# Patient Record
Sex: Female | Born: 1955 | Race: White | Hispanic: No | Marital: Married
Health system: Midwestern US, Community
[De-identification: ages and names within clinical notes are randomized; demographics above are authoritative.]

## PROBLEM LIST (undated history)

## (undated) DIAGNOSIS — K769 Liver disease, unspecified: Secondary | ICD-10-CM

## (undated) DIAGNOSIS — C7931 Secondary malignant neoplasm of brain: Secondary | ICD-10-CM

## (undated) DIAGNOSIS — C439 Malignant melanoma of skin, unspecified: Secondary | ICD-10-CM

## (undated) DIAGNOSIS — I10 Essential (primary) hypertension: Secondary | ICD-10-CM

## (undated) DIAGNOSIS — S83282A Other tear of lateral meniscus, current injury, left knee, initial encounter: Secondary | ICD-10-CM

## (undated) DIAGNOSIS — M545 Low back pain: Secondary | ICD-10-CM

## (undated) DIAGNOSIS — R1010 Upper abdominal pain, unspecified: Secondary | ICD-10-CM

## (undated) DIAGNOSIS — F329 Major depressive disorder, single episode, unspecified: Secondary | ICD-10-CM

## (undated) DIAGNOSIS — K250 Acute gastric ulcer with hemorrhage: Secondary | ICD-10-CM

## (undated) DIAGNOSIS — D649 Anemia, unspecified: Secondary | ICD-10-CM

## (undated) DIAGNOSIS — Z9884 Bariatric surgery status: Secondary | ICD-10-CM

## (undated) DIAGNOSIS — Z87442 Personal history of urinary calculi: Secondary | ICD-10-CM

## (undated) DIAGNOSIS — F411 Generalized anxiety disorder: Secondary | ICD-10-CM

## (undated) DIAGNOSIS — F32A Depression, unspecified: Secondary | ICD-10-CM

## (undated) DIAGNOSIS — M1990 Unspecified osteoarthritis, unspecified site: Secondary | ICD-10-CM

## (undated) DIAGNOSIS — N200 Calculus of kidney: Secondary | ICD-10-CM

## (undated) DIAGNOSIS — Z8719 Personal history of other diseases of the digestive system: Secondary | ICD-10-CM

## (undated) DIAGNOSIS — B019 Varicella without complication: Secondary | ICD-10-CM

## (undated) DIAGNOSIS — D509 Iron deficiency anemia, unspecified: Secondary | ICD-10-CM

## (undated) DIAGNOSIS — Z9289 Personal history of other medical treatment: Secondary | ICD-10-CM

## (undated) DIAGNOSIS — E669 Obesity, unspecified: Secondary | ICD-10-CM

## (undated) DIAGNOSIS — R12 Heartburn: Secondary | ICD-10-CM

## (undated) HISTORY — PX: CERVICAL SPINE SURGERY: SHX589

## (undated) HISTORY — DX: Personal history of other diseases of the digestive system: Z87.19

## (undated) HISTORY — DX: Personal history of other medical treatment: Z92.89

## (undated) HISTORY — DX: Unspecified osteoarthritis, unspecified site: M19.90

## (undated) HISTORY — DX: Varicella without complication: B01.9

## (undated) HISTORY — DX: Calculus of kidney: N20.0

## (undated) HISTORY — PX: OTHER SURGICAL HISTORY: SHX169

## (undated) HISTORY — PX: TONSILLECTOMY AND ADENOIDECTOMY: SUR1326

## (undated) HISTORY — PX: OOPHORECTOMY: SHX86

## (undated) HISTORY — PX: CHOLECYSTECTOMY: SHX55

## (undated) HISTORY — DX: Heartburn: R12

## (undated) HISTORY — DX: Depression, unspecified: F32.A

## (undated) HISTORY — DX: Morbid (severe) obesity due to excess calories: E66.01

## (undated) HISTORY — DX: Major depressive disorder, single episode, unspecified: F32.9

## (undated) HISTORY — DX: Iron deficiency anemia, unspecified: D50.9

---

## 1995-12-22 HISTORY — PX: ABDOMINAL HYSTERECTOMY: SHX81

## 2005-04-30 ENCOUNTER — Ambulatory Visit: Payer: Self-pay | Admitting: Internal Medicine

## 2005-05-13 ENCOUNTER — Ambulatory Visit: Payer: Self-pay | Admitting: Internal Medicine

## 2005-08-04 ENCOUNTER — Ambulatory Visit: Payer: Self-pay | Admitting: Internal Medicine

## 2005-08-10 ENCOUNTER — Ambulatory Visit: Payer: Self-pay | Admitting: Internal Medicine

## 2005-12-21 HISTORY — PX: GASTRIC BYPASS: SHX52

## 2006-04-14 ENCOUNTER — Encounter: Admission: RE | Admit: 2006-04-14 | Discharge: 2006-05-20 | Payer: Self-pay | Admitting: *Deleted

## 2006-07-09 ENCOUNTER — Encounter: Admission: RE | Admit: 2006-07-09 | Discharge: 2006-07-09 | Payer: Self-pay | Admitting: *Deleted

## 2007-02-25 ENCOUNTER — Ambulatory Visit: Payer: Self-pay | Admitting: Internal Medicine

## 2007-04-22 ENCOUNTER — Ambulatory Visit: Payer: Self-pay | Admitting: General Surgery

## 2007-04-22 HISTORY — PX: COLONOSCOPY: SHX174

## 2007-04-28 ENCOUNTER — Ambulatory Visit: Payer: Self-pay | Admitting: Internal Medicine

## 2007-12-22 HISTORY — PX: CERVICAL SPINE SURGERY: SHX589

## 2008-05-11 ENCOUNTER — Emergency Department: Payer: Self-pay | Admitting: Emergency Medicine

## 2009-07-31 ENCOUNTER — Inpatient Hospital Stay: Payer: Self-pay | Admitting: General Surgery

## 2009-09-20 ENCOUNTER — Emergency Department: Payer: Self-pay

## 2009-12-21 HISTORY — PX: HERNIA REPAIR: SHX51

## 2010-12-27 ENCOUNTER — Ambulatory Visit: Payer: Self-pay | Admitting: General Practice

## 2010-12-31 ENCOUNTER — Ambulatory Visit (HOSPITAL_COMMUNITY)
Admission: RE | Admit: 2010-12-31 | Discharge: 2011-01-01 | Payer: Self-pay | Source: Home / Self Care | Attending: Neurosurgery | Admitting: Neurosurgery

## 2011-01-05 LAB — BASIC METABOLIC PANEL
BUN: 11 mg/dL (ref 6–23)
CO2: 28 mEq/L (ref 19–32)
Calcium: 9.2 mg/dL (ref 8.4–10.5)
Chloride: 106 mEq/L (ref 96–112)
Creatinine, Ser: 0.8 mg/dL (ref 0.4–1.2)
GFR calc Af Amer: >60 mL/min (ref >60)
GFR calc non Af Amer: >60 mL/min (ref >60)
Glucose, Bld: 137 mg/dL — ABNORMAL HIGH (ref 70–99)
Potassium: 4.3 mEq/L (ref 3.5–5.1)
Sodium: 142 mEq/L (ref 135–145)

## 2011-01-05 LAB — CBC
HCT: 38.2 % (ref 36.0–46.0)
Hemoglobin: 12.8 g/dL (ref 12.0–15.0)
MCH: 30.7 pg (ref 26.0–34.0)
MCHC: 33.5 g/dL (ref 30.0–36.0)
MCV: 91.6 fL (ref 78.0–100.0)
Platelets: 198 10*3/uL (ref 150–400)
RBC: 4.17 MIL/uL (ref 3.87–5.11)
RDW: 13.4 % (ref 11.5–15.5)
WBC: 4.7 10*3/uL (ref 4.0–10.5)

## 2011-01-05 LAB — SURGICAL PCR SCREEN
MRSA, PCR: NEGATIVE
Staphylococcus aureus: NEGATIVE

## 2011-01-15 ENCOUNTER — Encounter
Admission: RE | Admit: 2011-01-15 | Discharge: 2011-01-15 | Payer: Self-pay | Source: Home / Self Care | Attending: Neurosurgery | Admitting: Neurosurgery

## 2011-01-15 NOTE — Op Note (Addendum)
[name], [name]                ACCOUNT NO.:  [id number]  MEDICAL RECORD NO.:  [id number]          PATIENT TYPE:  INP  LOCATION:  3528                         FACILITY:  [facility]  PHYSICIAN:  Donalee Citrin, M.D.        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  PROCEDURE:  Anterior cervical diskectomy and fusion at C3-4 using the Blackstone PEEK cage, packed with locally harvested autograft mixed with Actifuse and the Globus extend cervical plate with four 30-mm fixed angle screws.  SURGEON:  Donalee Citrin, MD  ASSISTANT:  Yetta Barre.  ANESTHESIA:  General endotracheal.  HISTORY OF PRESENT ILLNESS:  The patient is a very pleasant 55 year old female who has had neck and predominantly left shoulder and arm pain with numbness and weakness in her hands.  This has gotten progressively worse over the last several weeks to months.  MRI scan showed a very large disk herniation causing severe spinal cord compression at C3-4. This patient was recommended anterior cervical diskectomy and fusion.  I went over the risks and benefits of the operation with her, she understands, and agrees to proceed forward.  DESCRIPTION OF PROCEDURE:  The patient was brought to the OR, was placed under general anesthesia, positioned supine, and neck flexed in extension with 5 pounds of halter traction.  The right side of the neck was prepped and draped in usual sterile fashion.  Preop x-ray localized the appropriate level.  A curvilinear incision was made just off the midline to the anterior border of the sternocleidomastoid, and superficial layer of the platysma was dissected and divided longitudinally.  The avascular plane between the sternocleidomastoid and strap muscle was developed down to  the prevertebral fascia. Prevertebral fascia was dissected with Kitners.  Intraoperative x-ray identified the appropriate level, so longus colli was then reflected laterally and self-retaining retractor was placed.  Annulotomy was then extended with a 15-mm scalpel.  Anterior osteophytes were bitten off with 2- and 3-mm Kerrison punch.  Then a high-speed drill was used to drill down the disk space capturing the shavings and a mucus trap, and then drilling down posteriorly to the posterior annulus osteophyte complex.  A very large disk herniation, several free fragments were immediately expressed from underneath the posterior longitudinal ligament.  This allowed further access to further remove the PLL in a piecemeal fashion aggressively under biting the both endplates, also allowed further fragments to be fished out with a nerve hook from off the spinal cord.  First, decompressing the right C4 foramen marching across the left where the greatest amount of disk herniation and stenosis was achieved.  Further fragments were removed.  Some epiduralvenous bleeding was obtained on the proximal aspect of the nerve root, sleeve on the left C4.  This was packed with Gelfoam and easily controlled.  Aggressive under biting of the C3 vertebral body further allowed additional decompression achieved and final last fragment was removed.  The thecal  sac resumed its normal anatomic position with no compression.  After this, the endplates were scraped with a BA curette. After adequate meticulous hemostasis was maintained, the wound was copiously irrigated.  An Orthofix PEEK cage packed with local autograft mixed with Actifuse, measuring 8 x 11 x 11 mm was then inserted. Surgifoam was used to also control the hemostasis.  Then, a Globus plate was then placed.  All screws had excellent purchase.  Locking mechanism was engaged.  Then, the wound was copiously irrigated.  Meticulous hemostasis was maintained, and  the wound was closed in layers with interrupted Vicryl on platysma,  and a running 4-0 subcuticular, Benzoin and Steri-Strips were applied.  The patient went to recovery in stable condition.          ______________________________ Donalee Citrin, M.D.     GC/MEDQ  D:  12/31/2010  T:  01/01/2011  Job:  [id number]  Electronically Signed by Donalee Citrin M.D. on 01/15/2011 05:38:44 AM

## 2011-02-12 ENCOUNTER — Ambulatory Visit
Admission: RE | Admit: 2011-02-12 | Discharge: 2011-02-12 | Disposition: A | Discharge status: Home / Self Care | Payer: BC Managed Care – PPO | Source: Ambulatory Visit | Attending: Neurosurgery | Admitting: Neurosurgery

## 2011-02-12 ENCOUNTER — Other Ambulatory Visit: Payer: Self-pay | Admitting: Neurosurgery

## 2011-02-12 DIAGNOSIS — M542 Cervicalgia: Secondary | ICD-10-CM

## 2011-03-24 ENCOUNTER — Ambulatory Visit
Admission: RE | Admit: 2011-03-24 | Discharge: 2011-03-24 | Disposition: A | Discharge status: Home / Self Care | Payer: BC Managed Care – PPO | Source: Ambulatory Visit | Attending: Neurosurgery | Admitting: Neurosurgery

## 2011-03-24 ENCOUNTER — Other Ambulatory Visit: Payer: Self-pay | Admitting: Neurosurgery

## 2011-03-24 DIAGNOSIS — M542 Cervicalgia: Secondary | ICD-10-CM

## 2011-03-24 DIAGNOSIS — M5020 Other cervical disc displacement, unspecified cervical region: Secondary | ICD-10-CM

## 2011-03-24 DIAGNOSIS — M47812 Spondylosis without myelopathy or radiculopathy, cervical region: Secondary | ICD-10-CM

## 2011-03-24 DIAGNOSIS — M5030 Other cervical disc degeneration, unspecified cervical region: Secondary | ICD-10-CM

## 2011-08-25 ENCOUNTER — Emergency Department: Payer: Self-pay | Admitting: Emergency Medicine

## 2013-03-18 LAB — HEPATIC FUNCTION PANEL
ALT (SGPT): 20 IU/L (ref 0–32)
AST (SGOT): 34 IU/L (ref 0–40)
Albumin: 4.5 g/dL (ref 3.5–5.5)
Alk. phosphatase: 71 IU/L (ref 39–117)
Bilirubin, direct: 0.1 mg/dL (ref 0.00–0.40)
Bilirubin, total: 0.3 mg/dL (ref 0.0–1.2)
Protein, total: 6.9 g/dL (ref 6.0–8.5)

## 2013-03-22 NOTE — Telephone Encounter (Signed)
I called pt with normal LFTs.  Liver enzymes were elevated previously.  Pt also received copy of her labs.  db

## 2013-03-22 NOTE — Telephone Encounter (Signed)
Pt had labs on 03/17/13 and calling for results.  Labs were one month recheck. I printed copy of February labs.

## 2013-08-11 LAB — AMB POC URINALYSIS DIP STICK AUTO W/O MICRO
Bilirubin (UA POC): NEGATIVE
Blood (UA POC): NEGATIVE
Glucose (UA POC): NEGATIVE
Ketones (UA POC): NEGATIVE
Leukocyte esterase (UA POC): NEGATIVE
Nitrites (UA POC): NEGATIVE
Protein (UA POC): NEGATIVE mg/dL
Specific gravity (UA POC): 1.02 (ref 1.001–1.035)
Urobilinogen (UA POC): 0.2 (ref 0.2–1)
pH (UA POC): 7.5 (ref 4.6–8.0)

## 2013-08-12 LAB — CBC WITH AUTOMATED DIFF
ABS. BASOPHILS: 0 10*3/uL (ref 0.0–0.2)
ABS. EOSINOPHILS: 0.3 10*3/uL (ref 0.0–0.4)
ABS. IMM. GRANS.: 0 10*3/uL (ref 0.0–0.1)
ABS. MONOCYTES: 0.3 10*3/uL (ref 0.1–0.9)
ABS. NEUTROPHILS: 1.4 10*3/uL (ref 1.4–7.0)
Abs Lymphocytes: 1.7 10*3/uL (ref 0.7–3.1)
BASOPHILS: 0 % (ref 0–3)
EOSINOPHILS: 9 % — ABNORMAL HIGH (ref 0–5)
HCT: 37.2 % (ref 34.0–46.6)
HGB: 12.2 g/dL (ref 11.1–15.9)
IMMATURE GRANULOCYTES: 0 % (ref 0–2)
Lymphocytes: 45 % (ref 14–46)
MCH: 29 pg (ref 26.6–33.0)
MCHC: 32.8 g/dL (ref 31.5–35.7)
MCV: 88 fL (ref 79–97)
MONOCYTES: 8 % (ref 4–12)
NEUTROPHILS: 38 % — ABNORMAL LOW (ref 40–74)
PLATELET: 329 10*3/uL (ref 150–379)
RBC: 4.21 x10E6/uL (ref 3.77–5.28)
RDW: 15.1 % (ref 12.3–15.4)
WBC: 3.7 10*3/uL (ref 3.4–10.8)

## 2013-08-12 LAB — METABOLIC PANEL, COMPREHENSIVE
A-G Ratio: 2 (ref 1.1–2.5)
ALT (SGPT): 28 IU/L (ref 0–32)
AST (SGOT): 39 IU/L (ref 0–40)
Albumin: 4.3 g/dL (ref 3.5–5.5)
Alk. phosphatase: 59 IU/L (ref 39–117)
BUN/Creatinine ratio: 21 (ref 9–23)
BUN: 21 mg/dL (ref 6–24)
Bilirubin, total: 0.3 mg/dL (ref 0.0–1.2)
CO2: 26 mmol/L (ref 18–29)
Calcium: 8.8 mg/dL (ref 8.7–10.2)
Chloride: 102 mmol/L (ref 97–108)
Creatinine: 0.99 mg/dL (ref 0.57–1.00)
GFR est AA: 73 mL/min/{1.73_m2} (ref >59)
GFR est non-AA: 63 mL/min/{1.73_m2} (ref >59)
GLOBULIN, TOTAL: 2.1 g/dL (ref 1.5–4.5)
Glucose: 92 mg/dL (ref 65–99)
Potassium: 3.5 mmol/L (ref 3.5–5.2)
Protein, total: 6.4 g/dL (ref 6.0–8.5)
Sodium: 143 mmol/L (ref 134–144)

## 2013-08-12 LAB — CVD REPORT: PDF IMAGE: 0

## 2013-08-12 LAB — LIPID PANEL WITH LDL/HDL RATIO
Cholesterol, total: 169 mg/dL (ref 100–199)
HDL Cholesterol: 83 mg/dL (ref >39)
LDL, calculated: 75 mg/dL (ref 0–99)
LDL/HDL Ratio: 0.9 ratio units (ref 0.0–3.2)
Triglyceride: 53 mg/dL (ref 0–149)
VLDL, calculated: 11 mg/dL (ref 5–40)

## 2013-08-12 LAB — TSH 3RD GENERATION: TSH: 2.92 u[IU]/mL (ref 0.450–4.500)

## 2013-08-18 NOTE — Progress Notes (Signed)
Katelyn [name] is a 57 y.o. female who presents with   Chief Complaint   Patient presents with   ??? Complete Physical     Patient sees GYN for pap and mammogram, bone density. Pt has osteopenia. Patient had Tdap about 2 years ago. Patient states no complaints       History of Present Illness  Here for cpx. Takes 1/2 80mg  Zocor and doing well without side effect.  Has 10 grandchildren.  Line dancing for exercise.  Cramps in legs after at times.  Tdap 81mo ago.    Review of Systems  Review of Systems   Constitutional: Negative for fever, chills and malaise/fatigue.   HENT: Negative for hearing loss and congestion.    Eyes: Negative for blurred vision and pain.   Respiratory: Negative for cough, hemoptysis and shortness of breath.    Cardiovascular: Negative for chest pain, palpitations and leg swelling.   Gastrointestinal: Negative for heartburn, nausea, abdominal pain, diarrhea, constipation and blood in stool.   Genitourinary: Negative for dysuria, urgency, frequency and hematuria.   Musculoskeletal: Positive for myalgias (occas leg cramps). Negative for joint pain.   Neurological: Negative for dizziness, sensory change and headaches.   Endo/Heme/Allergies: Negative for polydipsia. Does not bruise/bleed easily.   Psychiatric/Behavioral: Negative for depression. The patient is not nervous/anxious and does not have insomnia.        Medications  Current Outpatient Prescriptions   Medication Sig   ??? hydrOXYzine (ATARAX) 25 mg tablet    ??? simvastatin (ZOCOR) 80 mg tablet Take 1 tablet by mouth nightly for 90 days.   ??? losartan-hydrochlorothiazide (HYZAAR) 100-25 mg per tablet Take 1 tablet by mouth daily for 90 days.   ??? amlodipine (NORVASC) 10 mg tablet Take 1 tablet by mouth daily for 90 days.     No current facility-administered medications for this visit.       Past Medical History  Past Medical History   Diagnosis Date   ??? Hypercholesterolemia    ??? Hypertension    ??? Osteopenia    ??? Cancer      melanoma - back        Surgical History  Past Surgical History   Procedure Laterality Date   ??? Hx gyn       c-section x 5   ??? Hx colonoscopy            Family History  Family History   Problem Relation Age of Onset   ??? Hypertension Mother    ??? Elevated Lipids Mother    ??? Elevated Lipids Father    ??? Hypertension Father    ??? Arthritis-osteo Father      spine       Social History  History     Social History   ??? Marital Status: MARRIED     Spouse Name: N/A     Number of Children: N/A   ??? Years of Education: N/A     Occupational History   ??? Not on file.     Social History Main Topics   ??? Smoking status: Never Smoker    ??? Smokeless tobacco: Not on file   ??? Alcohol Use: Not on file   ??? Drug Use: Not on file   ??? Sexually Active: Not on file     Other Topics Concern   ??? Not on file     Social History Narrative   ??? No narrative on file       History  Smoking status   ??? Never Smoker    Smokeless tobacco   ??? Not on file       Allergies  No Known Allergies    Vital Signs  BP 120/70   Ht 5\' 6"  (1.676 m)   Wt 145 lb (65.772 kg)   BMI 23.41 kg/m2  Body mass index is 23.41 kg/(m^2).    Physical Exam  Physical Exam   Constitutional: She is oriented to person, place, and time and well-developed, well-nourished, and in no distress.   HENT:   Head: Normocephalic.   Right Ear: Tympanic membrane normal.   Left Ear: Tympanic membrane normal.   Nose: Right sinus exhibits no maxillary sinus tenderness. Left sinus exhibits no maxillary sinus tenderness.   Mouth/Throat: Mucous membranes are normal. No oropharyngeal exudate.   Neck: No JVD present. No thyromegaly present.   Cardiovascular: Normal rate, regular rhythm, normal heart sounds and normal pulses.  Exam reveals no gallop and no friction rub.    No murmur heard.  Pulmonary/Chest: Breath sounds normal. No respiratory distress. She has no wheezes. She has no rales. Right breast exhibits no inverted nipple, no mass, no nipple discharge, no skin change and no tenderness. Left breast exhibits no inverted nipple,  no mass, no nipple discharge, no skin change and no tenderness. Breasts are symmetrical.   Abdominal: Soft. Bowel sounds are normal. She exhibits no distension and no mass. There is no tenderness. There is no rebound and no guarding.   Musculoskeletal: Normal range of motion. She exhibits no edema and no tenderness.   Lymphadenopathy:     She has no cervical adenopathy.   Neurological: She is alert and oriented to person, place, and time. She has normal reflexes.   Skin: Skin is warm and dry. No rash noted.   Psychiatric: Affect normal.         Assessment and Plan  Delecia was seen today for complete physical.    Diagnoses and associated orders for this visit:    Preventative health care    Hyperlipidemia  - simvastatin (ZOCOR) 80 mg tablet; Take 1 tablet by mouth nightly for 90 days.    HTN (hypertension)  - losartan-hydrochlorothiazide (HYZAAR) 100-25 mg per tablet; Take 1 tablet by mouth daily for 90 days.  - amlodipine (NORVASC) 10 mg tablet; Take 1 tablet by mouth daily for 90 days.    Other Orders  - Discontinue: simvastatin (ZOCOR) 80 mg tablet; Take 80 mg by mouth nightly.  - Discontinue: losartan-hydrochlorothiazide (HYZAAR) 100-25 mg per tablet; Take 1 tablet by mouth daily.  - Discontinue: amlodipine (NORVASC) 10 mg tablet;   - hydrOXYzine (ATARAX) 25 mg tablet;         AG given  May try decreasing Zocor to 20mg  to see if leg cramps improve.  Continue current meds  Call if symptoms worsen  Diet exercise reviewed  Recheck 6 mo

## 2014-02-20 MED ORDER — AMLODIPINE 10 MG TAB
10 mg | ORAL_TABLET | Freq: Every day | ORAL | Status: AC
Start: 2014-02-20 — End: 2014-05-21

## 2014-02-20 MED ORDER — SIMVASTATIN 80 MG TAB
80 mg | ORAL_TABLET | Freq: Every evening | ORAL | Status: AC
Start: 2014-02-20 — End: 2014-05-21

## 2014-02-20 MED ORDER — LOSARTAN-HYDROCHLOROTHIAZIDE 100 MG-25 MG TAB
100-25 mg | ORAL_TABLET | Freq: Every day | ORAL | Status: AC
Start: 2014-02-20 — End: 2014-05-21

## 2014-02-20 NOTE — Progress Notes (Signed)
Katelyn [name] is a 58 y.o. female who presents with   Chief Complaint   Patient presents with   ??? Cholesterol Problem     36-month check   ??? Hypertension     63-month check       History of Present Illness    BP has been doing well at home.  No CP or SOB.  No headaches or edema.  No side effects with medications.  Exercising regularly.  Occas leg cramps.  Taking 1/4 Zocor 80mg .    Review of Systems  Review of Systems   Constitutional: Negative for fever, chills and malaise/fatigue.   HENT: Negative for congestion and hearing loss.    Eyes: Negative for blurred vision and pain.   Respiratory: Negative for cough, hemoptysis and shortness of breath.    Cardiovascular: Negative for chest pain, palpitations and leg swelling.   Gastrointestinal: Negative for heartburn, nausea, abdominal pain, diarrhea, constipation and blood in stool.   Genitourinary: Negative for dysuria, urgency, frequency and hematuria.   Musculoskeletal: Negative for myalgias and joint pain.   Neurological: Negative for dizziness, sensory change and headaches.   Endo/Heme/Allergies: Negative for polydipsia. Does not bruise/bleed easily.   Psychiatric/Behavioral: Negative for depression. The patient is not nervous/anxious and does not have insomnia.        Medications  Current Outpatient Prescriptions   Medication Sig   ??? simvastatin (ZOCOR) 80 mg tablet Take 0.5 Tabs by mouth nightly for 90 days.   ??? losartan-hydrochlorothiazide (HYZAAR) 100-25 mg per tablet Take 1 Tab by mouth daily for 90 days.   ??? amLODIPine (NORVASC) 10 mg tablet Take 1 Tab by mouth daily for 90 days.   ??? hydrOXYzine (ATARAX) 25 mg tablet      No current facility-administered medications for this visit.       Past Medical History  Past Medical History   Diagnosis Date   ??? Hypercholesterolemia    ??? Hypertension    ??? Osteopenia    ??? Cancer      melanoma - back       Surgical History  Past Surgical History   Procedure Laterality Date   ??? Hx gyn       c-section x 5   ??? Hx colonoscopy             Family History  Family History   Problem Relation Age of Onset   ??? Hypertension Mother    ??? Elevated Lipids Mother    ??? Elevated Lipids Father    ??? Hypertension Father    ??? Arthritis-osteo Father      spine       Social History  History     Social History   ??? Marital Status: MARRIED     Spouse Name: N/A     Number of Children: N/A   ??? Years of Education: N/A     Occupational History   ??? Not on file.     Social History Main Topics   ??? Smoking status: Never Smoker    ??? Smokeless tobacco: Never Used   ??? Alcohol Use: No   ??? Drug Use: Not on file   ??? Sexual Activity: Not on file     Other Topics Concern   ??? Not on file     Social History Narrative       History   Smoking status   ??? Never Smoker    Smokeless tobacco   ??? Never Used  Allergies  No Known Allergies    Vital Signs  BP 116/78    Pulse 95    Wt 144 lb 9.6 oz (65.59 kg)    SpO2 97%   Body mass index is 23.35 kg/(m^2).    Physical Exam    Physical Exam   Constitutional: She is well-developed, well-nourished, and in no distress.   HENT:   Right Ear: Tympanic membrane normal.   Left Ear: Tympanic membrane normal.   Mouth/Throat: No oropharyngeal exudate.   Neck: No thyromegaly present.   Cardiovascular: Normal rate, regular rhythm, normal heart sounds and normal pulses.  Exam reveals no gallop and no friction rub.    No murmur heard.  Pulmonary/Chest: Breath sounds normal. No respiratory distress. She has no wheezes. She has no rales.   Musculoskeletal: She exhibits no edema.   Lymphadenopathy:     She has no cervical adenopathy.   Skin: Skin is warm and dry.       Assessment and Plan  Daurice was seen today for cholesterol problem and hypertension.    Diagnoses and associated orders for this visit:    Hyperlipidemia  - simvastatin (ZOCOR) 80 mg tablet; Take 0.5 Tabs by mouth nightly for 90 days.  - Lipid Panel with LDL/HDL  - CMP  - Venipuncture    HTN (hypertension)  - losartan-hydrochlorothiazide (HYZAAR) 100-25 mg per tablet; Take 1 Tab by mouth  daily for 90 days.  - amLODIPine (NORVASC) 10 mg tablet; Take 1 Tab by mouth daily for 90 days.  - CMP      Continue current meds  Call if symptoms worsen  Diet exercise reviewed  Recheck 6 months

## 2014-02-21 LAB — METABOLIC PANEL, COMPREHENSIVE
A-G Ratio: 2 (ref 1.1–2.5)
ALT (SGPT): 22 IU/L (ref 0–32)
AST (SGOT): 34 IU/L (ref 0–40)
Albumin: 4.6 g/dL (ref 3.5–5.5)
Alk. phosphatase: 65 IU/L (ref 39–117)
BUN/Creatinine ratio: 25 — ABNORMAL HIGH (ref 9–23)
BUN: 26 mg/dL — ABNORMAL HIGH (ref 6–24)
Bilirubin, total: 0.3 mg/dL (ref 0.0–1.2)
CO2: 26 mmol/L (ref 18–29)
Calcium: 9.3 mg/dL (ref 8.7–10.2)
Chloride: 101 mmol/L (ref 97–108)
Creatinine: 1.05 mg/dL — ABNORMAL HIGH (ref 0.57–1.00)
GFR est AA: 68 mL/min/{1.73_m2} (ref >59)
GFR est non-AA: 59 mL/min/{1.73_m2} — ABNORMAL LOW (ref >59)
GLOBULIN, TOTAL: 2.3 g/dL (ref 1.5–4.5)
Glucose: 86 mg/dL (ref 65–99)
Potassium: 3.7 mmol/L (ref 3.5–5.2)
Protein, total: 6.9 g/dL (ref 6.0–8.5)
Sodium: 144 mmol/L (ref 134–144)

## 2014-02-21 LAB — LIPID PANEL WITH LDL/HDL RATIO
Cholesterol, total: 193 mg/dL (ref 100–199)
HDL Cholesterol: 90 mg/dL (ref >39)
LDL, calculated: 90 mg/dL (ref 0–99)
LDL/HDL Ratio: 1 ratio units (ref 0.0–3.2)
Triglyceride: 65 mg/dL (ref 0–149)
VLDL, calculated: 13 mg/dL (ref 5–40)

## 2014-02-21 NOTE — Progress Notes (Signed)
Quick Note:    Patient notified of results and encouraged to push fluids.  ______

## 2014-05-10 MED ORDER — AZITHROMYCIN 250 MG TAB
250 mg | PACK | ORAL | Status: DC
Start: 2014-05-10 — End: 2014-08-17

## 2014-05-10 MED ORDER — METHYLPREDNISOLONE 4 MG TABS IN A DOSE PACK
4 mg | PACK | ORAL | Status: DC
Start: 2014-05-10 — End: 2014-08-17

## 2014-05-10 NOTE — Progress Notes (Signed)
Katelyn [name] is a 58 y.o. female who presents with   Chief Complaint   Patient presents with   ??? Sore Throat      x 2 days     ??? Cough     dry   ??? Cold Symptoms     slight       History of Present Illness  Tickle in throat x 1 mo. Increased ST and congestion x 2-3 days.  On Zyrtec D which helps somewhat.  Clear nasal drainage.    Review of Systems  Review of Systems   Constitutional: Negative for fever, chills and malaise/fatigue.   HENT: Negative for congestion and hearing loss.    Eyes: Negative for blurred vision, pain and discharge.   Respiratory: Negative for cough, hemoptysis, shortness of breath and wheezing.    Cardiovascular: Negative for chest pain, palpitations and leg swelling.   Gastrointestinal: Negative for heartburn, nausea, abdominal pain, diarrhea, constipation and blood in stool.   Genitourinary: Negative for dysuria, urgency, frequency and hematuria.   Musculoskeletal: Negative for myalgias and joint pain.   Skin: Negative for rash.   Neurological: Negative for dizziness, sensory change and headaches.   Endo/Heme/Allergies: Negative for polydipsia. Does not bruise/bleed easily.   Psychiatric/Behavioral: Negative for depression. The patient is not nervous/anxious and does not have insomnia.        Medications  Current Outpatient Prescriptions   Medication Sig   ??? methylPREDNISolone (MEDROL, PAK,) 4 mg tablet Per dose pack instructions   ??? azithromycin (ZITHROMAX Z-PAK) 250 mg tablet Take two tablets today then one tablet daily   ??? simvastatin (ZOCOR) 80 mg tablet Take 0.5 Tabs by mouth nightly for 90 days.   ??? losartan-hydrochlorothiazide (HYZAAR) 100-25 mg per tablet Take 1 Tab by mouth daily for 90 days.   ??? amLODIPine (NORVASC) 10 mg tablet Take 1 Tab by mouth daily for 90 days.   ??? hydrOXYzine (ATARAX) 25 mg tablet      No current facility-administered medications for this visit.       Past Medical History  Past Medical History   Diagnosis Date   ??? Hypercholesterolemia    ??? Hypertension    ???  Osteopenia    ??? Cancer ([facility])      melanoma - back       Surgical History  Past Surgical History   Procedure Laterality Date   ??? Hx gyn       c-section x 5   ??? Hx colonoscopy            Family History  Family History   Problem Relation Age of Onset   ??? Hypertension Mother    ??? Elevated Lipids Mother    ??? Elevated Lipids Father    ??? Hypertension Father    ??? Arthritis-osteo Father      spine       Social History  History     Social History   ??? Marital Status: MARRIED     Spouse Name: N/A     Number of Children: N/A   ??? Years of Education: N/A     Occupational History   ??? Not on file.     Social History Main Topics   ??? Smoking status: Never Smoker    ??? Smokeless tobacco: Never Used   ??? Alcohol Use: No   ??? Drug Use: Not on file   ??? Sexual Activity: Not on file     Other Topics Concern   ??? Not  on file     Social History Narrative       History   Smoking status   ??? Never Smoker    Smokeless tobacco   ??? Never Used       Allergies  No Known Allergies    Vital Signs  BP 138/70   Pulse 72   Temp(Src) 98.6 ??F (37 ??C)   Wt 145 lb 3.2 oz (65.862 kg)   SpO2 98%  Body mass index is 23.45 kg/(m^2).    Physical Exam  Physical Exam   Constitutional: She is well-developed, well-nourished, and in no distress.   HENT:   Right Ear: Tympanic membrane normal.   Left Ear: Tympanic membrane normal.   Nose: Rhinorrhea (clear) present. Right sinus exhibits no maxillary sinus tenderness. Left sinus exhibits no maxillary sinus tenderness.   Mouth/Throat: Posterior oropharyngeal erythema present. No oropharyngeal exudate.   Neck: No thyromegaly present.   Cardiovascular: Normal rate, regular rhythm, normal heart sounds and normal pulses.  Exam reveals no gallop and no friction rub.    No murmur heard.  Pulmonary/Chest: Breath sounds normal. No respiratory distress. She has no wheezes. She has no rales.   Musculoskeletal: She exhibits no edema.   Lymphadenopathy:     She has no cervical adenopathy.   Skin: Skin is warm and dry.       Assessment and  Plan  Katelyn [name] was seen today for sore throat, cough and cold symptoms.    Diagnoses and associated orders for this visit:    Allergic rhinitis due to pollen    Essential hypertension    Other Orders  - methylPREDNISolone (MEDROL, PAK,) 4 mg tablet; Per dose pack instructions  - azithromycin (ZITHROMAX Z-PAK) 250 mg tablet; Take two tablets today then one tablet daily    Antibiotic only if sinus sxs worsen  Nasacort, Zyrtec

## 2014-06-01 NOTE — Telephone Encounter (Signed)
Reviewed side effects of meds with pt. She is dizzy at times and fatigued. Advised to take med at night and see if that helps. She will also take her pulse/BP and see if anything is elevated. If symptoms continue then make appt to come in.

## 2014-06-01 NOTE — Telephone Encounter (Signed)
Taking Nasacort and Zyrtec  Has a concern of side effect or interaction.  pls call to advise [phone]

## 2014-08-17 LAB — AMB POC RAPID STREP A: Group A Strep Ag: NEGATIVE

## 2014-08-17 MED ORDER — AZITHROMYCIN 250 MG TAB
250 mg | ORAL | Status: AC
Start: 2014-08-17 — End: 2014-08-22

## 2014-08-17 NOTE — Progress Notes (Signed)
HISTORY OF PRESENT ILLNESS  Katelyn [name] is a 58 y.o. female.  Allergic Rhinitis  The history is provided by the patient. This is a recurrent problem. Episode onset: last year. Episode frequency: current episode's onset noted last week. The problem has been gradually worsening. Pertinent negatives include no chest pain, no abdominal pain, no headaches and no shortness of breath. Nothing relieves the symptoms. Treatments tried: patient is currently using zyrtec and nasonex. The treatment provided mild relief.   Sore Throat   The history is provided by the patient. This is a new problem. The current episode started 2 days ago. Patient reports a subjective (patient is concerned re: symptoms secondary to taking care of her 68 week old grandchild.) fever - was not measured.Associated symptoms include congestion, ear discharge, plugged ear sensation and swollen glands. Pertinent negatives include no vomiting, no drooling, no ear pain, no headaches, no shortness of breath, no stridor and no stiff neck. She has tried nothing for the symptoms.       Review of Systems   Constitutional: Positive for malaise/fatigue.   HENT: Positive for congestion, ear discharge and sore throat. Negative for drooling, ear pain and nosebleeds.    Respiratory: Negative for shortness of breath and stridor.    Cardiovascular: Negative for chest pain and palpitations.   Gastrointestinal: Negative for heartburn, nausea, vomiting and abdominal pain.   Neurological: Negative for dizziness and headaches.       Physical Exam   Constitutional: She is oriented to person, place, and time. She appears well-developed and well-nourished.   HENT:   Right Ear: Tympanic membrane normal.   Left Ear: Tympanic membrane normal.   Nose: Mucosal edema and rhinorrhea present. Right sinus exhibits no maxillary sinus tenderness and no frontal sinus tenderness. Left sinus exhibits no maxillary sinus tenderness and no frontal sinus tenderness.    Mouth/Throat: Uvula is midline and mucous membranes are normal. Posterior oropharyngeal erythema present. No oropharyngeal exudate, posterior oropharyngeal edema or tonsillar abscesses.   Eyes: Conjunctivae are normal.   Neck: Neck supple.   Cardiovascular: Normal rate, regular rhythm, normal heart sounds and intact distal pulses.  Exam reveals no gallop and no friction rub.    No murmur heard.  Pulmonary/Chest: Effort normal and breath sounds normal. No stridor. No respiratory distress. She has no wheezes. She has no rales. She exhibits no tenderness.   Abdominal: Soft. There is no tenderness.   Lymphadenopathy:     She has cervical adenopathy.   Anterior cervical adenopathy noted   Neurological: She is alert and oriented to person, place, and time.   Skin: No rash noted.   Psychiatric: She has a normal mood and affect.   Vitals reviewed.    BP 138/78 mmHg   Pulse 80   Temp(Src) 99.1 ??F (37.3 ??C)   Ht 5\' 6"  (1.676 m)   Wt 142 lb (64.411 kg)   BMI 22.93 kg/m2    ASSESSMENT and PLAN    ICD-9-CM ICD-10-CM    1. Other seasonal allergic rhinitis 477.8 J30.2    2. Pharyngitis 462 J02.9 AMB POC RAPID STREP A      azithromycin (ZITHROMAX) 250 mg tablet     reviewed diet, exercise and weight control  reviewed medications and side effects in detail  Continue OTC allergy medication as directed  Start z-pack as directed.  Increase fluids and rest.

## 2014-08-29 MED ORDER — LOSARTAN-HYDROCHLOROTHIAZIDE 100 MG-25 MG TAB
100-25 mg | ORAL_TABLET | Freq: Every day | ORAL | Status: DC
Start: 2014-08-29 — End: 2015-03-01

## 2014-08-29 MED ORDER — SIMVASTATIN 40 MG TAB
40 mg | ORAL_TABLET | Freq: Every evening | ORAL | Status: DC
Start: 2014-08-29 — End: 2015-03-01

## 2014-08-29 MED ORDER — AMLODIPINE 10 MG TAB
10 mg | ORAL_TABLET | Freq: Every day | ORAL | Status: DC
Start: 2014-08-29 — End: 2015-03-01

## 2014-08-29 NOTE — Progress Notes (Signed)
Katelyn [name] is a 58 y.o. female who presents with   Chief Complaint   Patient presents with   ??? Cholesterol Problem     pt is fasting for labs   ??? Hypertension     checks BP at home occasionally, well-controlled       History of Present Illness    BP has been doing well at home.  No CP or SOB.  No headaches or edema.  No side effects with medications.  Exercising regularly.  On Zocor- no myalgias.  Exercising.      Review of Systems  Review of Systems   Constitutional: Negative for fever, chills and malaise/fatigue.   HENT: Negative for congestion and hearing loss.    Eyes: Negative for blurred vision, pain and discharge.   Respiratory: Negative for cough, hemoptysis, shortness of breath and wheezing.    Cardiovascular: Negative for chest pain, palpitations and leg swelling.   Gastrointestinal: Negative for heartburn, nausea, abdominal pain, diarrhea, constipation and blood in stool.   Genitourinary: Negative for dysuria, urgency, frequency and hematuria.   Musculoskeletal: Negative for myalgias and joint pain.   Skin: Negative for rash.   Neurological: Negative for dizziness, sensory change and headaches.   Endo/Heme/Allergies: Negative for polydipsia. Does not bruise/bleed easily.   Psychiatric/Behavioral: Negative for depression. The patient is not nervous/anxious and does not have insomnia.        Medications  Current Outpatient Prescriptions   Medication Sig   ??? aspirin delayed-release 81 mg tablet Take  by mouth daily.   ??? calcium-cholecalciferol, d3, (CALCIUM 600 + D) 600-125 mg-unit tab Take  by mouth two (2) times a day.   ??? OTHER Thrive mvi supplements   ??? amLODIPine (NORVASC) 10 mg tablet Take 1 Tab by mouth daily.   ??? simvastatin (ZOCOR) 40 mg tablet Take 0.5 Tabs by mouth nightly.   ??? losartan-hydrochlorothiazide (HYZAAR) 100-25 mg per tablet Take 1 Tab by mouth daily.   ??? cetirizine (ZYRTEC) 10 mg tablet Take  by mouth.   ??? mometasone (NASONEX) 50 mcg/actuation nasal spray 2 Sprays daily.    ??? hydrOXYzine (ATARAX) 25 mg tablet      No current facility-administered medications for this visit.       Past Medical History  Past Medical History   Diagnosis Date   ??? Hypercholesterolemia    ??? Hypertension    ??? Osteopenia    ??? Cancer ([place])      melanoma - back       Surgical History  Past Surgical History   Procedure Laterality Date   ??? Hx gyn       c-section x 5   ??? Hx colonoscopy            Family History  Family History   Problem Relation Age of Onset   ??? Hypertension Mother    ??? Elevated Lipids Mother    ??? Elevated Lipids Father    ??? Hypertension Father    ??? Arthritis-osteo Father      spine       Social History  History     Social History   ??? Marital Status: MARRIED     Spouse Name: N/A     Number of Children: N/A   ??? Years of Education: N/A     Occupational History   ??? Not on file.     Social History Main Topics   ??? Smoking status: Never Smoker    ??? Smokeless tobacco: Never Used   ???  Alcohol Use: No   ??? Drug Use: Not on file   ??? Sexual Activity: Not on file     Other Topics Concern   ??? Not on file     Social History Narrative       History   Smoking status   ??? Never Smoker    Smokeless tobacco   ??? Never Used       Allergies  No Known Allergies    Vital Signs  BP 126/78 mmHg   Pulse 82   Wt 143 lb 3.2 oz (64.955 kg)   SpO2 99%  Body mass index is 23.12 kg/(m^2).    Physical Exam  Physical Exam   Constitutional: She is well-developed, well-nourished, and in no distress.   HENT:   Right Ear: Tympanic membrane normal.   Left Ear: Tympanic membrane normal.   Mouth/Throat: No oropharyngeal exudate.   Neck: No thyromegaly present.   Cardiovascular: Normal rate, regular rhythm, normal heart sounds and normal pulses.  Exam reveals no gallop and no friction rub.    No murmur heard.  Pulmonary/Chest: Breath sounds normal. No respiratory distress. She has no wheezes. She has no rales.   Musculoskeletal: She exhibits no edema.   Lymphadenopathy:     She has no cervical adenopathy.   Skin: Skin is warm and dry.        Assessment and Plan  Katelyn [name] was seen today for cholesterol problem and hypertension.    Diagnoses and associated orders for this visit:    Essential hypertension  - amLODIPine (NORVASC) 10 mg tablet; Take 1 Tab by mouth daily.  - losartan-hydrochlorothiazide (HYZAAR) 100-25 mg per tablet; Take 1 Tab by mouth daily.  - METABOLIC PANEL, COMPREHENSIVE    Hyperlipidemia  - simvastatin (ZOCOR) 40 mg tablet; Take 0.5 Tabs by mouth nightly.  - LIPID PANEL WITH LDL/HDL RATIO  - METABOLIC PANEL, COMPREHENSIVE    Screening  - MAM MAMMO BI SCREENING DIGTL; Future      Continue current meds  Call if symptoms worsen  Diet exercise reviewed  Recheck 6 months

## 2014-08-29 NOTE — Addendum Note (Signed)
Addended by: Williams Che on: 08/29/2014 08:45 AM      Modules accepted: Orders

## 2014-08-30 LAB — METABOLIC PANEL, COMPREHENSIVE
A-G Ratio: 1.5 (ref 1.1–2.5)
ALT (SGPT): 21 IU/L (ref 0–32)
AST (SGOT): 29 IU/L (ref 0–40)
Albumin: 4.3 g/dL (ref 3.5–5.5)
Alk. phosphatase: 72 IU/L (ref 39–117)
BUN/Creatinine ratio: 12 (ref 9–23)
BUN: 12 mg/dL (ref 6–24)
Bilirubin, total: 0.2 mg/dL (ref 0.0–1.2)
CO2: 27 mmol/L (ref 18–29)
Calcium: 9.2 mg/dL (ref 8.7–10.2)
Chloride: 101 mmol/L (ref 97–108)
Creatinine: 1 mg/dL (ref 0.57–1.00)
GFR est AA: 72 mL/min/{1.73_m2} (ref >59)
GFR est non-AA: 62 mL/min/{1.73_m2} (ref >59)
GLOBULIN, TOTAL: 2.8 g/dL (ref 1.5–4.5)
Glucose: 91 mg/dL (ref 65–99)
Potassium: 3.9 mmol/L (ref 3.5–5.2)
Protein, total: 7.1 g/dL (ref 6.0–8.5)
Sodium: 143 mmol/L (ref 134–144)

## 2014-08-30 LAB — LIPID PANEL WITH LDL/HDL RATIO
Cholesterol, total: 171 mg/dL (ref 100–199)
HDL Cholesterol: 75 mg/dL (ref >39)
LDL, calculated: 82 mg/dL (ref 0–99)
LDL/HDL Ratio: 1.1 ratio units (ref 0.0–3.2)
Triglyceride: 70 mg/dL (ref 0–149)
VLDL, calculated: 14 mg/dL (ref 5–40)

## 2014-09-28 ENCOUNTER — Inpatient Hospital Stay: Admit: 2014-09-28 | Payer: PRIVATE HEALTH INSURANCE | Attending: Family Medicine | Primary: Family Medicine

## 2014-09-28 DIAGNOSIS — Z1231 Encounter for screening mammogram for malignant neoplasm of breast: Secondary | ICD-10-CM

## 2014-12-13 ENCOUNTER — Emergency Department: Payer: Self-pay | Admitting: Emergency Medicine

## 2014-12-13 LAB — URINALYSIS, COMPLETE
Bacteria: NONE SEEN
Bilirubin,UR: NEGATIVE
Blood: NEGATIVE
Glucose,UR: NEGATIVE mg/dL (ref 0–75)
Hyaline Cast: 3
Ketone: NEGATIVE
Nitrite: NEGATIVE
Ph: 6 (ref 4.5–8.0)
Protein: NEGATIVE
RBC,UR: NONE SEEN /HPF (ref 0–5)
Specific Gravity: 1.005 (ref 1.003–1.030)
Squamous Epithelial: <1
WBC UR: 2 /HPF (ref 0–5)

## 2014-12-13 LAB — CBC WITH DIFFERENTIAL/PLATELET
Basophil #: 0.1 10*3/uL (ref 0.0–0.1)
Basophil %: 0.7 %
Eosinophil #: 0.2 10*3/uL (ref 0.0–0.7)
Eosinophil %: 3 %
HCT: 29.1 % — ABNORMAL LOW (ref 35.0–47.0)
HGB: 8.5 g/dL — ABNORMAL LOW (ref 12.0–16.0)
Lymphocyte #: 1.3 10*3/uL (ref 1.0–3.6)
Lymphocyte %: 18.3 %
MCH: 20.5 pg — ABNORMAL LOW (ref 26.0–34.0)
MCHC: 29.3 g/dL — ABNORMAL LOW (ref 32.0–36.0)
MCV: 70 fL — ABNORMAL LOW (ref 80–100)
Monocyte #: 0.7 x10 3/mm (ref 0.2–0.9)
Monocyte %: 10.3 %
Neutrophil #: 4.8 10*3/uL (ref 1.4–6.5)
Neutrophil %: 67.7 %
Platelet: 275 10*3/uL (ref 150–440)
RBC: 4.15 10*6/uL (ref 3.80–5.20)
RDW: 18.7 % — ABNORMAL HIGH (ref 11.5–14.5)
WBC: 7.1 10*3/uL (ref 3.6–11.0)

## 2014-12-13 LAB — COMPREHENSIVE METABOLIC PANEL
Albumin: 3.6 g/dL (ref 3.4–5.0)
Alkaline Phosphatase: 107 U/L
Anion Gap: 8 (ref 7–16)
BUN: 11 mg/dL (ref 7–18)
Bilirubin,Total: 0.3 mg/dL (ref 0.2–1.0)
Calcium, Total: 8.4 mg/dL — ABNORMAL LOW (ref 8.5–10.1)
Chloride: 106 mmol/L (ref 98–107)
Co2: 27 mmol/L (ref 21–32)
Creatinine: 1.16 mg/dL (ref 0.60–1.30)
EGFR (African American): >60
EGFR (Non-African Amer.): 51 — ABNORMAL LOW
Glucose: 104 mg/dL — ABNORMAL HIGH (ref 65–99)
Osmolality: 281 (ref 275–301)
Potassium: 3.7 mmol/L (ref 3.5–5.1)
SGOT(AST): 30 U/L (ref 15–37)
SGPT (ALT): 15 U/L
Sodium: 141 mmol/L (ref 136–145)
Total Protein: 7.4 g/dL (ref 6.4–8.2)

## 2014-12-13 LAB — LIPASE, BLOOD: Lipase: 90 U/L (ref 73–393)

## 2014-12-14 LAB — CBC WITH DIFFERENTIAL/PLATELET
Basophil #: 0.1 10*3/uL (ref 0.0–0.1)
Basophil %: 0.9 %
Eosinophil #: 0.1 10*3/uL (ref 0.0–0.7)
Eosinophil %: 2.5 %
HCT: 29.4 % — ABNORMAL LOW (ref 35.0–47.0)
HGB: 8.8 g/dL — ABNORMAL LOW (ref 12.0–16.0)
Lymphocyte #: 2 10*3/uL (ref 1.0–3.6)
Lymphocyte %: 34.4 %
MCH: 20.8 pg — ABNORMAL LOW (ref 26.0–34.0)
MCHC: 29.9 g/dL — ABNORMAL LOW (ref 32.0–36.0)
MCV: 69 fL — ABNORMAL LOW (ref 80–100)
Monocyte #: 0.5 x10 3/mm (ref 0.2–0.9)
Monocyte %: 8.5 %
Neutrophil #: 3.1 10*3/uL (ref 1.4–6.5)
Neutrophil %: 53.7 %
Platelet: 300 10*3/uL (ref 150–440)
RBC: 4.24 10*6/uL (ref 3.80–5.20)
RDW: 18.8 % — ABNORMAL HIGH (ref 11.5–14.5)
WBC: 5.7 10*3/uL (ref 3.6–11.0)

## 2015-01-17 ENCOUNTER — Ambulatory Visit: Payer: Self-pay | Admitting: Urology

## 2015-03-01 ENCOUNTER — Ambulatory Visit
Admit: 2015-03-01 | Discharge: 2015-03-01 | Payer: BLUE CROSS/BLUE SHIELD | Attending: Family Medicine | Primary: Family Medicine

## 2015-03-01 ENCOUNTER — Ambulatory Visit
Admit: 2015-03-01 | Disposition: A | Discharge status: Home / Self Care | Payer: Self-pay | Attending: Internal Medicine | Admitting: Internal Medicine

## 2015-03-01 DIAGNOSIS — I10 Essential (primary) hypertension: Secondary | ICD-10-CM

## 2015-03-01 MED ORDER — LOSARTAN-HYDROCHLOROTHIAZIDE 100 MG-25 MG TAB
100-25 mg | ORAL_TABLET | Freq: Every day | ORAL | Status: DC
Start: 2015-03-01 — End: 2015-09-16

## 2015-03-01 MED ORDER — AMLODIPINE 10 MG TAB
10 mg | ORAL_TABLET | Freq: Every day | ORAL | Status: DC
Start: 2015-03-01 — End: 2015-09-30

## 2015-03-01 MED ORDER — SIMVASTATIN 40 MG TAB
40 mg | ORAL_TABLET | Freq: Every evening | ORAL | Status: DC
Start: 2015-03-01 — End: 2015-10-15

## 2015-03-01 NOTE — Progress Notes (Signed)
Katelyn [name] is a 59 y.o. female who presents with   Chief Complaint   Patient presents with   ??? Hypertension   ??? Cholesterol Problem       History of Present Illness    BP has been doing well at home.  No CP or SOB.  No headaches or edema.  No side effects with medications.  Exercising regularly.  Occas muscle cramps, but ok if drinking water.    Review of Systems  Review of Systems   Constitutional: Negative for fever, chills and malaise/fatigue.   HENT: Negative for congestion and hearing loss.    Eyes: Negative for blurred vision, pain and discharge.   Respiratory: Negative for cough, hemoptysis, shortness of breath and wheezing.    Cardiovascular: Negative for chest pain, palpitations and leg swelling.   Gastrointestinal: Negative for heartburn, nausea, abdominal pain, diarrhea, constipation and blood in stool.   Genitourinary: Negative for dysuria, urgency, frequency and hematuria.   Musculoskeletal: Negative for myalgias and joint pain.   Skin: Negative for rash.   Neurological: Negative for dizziness, sensory change and headaches.   Endo/Heme/Allergies: Negative for polydipsia. Does not bruise/bleed easily.   Psychiatric/Behavioral: Negative for depression. The patient is not nervous/anxious and does not have insomnia.        Medications  Current Outpatient Prescriptions   Medication Sig   ??? fluticasone (FLONASE) 50 mcg/actuation nasal spray 2 Sprays by Both Nostrils route once.   ??? amLODIPine (NORVASC) 10 mg tablet Take 1 Tab by mouth daily.   ??? simvastatin (ZOCOR) 40 mg tablet Take 0.5 Tabs by mouth nightly.   ??? losartan-hydrochlorothiazide (HYZAAR) 100-25 mg per tablet Take 1 Tab by mouth daily.   ??? aspirin delayed-release 81 mg tablet Take  by mouth daily.   ??? calcium-cholecalciferol, d3, (CALCIUM 600 + D) 600-125 mg-unit tab Take  by mouth two (2) times a day.   ??? OTHER Thrive mvi supplements   ??? cetirizine (ZYRTEC) 10 mg tablet Take  by mouth.   ??? hydrOXYzine (ATARAX) 25 mg tablet       No current facility-administered medications for this visit.       Past Medical History  Past Medical History   Diagnosis Date   ??? Hypercholesterolemia    ??? Hypertension    ??? Osteopenia    ??? Cancer ([facility])      melanoma - back       Surgical History  Past Surgical History   Procedure Laterality Date   ??? Hx gyn       c-section x 5   ??? Hx colonoscopy            Family History  Family History   Problem Relation Age of Onset   ??? Hypertension Mother    ??? Elevated Lipids Mother    ??? Elevated Lipids Father    ??? Hypertension Father    ??? Arthritis-osteo Father      spine   ??? Heart Disease Father    ??? Breast Cancer Other        Social History  History     Social History   ??? Marital Status: MARRIED     Spouse Name: N/A   ??? Number of Children: N/A   ??? Years of Education: N/A     Occupational History   ??? Not on file.     Social History Main Topics   ??? Smoking status: Never Smoker    ??? Smokeless tobacco: Never Used   ???  Alcohol Use: No   ??? Drug Use: Not on file   ??? Sexual Activity: Not on file     Other Topics Concern   ??? Not on file     Social History Narrative       History   Smoking status   ??? Never Smoker    Smokeless tobacco   ??? Never Used       Allergies  No Known Allergies    Vital Signs  BP 142/82 mmHg   Pulse 67   Wt 143 lb 3.2 oz (64.955 kg)   SpO2 99%  Body mass index is 23.12 kg/(m^2).    Physical Exam  Physical Exam   Constitutional: She is well-developed, well-nourished, and in no distress.   HENT:   Right Ear: Tympanic membrane normal.   Left Ear: Tympanic membrane normal.   Mouth/Throat: No oropharyngeal exudate.   Neck: No thyromegaly present.   Cardiovascular: Normal rate, regular rhythm, normal heart sounds and normal pulses.  Exam reveals no gallop and no friction rub.    No murmur heard.  Pulmonary/Chest: Breath sounds normal. No respiratory distress. She has no wheezes. She has no rales.   Musculoskeletal: She exhibits no edema.   Lymphadenopathy:     She has no cervical adenopathy.    Skin: Skin is warm and dry.       Assessment and Plan  Karyssa was seen today for hypertension and cholesterol problem.    Diagnoses and all orders for this visit:    Essential hypertension  Orders:  -     amLODIPine (NORVASC) 10 mg tablet; Take 1 Tab by mouth daily.  -     losartan-hydrochlorothiazide (HYZAAR) 100-25 mg per tablet; Take 1 Tab by mouth daily.    Hyperlipidemia, unspecified hyperlipidemia type  Orders:  -     simvastatin (ZOCOR) 40 mg tablet; Take 0.5 Tabs by mouth nightly.    Continue current meds  Call if symptoms worsen  Diet exercise reviewed  Recheck 6 mo for cpx

## 2015-03-02 LAB — METABOLIC PANEL, COMPREHENSIVE
A-G Ratio: 1.9 (ref 1.1–2.5)
ALT (SGPT): 20 IU/L (ref 0–32)
AST (SGOT): 31 IU/L (ref 0–40)
Albumin: 4.3 g/dL (ref 3.5–5.5)
Alk. phosphatase: 59 IU/L (ref 39–117)
BUN/Creatinine ratio: 18 (ref 9–23)
BUN: 18 mg/dL (ref 6–24)
Bilirubin, total: 0.2 mg/dL (ref 0.0–1.2)
CO2: 24 mmol/L (ref 18–29)
Calcium: 8.5 mg/dL — ABNORMAL LOW (ref 8.7–10.2)
Chloride: 102 mmol/L (ref 97–108)
Creatinine: 0.99 mg/dL (ref 0.57–1.00)
GFR est AA: 73 mL/min/{1.73_m2} (ref >59)
GFR est non-AA: 63 mL/min/{1.73_m2} (ref >59)
GLOBULIN, TOTAL: 2.3 g/dL (ref 1.5–4.5)
Glucose: 90 mg/dL (ref 65–99)
Potassium: 3.9 mmol/L (ref 3.5–5.2)
Protein, total: 6.6 g/dL (ref 6.0–8.5)
Sodium: 143 mmol/L (ref 134–144)

## 2015-03-02 LAB — LIPID PANEL WITH LDL/HDL RATIO
Cholesterol, total: 155 mg/dL (ref 100–199)
HDL Cholesterol: 74 mg/dL (ref >39)
LDL, calculated: 72 mg/dL (ref 0–99)
LDL/HDL Ratio: 1 ratio units (ref 0.0–3.2)
Triglyceride: 43 mg/dL (ref 0–149)
VLDL, calculated: 9 mg/dL (ref 5–40)

## 2015-03-06 NOTE — Progress Notes (Signed)
Quick Note:        Lab letter mailed 03/06/15 pl    ______

## 2015-03-22 ENCOUNTER — Ambulatory Visit
Admit: 2015-03-22 | Disposition: A | Discharge status: Home / Self Care | Payer: Self-pay | Attending: Internal Medicine | Admitting: Internal Medicine

## 2015-04-05 ENCOUNTER — Other Ambulatory Visit: Payer: Self-pay | Admitting: Internal Medicine

## 2015-04-05 DIAGNOSIS — Z1231 Encounter for screening mammogram for malignant neoplasm of breast: Secondary | ICD-10-CM

## 2015-04-22 ENCOUNTER — Ambulatory Visit: Payer: PRIVATE HEALTH INSURANCE | Attending: Internal Medicine

## 2015-05-20 ENCOUNTER — Other Ambulatory Visit: Payer: Self-pay | Admitting: *Deleted

## 2015-05-20 DIAGNOSIS — D509 Iron deficiency anemia, unspecified: Secondary | ICD-10-CM

## 2015-05-22 ENCOUNTER — Other Ambulatory Visit: Payer: Self-pay

## 2015-05-22 ENCOUNTER — Ambulatory Visit: Payer: Self-pay

## 2015-05-22 ENCOUNTER — Ambulatory Visit: Payer: Self-pay | Admitting: Internal Medicine

## 2015-05-23 ENCOUNTER — Inpatient Hospital Stay: Payer: No Typology Code available for payment source | Attending: Internal Medicine

## 2015-05-23 DIAGNOSIS — Z9884 Bariatric surgery status: Secondary | ICD-10-CM | POA: Insufficient documentation

## 2015-05-23 DIAGNOSIS — E538 Deficiency of other specified B group vitamins: Secondary | ICD-10-CM | POA: Insufficient documentation

## 2015-05-23 DIAGNOSIS — R5382 Chronic fatigue, unspecified: Secondary | ICD-10-CM | POA: Insufficient documentation

## 2015-05-23 DIAGNOSIS — Z803 Family history of malignant neoplasm of breast: Secondary | ICD-10-CM | POA: Insufficient documentation

## 2015-05-23 DIAGNOSIS — Z801 Family history of malignant neoplasm of trachea, bronchus and lung: Secondary | ICD-10-CM | POA: Diagnosis not present

## 2015-05-23 DIAGNOSIS — K219 Gastro-esophageal reflux disease without esophagitis: Secondary | ICD-10-CM | POA: Insufficient documentation

## 2015-05-23 DIAGNOSIS — Z800 Family history of malignant neoplasm of digestive organs: Secondary | ICD-10-CM | POA: Diagnosis not present

## 2015-05-23 DIAGNOSIS — D509 Iron deficiency anemia, unspecified: Secondary | ICD-10-CM | POA: Diagnosis not present

## 2015-05-23 DIAGNOSIS — M4690 Unspecified inflammatory spondylopathy, site unspecified: Secondary | ICD-10-CM | POA: Diagnosis not present

## 2015-05-23 LAB — IRON AND TIBC
Iron: 44 ug/dL (ref 28–170)
Saturation Ratios: 12 % (ref 10.4–31.8)
TIBC: 361 ug/dL (ref 250–450)
UIBC: 317 ug/dL

## 2015-05-23 LAB — CBC WITH DIFFERENTIAL/PLATELET
Basophils Absolute: 0.1 10*3/uL (ref 0–0.1)
Basophils Relative: 1 %
Eosinophils Absolute: 0.1 10*3/uL (ref 0–0.7)
Eosinophils Relative: 2 %
HCT: 37.6 % (ref 35.0–47.0)
Hemoglobin: 12 g/dL (ref 12.0–16.0)
Lymphocytes Relative: 29 %
Lymphs Abs: 1.3 10*3/uL (ref 1.0–3.6)
MCH: 28.2 pg (ref 26.0–34.0)
MCHC: 31.9 g/dL — ABNORMAL LOW (ref 32.0–36.0)
MCV: 88.5 fL (ref 80.0–100.0)
Monocytes Absolute: 0.5 10*3/uL (ref 0.2–0.9)
Monocytes Relative: 11 %
Neutro Abs: 2.4 10*3/uL (ref 1.4–6.5)
Neutrophils Relative %: 57 %
Platelets: 193 10*3/uL (ref 150–440)
RBC: 4.25 MIL/uL (ref 3.80–5.20)
RDW: 20.1 % — ABNORMAL HIGH (ref 11.5–14.5)
WBC: 4.4 10*3/uL (ref 3.6–11.0)

## 2015-05-23 LAB — VITAMIN B12: Vitamin B-12: 157 pg/mL — ABNORMAL LOW (ref 180–914)

## 2015-05-23 LAB — FOLATE: Folate: 16.5 ng/mL (ref >5.9)

## 2015-05-23 LAB — FERRITIN: Ferritin: 34 ng/mL (ref 11–307)

## 2015-05-27 ENCOUNTER — Other Ambulatory Visit: Payer: Self-pay

## 2015-05-27 ENCOUNTER — Inpatient Hospital Stay: Payer: No Typology Code available for payment source

## 2015-05-27 ENCOUNTER — Inpatient Hospital Stay (HOSPITAL_BASED_OUTPATIENT_CLINIC_OR_DEPARTMENT_OTHER): Payer: No Typology Code available for payment source | Admitting: Internal Medicine

## 2015-05-27 ENCOUNTER — Encounter (INDEPENDENT_AMBULATORY_CARE_PROVIDER_SITE_OTHER): Payer: Self-pay

## 2015-05-27 VITALS — BP 158/95 | HR 73 | Temp 95.5°F | Resp 18 | Ht 63.0 in | Wt 218.3 lb

## 2015-05-27 DIAGNOSIS — D509 Iron deficiency anemia, unspecified: Secondary | ICD-10-CM

## 2015-05-27 DIAGNOSIS — M4690 Unspecified inflammatory spondylopathy, site unspecified: Secondary | ICD-10-CM

## 2015-05-27 DIAGNOSIS — R5382 Chronic fatigue, unspecified: Secondary | ICD-10-CM | POA: Diagnosis not present

## 2015-05-27 DIAGNOSIS — Z801 Family history of malignant neoplasm of trachea, bronchus and lung: Secondary | ICD-10-CM

## 2015-05-27 DIAGNOSIS — Z9884 Bariatric surgery status: Secondary | ICD-10-CM

## 2015-05-27 DIAGNOSIS — Z800 Family history of malignant neoplasm of digestive organs: Secondary | ICD-10-CM

## 2015-05-27 DIAGNOSIS — E538 Deficiency of other specified B group vitamins: Secondary | ICD-10-CM | POA: Diagnosis not present

## 2015-05-27 DIAGNOSIS — Z803 Family history of malignant neoplasm of breast: Secondary | ICD-10-CM

## 2015-05-27 DIAGNOSIS — K219 Gastro-esophageal reflux disease without esophagitis: Secondary | ICD-10-CM

## 2015-05-27 MED ORDER — CYANOCOBALAMIN 1000 MCG/ML IJ SOLN
1000.0000 ug | Freq: Once | INTRAMUSCULAR | Status: AC
  Administered 2015-05-27: 1000 ug via INTRAMUSCULAR
  Filled 2015-05-27: qty 1

## 2015-05-27 MED ORDER — IRON SUCROSE 20 MG/ML IV SOLN
200.0000 mg | Freq: Once | INTRAVENOUS | Status: AC
  Administered 2015-05-27: 200 mg via INTRAVENOUS
  Filled 2015-05-27: qty 10

## 2015-05-27 MED ORDER — SODIUM CHLORIDE 0.9 % IV SOLN
Freq: Once | INTRAVENOUS | Status: AC
  Administered 2015-05-27: 20 mL/h via INTRAVENOUS
  Filled 2015-05-27: qty 1000

## 2015-05-27 MED ORDER — SODIUM CHLORIDE 0.9 % IJ SOLN
3.0000 mL | Freq: Once | INTRAMUSCULAR | Status: DC | PRN
  Filled 2015-05-27: qty 10

## 2015-06-12 NOTE — Progress Notes (Signed)
[facility]  [phone] [phone]     ID: [name] OB: 1956/04/24  MR#: [id number]  RTJ#:[id number]  Patient Care Team: No Pcp Per Patient as PCP - General (General Practice)  CHIEF COMPLAINT/DIAGNOSIS:  1. 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 (labs on 02/26/15 reported hemoglobin 10.4, MCV 77, platelets 258, WBC 6400, 64% neutrophils, 18% lymphocytes, 15% monocytes, 2% eosinophils, 1% basophils, serum iron low at 20, TIBC 428, TIBC very low at 5%). 2. B12 deficiency - diagnosed June 2016.  HISTORY OF PRESENT ILLNESS:  Patient returns for hematology followup. She has received IV iron few months ago. States that it did make her feel better overall.  States that stools have been dark when she had taken oral iron, otherwise denies any definite bright red blood in stools, melena, or hematuria. No other bleeding symptoms either. She has noticed improvement in chronic fatigue on exertion, otherwise remains physically active and denies any dyspnea on exertion or at rest, orthopnea, PND, angina, palpitation, or dizziness. No new bone pains. No new paresthesias in extremities.   REVIEW OF SYSTEMS:   ROS As in HPI above. In addition, no fever, chills or sweats. No new headaches or focal weakness.  No new mood disturbances. No  sore throat, cough, shortness of breath, sputum, hemoptysis or chest pain. No dizziness or palpitation. No abdominal pain, constipation, diarrhea, dysuria or hematuria. No new skin rash or bleeding symptoms. No new paresthesias in extremities.   PAST MEDICAL HISTORY: Reviewed.         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 As above.  FAMILY HISTORY:Reviewed. Remarkable for diabetes. One sister had breast cancer and colon cancer. Father had lung cancer. Denies hematological disorders.  ADVANCED DIRECTIVES:  No - Patient declined  SOCIAL HISTORY: Reviewed. Denies smoking, alcohol or recreation drug usage. Physically active.   Allergies  Allergen Reactions  . Penicillins Swelling  . Sulfur Swelling    No current outpatient prescriptions on file.   No current facility-administered medications for this visit.    OBJECTIVE: Filed Vitals:   05/27/15 1056  BP:   Pulse:   Temp:   Resp: 18     Body mass index is 38.67 kg/(m^2).   GENERAL: Patient is alert and oriented and in no acute distress. There is no icterus or pallor. HEENT: EOMs intact. Oral exam negative for thrush or lesions.  CVS: S1S2, regular LUNGS: Bilaterally clear to auscultation, no rhonchi. ABDOMEN: Soft, nontender. No hepatosplenomegaly clinically.  EXTREMITIES: No pedal edema.   LAB RESULTS: 05/23/15 - ferritin 34, iron saturation 12%, serum Fe 44, B12 low at 157, Hb 12.0, WBC 4400, plts 193K.    Component Value Date/Time   NA 141 12/13/2014 2118   NA 142 12/31/2010 1316   K 3.7 12/13/2014 2118   K 4.3 12/31/2010 1316   CL 106 12/13/2014 2118   CL 106 12/31/2010 1316   CO2 27 12/13/2014 2118   CO2 28 12/31/2010 1316   GLUCOSE 104* 12/13/2014 2118   GLUCOSE 137* 12/31/2010 1316   BUN 11 12/13/2014 2118   BUN 11 12/31/2010 1316   CREATININE 1.16 12/13/2014 2118   CREATININE 0.80 12/31/2010 1316  CALCIUM 8.4* 12/13/2014 2118   CALCIUM 9.2 12/31/2010 1316   PROT 7.4 12/13/2014 2118   ALBUMIN 3.6 12/13/2014 2118   AST 30 12/13/2014 2118   ALT 15 12/13/2014 2118   ALKPHOS 107 12/13/2014 2118   GFRNONAA >60 12/31/2010 1316   GFRAA  12/31/2010 1316    >60        The eGFR has been calculated using the MDRD equation. This calculation has not  been validated in all clinical situations. eGFR's persistently <60 mL/min signify possible Chronic Kidney Disease.   Lab Results  Component Value Date   WBC 4.4 05/23/2015   NEUTROABS 2.4 05/23/2015   HGB 12.0 05/23/2015   HCT 37.6 05/23/2015   MCV 88.5 05/23/2015   PLT 193 05/23/2015    ASSESSMENT / PLAN:   1. 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 Patient has obvious iron-deficiency anemia, most likely explanation is that she is status post gastric bypass in 2009 and is unlikely to absorb oral iron adequately. In addition, patient is intolerant of oral iron and would therefore benefit from parenteral iron therapy. Hb and iron study is doing steady and in the low normal range. Plan is to pursue maintenance parenteral iron therapy with IV Venofer 200 mg once every 12 weeks. Will also monitor Hb and iron study q 12 weeks.Next MD f/u at 48 weeks with labs and make continued treatment planning. 2. B12 deficiency - started on B12 injections at PMD office. Will get IF antibody next lab draw.  3. In between visits, patient advised to call or come to ER in case of any new symptoms or acute sickness. She is agreeable to this plan.    Leia Alf, MD   06/12/2015 11:27 PM

## 2015-06-17 ENCOUNTER — Ambulatory Visit: Payer: PRIVATE HEALTH INSURANCE | Attending: Internal Medicine

## 2015-08-19 ENCOUNTER — Inpatient Hospital Stay: Payer: No Typology Code available for payment source | Attending: Internal Medicine

## 2015-08-19 ENCOUNTER — Inpatient Hospital Stay: Payer: No Typology Code available for payment source

## 2015-08-19 VITALS — BP 115/78 | HR 74 | Temp 96.0°F | Resp 20

## 2015-08-19 DIAGNOSIS — E538 Deficiency of other specified B group vitamins: Secondary | ICD-10-CM | POA: Insufficient documentation

## 2015-08-19 DIAGNOSIS — D509 Iron deficiency anemia, unspecified: Secondary | ICD-10-CM | POA: Insufficient documentation

## 2015-08-19 DIAGNOSIS — Z9884 Bariatric surgery status: Secondary | ICD-10-CM | POA: Diagnosis not present

## 2015-08-19 LAB — IRON AND TIBC
Iron: 41 ug/dL (ref 28–170)
Saturation Ratios: 12 % (ref 10.4–31.8)
TIBC: 346 ug/dL (ref 250–450)
UIBC: 305 ug/dL

## 2015-08-19 LAB — FERRITIN: Ferritin: 30 ng/mL (ref 11–307)

## 2015-08-19 LAB — HEMOGLOBIN: Hemoglobin: 12.6 g/dL (ref 12.0–16.0)

## 2015-08-19 MED ORDER — SODIUM CHLORIDE 0.9 % IV SOLN
200.0000 mg | Freq: Once | INTRAVENOUS | Status: AC
  Administered 2015-08-19: 200 mg via INTRAVENOUS
  Filled 2015-08-19: qty 10

## 2015-08-19 MED ORDER — SODIUM CHLORIDE 0.9 % IV SOLN
Freq: Once | INTRAVENOUS | Status: AC
  Administered 2015-08-19: 10 mL/h via INTRAVENOUS
  Filled 2015-08-19: qty 1000

## 2015-08-27 ENCOUNTER — Other Ambulatory Visit: Admit: 2015-08-27 | Discharge: 2015-08-27 | Payer: BLUE CROSS/BLUE SHIELD | Primary: Family Medicine

## 2015-08-27 DIAGNOSIS — Z0000 Encounter for general adult medical examination without abnormal findings: Secondary | ICD-10-CM

## 2015-08-27 LAB — AMB POC URINALYSIS DIP STICK AUTO W/O MICRO
Bilirubin (UA POC): NEGATIVE
Glucose (UA POC): NEGATIVE
Ketones (UA POC): NEGATIVE
Nitrites (UA POC): NEGATIVE
Protein (UA POC): NEGATIVE mg/dL
Specific gravity (UA POC): 1.02 (ref 1.001–1.035)
Urobilinogen (UA POC): 0.2 (ref 0.2–1)
pH (UA POC): 7 (ref 4.6–8.0)

## 2015-08-28 LAB — METABOLIC PANEL, COMPREHENSIVE
A-G Ratio: 1.8 (ref 1.1–2.5)
ALT (SGPT): 21 IU/L (ref 0–32)
AST (SGOT): 32 IU/L (ref 0–40)
Albumin: 4.4 g/dL (ref 3.5–5.5)
Alk. phosphatase: 68 IU/L (ref 39–117)
BUN/Creatinine ratio: 17 (ref 9–23)
BUN: 15 mg/dL (ref 6–24)
Bilirubin, total: <0.2 mg/dL (ref 0.0–1.2)
CO2: 27 mmol/L (ref 18–29)
Calcium: 9.2 mg/dL (ref 8.7–10.2)
Chloride: 99 mmol/L (ref 97–108)
Creatinine: 0.88 mg/dL (ref 0.57–1.00)
GFR est AA: 83 mL/min/{1.73_m2} (ref >59)
GFR est non-AA: 72 mL/min/{1.73_m2} (ref >59)
GLOBULIN, TOTAL: 2.5 g/dL (ref 1.5–4.5)
Glucose: 89 mg/dL (ref 65–99)
Potassium: 3.8 mmol/L (ref 3.5–5.2)
Protein, total: 6.9 g/dL (ref 6.0–8.5)
Sodium: 141 mmol/L (ref 134–144)

## 2015-08-28 LAB — LIPID PANEL WITH LDL/HDL RATIO
Cholesterol, total: 189 mg/dL (ref 100–199)
HDL Cholesterol: 82 mg/dL (ref >39)
LDL, calculated: 96 mg/dL (ref 0–99)
LDL/HDL Ratio: 1.2 ratio units (ref 0.0–3.2)
Triglyceride: 53 mg/dL (ref 0–149)
VLDL, calculated: 11 mg/dL (ref 5–40)

## 2015-08-28 LAB — CBC WITH AUTOMATED DIFF
ABS. BASOPHILS: 0 10*3/uL (ref 0.0–0.2)
ABS. EOSINOPHILS: 0.2 10*3/uL (ref 0.0–0.4)
ABS. IMM. GRANS.: 0 10*3/uL (ref 0.0–0.1)
ABS. MONOCYTES: 0.4 10*3/uL (ref 0.1–0.9)
ABS. NEUTROPHILS: 1.4 10*3/uL (ref 1.4–7.0)
Abs Lymphocytes: 1.9 10*3/uL (ref 0.7–3.1)
BASOPHILS: 1 %
EOSINOPHILS: 6 %
HCT: 35 % (ref 34.0–46.6)
HGB: 10.4 g/dL — ABNORMAL LOW (ref 11.1–15.9)
IMMATURE GRANULOCYTES: 0 %
Lymphocytes: 47 %
MCH: 21.1 pg — ABNORMAL LOW (ref 26.6–33.0)
MCHC: 29.7 g/dL — ABNORMAL LOW (ref 31.5–35.7)
MCV: 71 fL — ABNORMAL LOW (ref 79–97)
MONOCYTES: 11 %
NEUTROPHILS: 35 %
PLATELET: 386 10*3/uL — ABNORMAL HIGH (ref 150–379)
RBC: 4.92 x10E6/uL (ref 3.77–5.28)
RDW: 20.6 % — ABNORMAL HIGH (ref 12.3–15.4)
WBC: 3.9 10*3/uL (ref 3.4–10.8)

## 2015-08-28 LAB — TSH 3RD GENERATION: TSH: 3.25 u[IU]/mL (ref 0.450–4.500)

## 2015-08-29 NOTE — Progress Notes (Signed)
Lab advised to add Fe level.

## 2015-08-31 LAB — SPECIMEN STATUS REPORT

## 2015-08-31 LAB — IRON: Iron: 17 ug/dL — ABNORMAL LOW (ref 27–159)

## 2015-08-31 NOTE — Progress Notes (Signed)
Add Fe supplement; check hemoccult cards; recheck hgb at next visit

## 2015-09-03 ENCOUNTER — Encounter: Payer: BLUE CROSS/BLUE SHIELD | Attending: Family Medicine | Primary: Family Medicine

## 2015-09-03 NOTE — Progress Notes (Signed)
LMOVM with permission per HIPAA form in chart advising pt of results and recommendations.  Hemoccult cards placed at front desk.

## 2015-09-11 ENCOUNTER — Other Ambulatory Visit: Admit: 2015-09-11 | Discharge: 2015-09-11 | Payer: BLUE CROSS/BLUE SHIELD | Primary: Family Medicine

## 2015-09-11 DIAGNOSIS — Z0000 Encounter for general adult medical examination without abnormal findings: Secondary | ICD-10-CM

## 2015-09-11 LAB — AMB POC FECAL BLOOD, OCCULT, QL 3 CARDS
Hemoccult (POC): NEGATIVE
Occult Blood-2 (POC): NEGATIVE
Occult blood-3 (POC): NEGATIVE

## 2015-09-11 NOTE — Progress Notes (Signed)
Lab letter mailed 09/11/15  pl

## 2015-09-12 ENCOUNTER — Encounter

## 2015-09-16 MED ORDER — LOSARTAN-HYDROCHLOROTHIAZIDE 100 MG-25 MG TAB
100-25 mg | ORAL_TABLET | ORAL | 0 refills | Status: DC
Start: 2015-09-16 — End: 2015-10-15

## 2015-09-16 NOTE — Telephone Encounter (Signed)
Hyzaar 100/25 mg tablets to be called to [facility] on [address].  Patient is out of medication and has appointment in October.  Asks to not be charged for this since she does have an upcoming appointment.

## 2015-09-30 ENCOUNTER — Encounter

## 2015-09-30 MED ORDER — AMLODIPINE 10 MG TAB
10 mg | ORAL_TABLET | Freq: Every day | ORAL | 0 refills | Status: DC
Start: 2015-09-30 — End: 2015-10-15

## 2015-09-30 NOTE — Telephone Encounter (Signed)
Amlodipine 10mg . OV was changed due to doctor being out of office. Please don't charge. To [facility] [phone]. Erx sent for 30 days

## 2015-10-09 ENCOUNTER — Ambulatory Visit: Payer: No Typology Code available for payment source

## 2015-10-15 ENCOUNTER — Ambulatory Visit
Admit: 2015-10-15 | Discharge: 2015-10-15 | Payer: BLUE CROSS/BLUE SHIELD | Attending: Family Medicine | Primary: Family Medicine

## 2015-10-15 ENCOUNTER — Ambulatory Visit: Payer: Self-pay | Admitting: Physician Assistant

## 2015-10-15 DIAGNOSIS — Z0000 Encounter for general adult medical examination without abnormal findings: Secondary | ICD-10-CM

## 2015-10-15 DIAGNOSIS — E538 Deficiency of other specified B group vitamins: Secondary | ICD-10-CM

## 2015-10-15 MED ORDER — AMLODIPINE 10 MG TAB
10 mg | ORAL_TABLET | Freq: Every day | ORAL | 3 refills | Status: DC
Start: 2015-10-15 — End: 2016-04-22

## 2015-10-15 MED ORDER — LOSARTAN-HYDROCHLOROTHIAZIDE 100 MG-25 MG TAB
100-25 mg | ORAL_TABLET | Freq: Every day | ORAL | 3 refills | Status: DC
Start: 2015-10-15 — End: 2016-10-14

## 2015-10-15 MED ORDER — SIMVASTATIN 40 MG TAB
40 mg | ORAL_TABLET | Freq: Every evening | ORAL | 3 refills | Status: DC
Start: 2015-10-15 — End: 2016-04-22

## 2015-10-15 MED ORDER — CYANOCOBALAMIN 1000 MCG/ML IJ SOLN: 1000.0000 ug | Freq: Once | INTRAMUSCULAR | Status: DC

## 2015-10-15 MED ORDER — CYANOCOBALAMIN 1000 MCG/ML IJ SOLN
1000.0000 ug | Freq: Once | INTRAMUSCULAR | Status: AC
  Administered 2015-10-15: 1000 ug via INTRAMUSCULAR

## 2015-10-15 NOTE — Progress Notes (Signed)
Here for b12

## 2015-10-15 NOTE — Progress Notes (Signed)
Katelyn [name] is a 59 y.o. female who presents with   Chief Complaint   Patient presents with   ??? Annual Wellness Visit     does not take flu vaccines, TDaP 01-19-12, mammogram 09-28-14, Pap approx 180 mos ago through GYN per pt hx (has never had an abnormal Pap), colonoscopy 12-09-06, DEXA 4-5 years ago (osteopenia), last eye exam Dec 2015       History of Present Illness  Here for CPX.  Hgb was 10.4 and Fe 17- started supplement.  No blood in stool.  Postmenopausal. BP has been doing well at home.  No CP or SOB.  No headaches or edema.  No side effects with medications.  Exercising regularly.  No gi sxs.  No urinary sxs.  Mood good.    Review of Systems  Review of Systems   Constitutional: Negative for chills, fever and malaise/fatigue.   HENT: Negative for congestion and hearing loss.    Eyes: Negative for blurred vision, pain and discharge.   Respiratory: Negative for cough, hemoptysis, shortness of breath and wheezing.    Cardiovascular: Negative for chest pain, palpitations and leg swelling.   Gastrointestinal: Negative for abdominal pain, blood in stool, constipation, diarrhea, heartburn and nausea.   Genitourinary: Negative for dysuria, frequency, hematuria and urgency.   Musculoskeletal: Negative for joint pain and myalgias.   Skin: Negative for rash.   Neurological: Negative for dizziness, sensory change and headaches.   Endo/Heme/Allergies: Negative for polydipsia. Does not bruise/bleed easily.   Psychiatric/Behavioral: Negative for depression. The patient is not nervous/anxious and does not have insomnia.        Medications  Current Outpatient Prescriptions   Medication Sig   ??? ferrous sulfate (IRON) 325 mg (65 mg iron) tablet Take  by mouth Daily (before breakfast).   ??? amLODIPine (NORVASC) 10 mg tablet Take 1 Tab by mouth daily.   ??? losartan-hydrochlorothiazide (HYZAAR) 100-25 mg per tablet Take 1 Tab by mouth daily.   ??? simvastatin (ZOCOR) 40 mg tablet Take 0.5 Tabs by mouth nightly.    ??? fluticasone (FLONASE) 50 mcg/actuation nasal spray 2 Sprays by Both Nostrils route once.   ??? aspirin delayed-release 81 mg tablet Take  by mouth daily.   ??? calcium-cholecalciferol, d3, (CALCIUM 600 + D) 600-125 mg-unit tab Take  by mouth two (2) times a day.   ??? OTHER Thrive mvi supplements   ??? cetirizine (ZYRTEC) 10 mg tablet Take  by mouth.     No current facility-administered medications for this visit.        Past Medical History  Past Medical History   Diagnosis Date   ??? Cancer ([place])      melanoma - back   ??? Hypercholesterolemia    ??? Hypertension    ??? Osteopenia        Surgical History  Past Surgical History   Procedure Laterality Date   ??? Hx gyn       c-section x 5   ??? Hx colonoscopy            Family History  Family History   Problem Relation Age of Onset   ??? Hypertension Mother    ??? Elevated Lipids Mother    ??? Elevated Lipids Father    ??? Hypertension Father    ??? Arthritis-osteo Father      spine   ??? Heart Disease Father    ??? Breast Cancer Other        Social History  Social History  Social History   ??? Marital status: MARRIED     Spouse name: N/A   ??? Number of children: N/A   ??? Years of education: N/A     Occupational History   ??? Not on file.     Social History Main Topics   ??? Smoking status: Never Smoker   ??? Smokeless tobacco: Never Used   ??? Alcohol use No   ??? Drug use: Not on file   ??? Sexual activity: Not on file     Other Topics Concern   ??? Not on file     Social History Narrative       History   Smoking Status   ??? Never Smoker   Smokeless Tobacco   ??? Never Used       Allergies  No Known Allergies    Vital Signs  Body mass index is 23.19 kg/(m^2).  Vitals:    10/15/15 1037   BP: 130/74   Pulse: 71   SpO2: 97%   Weight: 142 lb 9.6 oz (64.7 kg)   Height: 5' 5.75" (1.67 m)       Physical Exam  Physical Exam   Constitutional: She is oriented to person, place, and time and well-developed, well-nourished, and in no distress.   HENT:   Head: Normocephalic.   Right Ear: Tympanic membrane normal.    Left Ear: Tympanic membrane normal.   Nose: Right sinus exhibits no maxillary sinus tenderness. Left sinus exhibits no maxillary sinus tenderness.   Mouth/Throat: Mucous membranes are normal. No oropharyngeal exudate.   Neck: No JVD present. No thyromegaly present.   Cardiovascular: Normal rate, regular rhythm, normal heart sounds and intact distal pulses.  Exam reveals no gallop and no friction rub.    No murmur heard.  Pulmonary/Chest: Breath sounds normal. No respiratory distress. She has no wheezes. She has no rales. Right breast exhibits no inverted nipple, no mass, no nipple discharge, no skin change and no tenderness. Left breast exhibits no inverted nipple, no mass, no nipple discharge, no skin change and no tenderness. Breasts are symmetrical.   Abdominal: Soft. Bowel sounds are normal. She exhibits no distension and no mass. There is no tenderness. There is no rebound and no guarding.   Genitourinary: Vagina normal, uterus normal, cervix normal, right adnexa normal and left adnexa normal. Rectal exam shows guaiac negative stool. No vaginal discharge found.   Musculoskeletal: Normal range of motion. She exhibits no edema or tenderness.   Lymphadenopathy:     She has no cervical adenopathy.   Neurological: She is alert and oriented to person, place, and time. She has normal reflexes.   Skin: Skin is warm and dry. No rash noted.   Psychiatric: Affect normal.       Assessment and Plan  Seylah was seen today for annual wellness visit.    Diagnoses and all orders for this visit:    Preventative health care  -     PAP (IMAGE GUIDED) + HPV HIGH RISK    Essential hypertension  -     amLODIPine (NORVASC) 10 mg tablet; Take 1 Tab by mouth daily.  -     losartan-hydrochlorothiazide (HYZAAR) 100-25 mg per tablet; Take 1 Tab by mouth daily.    Hyperlipidemia, unspecified hyperlipidemia type  -     simvastatin (ZOCOR) 40 mg tablet; Take 0.5 Tabs by mouth nightly.    Encounter for screening mammogram for breast cancer   -     MAM MAMMO BI SCREENING DIGTL;  Future    Iron deficiency anemia, unspecified iron deficiency anemia type    Routine cervical smear    cont current meds  Diet/exercise  Recheck 6 mo

## 2015-10-17 LAB — PAP (IMAGE GUIDED) + HPV HIGH RISK
.: 0
HPV DNA Probe, High Risk: NEGATIVE
HPV, High Risk: NEGATIVE
LABCORP 019018: 0

## 2015-10-21 NOTE — Progress Notes (Signed)
Letter Mailed 10/21/15  pl

## 2015-11-07 ENCOUNTER — Encounter: Payer: Self-pay | Admitting: Family

## 2015-11-07 ENCOUNTER — Ambulatory Visit: Payer: Self-pay | Admitting: Family

## 2015-11-07 VITALS — BP 120/80 | HR 95 | Temp 102.5°F

## 2015-11-07 DIAGNOSIS — R509 Fever, unspecified: Secondary | ICD-10-CM

## 2015-11-07 LAB — POCT RAPID STREP A (OFFICE): Rapid Strep A Screen: NEGATIVE

## 2015-11-07 NOTE — Progress Notes (Signed)
S/ acute onset fever,mild generalised cold sxs, aching all over and chilled , some diarhea no vomiting, denies gu sxs had flu shot  O/ fever noted otherwise looks well NAD nontoxic ENT - remarkable for injected pharynx , rapid strep negative, neg sinus tenderness, neck supple without nodes heart rsr lungs clear  A/ viral illness , fever P/ home  For rest of day and until fever free x 24 hours. Supportive care. rx for zpack given if she has localised sinus sxs ,follow up prn not improving .

## 2015-11-11 ENCOUNTER — Ambulatory Visit: Payer: No Typology Code available for payment source

## 2015-11-11 ENCOUNTER — Other Ambulatory Visit: Payer: No Typology Code available for payment source

## 2015-11-25 ENCOUNTER — Ambulatory Visit: Payer: Self-pay | Admitting: Physician Assistant

## 2015-11-25 DIAGNOSIS — E538 Deficiency of other specified B group vitamins: Secondary | ICD-10-CM

## 2015-11-25 MED ORDER — CYANOCOBALAMIN 1000 MCG/ML IJ SOLN
1000.0000 ug | Freq: Once | INTRAMUSCULAR | Status: AC
  Administered 2015-11-25: 1000 ug via INTRAMUSCULAR

## 2015-11-25 NOTE — Progress Notes (Signed)
Patient ID: Helen BalboaCynthia L Stanley, female   DOB: 03/19/1956, 59 y.o.   MRN: [id number] Patient came in to get her monthly vitamin b12 injection.  Injection was given in the left deltoid without any incident.  Pt will call back to schedule her next injection in January.

## 2015-11-26 ENCOUNTER — Other Ambulatory Visit: Admit: 2015-11-26 | Discharge: 2015-11-26 | Payer: BLUE CROSS/BLUE SHIELD | Primary: Family Medicine

## 2015-11-26 DIAGNOSIS — R6889 Other general symptoms and signs: Secondary | ICD-10-CM

## 2015-11-26 LAB — AMB POC HEMOGLOBIN (HGB): Hemoglobin (POC): 14.4

## 2015-12-11 ENCOUNTER — Inpatient Hospital Stay: Admit: 2015-12-11 | Payer: BLUE CROSS/BLUE SHIELD | Attending: Family Medicine | Primary: Family Medicine

## 2015-12-11 DIAGNOSIS — Z1231 Encounter for screening mammogram for malignant neoplasm of breast: Secondary | ICD-10-CM

## 2015-12-26 IMAGING — CT CT ABD-PELV W/ CM
2 of 5 series · 17 of 46 positions shown, 19 images · IV contrast (omnipaque)
Comparison: 08/25/2011

CLINICAL DATA: Abdominal pain

EXAM:
CT ABDOMEN AND PELVIS WITH CONTRAST
TECHNIQUE: Multidetector CT imaging of the abdomen and pelvis was performed
using the standard protocol following bolus administration of
intravenous contrast.
CONTRAST:  100 cc Omnipaque 300 IV contrast

[Series 2: routine abd pel with · axial · 0.92mm/px · z∈[+429,+854]mm · 14 of 96 slices shown, 16 images]
[im 6/96  soft-tissue]
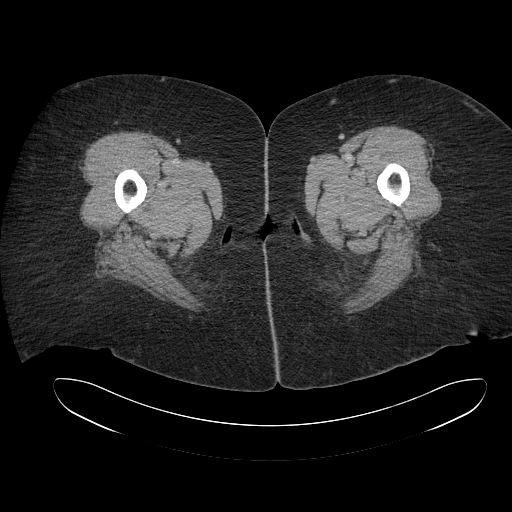
[im 6/96  bone]
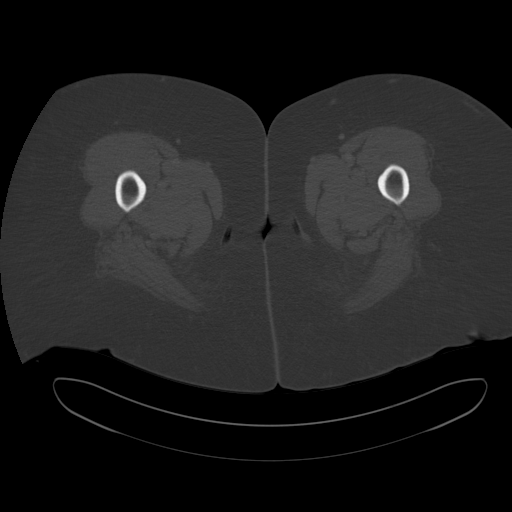
[im 11/96  soft-tissue]
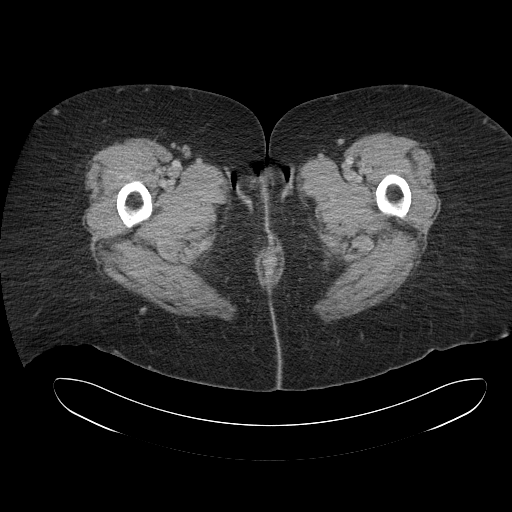
[im 21/96  soft-tissue]
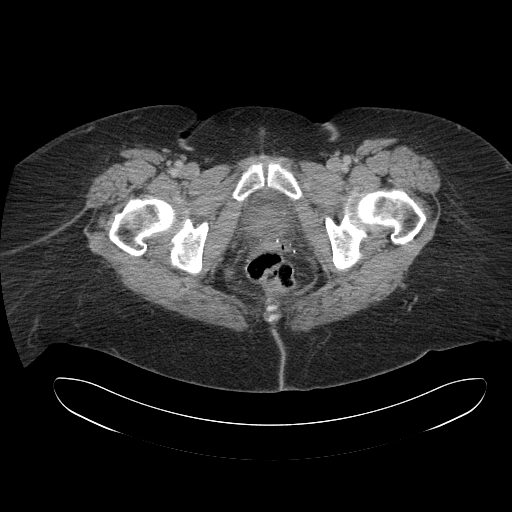
[im 26/96  soft-tissue]
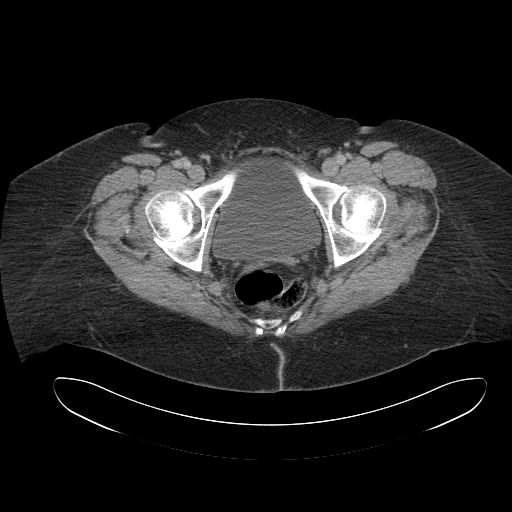
[im 31/96  soft-tissue]
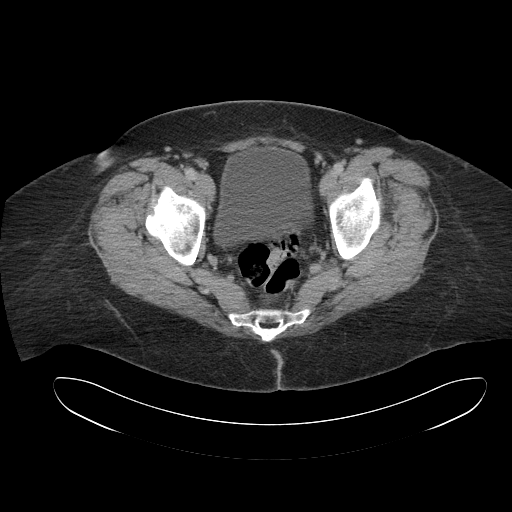
[im 41/96  soft-tissue]
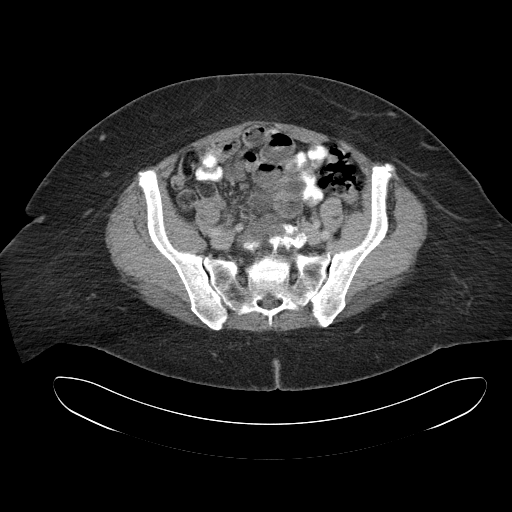
[im 46/96  soft-tissue]
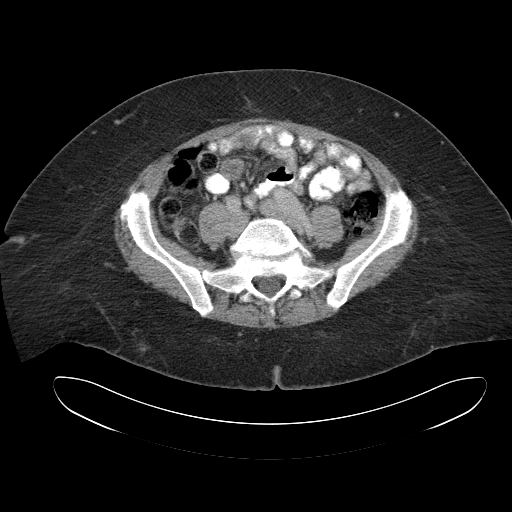
[im 51/96  soft-tissue]
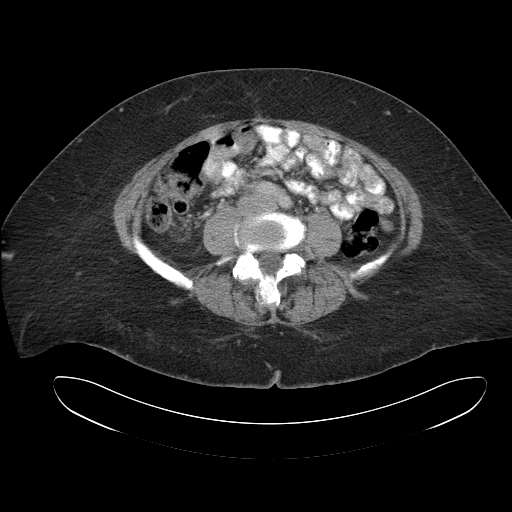
[im 56/96  soft-tissue]
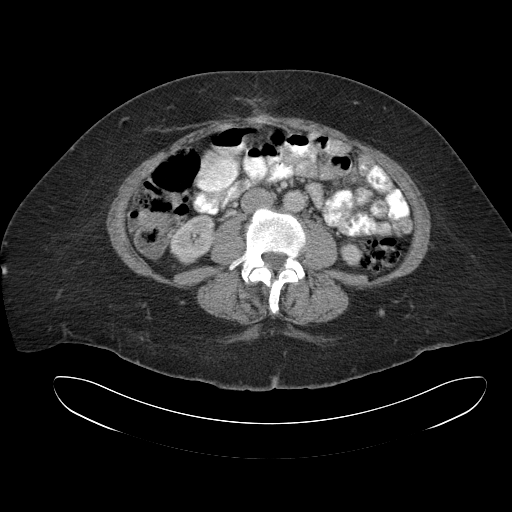
[im 56/96  bone]
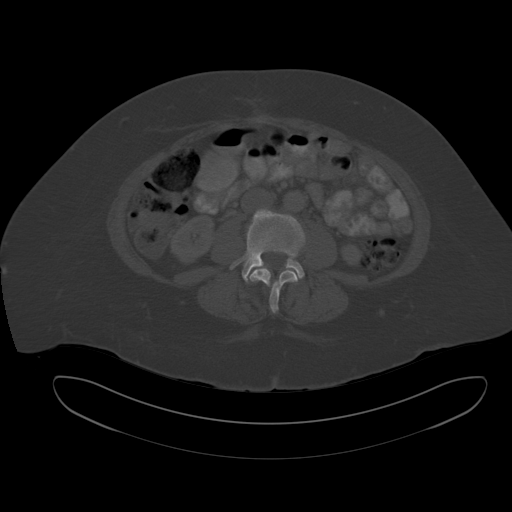
[im 66/96  soft-tissue]
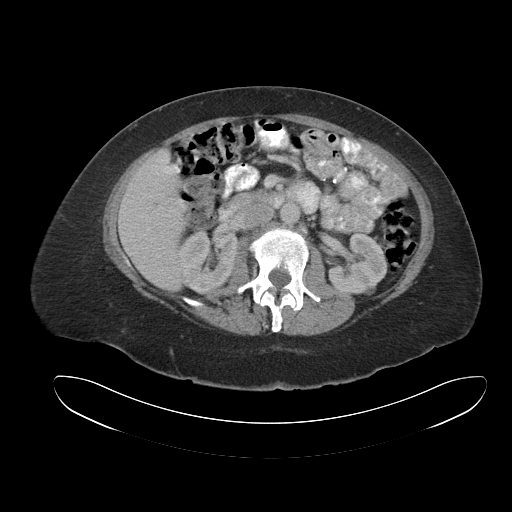
[im 71/96  soft-tissue]
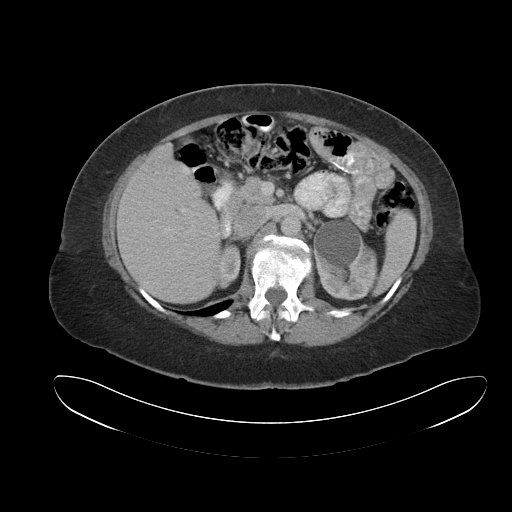
[im 76/96  soft-tissue]
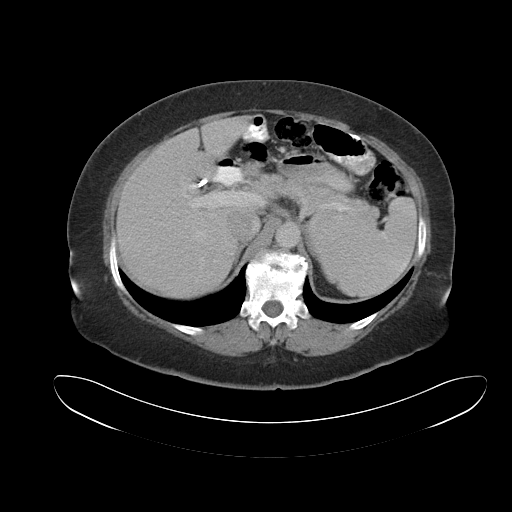
[im 86/96  soft-tissue]
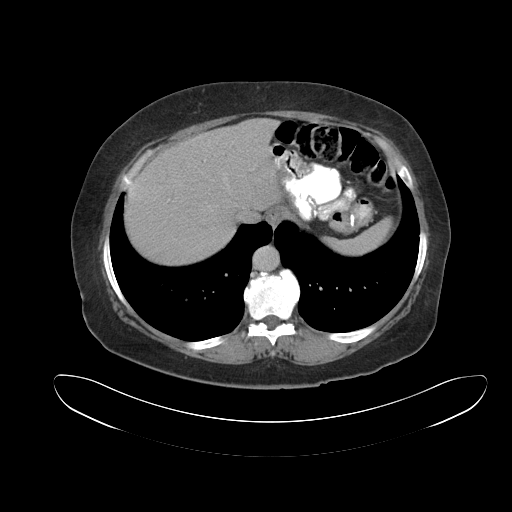
[im 91/96  soft-tissue]
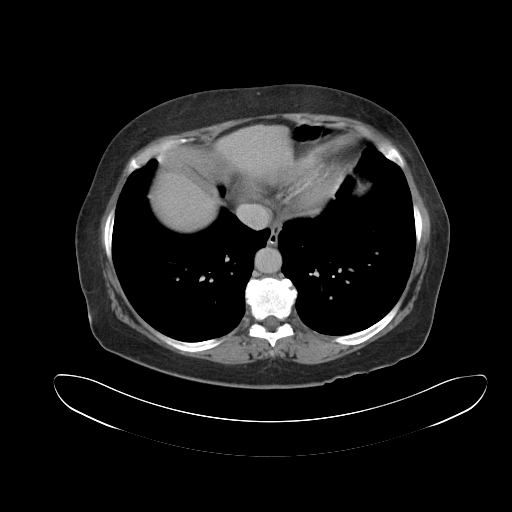

[Series 6: cor routine abd pel with · coronal · 0.85mm/px · 3 of 140 slices shown]
[im 47/140  soft-tissue]
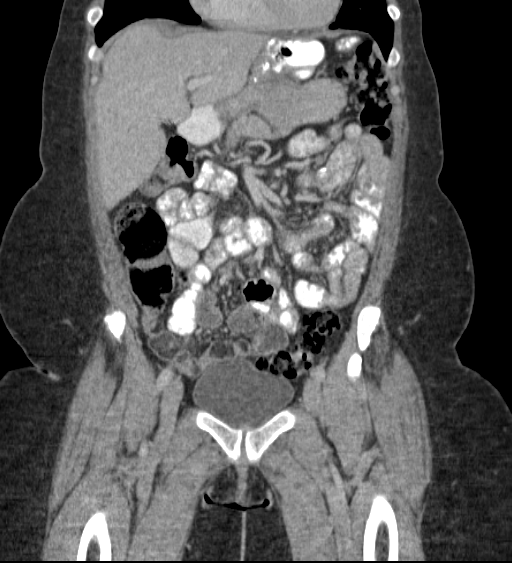
[im 62/140  soft-tissue]
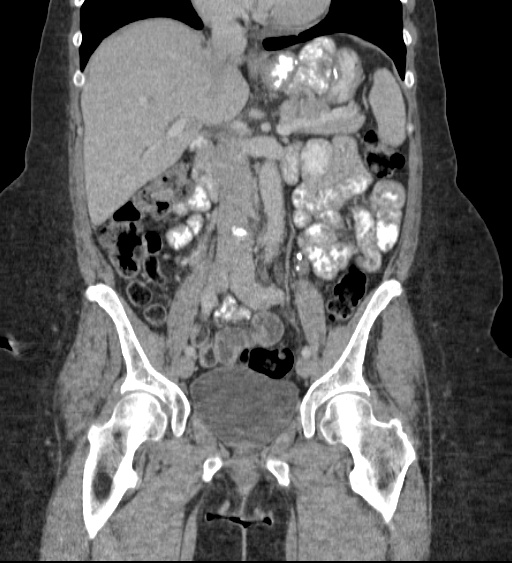
[im 78/140  soft-tissue]
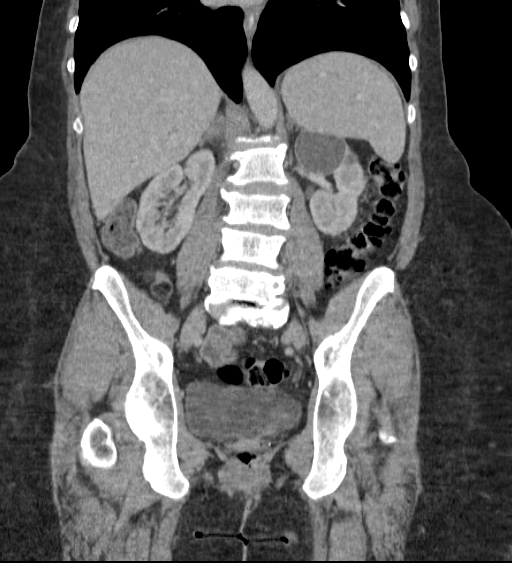

[17 of 46 positions shown; findings below may reference images not displayed]

FINDINGS: Lung bases are clear.

Evidence of gastric bypass surgery. Cholecystectomy clips are noted.
Mild central intrahepatic ductal prominence reidentified. No focal
hepatic abnormality. Adrenal glands, right kidney, spleen, and
pancreas are normal. Left renal cortical cysts are identified,
largest in the left upper renal pole measuring 4.5 cm image 26. No
free air, free fluid, or lymphadenopathy.

Midline pelvic abdominal wall diastasis noted containing a knuckle
of nonobstructed bowel. No bowel wall thickening or focal segmental
dilatation is identified. Appendix not identified but there is no
secondary evidence for acute appendicitis. Bladder is normal. Uterus
presumed surgically absent, not visualized. Neither ovary is
visualized but no adnexal mass is seen. No radiopaque ureteral or
bladder calculus.

Bilateral L5 pars interarticularis defects. No acute osseous
abnormality. Multilevel disc degenerative changes noted most
prominent at L5-S1 with 1 cm anterolisthesis of L5 on S1.
IMPRESSION: No acute intra-abdominal or pelvic pathology.

## 2016-02-03 ENCOUNTER — Inpatient Hospital Stay: Payer: 59

## 2016-02-03 ENCOUNTER — Other Ambulatory Visit: Payer: Self-pay | Admitting: *Deleted

## 2016-02-03 ENCOUNTER — Other Ambulatory Visit: Payer: Self-pay | Admitting: Internal Medicine

## 2016-02-03 ENCOUNTER — Inpatient Hospital Stay: Payer: 59 | Attending: Internal Medicine

## 2016-02-03 DIAGNOSIS — Z9884 Bariatric surgery status: Secondary | ICD-10-CM | POA: Insufficient documentation

## 2016-02-03 DIAGNOSIS — D509 Iron deficiency anemia, unspecified: Secondary | ICD-10-CM | POA: Diagnosis not present

## 2016-02-03 DIAGNOSIS — E538 Deficiency of other specified B group vitamins: Secondary | ICD-10-CM | POA: Insufficient documentation

## 2016-02-03 LAB — COMPREHENSIVE METABOLIC PANEL
ALBUMIN: 4 g/dL (ref 3.5–5.0)
ALT: 15 U/L (ref 14–54)
ANION GAP: 5 (ref 5–15)
AST: 23 U/L (ref 15–41)
Alkaline Phosphatase: 79 U/L (ref 38–126)
BILIRUBIN TOTAL: 0.5 mg/dL (ref 0.3–1.2)
BUN: 23 mg/dL — ABNORMAL HIGH (ref 6–20)
CHLORIDE: 109 mmol/L (ref 101–111)
CO2: 23 mmol/L (ref 22–32)
Calcium: 8.6 mg/dL — ABNORMAL LOW (ref 8.9–10.3)
Creatinine, Ser: 0.87 mg/dL (ref 0.44–1.00)
GFR calc Af Amer: >60 mL/min (ref >60)
GFR calc non Af Amer: >60 mL/min (ref >60)
GLUCOSE: 107 mg/dL — AB (ref 65–99)
POTASSIUM: 3.7 mmol/L (ref 3.5–5.1)
Sodium: 137 mmol/L (ref 135–145)
TOTAL PROTEIN: 6.8 g/dL (ref 6.5–8.1)

## 2016-02-03 LAB — CBC WITH DIFFERENTIAL/PLATELET
BASOS ABS: 0.1 10*3/uL (ref 0–0.1)
BASOS PCT: 1 %
EOS ABS: 0.1 10*3/uL (ref 0–0.7)
EOS PCT: 3 %
HEMATOCRIT: 37.7 % (ref 35.0–47.0)
Hemoglobin: 12.7 g/dL (ref 12.0–16.0)
Lymphocytes Relative: 37 %
Lymphs Abs: 1.4 10*3/uL (ref 1.0–3.6)
MCH: 30.2 pg (ref 26.0–34.0)
MCHC: 33.7 g/dL (ref 32.0–36.0)
MCV: 89.7 fL (ref 80.0–100.0)
MONO ABS: 0.5 10*3/uL (ref 0.2–0.9)
MONOS PCT: 12 %
NEUTROS ABS: 1.8 10*3/uL (ref 1.4–6.5)
Neutrophils Relative %: 47 %
PLATELETS: 179 10*3/uL (ref 150–440)
RBC: 4.21 MIL/uL (ref 3.80–5.20)
RDW: 14.1 % (ref 11.5–14.5)
WBC: 3.8 10*3/uL (ref 3.6–11.0)

## 2016-02-03 LAB — IRON AND TIBC
Iron: 54 ug/dL (ref 28–170)
SATURATION RATIOS: 16 % (ref 10.4–31.8)
TIBC: 332 ug/dL (ref 250–450)
UIBC: 278 ug/dL

## 2016-02-03 LAB — FERRITIN: FERRITIN: 26 ng/mL (ref 11–307)

## 2016-02-03 NOTE — Progress Notes (Signed)
Ferritin result not back. Pt needed to leave and could not stay for results. Asked to call her cell phone this evening [phone] or call her at work tomorrow at [phone] with results and let her know if she needs venofer infusion.

## 2016-02-04 ENCOUNTER — Telehealth: Payer: Self-pay | Admitting: *Deleted

## 2016-02-04 NOTE — Telephone Encounter (Signed)
Spoke with patients. She is asymptomatic with her IDA. MD states that labs are currently stable. No IV iron infusion needed at this time. Pt gave verbal understanding. I explained that she needs to come a few days before her next md apt in May to have labs drawn so that the lab tests are back before MD apt. She gave verbal understanding of this plan. She asked that she be scheduled for 04/30/16 at 4pm for the lab draw and that she see the md on 05/06/16 at 1100 for lab/iron infusion. I explained that I would get scheduling to contact her with these appointments.

## 2016-02-11 ENCOUNTER — Encounter: Payer: Self-pay | Admitting: Physician Assistant

## 2016-02-11 ENCOUNTER — Ambulatory Visit: Payer: Self-pay | Admitting: Physician Assistant

## 2016-02-11 VITALS — BP 120/89 | HR 88 | Temp 99.0°F

## 2016-02-11 DIAGNOSIS — R509 Fever, unspecified: Secondary | ICD-10-CM

## 2016-02-11 DIAGNOSIS — J069 Acute upper respiratory infection, unspecified: Secondary | ICD-10-CM

## 2016-02-11 DIAGNOSIS — E538 Deficiency of other specified B group vitamins: Secondary | ICD-10-CM

## 2016-02-11 LAB — POCT INFLUENZA A/B
Influenza A, POC: NEGATIVE
Influenza B, POC: NEGATIVE

## 2016-02-11 MED ORDER — AZITHROMYCIN 250 MG PO TABS: ORAL_TABLET | ORAL | Status: DC

## 2016-02-11 MED ORDER — CYANOCOBALAMIN 1000 MCG/ML IJ SOLN
1000.0000 ug | Freq: Once | INTRAMUSCULAR | Status: AC
  Administered 2016-02-11: 1000 ug via INTRAMUSCULAR

## 2016-02-11 NOTE — Progress Notes (Signed)
S: C/o runny nose and congestion for 3 days, ? fever, chills, denies cp/sob, v/d; mucus is green and thick, cough is sporadic, c/o of facial and dental pain. Feeling really run down  O: PE: vitals wnl nad, perrl eomi, normocephalic, tms dull, nasal mucosa red and swollen, throat injected, neck supple no lymph, lungs c t a, cv rrr, neuro intact, flu swab neg  A:  Acute sinusitis   P: zpack, drink fluids, continue regular meds , use otc meds of choice, return if not improving in 5 days, return earlier if worsening

## 2016-02-11 NOTE — Addendum Note (Signed)
Addended by: Catha Brow T on: 02/11/2016 04:50 PM   Modules accepted: Orders

## 2016-03-12 ENCOUNTER — Encounter: Payer: Self-pay | Admitting: Physician Assistant

## 2016-03-12 ENCOUNTER — Ambulatory Visit: Payer: Self-pay | Admitting: Physician Assistant

## 2016-03-12 VITALS — BP 170/100 | HR 116 | Temp 102.6°F

## 2016-03-12 DIAGNOSIS — R509 Fever, unspecified: Secondary | ICD-10-CM

## 2016-03-12 DIAGNOSIS — J09X2 Influenza due to identified novel influenza A virus with other respiratory manifestations: Secondary | ICD-10-CM

## 2016-03-12 DIAGNOSIS — J209 Acute bronchitis, unspecified: Secondary | ICD-10-CM

## 2016-03-12 DIAGNOSIS — E538 Deficiency of other specified B group vitamins: Secondary | ICD-10-CM

## 2016-03-12 LAB — POCT INFLUENZA A/B
Influenza A, POC: POSITIVE — AB
Influenza B, POC: NEGATIVE

## 2016-03-12 MED ORDER — CYANOCOBALAMIN 1000 MCG/ML IJ SOLN
1000.0000 ug | Freq: Once | INTRAMUSCULAR | Status: AC
  Administered 2016-03-12: 1000 ug via INTRAMUSCULAR

## 2016-03-12 MED ORDER — OSELTAMIVIR PHOSPHATE 75 MG PO CAPS: 75.0000 mg | ORAL_CAPSULE | Freq: Two times a day (BID) | ORAL | Status: AC

## 2016-03-12 MED ORDER — AZITHROMYCIN 250 MG PO TABS: ORAL_TABLET | ORAL | Status: AC

## 2016-03-12 NOTE — Progress Notes (Signed)
S: C/o runny nose, sore throat and congestion with dry cough for 1-2 days, + fever, chills, grandson has the flu and she was feeding him from her spoon the other night; denies cp/sob, v/d; mucus is brown and yellow; pt's husband is in rehab center in [place] and having to have blood transfusions, he is also running a temp Using otc meds: robitussin  O: PE: vitals w elevated temp, hr, and bp;  nad,  perrl eomi, normocephalic, tms dull, nasal mucosa red and swollen, throat injected, neck supple no lymph, lungs c t a, cv rrr, neuro intact, flu swab +A  A:  Acute influenza A, bronchitis   P: tamiflu 75mg  bid, empirical coverage with zpack; drink fluids, continue regular meds , use otc meds of choice, return if not improving in 5 days, return earlier if worsening , pt to notify her husband's doctors of + flu test here in clinic

## 2016-03-12 NOTE — Addendum Note (Signed)
Addended by: Catha BrowEACON, Ancel Easler T on: 03/12/2016 01:58 PM   Modules accepted: Orders

## 2016-04-02 ENCOUNTER — Ambulatory Visit
Admit: 2016-04-02 | Discharge: 2016-04-02 | Payer: BLUE CROSS/BLUE SHIELD | Attending: Family Medicine | Primary: Family Medicine

## 2016-04-02 DIAGNOSIS — M25462 Effusion, left knee: Secondary | ICD-10-CM

## 2016-04-02 MED ORDER — METHYLPREDNISOLONE 4 MG TABS IN A DOSE PACK
4 mg | ORAL | 0 refills | Status: DC
Start: 2016-04-02 — End: 2016-04-14

## 2016-04-02 NOTE — Progress Notes (Signed)
Katelyn [name] is a 60 y.o. female who presents with   Chief Complaint   Patient presents with   ??? Knee Pain     left--pain and swelling in Feb after line-dancing but resolved.  Re-occurred after dancing in March.  Long walk 3 days ago--now pain and swelling.       History of Present Illness  L knee pain x 2 mo since line dancing.  Improved then recurred.  Now swollen x 3 days.  No catching locking or giving way.  Swelling off and on x 2 mo.  Using topical DMSO.    Review of Systems  Review of Systems   Constitutional: Negative for chills, fever and malaise/fatigue.   HENT: Negative for congestion and hearing loss.    Eyes: Negative for blurred vision, pain and discharge.   Respiratory: Negative for cough, hemoptysis, shortness of breath and wheezing.    Cardiovascular: Negative for chest pain, palpitations and leg swelling.   Gastrointestinal: Negative for abdominal pain, blood in stool, constipation, diarrhea, heartburn and nausea.   Genitourinary: Negative for dysuria, frequency, hematuria and urgency.   Musculoskeletal: Positive for joint pain. Negative for myalgias.   Skin: Negative for rash.   Neurological: Negative for dizziness, sensory change and headaches.   Endo/Heme/Allergies: Negative for polydipsia. Does not bruise/bleed easily.   Psychiatric/Behavioral: Negative for depression. The patient is not nervous/anxious and does not have insomnia.        Medications  Current Outpatient Prescriptions   Medication Sig   ??? methylPREDNISolone (MEDROL, PAK,) 4 mg tablet Per dose pack instructions   ??? ferrous sulfate (IRON) 325 mg (65 mg iron) tablet Take  by mouth Daily (before breakfast).   ??? amLODIPine (NORVASC) 10 mg tablet Take 1 Tab by mouth daily.   ??? losartan-hydrochlorothiazide (HYZAAR) 100-25 mg per tablet Take 1 Tab by mouth daily.   ??? simvastatin (ZOCOR) 40 mg tablet Take 0.5 Tabs by mouth nightly.   ??? fluticasone (FLONASE) 50 mcg/actuation nasal spray 2 Sprays by Both Nostrils route once.    ??? aspirin delayed-release 81 mg tablet Take  by mouth daily.   ??? calcium-cholecalciferol, d3, (CALCIUM 600 + D) 600-125 mg-unit tab Take  by mouth two (2) times a day.   ??? cetirizine (ZYRTEC) 10 mg tablet Take  by mouth.     No current facility-administered medications for this visit.        Past Medical History  Past Medical History:   Diagnosis Date   ??? Cancer ([place])     melanoma - back   ??? Hypercholesterolemia    ??? Hypertension    ??? Osteopenia        Surgical History  Past Surgical History:   Procedure Laterality Date   ??? HX COLONOSCOPY     ??? HX GYN      c-section x 5          Family History  Family History   Problem Relation Age of Onset   ??? Hypertension Mother    ??? Elevated Lipids Mother    ??? Elevated Lipids Father    ??? Hypertension Father    ??? Arthritis-osteo Father      spine   ??? Heart Disease Father    ??? Breast Cancer Other        Social History  Social History     Social History   ??? Marital status: MARRIED     Spouse name: N/A   ??? Number of children: N/A   ???  Years of education: N/A     Occupational History   ??? Not on file.     Social History Main Topics   ??? Smoking status: Never Smoker   ??? Smokeless tobacco: Never Used   ??? Alcohol use No   ??? Drug use: Not on file   ??? Sexual activity: Not on file     Other Topics Concern   ??? Not on file     Social History Narrative       History   Smoking Status   ??? Never Smoker   Smokeless Tobacco   ??? Never Used       Allergies  No Known Allergies    Vital Signs  Body mass index is 23.42 kg/(m^2).  Vitals:    04/02/16 1439   BP: 128/84   Pulse: 81   SpO2: 98%   Weight: 144 lb (65.3 kg)       Physical Exam  Physical Exam   Constitutional: She is well-developed, well-nourished, and in no distress.   HENT:   Right Ear: Tympanic membrane normal.   Left Ear: Tympanic membrane normal.   Mouth/Throat: No oropharyngeal exudate.   Neck: No thyromegaly present.   Cardiovascular: Normal rate, regular rhythm, normal heart sounds and  normal pulses.  Exam reveals no gallop and no friction rub.    No murmur heard.  Pulmonary/Chest: Breath sounds normal. No respiratory distress. She has no wheezes. She has no rales.   Musculoskeletal: She exhibits tenderness (L knee medially with diffuse swelling, mild decreased rom; no instability). She exhibits no edema.   Lymphadenopathy:     She has no cervical adenopathy.   Skin: Skin is warm and dry.   Psychiatric: Affect normal.       Assessment and Plan  Katelyn [name] was seen today for knee pain.    Diagnoses and all orders for this visit:    Swelling of knee joint, left  -     ORTHOPAEDICS - [facility]    Other orders  -     methylPREDNISolone (MEDROL, PAK,) 4 mg tablet; Per dose pack instructions    ice prn

## 2016-04-14 ENCOUNTER — Ambulatory Visit
Admit: 2016-04-14 | Discharge: 2016-04-14 | Payer: BLUE CROSS/BLUE SHIELD | Attending: Family Medicine | Primary: Family Medicine

## 2016-04-14 DIAGNOSIS — I10 Essential (primary) hypertension: Secondary | ICD-10-CM

## 2016-04-14 NOTE — Progress Notes (Signed)
Katelyn [name] is a 60 y.o. female who presents with   Chief Complaint   Patient presents with   ??? Hypertension     does not check BP at home, denies headaches or dizziness   ??? Cholesterol Problem     pt is fasting       History of Present Illness    BP has been doing well at home.  No CP or SOB.  No headaches or edema.  No side effects with medications.  Exercising regularly.     Review of Systems  Review of Systems   Constitutional: Negative for chills, fever and malaise/fatigue.   HENT: Negative for congestion and hearing loss.    Eyes: Negative for blurred vision, pain and discharge.   Respiratory: Negative for cough, hemoptysis, shortness of breath and wheezing.    Cardiovascular: Negative for chest pain, palpitations and leg swelling.   Gastrointestinal: Negative for abdominal pain, blood in stool, constipation, diarrhea, heartburn and nausea.   Genitourinary: Negative for dysuria, frequency, hematuria and urgency.   Musculoskeletal: Negative for joint pain and myalgias.   Skin: Negative for rash.   Neurological: Negative for dizziness, sensory change and headaches.   Endo/Heme/Allergies: Negative for polydipsia. Does not bruise/bleed easily.   Psychiatric/Behavioral: Negative for depression. The patient is not nervous/anxious and does not have insomnia.        Medications  Current Outpatient Prescriptions   Medication Sig   ??? ferrous sulfate (IRON) 325 mg (65 mg iron) tablet Take  by mouth Daily (before breakfast).   ??? amLODIPine (NORVASC) 10 mg tablet Take 1 Tab by mouth daily.   ??? losartan-hydrochlorothiazide (HYZAAR) 100-25 mg per tablet Take 1 Tab by mouth daily.   ??? simvastatin (ZOCOR) 40 mg tablet Take 0.5 Tabs by mouth nightly.   ??? fluticasone (FLONASE) 50 mcg/actuation nasal spray 2 Sprays by Both Nostrils route once.   ??? aspirin delayed-release 81 mg tablet Take  by mouth daily.   ??? calcium-cholecalciferol, d3, (CALCIUM 600 + D) 600-125 mg-unit tab Take  by mouth two (2) times a day.    ??? cetirizine (ZYRTEC) 10 mg tablet Take  by mouth.     No current facility-administered medications for this visit.        Past Medical History  Past Medical History:   Diagnosis Date   ??? Cancer ([place])     melanoma - back   ??? Hypercholesterolemia    ??? Hypertension    ??? Osteopenia        Surgical History  Past Surgical History:   Procedure Laterality Date   ??? HX COLONOSCOPY     ??? HX GYN      c-section x 5          Family History  Family History   Problem Relation Age of Onset   ??? Hypertension Mother    ??? Elevated Lipids Mother    ??? Elevated Lipids Father    ??? Hypertension Father    ??? Arthritis-osteo Father      spine   ??? Heart Disease Father    ??? Breast Cancer Other        Social History  Social History     Social History   ??? Marital status: MARRIED     Spouse name: N/A   ??? Number of children: N/A   ??? Years of education: N/A     Occupational History   ??? Not on file.     Social History Main Topics   ???  Smoking status: Never Smoker   ??? Smokeless tobacco: Never Used   ??? Alcohol use No   ??? Drug use: Not on file   ??? Sexual activity: Not on file     Other Topics Concern   ??? Not on file     Social History Narrative       History   Smoking Status   ??? Never Smoker   Smokeless Tobacco   ??? Never Used       Allergies  No Known Allergies    Vital Signs  Body mass index is 23.78 kg/(m^2).  Vitals:    04/14/16 1127   BP: 116/64   Pulse: 73   SpO2: 97%   Weight: 146 lb 3.2 oz (66.3 kg)       Physical Exam  Physical Exam   Constitutional: Katelyn [name] is well-developed, well-nourished, and in no distress.   HENT:   Right Ear: Tympanic membrane normal.   Left Ear: Tympanic membrane normal.   Mouth/Throat: No oropharyngeal exudate.   Neck: No thyromegaly present.   Cardiovascular: Normal rate, regular rhythm, normal heart sounds and normal pulses.  Exam reveals no gallop and no friction rub.    No murmur heard.  Pulmonary/Chest: Breath sounds normal. No respiratory distress. Katelyn [name] has no wheezes. Katelyn [name] has no rales.    Musculoskeletal: Katelyn [name] exhibits no edema.   Lymphadenopathy:     Katelyn [name] has no cervical adenopathy.   Skin: Skin is warm and dry.       Assessment and Plan  Kylinn was seen today for hypertension and cholesterol problem.    Diagnoses and all orders for this visit:    Essential hypertension    Hyperlipidemia, unspecified hyperlipidemia type  -     Lipid Panel with LDL/HDL  -     CMP  -     Venipuncture  doing well  Cont current meds

## 2016-04-15 LAB — METABOLIC PANEL, COMPREHENSIVE
A-G Ratio: 1.7 (ref 1.2–2.2)
ALT (SGPT): 28 IU/L (ref 0–32)
AST (SGOT): 29 IU/L (ref 0–40)
Albumin: 4.3 g/dL (ref 3.5–5.5)
Alk. phosphatase: 78 IU/L (ref 39–117)
BUN/Creatinine ratio: 21 (ref 9–23)
BUN: 19 mg/dL (ref 6–24)
Bilirubin, total: 0.5 mg/dL (ref 0.0–1.2)
CO2: 26 mmol/L (ref 18–29)
Calcium: 9.3 mg/dL (ref 8.7–10.2)
Chloride: 98 mmol/L (ref 96–106)
Creatinine: 0.89 mg/dL (ref 0.57–1.00)
GFR est AA: 82 mL/min/{1.73_m2} (ref >59)
GFR est non-AA: 71 mL/min/{1.73_m2} (ref >59)
GLOBULIN, TOTAL: 2.6 g/dL (ref 1.5–4.5)
Glucose: 98 mg/dL (ref 65–99)
Potassium: 3.6 mmol/L (ref 3.5–5.2)
Protein, total: 6.9 g/dL (ref 6.0–8.5)
Sodium: 141 mmol/L (ref 134–144)

## 2016-04-15 LAB — LIPID PANEL WITH LDL/HDL RATIO
Cholesterol, total: 183 mg/dL (ref 100–199)
HDL Cholesterol: 79 mg/dL (ref >39)
LDL, calculated: 94 mg/dL (ref 0–99)
LDL/HDL Ratio: 1.2 ratio units (ref 0.0–3.2)
Triglyceride: 50 mg/dL (ref 0–149)
VLDL, calculated: 10 mg/dL (ref 5–40)

## 2016-04-15 NOTE — Progress Notes (Signed)
Lab letter mailed 04/15/16  pl

## 2016-04-22 ENCOUNTER — Inpatient Hospital Stay: Admit: 2016-04-22 | Payer: BLUE CROSS/BLUE SHIELD | Primary: Family Medicine

## 2016-04-22 NOTE — Other (Signed)
Patient verified name, DOB, and surgery as listed in [facility].    TYPE  CASE:lB  Orders per surgeon:were called for  Labs per surgeon:none at present time  Labs per anesthesia protocol: cmp results from 04/14/2016 results are in [facility]   EKG  :  EKG is not required per protocol      Patient provided with handouts including guide to surgery , transfusions, pain management and hand hygiene for the family and community. Pt verbalizes understanding of all pre-op instructions .  Instructed that family must be present in building at all times. Nothing to eat or drink after midnight the night prior to surgery.     Hibiclens and instructions given per hospital policy.      Instructed patient to continue  previous medications as prescribed prior to surgery and  to take the following medications the day of surgery according to anesthesia guidelines : none       Original medication prescription bottles were not visualized during patient appointment.     Continue all previous medications unless otherwise directed.      Instructed patient to hold  the following medications prior to surgery: all vitamins and supplements 7 days prior to surgery and all nsaids including ibuprofen,advil, aleve 5 days prior to surgery      Patient verbalized understanding of all instructions and provided all medical/health information to the best of their ability.

## 2016-04-23 ENCOUNTER — Ambulatory Visit: Payer: BLUE CROSS/BLUE SHIELD | Primary: Family Medicine

## 2016-04-23 NOTE — Other (Signed)
Received faxed surgeon orders.  Faxed to pharmacy and placed on chart.

## 2016-04-27 ENCOUNTER — Ambulatory Visit: Payer: No Typology Code available for payment source | Admitting: Internal Medicine

## 2016-04-27 ENCOUNTER — Ambulatory Visit: Payer: No Typology Code available for payment source

## 2016-04-27 ENCOUNTER — Other Ambulatory Visit: Payer: No Typology Code available for payment source

## 2016-04-30 ENCOUNTER — Inpatient Hospital Stay: Payer: BLUE CROSS/BLUE SHIELD

## 2016-04-30 ENCOUNTER — Encounter: Payer: Self-pay | Admitting: *Deleted

## 2016-04-30 ENCOUNTER — Other Ambulatory Visit: Payer: Self-pay | Admitting: *Deleted

## 2016-04-30 ENCOUNTER — Inpatient Hospital Stay: Payer: 59 | Attending: Internal Medicine

## 2016-04-30 DIAGNOSIS — D509 Iron deficiency anemia, unspecified: Secondary | ICD-10-CM

## 2016-04-30 LAB — POC SODIUM-POTASSIUM: Potassium, POC: 3.3 MMOL/L — ABNORMAL LOW (ref 3.5–5.1)

## 2016-04-30 MED ORDER — ROPIVACAINE (PF) 5 MG/ML (0.5 %) INJECTION
5 mg/mL (0. %) | INTRAMUSCULAR | Status: AC
Start: 2016-04-30 — End: ?

## 2016-04-30 MED ORDER — LACTATED RINGERS IV
INTRAVENOUS | Status: DC
Start: 2016-04-30 — End: 2016-04-30
  Administered 2016-04-30: 15:00:00 via INTRAVENOUS

## 2016-04-30 MED ORDER — MIDAZOLAM 1 MG/ML IJ SOLN
1 mg/mL | Freq: Once | INTRAMUSCULAR | Status: DC | PRN
Start: 2016-04-30 — End: 2016-04-30
  Administered 2016-04-30: 16:00:00 via INTRAVENOUS

## 2016-04-30 MED ORDER — LIDOCAINE HCL 1 % (10 MG/ML) IJ SOLN
10 mg/mL (1 %) | INTRAMUSCULAR | Status: DC | PRN
Start: 2016-04-30 — End: 2016-04-30

## 2016-04-30 MED ORDER — LIDOCAINE (PF) 20 MG/ML (2 %) IJ SOLN
20 mg/mL (2 %) | INTRAMUSCULAR | Status: AC
Start: 2016-04-30 — End: ?

## 2016-04-30 MED ORDER — FENTANYL CITRATE (PF) 50 MCG/ML IJ SOLN
50 mcg/mL | INTRAMUSCULAR | Status: AC
Start: 2016-04-30 — End: ?

## 2016-04-30 MED ORDER — KETOROLAC TROMETHAMINE 30 MG/ML INJECTION
30 mg/mL (1 mL) | INTRAMUSCULAR | Status: DC | PRN
Start: 2016-04-30 — End: 2016-04-30
  Administered 2016-04-30: 18:00:00 via INTRAVENOUS

## 2016-04-30 MED ORDER — SODIUM CHLORIDE 0.9 % IJ SYRG
Freq: Three times a day (TID) | INTRAMUSCULAR | Status: DC
Start: 2016-04-30 — End: 2016-04-30

## 2016-04-30 MED ORDER — ONDANSETRON (PF) 4 MG/2 ML INJECTION
4 mg/2 mL | INTRAMUSCULAR | Status: AC
Start: 2016-04-30 — End: ?

## 2016-04-30 MED ORDER — KETOROLAC TROMETHAMINE 30 MG/ML INJECTION
30 mg/mL (1 mL) | INTRAMUSCULAR | Status: AC
Start: 2016-04-30 — End: ?

## 2016-04-30 MED ORDER — PROPOFOL 10 MG/ML IV EMUL
10 mg/mL | INTRAVENOUS | Status: DC | PRN
Start: 2016-04-30 — End: 2016-04-30
  Administered 2016-04-30: 17:00:00 via INTRAVENOUS

## 2016-04-30 MED ORDER — FENTANYL CITRATE (PF) 50 MCG/ML IJ SOLN
50 mcg/mL | Freq: Once | INTRAMUSCULAR | Status: DC
Start: 2016-04-30 — End: 2016-04-30

## 2016-04-30 MED ORDER — SODIUM CHLORIDE 0.9 % IV
INTRAVENOUS | Status: DC
Start: 2016-04-30 — End: 2016-04-30

## 2016-04-30 MED ORDER — FENTANYL CITRATE (PF) 50 MCG/ML IJ SOLN
50 mcg/mL | INTRAMUSCULAR | Status: DC | PRN
Start: 2016-04-30 — End: 2016-04-30
  Administered 2016-04-30 (×2): via INTRAVENOUS

## 2016-04-30 MED ORDER — FAMOTIDINE 20 MG TAB
20 mg | Freq: Once | ORAL | Status: AC
Start: 2016-04-30 — End: 2016-04-30
  Administered 2016-04-30: 15:00:00 via ORAL

## 2016-04-30 MED ORDER — DEXAMETHASONE SODIUM PHOSPHATE 4 MG/ML IJ SOLN
4 mg/mL | INTRAMUSCULAR | Status: AC
Start: 2016-04-30 — End: ?

## 2016-04-30 MED ORDER — DEXAMETHASONE SODIUM PHOSPHATE 4 MG/ML IJ SOLN
4 mg/mL | INTRAMUSCULAR | Status: DC | PRN
Start: 2016-04-30 — End: 2016-04-30
  Administered 2016-04-30: 18:00:00 via INTRAVENOUS

## 2016-04-30 MED ORDER — HYDROMORPHONE (PF) 2 MG/ML IJ SOLN
2 mg/mL | INTRAMUSCULAR | Status: DC | PRN
Start: 2016-04-30 — End: 2016-04-30

## 2016-04-30 MED ORDER — PROPOFOL 10 MG/ML IV EMUL
10 mg/mL | INTRAVENOUS | Status: AC
Start: 2016-04-30 — End: ?

## 2016-04-30 MED ORDER — SODIUM CHLORIDE 0.9 % IJ SYRG
INTRAMUSCULAR | Status: DC | PRN
Start: 2016-04-30 — End: 2016-04-30

## 2016-04-30 MED ORDER — OXYCODONE 5 MG TAB
5 mg | Freq: Once | ORAL | Status: DC | PRN
Start: 2016-04-30 — End: 2016-04-30
  Administered 2016-04-30: 19:00:00 via ORAL

## 2016-04-30 MED ORDER — ROPIVACAINE (PF) 5 MG/ML (0.5 %) INJECTION
5 mg/mL (0. %) | INTRAMUSCULAR | Status: DC | PRN
Start: 2016-04-30 — End: 2016-04-30
  Administered 2016-04-30: 18:00:00

## 2016-04-30 MED ORDER — LIDOCAINE (PF) 20 MG/ML (2 %) IJ SOLN
20 mg/mL (2 %) | INTRAMUSCULAR | Status: DC | PRN
Start: 2016-04-30 — End: 2016-04-30
  Administered 2016-04-30: 17:00:00 via INTRAVENOUS

## 2016-04-30 MED ORDER — NALOXONE 0.4 MG/ML INJECTION
0.4 mg/mL | INTRAMUSCULAR | Status: DC | PRN
Start: 2016-04-30 — End: 2016-04-30

## 2016-04-30 MED ORDER — HYDROCODONE-ACETAMINOPHEN 7.5 MG-325 MG TAB
ORAL | Status: DC | PRN
Start: 2016-04-30 — End: 2016-04-30

## 2016-04-30 MED ORDER — ONDANSETRON (PF) 4 MG/2 ML INJECTION
4 mg/2 mL | INTRAMUSCULAR | Status: DC | PRN
Start: 2016-04-30 — End: 2016-04-30
  Administered 2016-04-30: 18:00:00 via INTRAVENOUS

## 2016-04-30 MED FILL — PROPOFOL 10 MG/ML IV EMUL: 10 mg/mL | INTRAVENOUS | Qty: 40

## 2016-04-30 MED FILL — DEXAMETHASONE SODIUM PHOSPHATE 4 MG/ML IJ SOLN: 4 mg/mL | INTRAMUSCULAR | Qty: 4

## 2016-04-30 MED FILL — DEXAMETHASONE SODIUM PHOSPHATE 4 MG/ML IJ SOLN: 4 mg/mL | INTRAMUSCULAR | Qty: 1

## 2016-04-30 MED FILL — MIDAZOLAM 1 MG/ML IJ SOLN: 1 mg/mL | INTRAMUSCULAR | Qty: 2

## 2016-04-30 MED FILL — KETOROLAC TROMETHAMINE 30 MG/ML INJECTION: 30 mg/mL (1 mL) | INTRAMUSCULAR | Qty: 1

## 2016-04-30 MED FILL — LIDOCAINE (PF) 20 MG/ML (2 %) IJ SOLN: 20 mg/mL (2 %) | INTRAMUSCULAR | Qty: 5

## 2016-04-30 MED FILL — PROPOFOL 10 MG/ML IV EMUL: 10 mg/mL | INTRAVENOUS | Qty: 200

## 2016-04-30 MED FILL — OXYCODONE 5 MG TAB: 5 mg | ORAL | Qty: 1

## 2016-04-30 MED FILL — NAROPIN (PF) 5 MG/ML (0.5 %) INJECTION SOLUTION: 5 mg/mL (0. %) | INTRAMUSCULAR | Qty: 30

## 2016-04-30 MED FILL — ONDANSETRON (PF) 4 MG/2 ML INJECTION: 4 mg/2 mL | INTRAMUSCULAR | Qty: 4

## 2016-04-30 MED FILL — XYLOCAINE-MPF 20 MG/ML (2 %) INJECTION SOLUTION: 20 mg/mL (2 %) | INTRAMUSCULAR | Qty: 60

## 2016-04-30 MED FILL — ONDANSETRON (PF) 4 MG/2 ML INJECTION: 4 mg/2 mL | INTRAMUSCULAR | Qty: 2

## 2016-04-30 MED FILL — FAMOTIDINE 20 MG TAB: 20 mg | ORAL | Qty: 1

## 2016-04-30 MED FILL — FENTANYL CITRATE (PF) 50 MCG/ML IJ SOLN: 50 mcg/mL | INTRAMUSCULAR | Qty: 2

## 2016-04-30 NOTE — Op Note (Signed)
[place]   OPERATIVE REPORT       Name:  Katelyn [name], Katelyn [name]   MR#:  [id number]   DOB:  October 15, 1956   Account #:  [id number]   Date of Adm:  04/30/2016       PREOPERATIVE DIAGNOSIS: Left medial meniscus tear.    POSTOPERATIVE DIAGNOSES   1. Tear of the posterior 1/2 of the left medial meniscus.   2. Grade 3 chondromalacia of the medial femoral condyle.   3. Flap tear of the lateral meniscus.   4. Loose bodies of the left knee.    OPERATION PERFORMED   1. Arthroscopic abrasion arthroplasty of the left knee.   2. Medial and lateral meniscectomy.   3. Removal of loose body of the left knee.    SURGEON: Rella Larve, MD    ANESTHESIA: General.    PROCEDURE: After an adequate level of general anesthesia was   obtained, the left leg was prepped and draped in the usual   sterile fashion. A tourniquet was used for the procedure. Photos   made for the patient. Anteromedial and anterolateral portals   made. Findings revealed normal tracking of the patella. There   was some mild chondromalacia involving the medial femoral   condyle. The patient did have some mild chondromalacia involving   the femoral sulcus at the patellofemoral joint. A chondroplasty   was performed. There was no eburnated bone. The ACL and the PCL   were intact. In the lateral compartment, the articular surface   looked fine, but there was a flap tear at the middle third   extending toward the posterior horn, resected with the baskets   and the shaver, and was left with a stable rim. There were loose   bodies present and photos made for the patient. One was   reasonably large, measuring 2 x 2 cm. This was removed with a   grasper. It was necessary to fragment it to facilitate in   removal. In the medial compartment was the main pathology where   the patient had a large degenerative tear of the posterior 1/2   of the medial meniscus, and the tear was resected with the   baskets and the shaver. The patient had about a quarter-sized   area of  grade 2 with mixed areas of grade 3 chondromalacia   centrally and some of the delaminating flaps were removed with   the shaver, and an abrasion arthroplasty was performed to   bleeding bone. This was most evident more on the anterior   portion of the lateral femoral condyle. Most bony contact was   when the knee was in full extension. The knee was then copiously   irrigated. Sterile dressings applied.        Rella Larve, MD      JRV / LE   D:  04/30/2016   14:00   T:  04/30/2016   14:13   Job #:  [id number]

## 2016-04-30 NOTE — H&P (Signed)
Outpatient Surgery History and Physical:  Katelyn [name] was seen and examined.    CHIEF COMPLAINT:    l knee .     PE:     Visit Vitals   ??? BP (!) 163/92 (BP 1 Location: Right arm, BP Patient Position: Sitting)   ??? Pulse 85   ??? Temp 98.7 ??F (37.1 ??C)   ??? Resp 16   ??? Wt 65 kg (143 lb 6.4 oz)   ??? SpO2 97%   ??? BMI 22.46 kg/m2       Heart:   Regular rhythm      Lungs:  Are clear      Past Medical History:    Patient Active Problem List    Diagnosis   ??? Osteopenia   ??? HTN (hypertension)   ??? Hyperlipidemia       Surgical History:   Past Surgical History:   Procedure Laterality Date   ??? HX COLONOSCOPY     ??? HX GYN      c-section x 5   ??? HX OTHER SURGICAL      melanoma removed from back       Social History: Patient  reports that she has never smoked. She has never used smokeless tobacco. She reports that she does not drink alcohol.    Family History:   Family History   Problem Relation Age of Onset   ??? Hypertension Mother    ??? Elevated Lipids Mother    ??? Elevated Lipids Father    ??? Hypertension Father    ??? Arthritis-osteo Father      spine   ??? Heart Disease Father    ??? Breast Cancer Other    ??? Diabetes Sister        Allergies: Reviewed per EMR  No Known Allergies    Medications:    No current facility-administered medications on file prior to encounter.      Current Outpatient Prescriptions on File Prior to Encounter   Medication Sig   ??? ferrous sulfate (IRON) 325 mg (65 mg iron) tablet Take  by mouth Daily (before breakfast).   ??? losartan-hydrochlorothiazide (HYZAAR) 100-25 mg per tablet Take 1 Tab by mouth daily.   ??? fluticasone (FLONASE) 50 mcg/actuation nasal spray 2 Sprays by Both Nostrils route nightly.   ??? aspirin delayed-release 81 mg tablet Take  by mouth nightly. Indications: prevention of transient ischemic attack   ??? calcium-cholecalciferol, d3, (CALCIUM 600 + D) 600-125 mg-unit tab Take  by mouth three (3) times daily.   ??? cetirizine (ZYRTEC) 10 mg tablet Take  by mouth.        The surgery is planned for the  Knee .        History and physical has been reviewed. The patient has been examined. There have been no significant clinical changes since the completion of the originally dated History and Physical.  Patient identified by surgeon; surgical site was confirmed by patient and surgeon.      The patient is here today for outpatient surgery. I have examined the patient, no changes are noted in the patient's medical status. Necessity for the procedure/care is still present and the history and physical above is current.  See the office notes for the full long term history of the problem.  Please see the recent office notes for the musculoskeletal examination.    Signed By: Rella Larve, MD     Apr 30, 2016 11:28 AM

## 2016-04-30 NOTE — Op Note (Signed)
[place]   OPERATIVE REPORT       Name:  Katelyn [name], Katelyn [name]   MR#:  [id number]   DOB:  07/19/1956   Account #:  [id number]   Date of Adm:  04/30/2016       PREOPERATIVE DIAGNOSIS: Left medial meniscus tear.    POSTOPERATIVE DIAGNOSES   1. Tear of the posterior 1/2 of the left medial meniscus.   2. Grade 3 chondromalacia of the medial femoral condyle.   3. Flap tear of the lateral meniscus.   4. Loose bodies of the left knee.    OPERATION PERFORMED   1. Arthroscopic abrasion arthroplasty of the left knee.   2. Medial and lateral meniscectomy.   3. Removal of loose body of the left knee.    SURGEON: Rella Larve, MD    ANESTHESIA: General.    PROCEDURE: After an adequate level of general anesthesia was   obtained, the left leg was prepped and draped in the usual   sterile fashion. A tourniquet was used for the procedure. Photos   made for the patient. Anteromedial and anterolateral portals   made. Findings revealed normal tracking of the patella. There   was some mild chondromalacia involving the medial femoral   condyle. The patient did have some mild chondromalacia involving   the femoral sulcus at the patellofemoral joint. A chondroplasty   was performed. There was no eburnated bone. The ACL and the PCL   were intact. In the lateral compartment, the articular surface   looked fine, but there was a flap tear at the middle third   extending toward the posterior horn, resected with the baskets   and the shaver, and was left with a stable rim. There were loose   bodies present and photos made for the patient. One was   reasonably large, measuring 2 x 2 cm. This was removed with a   grasper. It was necessary to fragment it to facilitate in   removal. In the medial compartment was the main pathology where   the patient had a large degenerative tear of the posterior 1/2   of the medial meniscus, and the tear was resected with the   baskets and the shaver. The patient had about a quarter-sized    area of grade 2 with mixed areas of grade 3 chondromalacia   centrally and some of the delaminating flaps were removed with   the shaver, and an abrasion arthroplasty was performed to   bleeding bone. This was most evident more on the anterior   portion of the lateral femoral condyle. Most bony contact was   when the knee was in full extension. The knee was then copiously   irrigated. Sterile dressings applied.        Rella Larve, MD      JRV / LE   D:  04/30/2016   14:00   T:  04/30/2016   14:13   Job #:  [id number]

## 2016-04-30 NOTE — Brief Op Note (Signed)
BRIEF OPERATIVE NOTE    Date of Procedure: 04/30/2016   Preoperative Diagnosis: Other tear of medial meniscus of left knee as current injury, initial encounter [S83.242A]  Primary osteoarthritis of left knee [M17.12]  Postoperative Diagnosis: tear of medial meniscus of left knee /Primary osteoarthritis of left knee    Procedure(s):  LEFT KNEE ARTHROSCOPY / MEDIAL MENISCECTOMY / ABRASION ARTHROPLASTY  Surgeon(s) and Role:     * Rella Larve, MD - Primary         Assistant Staff:       Surgical Staff:  Circ-1: Jacquelyne Balint, RN  Scrub Tech-1: Michae Kava  Event Time In   Incision Start 1342   Incision Close 1401     Anesthesia: General   Estimated Blood Loss:     Specimens: * No specimens in log *   Findings:      Complications:     Implants: * No implants in log *

## 2016-04-30 NOTE — Anesthesia Post-Procedure Evaluation (Signed)
Post-Anesthesia Evaluation and Assessment    Patient: Katelyn [name] MRN: [id number]  SSN: [id number]    Date of Birth: January 29, 1956  Age: 60 y.o.  Sex: female       Cardiovascular Function/Vital Signs  Visit Vitals   ??? BP 131/76   ??? Pulse 76   ??? Temp 36.5 ??C (97.7 ??F)   ??? Resp 30   ??? Wt 65 kg (143 lb 6.4 oz)   ??? SpO2 96%   ??? BMI 22.46 kg/m2       Patient is status post general anesthesia for Procedure(s):  LEFT KNEE ARTHROSCOPY / MEDIAL MENISCECTOMY / ABRASION ARTHROPLASTY.    Nausea/Vomiting: None    Postoperative hydration reviewed and adequate.    Pain:  Pain Scale 1: Numeric (0 - 10) (04/30/16 1451)  Pain Intensity 1: 3 (04/30/16 1451)   Managed    Neurological Status:   Neuro (WDL): Within Defined Limits (04/30/16 1451)  Neuro  Neurologic State: Alert (04/30/16 1451)   At baseline    Mental Status and Level of Consciousness: Arousable    Pulmonary Status:   O2 Device: Room air (04/30/16 1451)   Adequate oxygenation and airway patent    Complications related to anesthesia: None    Post-anesthesia assessment completed. No concerns    Signed By: Sherald Hess, MD     Apr 30, 2016

## 2016-04-30 NOTE — Anesthesia Pre-Procedure Evaluation (Addendum)
Anesthetic History     PONV          Review of Systems / Medical History      Pulmonary                   Neuro/Psych              Cardiovascular    Hypertension              Exercise tolerance: >4 METS     GI/Hepatic/Renal                Endo/Other             Other Findings              Physical Exam    Airway  Mallampati: II  TM Distance: > 6 cm  Neck ROM: normal range of motion   Mouth opening: Normal     Cardiovascular  Regular rate and rhythm,  S1 and S2 normal,  no murmur, click, rub, or gallop             Dental  No notable dental hx       Pulmonary  Breath sounds clear to auscultation               Abdominal         Other Findings            Anesthetic Plan    ASA: 2  Anesthesia type: general            Anesthetic plan and risks discussed with: Patient

## 2016-05-04 ENCOUNTER — Inpatient Hospital Stay: Payer: 59 | Admitting: Internal Medicine

## 2016-05-04 ENCOUNTER — Inpatient Hospital Stay: Payer: 59

## 2016-05-12 ENCOUNTER — Ambulatory Visit: Payer: Self-pay | Admitting: Physician Assistant

## 2016-05-12 DIAGNOSIS — E538 Deficiency of other specified B group vitamins: Secondary | ICD-10-CM

## 2016-05-12 MED ORDER — CYANOCOBALAMIN 1000 MCG/ML IJ SOLN
1000.0000 ug | Freq: Once | INTRAMUSCULAR | Status: AC
  Administered 2016-05-12: 1000 ug via INTRAMUSCULAR

## 2016-05-19 ENCOUNTER — Other Ambulatory Visit: Payer: Self-pay | Admitting: Emergency Medicine

## 2016-05-19 MED ORDER — DICLOFENAC SODIUM 1 % TD GEL: TRANSDERMAL | Status: DC

## 2016-05-19 NOTE — Telephone Encounter (Signed)
I received a faxed request from [facility] for a refill for Tramadol and Voltaeran Gel.

## 2016-05-19 NOTE — Telephone Encounter (Signed)
Med refill for voltaren approved, pt needs appointment for tramadol or we can call it in, cannot e-scribe due to scheduled drug

## 2016-06-11 ENCOUNTER — Ambulatory Visit (INDEPENDENT_AMBULATORY_CARE_PROVIDER_SITE_OTHER): Payer: 59 | Admitting: Family Medicine

## 2016-06-11 ENCOUNTER — Encounter: Payer: Self-pay | Admitting: Family Medicine

## 2016-06-11 VITALS — BP 134/84 | HR 87 | Temp 98.6°F | Ht 61.25 in | Wt 248.4 lb

## 2016-06-11 DIAGNOSIS — Z9884 Bariatric surgery status: Secondary | ICD-10-CM | POA: Diagnosis not present

## 2016-06-11 DIAGNOSIS — Z1239 Encounter for other screening for malignant neoplasm of breast: Secondary | ICD-10-CM | POA: Diagnosis not present

## 2016-06-11 DIAGNOSIS — Z0000 Encounter for general adult medical examination without abnormal findings: Secondary | ICD-10-CM

## 2016-06-11 DIAGNOSIS — Z6841 Body Mass Index (BMI) 40.0 and over, adult: Secondary | ICD-10-CM

## 2016-06-11 DIAGNOSIS — F418 Other specified anxiety disorders: Secondary | ICD-10-CM

## 2016-06-11 DIAGNOSIS — Z0001 Encounter for general adult medical examination with abnormal findings: Secondary | ICD-10-CM | POA: Insufficient documentation

## 2016-06-11 DIAGNOSIS — F32A Depression, unspecified: Secondary | ICD-10-CM | POA: Insufficient documentation

## 2016-06-11 DIAGNOSIS — F419 Anxiety disorder, unspecified: Secondary | ICD-10-CM

## 2016-06-11 DIAGNOSIS — F329 Major depressive disorder, single episode, unspecified: Secondary | ICD-10-CM | POA: Insufficient documentation

## 2016-06-11 LAB — LIPID PANEL
CHOLESTEROL: 158 mg/dL (ref 0–200)
HDL: 67.3 mg/dL (ref >39.00)
LDL Cholesterol: 77 mg/dL (ref 0–99)
NONHDL: 90.63
TRIGLYCERIDES: 69 mg/dL (ref 0.0–149.0)
Total CHOL/HDL Ratio: 2
VLDL: 13.8 mg/dL (ref 0.0–40.0)

## 2016-06-11 LAB — IBC PANEL
IRON: 76 ug/dL (ref 42–145)
SATURATION RATIOS: 19.7 % — AB (ref 20.0–50.0)
TRANSFERRIN: 275 mg/dL (ref 212.0–360.0)

## 2016-06-11 LAB — CBC
HEMATOCRIT: 38.4 % (ref 36.0–46.0)
HEMOGLOBIN: 12.4 g/dL (ref 12.0–15.0)
MCHC: 32.4 g/dL (ref 30.0–36.0)
MCV: 90 fl (ref 78.0–100.0)
PLATELETS: 192 10*3/uL (ref 150.0–400.0)
RBC: 4.27 Mil/uL (ref 3.87–5.11)
RDW: 14.4 % (ref 11.5–15.5)
WBC: 4.3 10*3/uL (ref 4.0–10.5)

## 2016-06-11 LAB — COMPREHENSIVE METABOLIC PANEL
ALBUMIN: 4.1 g/dL (ref 3.5–5.2)
ALK PHOS: 76 U/L (ref 39–117)
ALT: 12 U/L (ref 0–35)
AST: 17 U/L (ref 0–37)
BILIRUBIN TOTAL: 0.4 mg/dL (ref 0.2–1.2)
BUN: 17 mg/dL (ref 6–23)
CALCIUM: 9.2 mg/dL (ref 8.4–10.5)
CO2: 31 mEq/L (ref 19–32)
Chloride: 108 mEq/L (ref 96–112)
Creatinine, Ser: 0.86 mg/dL (ref 0.40–1.20)
GFR: 71.57 mL/min (ref >60.00)
GLUCOSE: 67 mg/dL — AB (ref 70–99)
POTASSIUM: 4 meq/L (ref 3.5–5.1)
Sodium: 142 mEq/L (ref 135–145)
TOTAL PROTEIN: 6.6 g/dL (ref 6.0–8.3)

## 2016-06-11 LAB — FOLATE: Folate: >23.7 ng/mL (ref >5.9)

## 2016-06-11 LAB — FERRITIN: Ferritin: 17.1 ng/mL (ref 10.0–291.0)

## 2016-06-11 LAB — TSH: TSH: 2.53 u[IU]/mL (ref 0.35–4.50)

## 2016-06-11 LAB — IRON: Iron: 76 ug/dL (ref 42–145)

## 2016-06-11 LAB — VITAMIN B12: VITAMIN B 12: 433 pg/mL (ref 211–911)

## 2016-06-11 LAB — VITAMIN D 25 HYDROXY (VIT D DEFICIENCY, FRACTURES): VITD: 10.43 ng/mL — ABNORMAL LOW (ref 30.00–100.00)

## 2016-06-11 MED ORDER — SERTRALINE HCL 50 MG PO TABS: 50.0000 mg | ORAL_TABLET | Freq: Every day | ORAL | Status: DC

## 2016-06-11 NOTE — Progress Notes (Signed)
Pre visit review using our clinic review tool, if applicable. No additional management support is needed unless otherwise documented below in the visit note. 

## 2016-06-11 NOTE — Assessment & Plan Note (Signed)
Pap smear not needed/indicated. Mammogram ordered.  Colonoscopy up-to-date. Tetanus up-to-date. Screening labs today, including those for gastric bypass.

## 2016-06-11 NOTE — Progress Notes (Signed)
Subjective:  Patient ID: [name], female    DOB: 10-Dec-1956  Age: 60 y.o. MRN: [id number]  CC: Establish care  HPI [name] is a 60 y.o. female presents to the clinic today to establish care. Additional concern below.  Preventative Healthcare  Pap smear: Not indicated as she is s/p Hysterectomy for benign reasons.  Mammogram: In need of. Will order & schedule.  Colonoscopy: Up to date. 2009.  Immunizations  Tetanus - Up to date. 2010.  Pneumococcal - N/A.   Flu - Not indicated at this time.  Zoster - N/A.  Hepatitis C screening - Candidate for.  Labs: Screening labs today.  Alcohol use: See below.  Smoking/tobacco use: Nonsmoker.  Regular dental exams: Yes.   Wears seat belt: Yes.   Stress  Patient has a history of depression/anxiety and is under a great deal of stress (mostly due to the poor health of her husband).  She states that she is anxious.  Anxiety (and likely depression) interfering with her everyday life.  She would like to discuss restarting Zoloft as she feels that this will help her cope.  PMH, Surgical Hx, Family Hx, Social History reviewed and updated as below.  Past Medical History  Diagnosis Date  . IDA (iron deficiency anemia)   . History of hemorrhoids   . Chicken pox   . Depression   . Arthritis   . Kidney stones   . Morbid obesity ([place])     s/p Gastric bypass  . Heartburn   . History of blood transfusion    Past Surgical History  Procedure Laterality Date  . Cesarean section  1987  . Gastric bypass  2007  . Abdominal hysterectomy  1997  . Hernia repair  2011  . Oophorectomy    . Tonsillectomy and adenoidectomy    . Cholecystectomy    . Cervical spine surgery      Fusion  . 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   Family History  Problem Relation Age of Onset  . Arthritis Mother   . Arthritis Father   . Lung cancer Father   . Arthritis Maternal Grandmother   . Kidney  disease Maternal Grandmother   . Depression Maternal Grandmother    Social History  Substance Use Topics  . Smoking status: Never Smoker   . Smokeless tobacco: Never Used  . Alcohol Use: 0.0 - 0.6 oz/week    0-1 Standard drinks or equivalent per week   Review of Systems  Eyes: Positive for visual disturbance.  Cardiovascular: Positive for leg swelling.  Musculoskeletal: Positive for arthralgias.  Psychiatric/Behavioral:       Stress.  All other systems reviewed and are negative.  Objective:   Today's Vitals: BP 134/84 mmHg  Pulse 87  Temp(Src) 98.6 F (37 C) (Oral)  Ht 5' 1.25" (1.556 m)  Wt 248 lb 6 oz (112.662 kg)  BMI 46.53 kg/m2  SpO2 98%  Physical Exam  Constitutional: She is oriented to person, place, and time. She appears well-developed. No distress.  Morbidly obese.  HENT:  Head: Normocephalic and atraumatic.  Nose: Nose normal.  Mouth/Throat: Oropharynx is clear and moist. No oropharyngeal exudate.  Normal TM's bilaterally.   Eyes: Conjunctivae are normal. No scleral icterus.  Neck: Neck supple. No thyromegaly present.  Cardiovascular: Normal rate and regular rhythm.   No murmur heard. Pulmonary/Chest: Effort normal and breath sounds normal. She has no wheezes. She has no rales.  Abdominal:  Soft. She exhibits no distension. There is no tenderness. There is no rebound and no guarding.  Musculoskeletal: Normal range of motion. She exhibits no edema.  Lymphadenopathy:    She has no cervical adenopathy.  Neurological: She is alert and oriented to person, place, and time.  Skin: Skin is warm and dry. No rash noted.  Psychiatric: She has a normal mood and affect.  Vitals reviewed.  Assessment & Plan:   Problem List Items Addressed This Visit    S/P gastric bypass   Relevant Orders   CBC   Comp Met (CMET)   Lipid Profile   TSH   Ferritin   IBC panel   Iron   Iron Binding Cap (TIBC)   Folate   B12   Vitamin D (25 hydroxy)   Vitamin B1   Morbid  obesity with BMI of 45.0-49.9, adult (HCC)   Anxiety and depression    New problem (to me). Starting on Zoloft 50 mg daily.      Encounter for preventative adult health care exam with abnormal findings - Primary    Pap smear not needed/indicated. Mammogram ordered.  Colonoscopy up-to-date. Tetanus up-to-date. Screening labs today, including those for gastric bypass.       Other Visit Diagnoses    Screening for breast cancer        Relevant Orders    MM DIGITAL SCREENING BILATERAL       Outpatient Encounter Prescriptions as of 06/11/2016  Medication Sig  . cyanocobalamin (,VITAMIN B-12,) 1000 MCG/ML injection Inject 1 mL (1,000 mcg total) into the muscle once.  . sertraline (ZOLOFT) 50 MG tablet Take 1 tablet (50 mg total) by mouth daily.  . [DISCONTINUED] diclofenac sodium (VOLTAREN) 1 % GEL Apply 4 grams to joints below the waist, 2 grams to upper extremeties   No facility-administered encounter medications on file as of 06/11/2016.   Follow-up: 6 months.  [place]

## 2016-06-11 NOTE — Patient Instructions (Signed)
We will call with your lab results.  Take the zoloft as prescribed. We can increase after 6 weeks if needed.  Follow up in 6 months or sooner if needed.  Take care  Dr. Lacinda Axon   Health Maintenance, Female Adopting a healthy lifestyle and getting preventive care can go a long way to promote health and wellness. Talk with your health care provider about what schedule of regular examinations is right for you. This is a good chance for you to check in with your provider about disease prevention and staying healthy. In between checkups, there are plenty of things you can do on your own. Experts have done a lot of research about which lifestyle changes and preventive measures are most likely to keep you healthy. Ask your health care provider for more information. WEIGHT AND DIET  Eat a healthy diet  Be sure to include plenty of vegetables, fruits, low-fat dairy products, and lean protein.  Do not eat a lot of foods high in solid fats, added sugars, or salt.  Get regular exercise. This is one of the most important things you can do for your health.  Most adults should exercise for at least 150 minutes each week. The exercise should increase your heart rate and make you sweat (moderate-intensity exercise).  Most adults should also do strengthening exercises at least twice a week. This is in addition to the moderate-intensity exercise.  Maintain a healthy weight  Body mass index (BMI) is a measurement that can be used to identify possible weight problems. It estimates body fat based on height and weight. Your health care provider can help determine your BMI and help you achieve or maintain a healthy weight.  For females 72 years of age and older:   A BMI below 18.5 is considered underweight.  A BMI of 18.5 to 24.9 is normal.  A BMI of 25 to 29.9 is considered overweight.  A BMI of 30 and above is considered obese.  Watch levels of cholesterol and blood lipids  You should start having  your blood tested for lipids and cholesterol at 60 years of age, then have this test every 5 years.  You may need to have your cholesterol levels checked more often if:  Your lipid or cholesterol levels are high.  You are older than 60 years of age.  You are at high risk for heart disease.  CANCER SCREENING   Lung Cancer  Lung cancer screening is recommended for adults 78-26 years old who are at high risk for lung cancer because of a history of smoking.  A yearly low-dose CT scan of the lungs is recommended for people who:  Currently smoke.  Have quit within the past 15 years.  Have at least a 30-pack-year history of smoking. A pack year is smoking an average of one pack of cigarettes a day for 1 year.  Yearly screening should continue until it has been 15 years since you quit.  Yearly screening should stop if you develop a health problem that would prevent you from having lung cancer treatment.  Breast Cancer  Practice breast self-awareness. This means understanding how your breasts normally appear and feel.  It also means doing regular breast self-exams. Let your health care provider know about any changes, no matter how small.  If you are in your 20s or 30s, you should have a clinical breast exam (CBE) by a health care provider every 1-3 years as part of a regular health exam.  If you are  30 or older, have a CBE every year. Also consider having a breast X-ray (mammogram) every year.  If you have a family history of breast cancer, talk to your health care provider about genetic screening.  If you are at high risk for breast cancer, talk to your health care provider about having an MRI and a mammogram every year.  Breast cancer gene (BRCA) assessment is recommended for women who have family members with BRCA-related cancers. BRCA-related cancers include:  Breast.  Ovarian.  Tubal.  Peritoneal cancers.  Results of the assessment will determine the need for genetic  counseling and BRCA1 and BRCA2 testing. Cervical Cancer Your health care provider may recommend that you be screened regularly for cancer of the pelvic organs (ovaries, uterus, and vagina). This screening involves a pelvic examination, including checking for microscopic changes to the surface of your cervix (Pap test). You may be encouraged to have this screening done every 3 years, beginning at age 23.  For women ages 41-65, health care providers may recommend pelvic exams and Pap testing every 3 years, or they may recommend the Pap and pelvic exam, combined with testing for human papilloma virus (HPV), every 5 years. Some types of HPV increase your risk of cervical cancer. Testing for HPV may also be done on women of any age with unclear Pap test results.  Other health care providers may not recommend any screening for nonpregnant women who are considered low risk for pelvic cancer and who do not have symptoms. Ask your health care provider if a screening pelvic exam is right for you.  If you have had past treatment for cervical cancer or a condition that could lead to cancer, you need Pap tests and screening for cancer for at least 20 years after your treatment. If Pap tests have been discontinued, your risk factors (such as having a new sexual partner) need to be reassessed to determine if screening should resume. Some women have medical problems that increase the chance of getting cervical cancer. In these cases, your health care provider may recommend more frequent screening and Pap tests. Colorectal Cancer  This type of cancer can be detected and often prevented.  Routine colorectal cancer screening usually begins at 60 years of age and continues through 60 years of age.  Your health care provider may recommend screening at an earlier age if you have risk factors for colon cancer.  Your health care provider may also recommend using home test kits to check for hidden blood in the stool.  A  small camera at the end of a tube can be used to examine your colon directly (sigmoidoscopy or colonoscopy). This is done to check for the earliest forms of colorectal cancer.  Routine screening usually begins at age 65.  Direct examination of the colon should be repeated every 5-10 years through 60 years of age. However, you may need to be screened more often if early forms of precancerous polyps or small growths are found. Skin Cancer  Check your skin from head to toe regularly.  Tell your health care provider about any new moles or changes in moles, especially if there is a change in a mole's shape or color.  Also tell your health care provider if you have a mole that is larger than the size of a pencil eraser.  Always use sunscreen. Apply sunscreen liberally and repeatedly throughout the day.  Protect yourself by wearing long sleeves, pants, a wide-brimmed hat, and sunglasses whenever you are outside. HEART  DISEASE, DIABETES, AND HIGH BLOOD PRESSURE   High blood pressure causes heart disease and increases the risk of stroke. High blood pressure is more likely to develop in:  People who have blood pressure in the high end of the normal range (130-139/85-89 mm Hg).  People who are overweight or obese.  People who are African American.  If you are 21-31 years of age, have your blood pressure checked every 3-5 years. If you are 67 years of age or older, have your blood pressure checked every year. You should have your blood pressure measured twice--once when you are at a hospital or clinic, and once when you are not at a hospital or clinic. Record the average of the two measurements. To check your blood pressure when you are not at a hospital or clinic, you can use:  An automated blood pressure machine at a pharmacy.  A home blood pressure monitor.  If you are between 34 years and 65 years old, ask your health care provider if you should take aspirin to prevent strokes.  Have  regular diabetes screenings. This involves taking a blood sample to check your fasting blood sugar level.  If you are at a normal weight and have a low risk for diabetes, have this test once every three years after 60 years of age.  If you are overweight and have a high risk for diabetes, consider being tested at a younger age or more often. PREVENTING INFECTION  Hepatitis B  If you have a higher risk for hepatitis B, you should be screened for this virus. You are considered at high risk for hepatitis B if:  You were born in a country where hepatitis B is common. Ask your health care provider which countries are considered high risk.  Your parents were born in a high-risk country, and you have not been immunized against hepatitis B (hepatitis B vaccine).  You have HIV or AIDS.  You use needles to inject street drugs.  You live with someone who has hepatitis B.  You have had sex with someone who has hepatitis B.  You get hemodialysis treatment.  You take certain medicines for conditions, including cancer, organ transplantation, and autoimmune conditions. Hepatitis C  Blood testing is recommended for:  Everyone born from [birth year] through 1965.  Anyone with known risk factors for hepatitis C. Sexually transmitted infections (STIs)  You should be screened for sexually transmitted infections (STIs) including gonorrhea and chlamydia if:  You are sexually active and are younger than 60 years of age.  You are older than 60 years of age and your health care provider tells you that you are at risk for this type of infection.  Your sexual activity has changed since you were last screened and you are at an increased risk for chlamydia or gonorrhea. Ask your health care provider if you are at risk.  If you do not have HIV, but are at risk, it may be recommended that you take a prescription medicine daily to prevent HIV infection. This is called pre-exposure prophylaxis (PrEP). You are  considered at risk if:  You are sexually active and do not regularly use condoms or know the HIV status of your partner(s).  You take drugs by injection.  You are sexually active with a partner who has HIV. Talk with your health care provider about whether you are at high risk of being infected with HIV. If you choose to begin PrEP, you should first be tested for HIV. You should  then be tested every 3 months for as long as you are taking PrEP.  PREGNANCY   If you are premenopausal and you may become pregnant, ask your health care provider about preconception counseling.  If you may become pregnant, take 400 to 800 micrograms (mcg) of folic acid every day.  If you want to prevent pregnancy, talk to your health care provider about birth control (contraception). OSTEOPOROSIS AND MENOPAUSE   Osteoporosis is a disease in which the bones lose minerals and strength with aging. This can result in serious bone fractures. Your risk for osteoporosis can be identified using a bone density scan.  If you are 42 years of age or older, or if you are at risk for osteoporosis and fractures, ask your health care provider if you should be screened.  Ask your health care provider whether you should take a calcium or vitamin D supplement to lower your risk for osteoporosis.  Menopause may have certain physical symptoms and risks.  Hormone replacement therapy may reduce some of these symptoms and risks. Talk to your health care provider about whether hormone replacement therapy is right for you.  HOME CARE INSTRUCTIONS   Schedule regular health, dental, and eye exams.  Stay current with your immunizations.   Do not use any tobacco products including cigarettes, chewing tobacco, or electronic cigarettes.  If you are pregnant, do not drink alcohol.  If you are breastfeeding, limit how much and how often you drink alcohol.  Limit alcohol intake to no more than 1 drink per day for nonpregnant women. One  drink equals 12 ounces of beer, 5 ounces of wine, or 1 ounces of hard liquor.  Do not use street drugs.  Do not share needles.  Ask your health care provider for help if you need support or information about quitting drugs.  Tell your health care provider if you often feel depressed.  Tell your health care provider if you have ever been abused or do not feel safe at home.   This information is not intended to replace advice given to you by your health care provider. Make sure you discuss any questions you have with your health care provider.   Document Released: 06/22/2011 Document Revised: 12/28/2014 Document Reviewed: 11/08/2013 Elsevier Interactive Patient Education [employer].

## 2016-06-11 NOTE — Assessment & Plan Note (Signed)
New problem (to me). Starting on Zoloft 50 mg daily.

## 2016-06-12 LAB — IRON AND TIBC
%SAT: 23 % (ref 11–50)
Iron: 77 ug/dL (ref 45–160)
TIBC: 338 ug/dL (ref 250–450)
UIBC: 261 ug/dL (ref 125–400)

## 2016-06-15 ENCOUNTER — Other Ambulatory Visit: Payer: Self-pay | Admitting: Family Medicine

## 2016-06-15 MED ORDER — VITAMIN D (ERGOCALCIFEROL) 1.25 MG (50000 UNIT) PO CAPS: 50000.0000 [IU] | ORAL_CAPSULE | ORAL | Status: DC

## 2016-06-16 ENCOUNTER — Ambulatory Visit: Payer: Self-pay | Admitting: Physician Assistant

## 2016-06-16 DIAGNOSIS — E538 Deficiency of other specified B group vitamins: Secondary | ICD-10-CM

## 2016-06-16 LAB — VITAMIN B1: Vitamin B1 (Thiamine): 19 nmol/L (ref 8–30)

## 2016-06-16 MED ORDER — CYANOCOBALAMIN 1000 MCG/ML IJ SOLN
1000.0000 ug | Freq: Once | INTRAMUSCULAR | Status: AC
  Administered 2016-06-16: 1000 ug via INTRAMUSCULAR

## 2016-06-16 NOTE — Progress Notes (Signed)
Patient ID: Helen BalboaCynthia L Kocourek, female   DOB: 03/20/1956, 60 y.o.   MRN: [id number] Patient came in to have her monthly b12 injection.

## 2016-06-16 NOTE — Progress Notes (Signed)
Here for b12 injection

## 2016-06-24 ENCOUNTER — Ambulatory Visit
Admission: RE | Admit: 2016-06-24 | Discharge: 2016-06-24 | Disposition: A | Discharge status: Home / Self Care | Payer: 59 | Source: Ambulatory Visit | Attending: Family Medicine | Admitting: Family Medicine

## 2016-06-24 ENCOUNTER — Other Ambulatory Visit: Payer: Self-pay | Admitting: Family Medicine

## 2016-06-24 DIAGNOSIS — Z1239 Encounter for other screening for malignant neoplasm of breast: Secondary | ICD-10-CM

## 2016-06-24 DIAGNOSIS — Z1231 Encounter for screening mammogram for malignant neoplasm of breast: Secondary | ICD-10-CM | POA: Diagnosis not present

## 2016-06-25 ENCOUNTER — Ambulatory Visit: Payer: Self-pay | Admitting: Physician Assistant

## 2016-06-25 ENCOUNTER — Encounter: Payer: Self-pay | Admitting: Physician Assistant

## 2016-06-25 VITALS — BP 130/90 | HR 64 | Temp 98.4°F

## 2016-06-25 DIAGNOSIS — H1030 Unspecified acute conjunctivitis, unspecified eye: Secondary | ICD-10-CM

## 2016-06-25 DIAGNOSIS — W57XXXA Bitten or stung by nonvenomous insect and other nonvenomous arthropods, initial encounter: Secondary | ICD-10-CM

## 2016-06-25 MED ORDER — MUPIROCIN 2 % EX OINT: TOPICAL_OINTMENT | CUTANEOUS | Status: DC

## 2016-06-25 MED ORDER — TOBRAMYCIN 0.3 % OP SOLN
2.0000 [drp] | OPHTHALMIC | Status: DC
Start: 2016-06-25 — End: 2016-07-24

## 2016-06-25 NOTE — Progress Notes (Signed)
S: c/o left eye being pink, red, matted and gooey, started yesterday, also has bug bite on left forearm, a little warm to touch  O: vitals wnl, nad, perrl eomi, conjunctiva on left injected, no drainage at this time, lungs c t a, cv rrr, skin on left forearm with small pea sized bump  A: acute conjunctivitis, bug bite  P: tobramycin, bactroban

## 2016-07-21 ENCOUNTER — Other Ambulatory Visit: Payer: Self-pay | Admitting: Internal Medicine

## 2016-07-21 ENCOUNTER — Inpatient Hospital Stay: Payer: Managed Care, Other (non HMO) | Attending: Internal Medicine | Admitting: *Deleted

## 2016-07-21 ENCOUNTER — Ambulatory Visit: Payer: Self-pay | Admitting: Physician Assistant

## 2016-07-21 ENCOUNTER — Encounter: Payer: Self-pay | Admitting: Physician Assistant

## 2016-07-21 DIAGNOSIS — D508 Other iron deficiency anemias: Secondary | ICD-10-CM | POA: Diagnosis not present

## 2016-07-21 DIAGNOSIS — E538 Deficiency of other specified B group vitamins: Secondary | ICD-10-CM | POA: Diagnosis not present

## 2016-07-21 DIAGNOSIS — K909 Intestinal malabsorption, unspecified: Secondary | ICD-10-CM | POA: Insufficient documentation

## 2016-07-21 DIAGNOSIS — Z79899 Other long term (current) drug therapy: Secondary | ICD-10-CM | POA: Insufficient documentation

## 2016-07-21 DIAGNOSIS — R5383 Other fatigue: Secondary | ICD-10-CM | POA: Diagnosis not present

## 2016-07-21 DIAGNOSIS — Z9884 Bariatric surgery status: Secondary | ICD-10-CM | POA: Insufficient documentation

## 2016-07-21 DIAGNOSIS — M1990 Unspecified osteoarthritis, unspecified site: Secondary | ICD-10-CM | POA: Insufficient documentation

## 2016-07-21 DIAGNOSIS — D509 Iron deficiency anemia, unspecified: Secondary | ICD-10-CM

## 2016-07-21 DIAGNOSIS — Z880 Allergy status to penicillin: Secondary | ICD-10-CM | POA: Insufficient documentation

## 2016-07-21 LAB — BASIC METABOLIC PANEL
ANION GAP: 6 (ref 5–15)
BUN: 22 mg/dL — ABNORMAL HIGH (ref 6–20)
CHLORIDE: 106 mmol/L (ref 101–111)
CO2: 25 mmol/L (ref 22–32)
Calcium: 8.9 mg/dL (ref 8.9–10.3)
Creatinine, Ser: 0.94 mg/dL (ref 0.44–1.00)
GFR calc Af Amer: >60 mL/min (ref >60)
Glucose, Bld: 150 mg/dL — ABNORMAL HIGH (ref 65–99)
POTASSIUM: 3.7 mmol/L (ref 3.5–5.1)
SODIUM: 137 mmol/L (ref 135–145)

## 2016-07-21 LAB — IRON AND TIBC
IRON: 32 ug/dL (ref 28–170)
SATURATION RATIOS: 10 % — AB (ref 10.4–31.8)
TIBC: 338 ug/dL (ref 250–450)
UIBC: 306 ug/dL

## 2016-07-21 LAB — CBC WITH DIFFERENTIAL/PLATELET
BASOS ABS: 0 10*3/uL (ref 0–0.1)
Basophils Relative: 1 %
EOS ABS: 0.1 10*3/uL (ref 0–0.7)
EOS PCT: 3 %
HCT: 39.1 % (ref 35.0–47.0)
HEMOGLOBIN: 13.2 g/dL (ref 12.0–16.0)
LYMPHS PCT: 35 %
Lymphs Abs: 1.5 10*3/uL (ref 1.0–3.6)
MCH: 30.1 pg (ref 26.0–34.0)
MCHC: 33.7 g/dL (ref 32.0–36.0)
MCV: 89.4 fL (ref 80.0–100.0)
Monocytes Absolute: 0.4 10*3/uL (ref 0.2–0.9)
Monocytes Relative: 10 %
NEUTROS PCT: 51 %
Neutro Abs: 2.2 10*3/uL (ref 1.4–6.5)
PLATELETS: 191 10*3/uL (ref 150–440)
RBC: 4.38 MIL/uL (ref 3.80–5.20)
RDW: 13.4 % (ref 11.5–14.5)
WBC: 4.3 10*3/uL (ref 3.6–11.0)

## 2016-07-21 LAB — FERRITIN: FERRITIN: 13 ng/mL (ref 11–307)

## 2016-07-21 MED ORDER — CYANOCOBALAMIN 1000 MCG/ML IJ SOLN
1000.0000 ug | Freq: Once | INTRAMUSCULAR | Status: AC
  Administered 2016-07-21: 1000 ug via INTRAMUSCULAR

## 2016-07-21 NOTE — Progress Notes (Signed)
S: pt here for b12 injection  O: vitals wnl, nad, b12 injection given by rma  A: b12 def  P: f/u prn

## 2016-07-23 ENCOUNTER — Other Ambulatory Visit: Payer: Self-pay

## 2016-07-23 ENCOUNTER — Telehealth: Payer: Self-pay | Admitting: Family Medicine

## 2016-07-23 MED ORDER — SERTRALINE HCL 50 MG PO TABS: 50.0000 mg | ORAL_TABLET | Freq: Every day | ORAL | 0 refills | Status: DC

## 2016-07-23 NOTE — Telephone Encounter (Signed)
Medication refilled

## 2016-07-23 NOTE — Telephone Encounter (Signed)
She should finish the course (it is 12 weeks).

## 2016-07-23 NOTE — Telephone Encounter (Signed)
Pt has been trying to get her sertraline (ZOLOFT) 50 MG tablet, which should be a 100 MG refilled since 7/26. Her pharmacy does not have this request. Please advise pt. She is out of her medication.

## 2016-07-23 NOTE — Telephone Encounter (Signed)
Patients Zoloft has been refilled and is wondering if she needs to keep taking the vitamin D?

## 2016-07-23 NOTE — Telephone Encounter (Signed)
Pt called back. She also wants to know if she should continue on her victim D.

## 2016-07-24 ENCOUNTER — Other Ambulatory Visit: Payer: Self-pay | Admitting: *Deleted

## 2016-07-24 ENCOUNTER — Inpatient Hospital Stay (HOSPITAL_BASED_OUTPATIENT_CLINIC_OR_DEPARTMENT_OTHER): Payer: Managed Care, Other (non HMO) | Admitting: Internal Medicine

## 2016-07-24 ENCOUNTER — Inpatient Hospital Stay: Payer: Managed Care, Other (non HMO)

## 2016-07-24 VITALS — BP 135/95 | HR 71 | Temp 97.1°F | Resp 18 | Wt 244.1 lb

## 2016-07-24 DIAGNOSIS — D509 Iron deficiency anemia, unspecified: Secondary | ICD-10-CM

## 2016-07-24 DIAGNOSIS — R5383 Other fatigue: Secondary | ICD-10-CM

## 2016-07-24 DIAGNOSIS — M1990 Unspecified osteoarthritis, unspecified site: Secondary | ICD-10-CM

## 2016-07-24 DIAGNOSIS — E538 Deficiency of other specified B group vitamins: Secondary | ICD-10-CM

## 2016-07-24 DIAGNOSIS — K909 Intestinal malabsorption, unspecified: Secondary | ICD-10-CM | POA: Diagnosis not present

## 2016-07-24 DIAGNOSIS — Z9884 Bariatric surgery status: Secondary | ICD-10-CM | POA: Diagnosis not present

## 2016-07-24 DIAGNOSIS — D508 Other iron deficiency anemias: Secondary | ICD-10-CM | POA: Diagnosis not present

## 2016-07-24 DIAGNOSIS — Z880 Allergy status to penicillin: Secondary | ICD-10-CM

## 2016-07-24 DIAGNOSIS — Z79899 Other long term (current) drug therapy: Secondary | ICD-10-CM

## 2016-07-24 MED ORDER — SODIUM CHLORIDE 0.9 % IV SOLN
200.0000 mg | Freq: Once | INTRAVENOUS | Status: AC
  Administered 2016-07-24: 200 mg via INTRAVENOUS
  Filled 2016-07-24: qty 10

## 2016-07-24 MED ORDER — SODIUM CHLORIDE 0.9 % IV SOLN
Freq: Once | INTRAVENOUS | Status: AC
  Administered 2016-07-24: 15:00:00 via INTRAVENOUS
  Filled 2016-07-24: qty 1000

## 2016-07-24 NOTE — Telephone Encounter (Signed)
Patient has been informed about Vitamin D and the Zoloft

## 2016-07-24 NOTE — Telephone Encounter (Signed)
Patient stated that she spoke to Dr. Adriana Simas about increasing her Zoloft to 100 mg. Current RX is 50mg . Please advise.

## 2016-07-24 NOTE — Progress Notes (Signed)
[facility] OFFICE PROGRESS NOTE  Patient Care Team: Tommie Sams, DO as PCP - General (Family Medicine)  No matching staging information was found for the patient.    No history exists.   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 (labs on 02/26/15 reported hemoglobin 10.4, MCV 77, platelets 258, WBC 6400, 64% neutrophils, 18% lymphocytes, 15% monocytes, 2% eosinophils, 1% basophils, serum iron low at 20, TIBC 428, TIBC very low at 5%)  2. B12 deficiency - diagnosed June 2016.   INTERVAL HISTORY:  [name] 60 y.o.  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 Denies any blood in stools or black stools. No nausea no vomiting.No chest pain or r cough.  REVIEW OF SYSTEMS:  A complete 10 point review of system is done which is negative except mentioned above/history of present illness.   PAST MEDICAL HISTORY :  Past Medical History:  Diagnosis Date  . Arthritis   . Chicken pox   . Depression   . Heartburn   . History of blood transfusion   . History of hemorrhoids   . IDA (iron deficiency anemia)   . Kidney stones   . Morbid obesity (HCC)    s/p Gastric bypass    PAST SURGICAL HISTORY :   Past Surgical History:  Procedure Laterality Date  . ABDOMINAL HYSTERECTOMY  1997  . CERVICAL SPINE SURGERY     Fusion  . CESAREAN SECTION  1987  . CHOLECYSTECTOMY    . GASTRIC BYPASS  2007  . HERNIA REPAIR  2011  . OOPHORECTOMY    . TONSILLECTOMY AND ADENOIDECTOMY    . 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    FAMILY HISTORY :   Family History  Problem Relation Age of Onset  . Arthritis Mother   . Arthritis Father   . Lung cancer Father   . Arthritis  Maternal Grandmother   . Kidney disease Maternal Grandmother   . Depression Maternal Grandmother   . Breast cancer Sister     two different times    SOCIAL HISTORY:   Social History  Substance Use Topics  . Smoking status: Never Smoker  . Smokeless tobacco: Never Used  . Alcohol use 0.0 - 0.6 oz/week    ALLERGIES:  is allergic to cefuroxime axetil; penicillins; and sulfur.  MEDICATIONS:  Current Outpatient Prescriptions  Medication Sig Dispense Refill  . cyanocobalamin (,VITAMIN B-12,) 1000 MCG/ML injection Inject 1 mL (1,000 mcg total) into the muscle once. 1 mL 0  . mupirocin ointment (BACTROBAN) 2 % Apply bid 22 g 0  . sertraline (ZOLOFT) 50 MG tablet Take 1 tablet (50 mg total) by mouth daily. 90 tablet 0  . Vitamin D, Ergocalciferol, (DRISDOL) 50000 units CAPS capsule Take 1 capsule (50,000 Units total) by mouth every 7 (seven) days. 12 capsule 0   No current facility-administered medications for this visit.     PHYSICAL EXAMINATION: ECOG PERFORMANCE STATUS: 0 - Asymptomatic  BP (!) 135/95 (BP Location: Left Arm, Patient Position: Sitting)   Pulse 71   Temp 97.1 F (36.2 C) (Tympanic)   Resp 18   Wt 244 lb 2 oz (110.7 kg)   BMI 45.75 kg/m   [employer]  07/24/16 1438  Weight: 244 lb 2 oz (110.7 kg)    GENERAL: Well-nourished well-developed; Alert, no distress and comfortable.   Alone.  EYES: no pallor or icterus OROPHARYNX: no thrush or ulceration; good dentition  NECK: supple, no masses felt LYMPH:  no palpable lymphadenopathy in the cervical, axillary or inguinal regions LUNGS: clear to auscultation and  No wheeze or crackles HEART/CVS: regular rate & rhythm and no murmurs; No lower extremity edema ABDOMEN:abdomen soft, non-tender and normal bowel sounds Musculoskeletal:no cyanosis of digits and no clubbing  PSYCH: alert & oriented x 3 with fluent speech NEURO: no focal motor/sensory deficits SKIN:  no rashes or significant lesions  LABORATORY DATA:   I have reviewed the data as listed    Component Value Date/Time   NA 137 07/21/2016 1513   NA 141 12/13/2014 2118   K 3.7 07/21/2016 1513   K 3.7 12/13/2014 2118   CL 106 07/21/2016 1513   CL 106 12/13/2014 2118   CO2 25 07/21/2016 1513   CO2 27 12/13/2014 2118   GLUCOSE 150 (H) 07/21/2016 1513   GLUCOSE 104 (H) 12/13/2014 2118   BUN 22 (H) 07/21/2016 1513   BUN 11 12/13/2014 2118   CREATININE 0.94 07/21/2016 1513   CREATININE 1.16 12/13/2014 2118   CALCIUM 8.9 07/21/2016 1513   CALCIUM 8.4 (L) 12/13/2014 2118   PROT 6.6 06/11/2016 0954   PROT 7.4 12/13/2014 2118   ALBUMIN 4.1 06/11/2016 0954   ALBUMIN 3.6 12/13/2014 2118   AST 17 06/11/2016 0954   AST 30 12/13/2014 2118   ALT 12 06/11/2016 0954   ALT 15 12/13/2014 2118   ALKPHOS 76 06/11/2016 0954   ALKPHOS 107 12/13/2014 2118   BILITOT 0.4 06/11/2016 0954   BILITOT 0.3 12/13/2014 2118   GFRNONAA >60 07/21/2016 1513   GFRNONAA 51 (L) 12/13/2014 2118   GFRAA >60 07/21/2016 1513   GFRAA >60 12/13/2014 2118    No results found for: SPEP, UPEP  Lab Results  Component Value Date   WBC 4.3 07/21/2016   NEUTROABS 2.2 07/21/2016   HGB 13.2 07/21/2016   HCT 39.1 07/21/2016   MCV 89.4 07/21/2016   PLT 191 07/21/2016      Chemistry      Component Value Date/Time   NA 137 07/21/2016 1513   NA 141 12/13/2014 2118   K 3.7 07/21/2016 1513   K 3.7 12/13/2014 2118   CL 106 07/21/2016 1513   CL 106 12/13/2014 2118   CO2 25 07/21/2016 1513   CO2 27 12/13/2014 2118   BUN 22 (H) 07/21/2016 1513   BUN 11 12/13/2014 2118   CREATININE 0.94 07/21/2016 1513   CREATININE 1.16 12/13/2014 2118      Component Value Date/Time   CALCIUM 8.9 07/21/2016 1513   CALCIUM 8.4 (L) 12/13/2014 2118   ALKPHOS 76 06/11/2016 0954   ALKPHOS 107 12/13/2014 2118   AST 17 06/11/2016 0954   AST 30 12/13/2014 2118   ALT 12 06/11/2016 0954   ALT 15 12/13/2014 2118   BILITOT 0.4 06/11/2016 0954   BILITOT 0.3 12/13/2014 2118        RADIOGRAPHIC STUDIES: I have personally reviewed the radiological images as listed and agreed with the findings in the report. No results found.   ASSESSMENT & PLAN:  Other iron deficiency anemias Iron deficiency anemia/B12 deficiency secondary to gastric bypass/malabsorption.   # Ferritin- 13/ hb-13- plan IV Venofer- no previous reactions. Ferritin is pending down. Recommend IV iron.  # trial of  sublingual b12. Currently getting b12 IM thru employee health.   # labs/ possible IV venofer in 4 months & 8 months.  MD in 8 months/ labs few days prior   #   No orders of the defined types were placed in this encounter.  All questions were answered. The patient knows to call the clinic with any problems, questions or concerns.      Earna Coder, MD 07/25/2016 11:28 AM

## 2016-07-24 NOTE — Assessment & Plan Note (Addendum)
Iron deficiency anemia/B12 deficiency secondary to gastric bypass/malabsorption.   # Ferritin- 13/ hb-13- plan IV Venofer- no previous reactions. Ferritin is pending down. Recommend IV iron.  # trial of sublingual b12. Currently getting b12 IM thru employee health.   # labs/ possible IV venofer in 4 months & 8 months.  MD in 8 months/ labs few days prior   #

## 2016-07-27 ENCOUNTER — Other Ambulatory Visit: Payer: Self-pay | Admitting: Family Medicine

## 2016-07-27 MED ORDER — SERTRALINE HCL 100 MG PO TABS: 100.0000 mg | ORAL_TABLET | Freq: Every day | ORAL | 1 refills | Status: DC

## 2016-07-27 NOTE — Telephone Encounter (Signed)
Rx increased and sent

## 2016-09-02 ENCOUNTER — Other Ambulatory Visit: Payer: Self-pay | Admitting: Family Medicine

## 2016-09-02 ENCOUNTER — Telehealth: Payer: Self-pay | Admitting: Family Medicine

## 2016-09-02 NOTE — Telephone Encounter (Signed)
Pt was given information. And scheduled

## 2016-09-02 NOTE — Telephone Encounter (Signed)
Needs recheck prior to refill.

## 2016-09-02 NOTE — Telephone Encounter (Signed)
Refilled 06/15/16. Pt last seen 06/11/16. Last vit D labs 10.43. Please advise.

## 2016-09-02 NOTE — Telephone Encounter (Signed)
Called pt to let her know that to get her Vit D refilled she will need to get repeat labs. Medication is begin refused until labs come back.

## 2016-09-03 ENCOUNTER — Encounter: Payer: Self-pay | Admitting: Family Medicine

## 2016-09-04 ENCOUNTER — Other Ambulatory Visit: Payer: Self-pay | Admitting: Family Medicine

## 2016-09-04 DIAGNOSIS — E559 Vitamin D deficiency, unspecified: Secondary | ICD-10-CM

## 2016-09-07 ENCOUNTER — Other Ambulatory Visit: Payer: Self-pay

## 2016-09-29 ENCOUNTER — Ambulatory Visit: Payer: Self-pay | Admitting: Physician Assistant

## 2016-09-29 ENCOUNTER — Encounter: Payer: Self-pay | Admitting: Physician Assistant

## 2016-09-29 VITALS — BP 119/75 | HR 75 | Temp 98.5°F

## 2016-09-29 DIAGNOSIS — Z299 Encounter for prophylactic measures, unspecified: Secondary | ICD-10-CM

## 2016-09-29 DIAGNOSIS — J0100 Acute maxillary sinusitis, unspecified: Secondary | ICD-10-CM

## 2016-09-29 MED ORDER — FLUTICASONE PROPIONATE 50 MCG/ACT NA SUSP: 2.0000 | Freq: Every day | NASAL | 6 refills | Status: DC

## 2016-09-29 MED ORDER — AZITHROMYCIN 250 MG PO TABS: ORAL_TABLET | ORAL | 0 refills | Status: DC

## 2016-09-29 NOTE — Progress Notes (Signed)
S: C/o runny nose and congestion for several  days, no fever, chills, cp/sob, v/d; mucus is brown and thick, cough is sporadic, c/o of facial and dental pain. Also throat is really sore and voice has started to get hoarse, fells really tired, states she switched from b12 injection to sublingual b12  Using otc meds:   O: PE: perrl eomi, normocephalic, tms dull, nasal mucosa red and swollen, throat injected, neck supple no lymph, lungs c t a, cv rrr, neuro intact  A:  Acute sinusitis   P: drink fluids, continue regular meds , use otc meds of choice, return if not improving in 5 days, return earlier if worsening  Zpack, flonase

## 2016-09-30 LAB — CBC WITH DIFFERENTIAL/PLATELET
BASOS: 1 %
Basophils Absolute: 0 10*3/uL (ref 0.0–0.2)
EOS (ABSOLUTE): 0.1 10*3/uL (ref 0.0–0.4)
EOS: 2 %
HEMATOCRIT: 41.3 % (ref 34.0–46.6)
HEMOGLOBIN: 13.6 g/dL (ref 11.1–15.9)
IMMATURE GRANS (ABS): 0 10*3/uL (ref 0.0–0.1)
IMMATURE GRANULOCYTES: 0 %
LYMPHS: 36 %
Lymphocytes Absolute: 1.8 10*3/uL (ref 0.7–3.1)
MCH: 29.8 pg (ref 26.6–33.0)
MCHC: 32.9 g/dL (ref 31.5–35.7)
MCV: 91 fL (ref 79–97)
MONOCYTES: 11 %
Monocytes Absolute: 0.6 10*3/uL (ref 0.1–0.9)
NEUTROS ABS: 2.6 10*3/uL (ref 1.4–7.0)
NEUTROS PCT: 50 %
PLATELETS: 255 10*3/uL (ref 150–379)
RBC: 4.56 x10E6/uL (ref 3.77–5.28)
RDW: 14.4 % (ref 12.3–15.4)
WBC: 5.1 10*3/uL (ref 3.4–10.8)

## 2016-09-30 LAB — VITAMIN B12

## 2016-09-30 LAB — IRON AND TIBC
IRON SATURATION: 15 % (ref 15–55)
IRON: 46 ug/dL (ref 27–159)
Total Iron Binding Capacity: 316 ug/dL (ref 250–450)
UIBC: 270 ug/dL (ref 131–425)

## 2016-09-30 LAB — FERRITIN: FERRITIN: 50 ng/mL (ref 15–150)

## 2016-09-30 LAB — VITAMIN D 25 HYDROXY (VIT D DEFICIENCY, FRACTURES): VIT D 25 HYDROXY: 24.7 ng/mL — AB (ref 30.0–100.0)

## 2016-10-01 ENCOUNTER — Ambulatory Visit
Admit: 2016-10-01 | Discharge: 2016-10-01 | Payer: BLUE CROSS/BLUE SHIELD | Attending: Family Medicine | Primary: Family Medicine

## 2016-10-01 DIAGNOSIS — J0100 Acute maxillary sinusitis, unspecified: Secondary | ICD-10-CM

## 2016-10-01 MED ORDER — METHYLPREDNISOLONE (PF) 40 MG/ML IJ SOLR
40 mg/mL | Freq: Once | INTRAMUSCULAR | Status: AC
Start: 2016-10-01 — End: 2016-10-01
  Administered 2016-10-01: 18:00:00 via INTRAMUSCULAR

## 2016-10-01 MED ORDER — CEFDINIR 300 MG CAP
300 mg | ORAL_CAPSULE | Freq: Two times a day (BID) | ORAL | 0 refills | Status: DC
Start: 2016-10-01 — End: 2016-10-14

## 2016-10-01 NOTE — Progress Notes (Signed)
Katelyn [name] is a 60 y.o. female who presents with   Chief Complaint   Patient presents with   ??? Facial Swelling     left, recently started wearing contacts - has not been able to wear much d/t sxs, redness and swelling under eye, having severe allergy sxs, facial pressure and pain, off-balance       History of Present Illness    Eye pain (left) and fatigue x 2-3 week.  L sinus pressure.  Upper teeth hurting.  Conjestion.  Using eye drops for allergic conjunctivitis.  Started contacts 2 months ago and has struggled with removal.    Review of Systems  Review of Systems   Constitutional: Negative for chills, fever and malaise/fatigue.   HENT: Positive for congestion and sinus pain. Negative for hearing loss.    Eyes: Positive for pain. Negative for blurred vision and discharge.   Respiratory: Negative for cough, hemoptysis, shortness of breath and wheezing.    Cardiovascular: Negative for chest pain, palpitations and leg swelling.   Gastrointestinal: Negative for abdominal pain, blood in stool, constipation, diarrhea, heartburn and nausea.   Genitourinary: Negative for dysuria, frequency, hematuria and urgency.   Musculoskeletal: Negative for joint pain and myalgias.   Skin: Negative for rash.   Neurological: Negative for dizziness, sensory change and headaches.   Endo/Heme/Allergies: Negative for polydipsia. Does not bruise/bleed easily.   Psychiatric/Behavioral: Negative for depression. The patient is not nervous/anxious and does not have insomnia.        Medications  Current Outpatient Prescriptions   Medication Sig   ??? cefdinir (OMNICEF) 300 mg capsule Take 1 Cap by mouth two (2) times a day.   ??? simvastatin (ZOCOR) 20 mg tablet Take 20 mg by mouth nightly.   ??? multivitamin (ONE A DAY) tablet Take 1 Tab by mouth daily.   ??? TURMERIC ROOT EXTRACT PO Take  by mouth daily.   ??? amLODIPine (NORVASC) 10 mg tablet Take 10 mg by mouth nightly.   ??? ferrous sulfate (IRON) 325 mg (65 mg iron) tablet Take  by mouth Daily  (before breakfast).   ??? losartan-hydrochlorothiazide (HYZAAR) 100-25 mg per tablet Take 1 Tab by mouth daily.   ??? fluticasone (FLONASE) 50 mcg/actuation nasal spray 2 Sprays by Both Nostrils route nightly.   ??? aspirin delayed-release 81 mg tablet Take  by mouth nightly. Indications: prevention of transient ischemic attack   ??? calcium-cholecalciferol, d3, (CALCIUM 600 + D) 600-125 mg-unit tab Take  by mouth three (3) times daily.   ??? cetirizine (ZYRTEC) 10 mg tablet Take  by mouth.     Current Facility-Administered Medications   Medication   ??? methylPREDNISolone (PF) (SOLU-MEDROL) injection 40 mg       Past Medical History  Past Medical History:   Diagnosis Date   ??? Cancer ([place])     melanoma - back   ??? Hypercholesterolemia    ??? Hypertension     managed with meds   ??? Nausea & vomiting    ??? Osteopenia        Surgical History  Past Surgical History:   Procedure Laterality Date   ??? HX COLONOSCOPY     ??? HX GYN      c-section x 5   ??? HX ORTHOPAEDIC Left 04/30/2016    knee   ??? HX OTHER SURGICAL      melanoma removed from back          Family History  Family History   Problem Relation Age  of Onset   ??? Hypertension Mother    ??? Elevated Lipids Mother    ??? Elevated Lipids Father    ??? Hypertension Father    ??? Arthritis-osteo Father      spine   ??? Heart Disease Father    ??? Breast Cancer Other    ??? Diabetes Sister        Social History  Social History     Social History   ??? Marital status: MARRIED     Spouse name: N/A   ??? Number of children: N/A   ??? Years of education: N/A     Occupational History   ??? Not on file.     Social History Main Topics   ??? Smoking status: Never Smoker   ??? Smokeless tobacco: Never Used   ??? Alcohol use No   ??? Drug use: Not on file   ??? Sexual activity: Not on file     Other Topics Concern   ??? Not on file     Social History Narrative       History   Smoking Status   ??? Never Smoker   Smokeless Tobacco   ??? Never Used       Allergies  Allergies   Allergen Reactions   ??? Adhesive Tape-Silicones Rash        Vital Signs  Body mass index is 22.62 kg/(m^2).  Vitals:    10/01/16 1337   BP: 138/82   Pulse: 79   SpO2: 97%   Weight: 144 lb 6.4 oz (65.5 kg)       Physical Exam  Physical Exam   Constitutional: She is well-developed, well-nourished, and in no distress.   HENT:   Right Ear: Tympanic membrane normal.   Left Ear: Tympanic membrane normal.   Nose: No rhinorrhea. Right sinus exhibits no maxillary sinus tenderness. Left sinus exhibits maxillary sinus tenderness.   Mouth/Throat: Uvula is midline. Posterior oropharyngeal erythema present. No oropharyngeal exudate.   Eyes: Right eye exhibits no discharge. Left eye exhibits no discharge.   Conjunctiva injected bilat; no drainage   Neck: No thyromegaly present.   Cardiovascular: Normal rate and regular rhythm.    No murmur heard.  Pulmonary/Chest: Effort normal and breath sounds normal. No respiratory distress. She has no wheezes. She has no rales. She exhibits no tenderness.   Lymphadenopathy:     She has no cervical adenopathy.   Skin: Skin is warm and dry. No rash noted.       Assessment and Plan  Diagnoses and all orders for this visit:    1. Acute non-recurrent maxillary sinusitis  -     cefdinir (OMNICEF) 300 mg capsule; Take 1 Cap by mouth two (2) times a day.  -     methylPREDNISolone (PF) (SOLU-MEDROL) injection 40 mg; 1 mL by IntraMUSCular route once.    2. Chronic allergic rhinitis, unspecified seasonality, unspecified trigger  -     methylPREDNISolone (PF) (SOLU-MEDROL) injection 40 mg; 1 mL by IntraMUSCular route once.    call if not improved

## 2016-10-02 ENCOUNTER — Telehealth: Payer: Self-pay | Admitting: *Deleted

## 2016-10-02 NOTE — Telephone Encounter (Signed)
FYI patient has never taken the b12 injection,however she takes 1000 mg of B12 tablet form nightly, advised by her Dr  B at the  [facility] .  Pt contact [phone] until 1630

## 2016-10-02 NOTE — Telephone Encounter (Signed)
Pt stated that she took the B12 tablet the night before labs and was told to talk to Dr.B about stopping her B12 tablets.

## 2016-10-14 ENCOUNTER — Ambulatory Visit
Admit: 2016-10-14 | Discharge: 2016-10-14 | Payer: BLUE CROSS/BLUE SHIELD | Attending: Family Medicine | Primary: Family Medicine

## 2016-10-14 DIAGNOSIS — R1010 Upper abdominal pain, unspecified: Secondary | ICD-10-CM

## 2016-10-14 MED ORDER — AMLODIPINE 10 MG TAB
10 mg | ORAL_TABLET | Freq: Every evening | ORAL | 3 refills | Status: AC
Start: 2016-10-14 — End: ?

## 2016-10-14 MED ORDER — SIMVASTATIN 20 MG TAB
20 mg | ORAL_TABLET | Freq: Every evening | ORAL | 3 refills | Status: DC
Start: 2016-10-14 — End: 2017-04-20

## 2016-10-14 MED ORDER — LOSARTAN-HYDROCHLOROTHIAZIDE 100 MG-25 MG TAB
100-25 mg | ORAL_TABLET | Freq: Every day | ORAL | 3 refills | Status: DC
Start: 2016-10-14 — End: 2017-01-12

## 2016-10-14 NOTE — Progress Notes (Signed)
Katelyn [name] is a 60 y.o. female who presents with   Chief Complaint   Patient presents with   ??? Cholesterol Problem     fasting for labs   ??? Hypertension     does not check BP at home, denies headaches or dizziness   ??? Shoulder Pain     right side, started approx 1 month ago, improved but still hurts under arm and upper arm, feels like nerve issues   ??? Eye Pain     saw eye doctor yesterday for ongoing issues, has started on steroid gtts, believes could be r/t increased use of allergy meds causing dry eyes       History of Present Illness    BP has been doing well at home, but does not check frequently.  No CP or SOB.  No headaches or edema.  No side effects with medications.  Exercising regularly.No myalgia on statin.     R scapular pain x 1 mo.  No injury.  Radiates to R arm. Skin sensitivity.  No rash.  No neck pain.  Upper abd pain off and on.  Worse with deep breath.  Hx of melanoma. Good rom shoulder joint.  Dry eyes and allergies.    Review of Systems  Review of Systems   Constitutional: Negative for chills, fever and malaise/fatigue.   HENT: Negative for congestion and hearing loss.    Eyes: Negative for blurred vision, pain and discharge.   Respiratory: Negative for cough, hemoptysis, shortness of breath and wheezing.    Cardiovascular: Negative for palpitations and leg swelling.   Gastrointestinal: Positive for abdominal pain. Negative for blood in stool, constipation, diarrhea, heartburn and nausea.   Genitourinary: Negative for dysuria, frequency, hematuria and urgency.   Musculoskeletal: Negative for joint pain and myalgias.   Skin: Negative for rash.   Neurological: Negative for dizziness, sensory change and headaches.   Endo/Heme/Allergies: Negative for polydipsia. Does not bruise/bleed easily.   Psychiatric/Behavioral: Negative for depression. The patient is not nervous/anxious and does not have insomnia.        Medications  Current Outpatient Prescriptions   Medication Sig    ??? simvastatin (ZOCOR) 20 mg tablet Take 1 Tab by mouth nightly.   ??? amLODIPine (NORVASC) 10 mg tablet Take 1 Tab by mouth nightly.   ??? losartan-hydroCHLOROthiazide (HYZAAR) 100-25 mg per tablet Take 1 Tab by mouth daily.   ??? multivitamin (ONE A DAY) tablet Take 1 Tab by mouth daily.   ??? TURMERIC ROOT EXTRACT PO Take  by mouth daily.   ??? ferrous sulfate (IRON) 325 mg (65 mg iron) tablet Take  by mouth Daily (before breakfast).   ??? fluticasone (FLONASE) 50 mcg/actuation nasal spray 2 Sprays by Both Nostrils route nightly.   ??? aspirin delayed-release 81 mg tablet Take  by mouth nightly. Indications: prevention of transient ischemic attack   ??? calcium-cholecalciferol, d3, (CALCIUM 600 + D) 600-125 mg-unit tab Take  by mouth three (3) times daily.   ??? cetirizine (ZYRTEC) 10 mg tablet Take  by mouth.     No current facility-administered medications for this visit.        Past Medical History  Past Medical History:   Diagnosis Date   ??? Cancer ([place])     melanoma - back   ??? Hypercholesterolemia    ??? Hypertension     managed with meds   ??? Nausea & vomiting    ??? Osteopenia        Surgical History  Past Surgical History:   Procedure Laterality Date   ??? HX COLONOSCOPY     ??? HX GYN      c-section x 5   ??? HX ORTHOPAEDIC Left 04/30/2016    knee   ??? HX OTHER SURGICAL      melanoma removed from back          Family History  Family History   Problem Relation Age of Onset   ??? Hypertension Mother    ??? Elevated Lipids Mother    ??? Elevated Lipids Father    ??? Hypertension Father    ??? Arthritis-osteo Father      spine   ??? Heart Disease Father    ??? Breast Cancer Other    ??? Diabetes Sister        Social History  Social History     Social History   ??? Marital status: MARRIED     Spouse name: N/A   ??? Number of children: N/A   ??? Years of education: N/A     Occupational History   ??? Not on file.     Social History Main Topics   ??? Smoking status: Never Smoker   ??? Smokeless tobacco: Never Used   ??? Alcohol use No   ??? Drug use: Not on file    ??? Sexual activity: Not on file     Other Topics Concern   ??? Not on file     Social History Narrative       History   Smoking Status   ??? Never Smoker   Smokeless Tobacco   ??? Never Used       Allergies  Allergies   Allergen Reactions   ??? Adhesive Tape-Silicones Rash       Vital Signs  Body mass index is 22.27 kg/(m^2).  Vitals:    10/14/16 0851   BP: 122/74   Pulse: 67   SpO2: 97%   Weight: 142 lb 3.2 oz (64.5 kg)       Physical Exam  Physical Exam   Constitutional: She is well-developed, well-nourished, and in no distress.   HENT:   Right Ear: Tympanic membrane normal.   Left Ear: Tympanic membrane normal.   Mouth/Throat: No oropharyngeal exudate.   Neck: No thyromegaly present.   Cardiovascular: Normal rate, regular rhythm, normal heart sounds and normal pulses.  Exam reveals no gallop and no friction rub.    No murmur heard.  Pulmonary/Chest: Breath sounds normal. No respiratory distress. She has no wheezes. She has no rales. She exhibits tenderness (R scapula and upper arm; sensitivity superficially).   Abdominal: Soft. Bowel sounds are normal. She exhibits no distension and no mass. There is tenderness (mild; upper). There is no rebound and no guarding.   Musculoskeletal: She exhibits tenderness (R scapular sensitivity). She exhibits no edema.   Lymphadenopathy:     She has no cervical adenopathy.   Skin: Skin is warm and dry.   Psychiatric: Mood and affect normal.       Assessment and Plan  Diagnoses and all orders for this visit:    1. Pain of upper abdomen  -     US- Abdominal; Future    2. Essential hypertension  -     amLODIPine (NORVASC) 10 mg tablet; Take 1 Tab by mouth nightly.  -     losartan-hydroCHLOROthiazide (HYZAAR) 100-25 mg per tablet; Take 1 Tab by mouth daily.    3. Hyperlipidemia, unspecified hyperlipidemia type  -  simvastatin (ZOCOR) 20 mg tablet; Take 1 Tab by mouth nightly.  -     Lipid Panel with LDL/HDL  -     CMP  -     Venipuncture    4. 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

## 2016-10-15 ENCOUNTER — Ambulatory Visit

## 2016-10-15 ENCOUNTER — Encounter

## 2016-10-15 ENCOUNTER — Inpatient Hospital Stay: Admit: 2016-10-15 | Payer: BLUE CROSS/BLUE SHIELD | Attending: Family Medicine | Primary: Family Medicine

## 2016-10-15 DIAGNOSIS — R1010 Upper abdominal pain, unspecified: Secondary | ICD-10-CM

## 2016-10-15 DIAGNOSIS — K769 Liver disease, unspecified: Secondary | ICD-10-CM

## 2016-10-15 LAB — METABOLIC PANEL, COMPREHENSIVE
A-G Ratio: 1.5 (ref 1.2–2.2)
ALT (SGPT): 20 IU/L (ref 0–32)
AST (SGOT): 28 IU/L (ref 0–40)
Albumin: 4.3 g/dL (ref 3.6–4.8)
Alk. phosphatase: 91 IU/L (ref 39–117)
BUN/Creatinine ratio: 15 (ref 12–28)
BUN: 14 mg/dL (ref 8–27)
Bilirubin, total: 0.3 mg/dL (ref 0.0–1.2)
CO2: 28 mmol/L (ref 18–29)
Calcium: 9.3 mg/dL (ref 8.7–10.3)
Chloride: 100 mmol/L (ref 96–106)
Creatinine: 0.93 mg/dL (ref 0.57–1.00)
GFR est AA: 77 mL/min/{1.73_m2} (ref >59)
GFR est non-AA: 67 mL/min/{1.73_m2} (ref >59)
GLOBULIN, TOTAL: 2.9 g/dL (ref 1.5–4.5)
Glucose: 92 mg/dL (ref 65–99)
Potassium: 3.7 mmol/L (ref 3.5–5.2)
Protein, total: 7.2 g/dL (ref 6.0–8.5)
Sodium: 144 mmol/L (ref 134–144)

## 2016-10-15 LAB — LIPID PANEL WITH LDL/HDL RATIO
Cholesterol, total: 162 mg/dL (ref 100–199)
HDL Cholesterol: 63 mg/dL (ref >39)
LDL, calculated: 91 mg/dL (ref 0–99)
LDL/HDL Ratio: 1.4 ratio units (ref 0.0–3.2)
Triglyceride: 42 mg/dL (ref 0–149)
VLDL, calculated: 8 mg/dL (ref 5–40)

## 2016-10-15 MED ORDER — IOPAMIDOL 76 % IV SOLN
370 mg iodine /mL (76 %) | Freq: Once | INTRAVENOUS | Status: AC
Start: 2016-10-15 — End: 2016-10-15
  Administered 2016-10-15: 19:00:00 via INTRAVENOUS

## 2016-10-15 MED ORDER — SALINE PERIPHERAL FLUSH PRN
Freq: Once | INTRAMUSCULAR | Status: AC
Start: 2016-10-15 — End: 2016-10-15
  Administered 2016-10-15: 19:00:00

## 2016-10-15 MED ORDER — SODIUM CHLORIDE 0.9% BOLUS IV
0.9 % | Freq: Once | INTRAVENOUS | Status: AC
Start: 2016-10-15 — End: 2016-10-15
  Administered 2016-10-15: 19:00:00 via INTRAVENOUS

## 2016-10-15 MED ORDER — DIATRIZOATE MEGLUMINE & SODIUM 66 %-10 % ORAL SOLN
66-10 % | Freq: Once | ORAL | Status: AC
Start: 2016-10-15 — End: 2016-10-15
  Administered 2016-10-15: 19:00:00 via ORAL

## 2016-10-16 ENCOUNTER — Inpatient Hospital Stay: Admit: 2016-10-16 | Payer: BLUE CROSS/BLUE SHIELD | Primary: Family Medicine

## 2016-10-16 ENCOUNTER — Ambulatory Visit
Admit: 2016-10-16 | Discharge: 2016-10-16 | Payer: BLUE CROSS/BLUE SHIELD | Attending: Internal Medicine | Primary: Family Medicine

## 2016-10-16 DIAGNOSIS — K769 Liver disease, unspecified: Secondary | ICD-10-CM

## 2016-10-16 LAB — CBC WITH AUTOMATED DIFF
ABS. BASOPHILS: 0 10*3/uL (ref 0.0–0.2)
ABS. EOSINOPHILS: 0.1 10*3/uL (ref 0.0–0.8)
ABS. LYMPHOCYTES: 1.4 10*3/uL (ref 0.5–4.6)
ABS. MONOCYTES: 0.4 10*3/uL (ref 0.1–1.3)
ABS. NEUTROPHILS: 3.4 10*3/uL (ref 1.7–8.2)
ABSOLUTE NRBC: 0 10*3/uL (ref 0.0–0.2)
BASOPHILS: 1 % (ref 0.0–2.0)
EOSINOPHILS: 1 % (ref 0.5–7.8)
HCT: 46.9 % — ABNORMAL HIGH (ref 35.8–46.3)
HGB: 16.3 g/dL — ABNORMAL HIGH (ref 11.7–15.4)
LYMPHOCYTES: 27 % (ref 13–44)
MCH: 29.7 PG (ref 26.1–32.9)
MCHC: 34.8 g/dL (ref 31.4–35.0)
MCV: 85.6 FL (ref 79.6–97.8)
MONOCYTES: 8 % (ref 4.0–12.0)
MPV: 8.7 FL — ABNORMAL LOW (ref 10.8–14.1)
NEUTROPHILS: 64 % (ref 43–78)
PLATELET: 387 10*3/uL (ref 150–450)
RBC: 5.48 M/uL — ABNORMAL HIGH (ref 4.05–5.25)
RDW: 13.6 % (ref 11.9–14.6)
WBC: 5.3 10*3/uL (ref 4.3–11.1)

## 2016-10-16 LAB — CEA: CEA: 1 ng/mL (ref 0.0–3.0)

## 2016-10-16 LAB — PTT: aPTT: 26.4 s (ref 23.5–31.7)

## 2016-10-16 LAB — CANCER AG 19-9: Cancer antigen 19-9: 23.6 U/mL (ref 2.0–37.0)

## 2016-10-16 LAB — LD: LD: 441 U/L — ABNORMAL HIGH (ref 100–190)

## 2016-10-16 LAB — CANCER ANTIGEN (CA) 15-3: Cancer antigen 15-3: 7.2 U/mL (ref 1.0–35.0)

## 2016-10-16 LAB — PROTHROMBIN TIME + INR
INR: 1 (ref 0.9–1.2)
Prothrombin time: 10.4 s (ref 9.6–12.0)

## 2016-10-16 LAB — AMB POC TUBERCULOSIS, INTRADERMAL (SKIN TEST)

## 2016-10-16 NOTE — Patient Instructions (Addendum)
Patient Instructions From Your Nurse    Reason for Visit:  New patient for liver lesions.    Plan:  We will draw some lab work today.  We also need to biopsy your liver.    Follow Up:  We will be in touch.    Recent Lab Results:  Recent Results (from the past 12 hour(s))   AMB POC TUBERCULOSIS, INTRADERMAL (SKIN TEST)    Collection Time: 10/16/16 10:40 AM   Result Value Ref Range    PPD  Negative    mm Induration 0JLemonde mm         Care plan has been given to patient: n/a.        -------------------------------------------------------------------------------------------------------------------  Please call our office at [phone] if you have any  of the following symptoms:   ?? Fever of 100.5 or greater  ?? Chills  ?? Shortness of breath  ?? Swelling or pain in one leg    After office hours an answering service is available and will contact a provider for emergencies or if you are experiencing any of the above symptoms.    ? Patient did express an interest in My Chart.  My Chart log in information explained on the after visit summary printout at the [place] desk.    Vania Rea, RN

## 2016-10-16 NOTE — Progress Notes (Signed)
Results mailed 10/16/16  pl

## 2016-10-16 NOTE — Progress Notes (Signed)
New Patient Abstract    Reason for Referral: Liver lesion; Pulmonary lesion    Referring Provider:  Dianna Rossetti, MD    Primary Care Provider:  Dianna Rossetti, MD    Family History of Cancer/Hematologic disorders:  None reported    Presenting Symptoms: One month history of intermittent upper abdominal pain, loss of appetite, and right scapular pain radiating to right arm    Narrative with recent with Results/Procedures/Biopsies and Dates completed:  Katelyn [name] is a 60 year old white female with a history of osteopenia, hypertension, hypercholesterolemia, melanoma to back s/p excision, cesarean section x 5 and knee surgery on 04/30/16. She initially presented to her PCP on 10/14/16 reporting a one month history of intermittent upper abdominal pain with loss of appetite and right scapular pain radiating to her right arm. Ultrasound of the abdomen, obtained on 10/15/16, identified multiple lesions in the liver, concerning for metastatic disease and small hypoechoic areas near the tail of the pancreas, possible adenopathy.     [facility] imaging with CT scan of the chest was also completed on 10/15/16 demonstrating gallstones, small left renal cysts, and hepatic and pulmonary metastatic lesions. Multiple noncalcified bilateral lung nodules with the largest in the left upper lobe measuring 12 mm were appreciated as well as multiple low-density lesions throughout the hepatic parenchyma with the largest in the left lobe measuring 20 mm x 25 mm.     Katelyn [name] now presents to [facility], per referral from her PCP, for oncological evaluation and discussion of treatment options for newly discovered hepatic and pulmonary metastatic lesions.     ABDOMINAL ULTRASOUND 10/15/2016  IMPRESSION:   1.  Multiple lesions in the liver, concerning for metastatic disease.  CT of the chest, abdomen, and pelvis is recommended for further evaluation.  2.  Small hypoechoic areas near the tail the pancreas, possible adenopathy.     CT SCAN OF THE CHEST ABDOMEN AND PELVIS WITH CONTRAST 10/15/2016   IMPRESSION: Gallstones, small left renal cysts, hepatic and pulmonary metastatic lesions    Notes from referring Provider:  Evaluation of lesions to liver and lungs on [place] and CT 10-15-16.    Other pertinent information:  None    Presented at Tumor Board: No

## 2016-10-16 NOTE — Progress Notes (Signed)
[facility]: Office Visit New Patient H & P    Chief Complaint:    Chief Complaint   Patient presents with   ??? New Patient         History of Present Illness:  Katelyn [name] is a 60 y.o. female who presents today for evaluation regarding newly diagnosed liver and lung lesions.  She has a history of osteopenia, hypertension, hypercholesterolemia, and melanoma of the back s/p excision in 2008, sentinel node was negative but she is unsure what T stage.  She initially presented to her PCP on 10/14/16 reporting a one month history of intermittent upper abdominal pain with loss of appetite and right scapular pain radiating to her right arm.  Ultrasound of the abdomen, obtained on 10/15/16, identified multiple lesions in the liver, concerning for metastatic disease, as well as small hypoechoic areas near the tail of the pancreas, possibly adenopathy.  Further imaging was performed with CT scan of the chest, this showed hepatic and pulmonary lesions highly concerning for metastatic disease.  Lung nodules measured up to 12 mm and liver lesions measured up to 25 mm.  She is referred for urgent oncologic consultation for evaluation and management options.  Main complaints are the intermittent abdominal and scapular pain, as well as unexplained weight loss, anorexia, and fatigue.  She is not nauseated but food is just unappealing to her, she is only able to eat about half of a normal meal for her.         Review of Systems:  Constitutional: Positive for malaise/fatigue.   HENT: Negative.   Eyes: Negative.   Respiratory: Negative.   Cardiovascular: Negative.   Gastrointestinal: Positive for anorexia and weight loss.   Genitourinary: Negative.   Musculoskeletal: Negative.   Skin: Negative.   Neurological: Negative.   Endo/Heme/Allergies: Negative.   Psychiatric/Behavioral: Negative.   All other systems reviewed and are negative.     Allergies   Allergen Reactions   ??? Adhesive Tape-Silicones Rash      Past Medical History:   Diagnosis Date   ??? Cancer ([place])     melanoma - back   ??? Hypercholesterolemia    ??? Hypertension     managed with meds   ??? Nausea & vomiting    ??? Osteopenia      Past Surgical History:   Procedure Laterality Date   ??? HX COLONOSCOPY     ??? HX GYN      c-section x 5   ??? HX ORTHOPAEDIC Left 04/30/2016    knee   ??? HX OTHER SURGICAL      melanoma removed from back     Family History   Problem Relation Age of Onset   ??? Hypertension Mother    ??? Elevated Lipids Mother    ??? Elevated Lipids Father    ??? Hypertension Father    ??? Arthritis-osteo Father      spine   ??? Heart Disease Father    ??? Breast Cancer Other    ??? Diabetes Sister      Social History     Social History   ??? Marital status: MARRIED     Spouse name: N/A   ??? Number of children: N/A   ??? Years of education: N/A     Occupational History   ??? Not on file.     Social History Main Topics   ??? Smoking status: Never Smoker   ??? Smokeless tobacco: Never Used   ??? Alcohol use  No   ??? Drug use: Not on file   ??? Sexual activity: Not on file     Other Topics Concern   ??? Not on file     Social History Narrative     Current Outpatient Prescriptions   Medication Sig Dispense Refill   ??? simvastatin (ZOCOR) 20 mg tablet Take 1 Tab by mouth nightly. 90 Tab 3   ??? amLODIPine (NORVASC) 10 mg tablet Take 1 Tab by mouth nightly. 90 Tab 3   ??? losartan-hydroCHLOROthiazide (HYZAAR) 100-25 mg per tablet Take 1 Tab by mouth daily. 90 Tab 3   ??? multivitamin (ONE A DAY) tablet Take 1 Tab by mouth daily.     ??? TURMERIC ROOT EXTRACT PO Take  by mouth daily.     ??? ferrous sulfate (IRON) 325 mg (65 mg iron) tablet Take  by mouth Daily (before breakfast).     ??? fluticasone (FLONASE) 50 mcg/actuation nasal spray 2 Sprays by Both Nostrils route nightly.     ??? aspirin delayed-release 81 mg tablet Take  by mouth nightly. Indications: prevention of transient ischemic attack     ??? calcium-cholecalciferol, d3, (CALCIUM 600 + D) 600-125 mg-unit tab Take  by mouth three (3) times daily.      ??? cetirizine (ZYRTEC) 10 mg tablet Take  by mouth.         OBJECTIVE:  Visit Vitals   ??? BP (!) 126/97  Comment: standing   ??? Pulse (!) 115   ??? Temp 98.5 ??F (36.9 ??C) (Oral)   ??? Resp 21   ??? Ht '5\' 7"'  (1.702 m)   ??? Wt 141 lb 9.6 oz (64.2 kg)   ??? SpO2 97%   ??? BMI 22.18 kg/m2       Physical Exam:  Constitutional: Well developed, well nourished female in no acute distress, sitting comfortably on the examination table.    HEENT: Normocephalic and atraumatic. Oropharynx is clear, mucous membranes are moist.  Sclerae anicteric. Neck supple without JVD. No thyromegaly present.    Lymph node   No palpable submandibular, cervical, supraclavicular, axillary lymph nodes.   Skin Warm and dry.  No bruising and no rash noted.  No erythema.  No pallor.    Respiratory Lungs are clear to auscultation bilaterally without wheezes, rales or rhonchi, normal air exchange without accessory muscle use.    CVS Normal rate, regular rhythm and normal S1 and S2.  No murmurs, gallops, or rubs.   Abdomen Soft, nontender and nondistended, normoactive bowel sounds.  No palpable mass.  No hepatosplenomegaly.   Neuro Grossly nonfocal with no obvious sensory or motor deficits.   MSK Normal range of motion in general.  No edema and no tenderness.   Psych Appropriate mood and affect.      Labs:  Recent Results (from the past 336 hour(s))   LIPID PANEL WITH LDL/HDL RATIO    Collection Time: 10/14/16  9:48 AM   Result Value Ref Range    Cholesterol, total 162 100 - 199 mg/dL    Triglyceride 42 0 - 149 mg/dL    HDL Cholesterol 63 >39 mg/dL    VLDL, calculated 8 5 - 40 mg/dL    LDL, calculated 91 0 - 99 mg/dL    LDL/HDL Ratio 1.4 0.0 - 3.2 ratio units   METABOLIC PANEL, COMPREHENSIVE    Collection Time: 10/14/16  9:48 AM   Result Value Ref Range    Glucose 92 65 - 99 mg/dL    BUN 14  8 - 27 mg/dL    Creatinine 0.93 0.57 - 1.00 mg/dL    GFR est non-AA 67 >59 mL/min/1.73    GFR est AA 77 >59 mL/min/1.73    BUN/Creatinine ratio 15 12 - 28     Sodium 144 134 - 144 mmol/L    Potassium 3.7 3.5 - 5.2 mmol/L    Chloride 100 96 - 106 mmol/L    CO2 28 18 - 29 mmol/L    Calcium 9.3 8.7 - 10.3 mg/dL    Protein, total 7.2 6.0 - 8.5 g/dL    Albumin 4.3 3.6 - 4.8 g/dL    GLOBULIN, TOTAL 2.9 1.5 - 4.5 g/dL    A-G Ratio 1.5 1.2 - 2.2    Bilirubin, total 0.3 0.0 - 1.2 mg/dL    Alk. phosphatase 91 39 - 117 IU/L    AST (SGOT) 28 0 - 40 IU/L    ALT (SGPT) 20 0 - 32 IU/L   AMB POC TUBERCULOSIS, INTRADERMAL (SKIN TEST)    Collection Time: 10/16/16 10:40 AM   Result Value Ref Range    PPD  Negative    mm Induration 0JLemonde mm        Imaging:  Results for orders placed during the hospital encounter of 10/15/16   CT CHEST ABD PELV W CONT    Narrative CT SCAN OF THE CHEST WITH CONTRAST: 10/15/2016  Indication: Loss of appetite  Comparison: None   100cc of Isovue 350  was used in the detection of thoracic pathology.  Dose reduction techniques used: Automated exposure control, adjustment of the  mAs and/or kVp according to patient size, standardized low-dose protocol, and/or  iterative reconstruction technique.  Findings: Tracheobronchial tree is intact. There are multiple noncalcified  bilateral lung nodules of which the largest is in the left upper lobe measuring  12 mm. Thyroid is unremarkable. There is no significant mediastinal adenopathy.  Bony structures and the soft tissues are intact    CT scan the abdomen with contrast:    There are multiple low-density lesions throughout the hepatic parenchyma of  which the largest is in the left lobe measuring 20 mm x 25 mm. Gallstones are  present. Adrenal glands, pancreas, spleen and spleen is unremarkable. Right  kidney is unremarkable. There are multiple subcentimeter left renal cyst. Aorta,  small and large bowel, appendix, soft tissues, and the bony structures are  unremarkable. There is no significant adenopathy.    CT scan of pelvis with contrast:    The uterus, adnexal structures, bladder, soft tissues and the bony  structures  are unremarkable for mass lesions.      Impression IMPRESSION:: Gallstones, small left renal cysts, hepatic and pulmonary  metastatic lesions     ASSESSMENT:    ICD-10-CM ICD-9-CM    1. Liver lesion K76.9 573.8 CBC WITH AUTOMATED DIFF      CEA      LD      CANCER ANTIGEN (CA) 15-3      CANCER AG 19-9      PROTHROMBIN TIME + INR      PTT      US GUIDE BX LIV PERC      PET/CT TUMOR IMAGE WH BDY W (INI)   2. Multiple pulmonary nodules determined by computed tomography of lung R91.8 793.19    3. Anorexia R63.0 783.0    4. Weight loss R63.4 783.21    5. Neoplastic malignant related fatigue R53.0 780.79      Problem List  Date  Reviewed: 10/16/2016          Codes Class Noted    Liver lesion ICD-10-CM: K76.9  ICD-9-CM: 573.8  10/16/2016        Osteopenia ICD-10-CM: M85.80  ICD-9-CM: 733.90  08/18/2013        HTN (hypertension) ICD-10-CM: I10  ICD-9-CM: 401.9  08/18/2013        Hyperlipidemia ICD-10-CM: E78.5  ICD-9-CM: 272.4  08/18/2013                PLAN:  Lab studies and imaging studies (CT) were personally reviewed.    Pertinent old records were reviewed.    Liver and lung lesions: with no obvious primary on CT, but ultrasound suggesting small subcentimeter lesions near the tail of the pancreas.  History of melanoma in 2008 resected from the back.  Symptoms including fatigue, malaise, anorexia, weight loss.      I discussed the results of the imaging studies with Katelyn [name].  The CT (and ultrasound) are highly worrisome for metastatic disease.  The most important step for diagnosis is obtaining tissue.  The lesions in the liver will be the most accessible for biopsy, this can be done via ultrasound guidance, we will arrange with IR.  I would also recommend blood tests today, including CEA, Ca 19-9, Ca 15-3, and LDH (given her history of melanoma).  If carcinoma or melanoma is proven via biopsy, there will be no curative options for therapy.  Palliative systemic  therapy would depend on the histology.  I would also recommend PET/CT if cancer biopsy proven.  She does not feel that she needs supportive medications for nausea or pain currently, she will call with any new symptoms.  Discussed ways to get increased caloric intake including supplements, additions to smoothies/milkshakes, and not limiting calories for the purposes of weight maintenance.        All questions were asked and answered to the best of my ability.  F/u after biopsy and likely PET.            Greer Ee, MD  [facility] [place] [address]  Office : [phone]  Fax : [phone]

## 2016-10-22 ENCOUNTER — Inpatient Hospital Stay: Admit: 2016-10-22 | Payer: BLUE CROSS/BLUE SHIELD | Attending: Internal Medicine | Primary: Family Medicine

## 2016-10-22 ENCOUNTER — Inpatient Hospital Stay: Admit: 2016-10-22 | Payer: BLUE CROSS/BLUE SHIELD | Attending: Diagnostic Radiology | Primary: Family Medicine

## 2016-10-22 DIAGNOSIS — K769 Liver disease, unspecified: Secondary | ICD-10-CM

## 2016-10-22 MED ORDER — MIDAZOLAM 1 MG/ML IJ SOLN
1 mg/mL | INTRAMUSCULAR | Status: DC | PRN
Start: 2016-10-22 — End: 2016-10-26
  Administered 2016-10-22 (×3): via INTRAVENOUS

## 2016-10-22 MED ORDER — SODIUM CHLORIDE 0.9 % IV
Freq: Once | INTRAVENOUS | Status: AC
Start: 2016-10-22 — End: 2016-10-22

## 2016-10-22 MED ORDER — FENTANYL CITRATE (PF) 50 MCG/ML IJ SOLN
50 mcg/mL | INTRAMUSCULAR | Status: DC | PRN
Start: 2016-10-22 — End: 2016-10-26
  Administered 2016-10-22 (×3): via INTRAVENOUS

## 2016-10-22 MED ORDER — ONDANSETRON (PF) 4 MG/2 ML INJECTION
4 mg/2 mL | INTRAMUSCULAR | Status: AC
Start: 2016-10-22 — End: ?

## 2016-10-22 MED ORDER — LIDOCAINE HCL 2 % (20 MG/ML) IJ SOLN
20 mg/mL (2 %) | INTRAMUSCULAR | Status: AC
Start: 2016-10-22 — End: ?

## 2016-10-22 MED ORDER — ONDANSETRON (PF) 4 MG/2 ML INJECTION
4 mg/2 mL | INTRAMUSCULAR | Status: DC | PRN
Start: 2016-10-22 — End: 2016-10-26
  Administered 2016-10-22: 17:00:00 via INTRAVENOUS

## 2016-10-22 MED ORDER — SODIUM CHLORIDE 0.9% BOLUS IV
0.9 % | Freq: Once | INTRAVENOUS | Status: AC
Start: 2016-10-22 — End: 2016-10-22
  Administered 2016-10-22: 19:00:00 via INTRAVENOUS

## 2016-10-22 MED ORDER — SALINE PERIPHERAL FLUSH PRN
Freq: Once | INTRAMUSCULAR | Status: AC
Start: 2016-10-22 — End: 2016-10-22
  Administered 2016-10-22: 19:00:00

## 2016-10-22 MED ORDER — LIDOCAINE HCL 2 % (20 MG/ML) IJ SOLN
20 mg/mL (2 %) | Freq: Once | INTRAMUSCULAR | Status: AC
Start: 2016-10-22 — End: 2016-10-22
  Administered 2016-10-22: 15:00:00 via SUBCUTANEOUS

## 2016-10-22 MED ORDER — IOPAMIDOL 76 % IV SOLN
370 mg iodine /mL (76 %) | Freq: Once | INTRAVENOUS | Status: AC
Start: 2016-10-22 — End: 2016-10-22
  Administered 2016-10-22: 19:00:00 via INTRAVENOUS

## 2016-10-22 MED ORDER — SODIUM CHLORIDE 0.9% BOLUS IV
0.9 % | Freq: Once | INTRAVENOUS | Status: AC
Start: 2016-10-22 — End: 2016-10-22
  Administered 2016-10-22: 18:00:00 via INTRAVENOUS

## 2016-10-22 MED ORDER — HYDROCODONE-ACETAMINOPHEN 7.5 MG-325 MG TAB
ORAL | Status: DC | PRN
Start: 2016-10-22 — End: 2016-10-26
  Administered 2016-10-22: 15:00:00 via ORAL

## 2016-10-22 MED ORDER — FENTANYL CITRATE (PF) 50 MCG/ML IJ SOLN
50 mcg/mL | INTRAMUSCULAR | Status: AC
Start: 2016-10-22 — End: ?

## 2016-10-22 MED ORDER — HYDROCODONE-ACETAMINOPHEN 7.5 MG-325 MG TAB
ORAL | Status: AC
Start: 2016-10-22 — End: ?

## 2016-10-22 MED ORDER — MIDAZOLAM 1 MG/ML IJ SOLN
1 mg/mL | INTRAMUSCULAR | Status: AC
Start: 2016-10-22 — End: ?

## 2016-10-22 MED FILL — HYDROCODONE-ACETAMINOPHEN 7.5 MG-325 MG TAB: ORAL | Qty: 1

## 2016-10-22 MED FILL — ONDANSETRON (PF) 4 MG/2 ML INJECTION: 4 mg/2 mL | INTRAMUSCULAR | Qty: 2

## 2016-10-22 MED FILL — MIDAZOLAM 1 MG/ML IJ SOLN: 1 mg/mL | INTRAMUSCULAR | Qty: 4

## 2016-10-22 MED FILL — FENTANYL CITRATE (PF) 50 MCG/ML IJ SOLN: 50 mcg/mL | INTRAMUSCULAR | Qty: 4

## 2016-10-22 MED FILL — XYLOCAINE 20 MG/ML (2 %) INJECTION SOLUTION: 20 mg/mL (2 %) | INTRAMUSCULAR | Qty: 20

## 2016-10-22 NOTE — Procedures (Signed)
Date of Procedure: 10/22/2016    Pre-Procedure Diagnosis: Multiple liver masses with history of melanoma    Post-Procedure Diagnosis: SAME    Procedure(s): Diagnostic liver biopsy    Brief Description of Procedure: Same    Performed By: Theron Arista, MD     Assistants: None    Anesthesia: Moderate sedation and local    Estimated Blood Loss: Minimal    Specimens: Multiple 18-gauge core biopsy samples obtained from 3 separate lesions    Implants: None    Findings: Multiple liver lesions    Complications: None    Recommendations: Routine postbiopsy recovery    Follow Up: As needed

## 2016-10-22 NOTE — Progress Notes (Signed)
Patient hypotensive and complains of feeling "bloated" around her epigastric region.  Dr Exie Parody notified and orders received.

## 2016-10-22 NOTE — H&P (Signed)
Interventional Radiology History and Physical (Outpatient)    10/22/2016    Patient: Katelyn [name] 60 y.o. female     Referring Physician:  Greer Ee, MD    Chief Complaint: liver masses    History of Present Illness: history of melanoma ten years ago for diagnostic biopsy.    History:  Past Medical History:   Diagnosis Date   ??? Cancer ([place])     melanoma - back   ??? Hypercholesterolemia    ??? Hypertension     managed with meds   ??? Nausea & vomiting    ??? Osteopenia      Family History   Problem Relation Age of Onset   ??? Hypertension Mother    ??? Elevated Lipids Mother    ??? Elevated Lipids Father    ??? Hypertension Father    ??? Arthritis-osteo Father      spine   ??? Heart Disease Father    ??? Breast Cancer Other    ??? Diabetes Sister      Social History     Social History   ??? Marital status: MARRIED     Spouse name: N/A   ??? Number of children: N/A   ??? Years of education: N/A     Occupational History   ??? Not on file.     Social History Main Topics   ??? Smoking status: Never Smoker   ??? Smokeless tobacco: Never Used   ??? Alcohol use No   ??? Drug use: Not on file   ??? Sexual activity: Not on file     Other Topics Concern   ??? Not on file     Social History Narrative       Allergies:   Allergies   Allergen Reactions   ??? Adhesive Tape-Silicones Rash       Prior to Admission Medications:  Prior to Admission medications    Medication Sig Start Date End Date Taking? Authorizing Provider   simvastatin (ZOCOR) 20 mg tablet Take 1 Tab by mouth nightly. 10/14/16  Yes Dianna Rossetti, MD   amLODIPine (NORVASC) 10 mg tablet Take 1 Tab by mouth nightly. 10/14/16  Yes Dianna Rossetti, MD   losartan-hydroCHLOROthiazide [facility]) 100-25 mg per tablet Take 1 Tab by mouth daily. 10/14/16  Yes Dianna Rossetti, MD   multivitamin (ONE A DAY) tablet Take 1 Tab by mouth daily.   Yes Historical Provider   TURMERIC ROOT EXTRACT PO Take  by mouth daily.   Yes Historical Provider    ferrous sulfate (IRON) 325 mg (65 mg iron) tablet Take  by mouth Daily (before breakfast).   Yes Historical Provider   fluticasone (FLONASE) 50 mcg/actuation nasal spray 2 Sprays by Both Nostrils route nightly.   Yes Historical Provider   calcium-cholecalciferol, d3, (CALCIUM 600 + D) 600-125 mg-unit tab Take  by mouth three (3) times daily.   Yes Historical Provider   cetirizine (ZYRTEC) 10 mg tablet Take  by mouth.   Yes Historical Provider   aspirin delayed-release 81 mg tablet Take  by mouth nightly. Indications: prevention of transient ischemic attack    Historical Provider       Physical Exam:    Blood pressure 151/90, pulse 80, temperature 98.6 ??F (37 ??C), resp. rate 18, SpO2 96 %, not currently breastfeeding.  General: alert, cooperative, no distress, appears stated age    Plan of Care/Planned Procedure:  Risks, benefits, and alternatives reviewed with patient and she agrees to proceed with  the procedure.     Theron Arista, MD

## 2016-10-22 NOTE — Progress Notes (Signed)
Patient back from CT.  Denies complaints.

## 2016-10-22 NOTE — Progress Notes (Signed)
TRANSFER - OUT REPORT:    Verbal report given to [place], RN on Katelyn [name]  being transferred to IR PACU for routine post - op       Report consisted of patient???s Situation, Background, Assessment and   Recommendations(SBAR).     Information from the following report(s) SBAR, Kardex, Procedure Summary, Intake/Output and MAR was reviewed with the receiving nurse.    Lines:       Opportunity for questions and clarification was provided.

## 2016-10-22 NOTE — Progress Notes (Signed)
TRANSFER - IN REPORT:    Verbal report received from [place], Therapist, sports (name) on CAISLEY SCIARRETTA  being received from IR procedure (unit) for routine progression of care      Report consisted of patient???s Situation, Background, Assessment and   Recommendations(SBAR).     Information from the following report(s) Procedure Summary and MAR was reviewed with the receiving nurse.    Opportunity for questions and clarification was provided.      Assessment completed upon patient???s arrival to unit and care assumed.

## 2016-10-22 NOTE — Progress Notes (Signed)
Patient in US for procedure.

## 2016-10-22 NOTE — Progress Notes (Signed)
Dr Exie Parody at bedside.

## 2016-10-22 NOTE — Progress Notes (Signed)
IR Nurse Pre-Procedure Checklist Part 2          Consent signed: Yes    H&P complete:  Yes    Antibiotics: Not applicable    Airway/Mallampati Done: Yes    Shaved: Not applicable    Pregnancy Form:Not applicable    Patient Position: Yes    MD Side: Yes     Biopsy Worksheet: Yes    Specimen Medium: Yes

## 2016-10-22 NOTE — Progress Notes (Signed)
Pt complaining of nausea. Verbal order received form K. Carruthers PA-C to administer 4 mg of Zofran, Once, IV.

## 2016-10-22 NOTE — Progress Notes (Signed)
Discharge instructions given.  Dual site assessment done.  Patient discharged home.

## 2016-10-29 ENCOUNTER — Inpatient Hospital Stay: Admit: 2016-10-29 | Payer: BLUE CROSS/BLUE SHIELD | Primary: Family Medicine

## 2016-10-29 DIAGNOSIS — K769 Liver disease, unspecified: Secondary | ICD-10-CM

## 2016-10-29 MED ORDER — F-18 FLUORODEOXYGLUCOSE
Freq: Once | Status: AC
Start: 2016-10-29 — End: 2016-10-29
  Administered 2016-10-29: 21:00:00 via INTRAVENOUS

## 2016-10-29 MED ORDER — DIATRIZOATE MEGLUMINE & SODIUM 66 %-10 % ORAL SOLN
66-10 % | Freq: Once | ORAL | Status: AC
Start: 2016-10-29 — End: 2016-10-29
  Administered 2016-10-29: 21:00:00 via ORAL

## 2016-10-29 NOTE — Progress Notes (Signed)
SW received referral from register Lanelle Bal to speak with pt's daughter, Eugene Garnet, at her request. SW met briefly with pt's daughter to introduce self and psychosocial services. Pt's daughter verbalized concerns about pt's upcoming appointment and uncertainty about diagnosis/prognosis and familial discussion. SW provided therapeutic presence, psychosocial support, and normalized feelings of uncertainty. She verbalized understanding and appreciation. No other needs. SW provided SW contact information and encouraged her to call should any needs arise and she verbalized understanding.     This note will not be viewable in [place].

## 2016-10-30 ENCOUNTER — Encounter

## 2016-10-30 ENCOUNTER — Inpatient Hospital Stay: Admit: 2016-10-30 | Payer: BLUE CROSS/BLUE SHIELD | Attending: Internal Medicine | Primary: Family Medicine

## 2016-10-30 ENCOUNTER — Ambulatory Visit
Admit: 2016-10-30 | Discharge: 2016-10-30 | Payer: BLUE CROSS/BLUE SHIELD | Attending: Internal Medicine | Primary: Family Medicine

## 2016-10-30 ENCOUNTER — Encounter: Primary: Family Medicine

## 2016-10-30 DIAGNOSIS — C439 Malignant melanoma of skin, unspecified: Secondary | ICD-10-CM

## 2016-10-30 DIAGNOSIS — C787 Secondary malignant neoplasm of liver and intrahepatic bile duct: Secondary | ICD-10-CM

## 2016-10-30 MED ORDER — GADOBENATE DIMEGLUMINE 529 MG/ML (0.1 MMOL/0.2 ML) IV
529 mg/mL (0.1mmol/0.2mL) | Freq: Once | INTRAVENOUS | Status: AC
Start: 2016-10-30 — End: 2016-10-30
  Administered 2016-10-30: 23:00:00 via INTRAVENOUS

## 2016-10-30 MED ORDER — SALINE PERIPHERAL FLUSH PRN
Freq: Once | INTRAMUSCULAR | Status: AC
Start: 2016-10-30 — End: 2016-10-30
  Administered 2016-10-30: 23:00:00

## 2016-10-30 MED ORDER — DEXAMETHASONE 4 MG TAB
4 mg | ORAL_TABLET | Freq: Two times a day (BID) | ORAL | 2 refills | Status: DC
Start: 2016-10-30 — End: 2017-03-03

## 2016-10-30 NOTE — Progress Notes (Signed)
[facility]: Office Visit Established Patient    Chief Complaint:    Chief Complaint   Patient presents with   ??? Follow-up         History of Present Illness:  Katelyn [name] is a 60 y.o. 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  She has a history of osteopenia, hypertension, hypercholesterolemia, and melanoma of the back s/p excision in 2008, sentinel node was negative but she is unsure what T stage.  She initially presented to her PCP on 10/14/16 reporting a one month history of intermittent upper abdominal pain with loss of appetite and right scapular pain radiating to her right arm.  Ultrasound of the abdomen, obtained on 10/15/16, identified multiple lesions in the liver, concerning for metastatic disease, as well as small hypoechoic areas near the tail of the pancreas, possibly adenopathy.  Further imaging was performed with CT scan of the chest, this showed hepatic and pulmonary lesions highly concerning for metastatic disease.  Lung nodules measured up to 12 mm and liver lesions measured up to 25 mm.  She was referred for urgent oncologic consultation for evaluation and management options.  We recommended biopsy of a liver lesion and PET/CT for staging.     She returns for follow-up.  She reports feeling better overall, she has rationed her activity level better and is getting less tired, and also feels she is able to eat better.  She is eating things that are not typical for her, things that are not as healthy, but she has been giving herself some leeway.  Some symptoms she is having now that weren't reported previously include some blurry vision/diplopia of the left eye, as well as some mild intermittent confusion noted by family members.  She is accompanied by two of her daughters today (of 5 children total).          Review of Systems:  Constitutional: Positive for malaise/fatigue.   HENT: Negative.   Eyes: Positive for blurry vision.    Respiratory: Negative.   Cardiovascular: Negative.   Gastrointestinal: Positive for anorexia and weight loss.   Genitourinary: Negative.   Musculoskeletal: Negative.   Skin: Negative.   Neurological: Positive for confusion.   Endo/Heme/Allergies: Negative.   Psychiatric/Behavioral: Negative.   All other systems reviewed and are negative.     Allergies   Allergen Reactions   ??? Adhesive Tape-Silicones Rash     Past Medical History:   Diagnosis Date   ??? Cancer ([place])     melanoma - back   ??? Hypercholesterolemia    ??? Hypertension     managed with meds   ??? Nausea & vomiting    ??? Osteopenia      Past Surgical History:   Procedure Laterality Date   ??? HX COLONOSCOPY     ??? HX GYN      c-section x 5   ??? HX ORTHOPAEDIC Left 04/30/2016    knee   ??? HX OTHER SURGICAL      melanoma removed from back     Family History   Problem Relation Age of Onset   ??? Hypertension Mother    ??? Elevated Lipids Mother    ??? Elevated Lipids Father    ??? Hypertension Father    ??? Arthritis-osteo Father      spine   ??? Heart Disease Father    ??? Breast Cancer Other    ??? Diabetes Sister      Social  History     Social History   ??? Marital status: MARRIED     Spouse name: N/A   ??? Number of children: N/A   ??? Years of education: N/A     Occupational History   ??? Not on file.     Social History Main Topics   ??? Smoking status: Never Smoker   ??? Smokeless tobacco: Never Used   ??? Alcohol use No   ??? Drug use: Not on file   ??? Sexual activity: Not on file     Other Topics Concern   ??? Not on file     Social History Narrative     Current Outpatient Prescriptions   Medication Sig Dispense Refill   ??? dexamethasone (DECADRON) 4 mg tablet Take 1 Tab by mouth two (2) times daily (with meals). 60 Tab 2   ??? simvastatin (ZOCOR) 20 mg tablet Take 1 Tab by mouth nightly. 90 Tab 3   ??? amLODIPine (NORVASC) 10 mg tablet Take 1 Tab by mouth nightly. 90 Tab 3   ??? losartan-hydroCHLOROthiazide (HYZAAR) 100-25 mg per tablet Take 1 Tab by mouth daily. 90 Tab 3    ??? multivitamin (ONE A DAY) tablet Take 1 Tab by mouth daily.     ??? TURMERIC ROOT EXTRACT PO Take  by mouth daily.     ??? ferrous sulfate (IRON) 325 mg (65 mg iron) tablet Take  by mouth Daily (before breakfast).     ??? fluticasone (FLONASE) 50 mcg/actuation nasal spray 2 Sprays by Both Nostrils route nightly.     ??? aspirin delayed-release 81 mg tablet Take  by mouth nightly. Indications: prevention of transient ischemic attack     ??? calcium-cholecalciferol, d3, (CALCIUM 600 + D) 600-125 mg-unit tab Take  by mouth three (3) times daily.     ??? cetirizine (ZYRTEC) 10 mg tablet Take  by mouth.         OBJECTIVE:  Visit Vitals   ??? BP (!) 149/97  Comment: standing   ??? Pulse 95   ??? Temp 98.3 ??F (36.8 ??C) (Oral)   ??? Resp 21   ??? Ht 5\' 7"  (1.702 m)   ??? Wt 143 lb (64.9 kg)   ??? SpO2 93%   ??? BMI 22.4 kg/m2       Physical Exam:  Constitutional: Well developed, well nourished female in no acute distress, sitting comfortably on the examination table.    HEENT: Normocephalic and atraumatic. Oropharynx is clear, mucous membranes are moist.  Sclerae anicteric. Neck supple without JVD. No thyromegaly present.    Lymph node   No palpable submandibular, cervical, supraclavicular, axillary lymph nodes.   Skin Warm and dry.  No bruising and no rash noted.  No erythema.  No pallor.    Respiratory Lungs are clear to auscultation bilaterally without wheezes, rales or rhonchi, normal air exchange without accessory muscle use.    CVS Normal rate, regular rhythm and normal S1 and S2.  No murmurs, gallops, or rubs.   Abdomen Soft, nontender and nondistended, normoactive bowel sounds.  No palpable mass.  No hepatosplenomegaly.   Neuro Grossly nonfocal with no obvious sensory or motor deficits.   MSK Normal range of motion in general.  No edema and no tenderness.   Psych Appropriate mood and affect.      Labs:  No results found for this or any previous visit (from the past 336 hour(s)).     Imaging:  PET/CT: 10/29/2016  ??   INDICATION: Liver lesions.  History melanoma in 2008.  ??  TECHNIQUE: After oral administration of gastroview and intravenous  administration of 15.44 mCi of F18 FDG, noncontrast CT images were obtained for  attenuation correction and for fusion with emission PET images. A series of  overlapping emission PET images were then obtained beginning 60 minutes after  injection of FDG. The area imaged spanned the region from the skull base to the  mid thighs.  ??  COMPARISON: CT chest, abdomen, and pelvis 10/15/2016  ??  FINDINGS:   NECK/CHEST:  Abnormal hyperdense brain masses are seen located in the left posterior temporal  lobe seen CT image 30 measuring 2.7 cm x 2.4 cm, and in the right frontal lobe  on image 28 measuring 1.2 cm x 0.8 cm. It is uncertain whether these represent  subacute spontaneous hemorrhages, hyperdense intracranial metastases, or  metastases which have subacutely hemorrhage. Mild abnormal PET activity is  favored to correspond to these findings both seen on PET image 302 and therefore  hyperdense intracranial metastases or intracranial metastases which have  subacutely hemorrhage are favored. These should be further assessed with a  contrasted brain MRI. Additional edema seen in the right parietal lobe on image  21 without a definite hyperdense abnormality.  ??  No abnormal activity is seen in the neck. No adenopathy is seen.  ??  Abnormal activity is seen associated with prevascular lymph nodes the largest of  which is seen on image 109 measuring 1.3 cm x 1.1 cm concerning for metastatic  mediastinal adenopathy. In addition, abnormal activity is seen associated with  the previously described bilateral pulmonary nodules with the largest nodule  located in the medial right lower lobe on image 138 measuring 1.9 cm x 1.4 cm  consistent with pulmonary metastases. A subcutaneous lesion is seen in the left  back soft tissues on image 140 measuring 1.4 cm x 0.9 cm which demonstrates mild   activity raising concern for potential subcutaneous lesion such as recurrent  melanoma.  ??  ABDOMEN/PELVIS:  Abnormal activity is seen associated with previous as described hepatic lesions  consistent with hepatic metastases. The largest lesion is felt to reside in the  right lobe seen on image 151 measuring 3.5 cm x 3.2 cm. No worrisome activity is  otherwise seen. There is a 5 mm subcutaneous lesion in the right anterior  abdominal wall seen on CT image 202. No abnormal activity is seen associated  with this lesion although activity is likely underestimate given its small size.  An additional melanoma cannot be excluded. A gallstone is seen within a  noninflamed gallbladder.  ??  Only physiologic activity is seen in the pelvis.  ??  LEGS:  No hypermetabolic lesion or worrisome subcutaneous kidneys lesion is seen in the  lower extremities.  ??  IMPRESSION  IMPRESSION:   1. Small subcutaneous lesions in the left posterior back, and right anterior  abdominal wall whose appearance is concerning for recurrent melanoma.  ??  2. Abnormal activity associated with prior hepatic and pulmonary lesions  consistent with metastatic lesions. In addition, abnormal activity is seen  associated with mediastinal lymph nodes consistent with additional nodal  metastatic disease. Lastly, there are abnormal hyperdense masses in the brain  which demonstrate mild activity raising concern for intracranial metastases.  Given the hyperdense appearance, the possibility of subacute hemorrhage cannot  be excluded. These should be further assessed with a contrasted MRI of the  Brain.    Results for orders placed during the hospital encounter  of 10/15/16   CT CHEST ABD PELV W CONT    Narrative CT SCAN OF THE CHEST WITH CONTRAST: 10/15/2016  Indication: Loss of appetite  Comparison: None   100cc of Isovue 350  was used in the detection of thoracic pathology.  Dose reduction techniques used: Automated exposure control, adjustment of the   mAs and/or kVp according to patient size, standardized low-dose protocol, and/or  iterative reconstruction technique.  Findings: Tracheobronchial tree is intact. There are multiple noncalcified  bilateral lung nodules of which the largest is in the left upper lobe measuring  12 mm. Thyroid is unremarkable. There is no significant mediastinal adenopathy.  Bony structures and the soft tissues are intact    CT scan the abdomen with contrast:    There are multiple low-density lesions throughout the hepatic parenchyma of  which the largest is in the left lobe measuring 20 mm x 25 mm. Gallstones are  present. Adrenal glands, pancreas, spleen and spleen is unremarkable. Right  kidney is unremarkable. There are multiple subcentimeter left renal cyst. Aorta,  small and large bowel, appendix, soft tissues, and the bony structures are  unremarkable. There is no significant adenopathy.    CT scan of pelvis with contrast:    The uterus, adnexal structures, bladder, soft tissues and the bony structures  are unremarkable for mass lesions.      Impression IMPRESSION:: Gallstones, small left renal cysts, hepatic and pulmonary  metastatic lesions     Pathology:        ASSESSMENT:    ICD-10-CM ICD-9-CM    1. 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 (HCC) C78.7 197.7      172.9    2. Malignant neoplasm metastatic to lung, unspecified laterality (HCC) C78.00 197.0    3. Brain lesion G93.9 348.89      Problem List  Date Reviewed: 2016-11-07          Codes Class Noted    Liver lesion ICD-10-CM: K76.9  ICD-9-CM: 573.8  10/16/2016        Osteopenia ICD-10-CM: M85.80  ICD-9-CM: 733.90  08/18/2013        HTN (hypertension) ICD-10-CM: I10  ICD-9-CM: 401.9  08/18/2013        Hyperlipidemia ICD-10-CM: E78.5  ICD-9-CM: 272.4  08/18/2013                PLAN:  Lab studies and imaging studies (CT) were personally reviewed.    Pertinent old records were reviewed.    Melanoma: metastatic to liver, lungs, nodes, and also concern for brain  lesions on CT imaging from PET.  History of melanoma in 2008 resected from the back.  Symptoms including fatigue, malaise, anorexia, weight loss, blurry vision, mild intermittent confusion.      I discussed the results of the pathology and imaging with Ms. Marschke and her daughters.  Her liver biopsy is definitive for metastatic melanoma.  This is stage IV disease.  There are no curative options for therapy.  The first priority is identifying any brain lesions, we have ordered an MRI to be done today.  I have already spoken with Dr. Raul Del and Dr. Maceo Pro in case she needs surgery for resection of brain lesion(s); if not, she will require radiation.  If mets confirmed on MRI, start dex 4 mg BID.  For systemic therapy, BRAF mutation status has been ordered and is pending, if BRAF WT, she will require immunotherapy, I recommend ipilimumab and nivolumab in combination.  If she is BRAF mutated, either immunotherapy or BRAF/MEK  targeting therapy would be appropriate, in that case I would recommend that they consider [phone] trial which compares these two approaches.  They understand and agree to proceed.     All questions were asked and answered to the best of my ability.  F/u after local therapy to brain metastases (presumably) for systemic therapy discussion.            Greer Ee, MD  [facility] [place] [place] [address]  Office : [phone]  Fax : [phone]

## 2016-10-30 NOTE — Telephone Encounter (Signed)
Pt prefers mychart message sent. Message from Dr. Darnell Level has been sent.

## 2016-10-30 NOTE — Progress Notes (Signed)
Call received from Dr Bari Edward in regards to patient PET scan results. Saw some hyperdense brain masses that could be hemmorhages , or mets or mets that have hemmorhaged. Information given to Dr. Jenetta Loges. Patient is being seen in office today.

## 2016-10-30 NOTE — Telephone Encounter (Signed)
Pt recently diagnosed with melanoma.  Dr Lamar Blinks would like nurse to touch base with pt and see if she is comfortable with care in the [place]area/what has been recommended by specialist or if she would rather see a specialist in [place] or [place].

## 2016-10-30 NOTE — Patient Instructions (Addendum)
Patient Instructions From Your Nurse    Reason for Visit:  Follow up visit    Plan:  -We would like you to get an MRI done of your brain this evening  -Your PET scan did confirm spots in the liver and the lung are cancer.    -Your liver biopsy did show melanoma  -If the melanoma spots have moved to the brain you can either do radiation localized to the brain (cyberknife radiation is incredibly technology that we can offer you that is very precise) or you could surgically remove those brain spots.  We can speak with our neurosurgeons to see if they think it is possible to remove these spots.  -The systemic treatment for melanoma does not cross over to the brain so that treatment has to occur separately.  -The treatments we have for melanoma are drastically better than in the past.  We have some great immunotherapy (IV medication that uses your own immune system to fight the tumor.  This is the treatment that Loyal Jacobson received for his Melanoma).  We also have a targeted therapy that specifically attacks a certain gene mutation on your tumor.  We will check for the B-RAF mutation on your tumor.      Follow Up:  -Follow up with Dr. Jenetta Loges or NP in     Recent Lab Results:  No results found for this or any previous visit (from the past 12 hour(s)).      Care plan has been given to patient: n/a        -------------------------------------------------------------------------------------------------------------------  Please call our office at [phone] if you have any  of the following symptoms:   ?? Fever of 100.5 or greater  ?? Chills  ?? Shortness of breath  ?? Swelling or pain in one leg    After office hours an answering service is available and will contact a provider for emergencies or if you are experiencing any of the above symptoms.    ? Patient did express an interest in My Chart.  My Chart log in information explained on the after visit summary printout at the [place] desk.    Salley Slaughter. Bobby Rumpf, RN

## 2016-11-03 ENCOUNTER — Telehealth: Payer: Self-pay | Admitting: *Deleted

## 2016-11-03 NOTE — Telephone Encounter (Signed)
Spoke with Dr. Donneta RombergBrahmanday- this request needs to be deferred to her pcp.  Dr. Donneta RombergBrahmanday is unable to justify the use of a space heater with pt's dx.  I personally contacted the patient. "I don't understand this. I have a co-worker with the same condition, whom had the same surgery and whom also goes to the same clinic as me. Why does she get a letter and I can not get the same letter. How do you explain this."  I explained, "I am unable to confirm or comment on what letter or diagnosis your coworker has received or who her treating provider currently is and even confirm if she is a patient of our practice. Dr. Donneta RombergBrahmanday asks that you talk to your primary care provider Dr. Adriana Simasook to provide a letter for the space heater."  The patient stated, "I am not satisfied with this reply or your answer. I don't agree with you on this."  I replied, "Unfornately, Dr. Donneta RombergBrahmanday is unable to supply this letter to justify the use of a space heater in the workplace."

## 2016-11-03 NOTE — Telephone Encounter (Addendum)
Pt requested to have a note to have a space heater at work. Pt currently has a iron deficiency,that causes her to stay cold.  Pt contact [phone]  Please fax this to [phone] [address] Rosetta PosnerBob Ring

## 2016-11-03 NOTE — Telephone Encounter (Signed)
-----   Message from Vanita PandaLoretta T Miceli, RN sent at 11/02/2016  1:43 PM EST ----- Regarding: Letter for patient Patient needs a letter for a space heater for her work place signed by MD..  Please fax letter to  [facility] [phone]

## 2016-11-03 NOTE — Telephone Encounter (Signed)
Recommendations?

## 2016-11-04 ENCOUNTER — Encounter: Payer: Self-pay | Admitting: Family Medicine

## 2016-11-04 NOTE — Telephone Encounter (Signed)
A voicemail was left for pt stating letter was faxed.

## 2016-11-04 NOTE — Telephone Encounter (Signed)
Letter given to CMA, Ashleigh.

## 2016-11-04 NOTE — Telephone Encounter (Signed)
Dr Lamar Blinks has contacted pt.

## 2016-11-04 NOTE — Telephone Encounter (Signed)
Patient called to advise Dr. Darnell Level of her Cancer diagnosis.  Please call [phone]

## 2016-11-10 ENCOUNTER — Ambulatory Visit: Payer: Self-pay | Admitting: Physician Assistant

## 2016-11-10 ENCOUNTER — Encounter: Payer: Self-pay | Admitting: Physician Assistant

## 2016-11-10 VITALS — BP 142/90 | HR 69 | Temp 98.2°F

## 2016-11-10 DIAGNOSIS — H01004 Unspecified blepharitis left upper eyelid: Secondary | ICD-10-CM

## 2016-11-10 MED ORDER — ERYTHROMYCIN 5 MG/GM OP OINT: TOPICAL_OINTMENT | OPHTHALMIC | 2 refills | Status: DC

## 2016-11-10 NOTE — Progress Notes (Signed)
S: states left upper lid is itchy and red, a little crusted in the mornings but doesn't feel like pink eye, states she has not changed makeup or soaps, no fever/chills  O: vitals wnl, nad, left upper lid irritated, no drainage, conjunctivitis wnl, neck supple no lymph, lungs c t a, cv rrr  A: acute blepharitis  P: e-mycin opth ointment

## 2016-11-18 ENCOUNTER — Inpatient Hospital Stay
Admit: 2016-11-18 | Discharge: 2016-11-23 | Disposition: A | Discharge status: Home Health | Payer: BLUE CROSS/BLUE SHIELD | Attending: Internal Medicine | Admitting: Internal Medicine

## 2016-11-18 ENCOUNTER — Emergency Department: Admit: 2016-11-18 | Payer: BLUE CROSS/BLUE SHIELD | Primary: Family Medicine

## 2016-11-18 ENCOUNTER — Encounter

## 2016-11-18 ENCOUNTER — Encounter: Primary: Family Medicine

## 2016-11-18 DIAGNOSIS — I619 Nontraumatic intracerebral hemorrhage, unspecified: Secondary | ICD-10-CM

## 2016-11-18 LAB — CBC WITH AUTOMATED DIFF
ABS. BASOPHILS: 0 10*3/uL (ref 0.0–0.2)
ABS. EOSINOPHILS: 0 10*3/uL (ref 0.0–0.8)
ABS. IMM. GRANS.: 0.1 10*3/uL (ref 0.0–0.5)
ABS. LYMPHOCYTES: 0.3 10*3/uL — ABNORMAL LOW (ref 0.5–4.6)
ABS. MONOCYTES: 0.6 10*3/uL (ref 0.1–1.3)
ABS. NEUTROPHILS: 12 10*3/uL — ABNORMAL HIGH (ref 1.7–8.2)
BASOPHILS: 0 % (ref 0.0–2.0)
EOSINOPHILS: 0 % — ABNORMAL LOW (ref 0.5–7.8)
HCT: 41.3 % (ref 35.8–46.3)
HGB: 14.8 g/dL (ref 11.7–15.4)
IMMATURE GRANULOCYTES: 0 % (ref 0.0–5.0)
LYMPHOCYTES: 2 % — ABNORMAL LOW (ref 13–44)
MCH: 30 PG (ref 26.1–32.9)
MCHC: 35.8 g/dL — ABNORMAL HIGH (ref 31.4–35.0)
MCV: 83.6 FL (ref 79.6–97.8)
MONOCYTES: 5 % (ref 4.0–12.0)
MPV: 8.8 FL — ABNORMAL LOW (ref 10.8–14.1)
NEUTROPHILS: 93 % — ABNORMAL HIGH (ref 43–78)
PLATELET: 190 10*3/uL (ref 150–450)
RBC: 4.94 M/uL (ref 4.05–5.25)
RDW: 14.8 % — ABNORMAL HIGH (ref 11.9–14.6)
WBC: 13 10*3/uL — ABNORMAL HIGH (ref 4.3–11.1)

## 2016-11-18 LAB — METABOLIC PANEL, BASIC
Anion gap: 11 mmol/L (ref 7–16)
BUN: 23 MG/DL (ref 8–23)
CO2: 30 mmol/L (ref 21–32)
Calcium: 8.2 MG/DL — ABNORMAL LOW (ref 8.3–10.4)
Chloride: 95 mmol/L — ABNORMAL LOW (ref 98–107)
Creatinine: 0.76 MG/DL (ref 0.6–1.0)
GFR est AA: >60 mL/min/{1.73_m2} (ref >60)
GFR est non-AA: >60 mL/min/{1.73_m2} (ref >60)
Glucose: 144 mg/dL — ABNORMAL HIGH (ref 65–100)
Potassium: 3.4 mmol/L — ABNORMAL LOW (ref 3.5–5.1)
Sodium: 136 mmol/L (ref 136–145)

## 2016-11-18 LAB — GLUCOSE, POC: Glucose (POC): 147 MG/DL — ABNORMAL HIGH (ref 65–100)

## 2016-11-18 LAB — POC PT/INR
INR (POC): 0.9 (ref 0.9–1.2)
Prothrombin time (POC): 11.3 SECS (ref 9.6–11.6)

## 2016-11-18 LAB — PTT: aPTT: 22 s — ABNORMAL LOW (ref 23.5–31.7)

## 2016-11-18 MED ORDER — DEXAMETHASONE SODIUM PHOSPHATE 4 MG/ML IJ SOLN
4 mg/mL | Freq: Three times a day (TID) | INTRAMUSCULAR | Status: DC
Start: 2016-11-18 — End: 2016-11-18
  Administered 2016-11-18: via INTRAVENOUS

## 2016-11-18 MED ORDER — DABRAFENIB 75 MG CAPSULE
75 mg | ORAL_CAPSULE | Freq: Two times a day (BID) | ORAL | 3 refills | Status: DC
Start: 2016-11-18 — End: 2017-04-16

## 2016-11-18 MED ORDER — LABETALOL 5 MG/ML IV SOLN
5 mg/mL | INTRAVENOUS | Status: DC | PRN
Start: 2016-11-18 — End: 2016-11-19

## 2016-11-18 MED ORDER — ACETAMINOPHEN 325 MG TABLET
325 mg | ORAL | Status: DC | PRN
Start: 2016-11-18 — End: 2016-11-23

## 2016-11-18 MED ORDER — NICARDIPINE IN SODIUM CHLORIDE(ISO-OSMOTIC) 20 MG/200 ML IV PIGGY BACK
INTRAVENOUS | Status: DC
Start: 2016-11-18 — End: 2016-11-19
  Administered 2016-11-18 – 2016-11-19 (×3): via INTRAVENOUS

## 2016-11-18 MED ORDER — SODIUM CHLORIDE 0.9 % IJ SYRG
Freq: Three times a day (TID) | INTRAMUSCULAR | Status: DC
Start: 2016-11-18 — End: 2016-11-23
  Administered 2016-11-19 – 2016-11-23 (×13): via INTRAVENOUS

## 2016-11-18 MED ORDER — TRAMETINIB 2 MG TABLET
2 mg | ORAL_TABLET | Freq: Every day | ORAL | 3 refills | Status: DC
Start: 2016-11-18 — End: 2017-04-16

## 2016-11-18 MED ORDER — NALOXONE 0.4 MG/ML INJECTION
0.4 mg/mL | INTRAMUSCULAR | Status: DC | PRN
Start: 2016-11-18 — End: 2016-11-23

## 2016-11-18 MED ORDER — SODIUM CHLORIDE 0.9 % IV
500 mg/5 mL | Freq: Two times a day (BID) | INTRAVENOUS | Status: DC
Start: 2016-11-18 — End: 2016-11-19
  Administered 2016-11-18 – 2016-11-19 (×2): via INTRAVENOUS

## 2016-11-18 MED ORDER — PANTOPRAZOLE 40 MG IV SOLR
40 mg | Freq: Every day | INTRAVENOUS | Status: DC
Start: 2016-11-18 — End: 2016-11-19
  Administered 2016-11-18: via INTRAVENOUS

## 2016-11-18 MED ORDER — DABRAFENIB 75 MG CAPSULE
75 mg | ORAL_CAPSULE | Freq: Two times a day (BID) | ORAL | 3 refills | Status: DC
Start: 2016-11-18 — End: 2016-11-18

## 2016-11-18 MED ORDER — TRAMETINIB 2 MG TABLET
2 mg | ORAL_TABLET | Freq: Every day | ORAL | 3 refills | Status: DC
Start: 2016-11-18 — End: 2016-11-18

## 2016-11-18 MED ORDER — LORAZEPAM 2 MG/ML IJ SOLN
2 mg/mL | INTRAMUSCULAR | Status: DC | PRN
Start: 2016-11-18 — End: 2016-11-19

## 2016-11-18 MED ORDER — HYDROCODONE-ACETAMINOPHEN 7.5 MG-325 MG TAB
ORAL | Status: DC | PRN
Start: 2016-11-18 — End: 2016-11-23

## 2016-11-18 MED ORDER — SODIUM CHLORIDE 0.9 % IJ SYRG
INTRAMUSCULAR | Status: DC | PRN
Start: 2016-11-18 — End: 2016-11-23
  Administered 2016-11-20: 23:00:00 via INTRAVENOUS

## 2016-11-18 MED FILL — CARDENE 20 MG/200 ML(0.1 MG/ML) IN SOD CHLOR(ISO-OSM) INTRAVENOUS SOLN: 20 mg/0 mL (0.1 mg/mL) | INTRAVENOUS | Qty: 200

## 2016-11-18 MED FILL — DEXAMETHASONE SODIUM PHOSPHATE 4 MG/ML IJ SOLN: 4 mg/mL | INTRAMUSCULAR | Qty: 1

## 2016-11-18 MED FILL — PANTOPRAZOLE 40 MG IV SOLR: 40 mg | INTRAVENOUS | Qty: 40

## 2016-11-18 MED FILL — LEVETIRACETAM 500 MG/5 ML IV SOLN: 500 mg/5 mL | INTRAVENOUS | Qty: 10

## 2016-11-18 NOTE — ED Provider Notes (Addendum)
HPI Comments: ppatient with recent diagnosis of lung liver and brain cancer.  Possible spread from skin. Had a CyberKnife procedure on multiple brain lesions today at 1:00.  Finished at 145.  Around 155 while in the car driving home developed left leg twitching and shaking which slowly spread up to the abdomen and left arm.  She also had left leg and arm weakness.  EMS was eventually called out  And gave 1 mg of Ativan which resolved the twitching spasms that were present.  Patient remains with left leg and arm weakness.  No changes in speech. Patient has also had some confusion with difficulty following certain orders.    Patient is a 60 y.o. female presenting with seizures. The history is provided by the patient and a relative. No language interpreter was used.   Seizure    This is a new problem. Episode onset: [id number]. The problem has been gradually improving. There was 1 seizure. The most recent episode lasted 30 to 120 seconds. Associated symptoms include confusion and muscle weakness. Pertinent negatives include no headaches, no speech difficulty, no sore throat, no chest pain, no nausea, no vomiting and no diarrhea. Characteristics include rhythmic jerking. The episode was witnessed.  The seizures did not continue in the ED. The seizure(s) had left-sided, lower extremity and upper extremity focality. There has been no fever.  She reports confusion, muscle weakness.  She reports no chest pain, no diarrhea, no vomiting, no headaches, no sore throat, no speech difficulty. Medications administered prior to arrival include Ativan IV. Home seizure medications include: no seizure medications.       Past Medical History:   Diagnosis Date   ??? Cancer ([place])     melanoma - back   ??? Hypercholesterolemia    ??? Hypertension     managed with meds   ??? Nausea & vomiting    ??? Osteopenia        Past Surgical History:   Procedure Laterality Date   ??? HX COLONOSCOPY     ??? HX GYN      c-section x 5   ??? HX ORTHOPAEDIC Left 04/30/2016     knee   ??? HX OTHER SURGICAL      melanoma removed from back         Family History:   Problem Relation Age of Onset   ??? Hypertension Mother    ??? Elevated Lipids Mother    ??? Elevated Lipids Father    ??? Hypertension Father    ??? Arthritis-osteo Father      spine   ??? Heart Disease Father    ??? Breast Cancer Other    ??? Diabetes Sister        Social History     Social History   ??? Marital status: MARRIED     Spouse name: N/A   ??? Number of children: N/A   ??? Years of education: N/A     Occupational History   ??? Not on file.     Social History Main Topics   ??? Smoking status: Never Smoker   ??? Smokeless tobacco: Never Used   ??? Alcohol use No   ??? Drug use: Not on file   ??? Sexual activity: Not on file     Other Topics Concern   ??? Not on file     Social History Narrative         ALLERGIES: Adhesive tape-silicones    Review of Systems   Constitutional: Negative for chills and  fever.   HENT: Negative for rhinorrhea and sore throat.    Eyes: Negative for pain and redness.   Respiratory: Negative for chest tightness, shortness of breath and wheezing.    Cardiovascular: Negative for chest pain and leg swelling.   Gastrointestinal: Negative for abdominal pain, diarrhea, nausea and vomiting.   Genitourinary: Negative for dysuria and hematuria.   Musculoskeletal: Positive for gait problem. Negative for back pain, neck pain and neck stiffness.   Skin: Negative for color change and rash.   Neurological: Negative for facial asymmetry, speech difficulty, weakness, numbness and headaches.   Psychiatric/Behavioral: Positive for confusion.       Vitals:    11/18/16 1641   BP: 141/84   Pulse: 80   Resp: 18   Temp: 98.4 ??F (36.9 ??C)   SpO2: (!) 88%   Weight: 63.5 kg (140 lb)   Height: 5\' 7"  (1.702 m)            Physical Exam   Constitutional: She is oriented to person, place, and time. She appears well-developed and well-nourished.   HENT:   Head: Normocephalic and atraumatic.   Eyes: Conjunctivae and EOM are normal. Pupils are equal, round, and  reactive to light.   Neck: Normal range of motion. Neck supple.   Cardiovascular: Normal rate and regular rhythm.    No murmur heard.  Pulmonary/Chest: Effort normal and breath sounds normal. She has no wheezes.   Abdominal: Soft. Bowel sounds are normal. There is no tenderness.   Musculoskeletal: Normal range of motion. She exhibits no edema.   Neurological: She is alert and oriented to person, place, and time. No cranial nerve deficit. She exhibits abnormal muscle tone (LuE and LLE weak. ). Coordination (finger to nose off bilaterally worse on left. ) abnormal.   Sensation decreased on left.      Skin: Skin is warm and dry.   Nursing note and vitals reviewed.       MDM  Number of Diagnoses or Management Options  Cerebral brain hemorrhage [facility]):   Metabolic brain disease:   Diagnosis management comments: Stroke like symptoms post cyberknife. Hemorrhagic metastatic disease on CT. Spoke with dr. Chancy Milroy and no aspirin or heparin. Wants hospitalist to admit and they take over in AM. Discussed with Dr. Monia Pouch with neurosurgery and medical management at this time. Blood pressure in the 140's right now.     6:56 PM  Hospitalist saw pt and they are wondering about surgical options. He explained that the neurosurgeon states this is non surgical. They are having a difficult time understaning how severe and grave this is and that pt may be a hospice candidate also mentioned by oncology Dr. Chancy Milroy. They requested possible transfer to [place] due to neurology in house there. Called neurosurgery back to reconfirm his statements earlier and will talk with pt.     8:16 PM  Discussed with family again about the situation and nonsurgical candidate.  They are in agreement and wish to stay here for admission.       Amount and/or Complexity of Data Reviewed  Clinical lab tests: ordered and reviewed  Tests in the radiology section of CPT??: ordered and reviewed  Tests in the medicine section of CPT??: ordered and reviewed    Patient Progress   Patient progress: stable    ED Course       Procedures      EKG: normal sinus rhythm, nonspecific ST and T waves changes. Rate 67.        ??  CT HEAD WO CONT (Final result) Result time: 11/18/16 17:07:02   ?? Final result by Ian Malkin., MD (11/18/16 17:07:02)   ?? Impression:   ?? IMPRESSION:    1. Multiple hemorrhagic supratentorial and infratentorial metastases. ??    2. 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    The findings were called to Dr. Delora Fuel on 11/18/2016 at 5:05 PM by Dr. Velta Addison.   [phone]       ?? Narrative:   ?? CT HEAD WITHOUT CONTRAST 11/18/2016    HISTORY: Seizure-like activity. Melanoma. CyberKnife therapy at outside facility    TECHNIQUE: Noncontrast axial images were obtained through the brain    COMPARISON: MRI brain October 30, 2016    FINDINGS: There are multiple intra-axial lesions throughout the supratentorial  and infratentorial brain, many of which contain hemorrhage. Findings suggest  hemorrhagic metastases. A representative metastasis in the superior right  parietal lobe measures 2.7 cm in diameter (image 22). There is edema and local  mass effect associated with these lesions. There is no hydrocephalus or shift of  the midline structures. The lesion in the right cerebellar hemisphere measures  3.5 cm in diameter. There is a lesion in the left orbit suggestive of a  retrobulbar metastasis.     ??            Results Include:    Recent Results (from the past 24 hour(s))   GLUCOSE, POC, BEDSIDE    Collection Time: 11/18/16  5:13 PM   Result Value Ref Range    Glucose (POC) 147 (H) 65 - 100 MG/DL   POC PT/INR    Collection Time: 11/18/16  5:13 PM   Result Value Ref Range    Prothrombin time (POC) 11.3 9.6 - 11.6 SECS    INR (POC) 0.9 0.9 - 1.2     CBC WITH AUTOMATED DIFF    Collection Time: 11/18/16  5:14 PM   Result Value Ref Range    WBC 13.0 (H) 4.3 - 11.1 K/uL    RBC 4.94 4.05 - 5.25 M/uL    HGB 14.8 11.7 - 15.4 g/dL    HCT 41.3 35.8 - 46.3 %    MCV 83.6 79.6 - 97.8 FL     MCH 30.0 26.1 - 32.9 PG    MCHC 35.8 (H) 31.4 - 35.0 g/dL    RDW 14.8 (H) 11.9 - 14.6 %    PLATELET 190 150 - 450 K/uL    MPV 8.8 (L) 10.8 - 14.1 FL    DF AUTOMATED      NEUTROPHILS 93 (H) 43 - 78 %    LYMPHOCYTES 2 (L) 13 - 44 %    MONOCYTES 5 4.0 - 12.0 %    EOSINOPHILS 0 (L) 0.5 - 7.8 %    BASOPHILS 0 0.0 - 2.0 %    IMMATURE GRANULOCYTES 0 0.0 - 5.0 %    ABS. NEUTROPHILS 12.0 (H) 1.7 - 8.2 K/UL    ABS. LYMPHOCYTES 0.3 (L) 0.5 - 4.6 K/UL    ABS. MONOCYTES 0.6 0.1 - 1.3 K/UL    ABS. EOSINOPHILS 0.0 0.0 - 0.8 K/UL    ABS. BASOPHILS 0.0 0.0 - 0.2 K/UL    ABS. IMM. GRANS. 0.1 0.0 - 0.5 K/UL

## 2016-11-18 NOTE — Progress Notes (Signed)
Spoke with pts husband to discuss not starting patient on clinical trial and instead getting started on Tafinlar and Mekinist.  Will start approval and shipment of this medication ASAP.

## 2016-11-18 NOTE — ED Triage Notes (Signed)
Pt arrives via [facility] complaining of possible seizure like activity.  Per EMS patient received a cyber knife procedure at [facility] went home and started to have tremors in her left leg.  Pt had generalized weakness, arm weakness, especially in her left arm.  Pt was given 1 mg of ativan IV from EMS.  Pt currently has no feeling in her left leg. Alert and oriented, drowsy in triage.

## 2016-11-18 NOTE — H&P (Signed)
HOSPITALIST H&P/CONSULT  NAME:  Katelyn [name]   Age:  60 y.o.  DOB:   February 09, 1956   MRN:   [id number]  PCP: Dianna Rossetti, MD  Consulting MD:  Treatment Team: Attending Provider: Mellody Life, MD; Primary Nurse: Martinique A Betcher  HPI:     60 yo Pleasant and unfortunate Lady with PMH of Metastatic Melanoma follows up with Dr. Jenetta Loges presented to the ER with cc of Left leg spasms and seizure like activity. Pt had cyber knife Rx today at 1pm for metastatic brain mets. She felt at few tremors while getting the treatment but was able to finish the treatment. She left for home and while getting in car husband noticed she had decreased strength on left side. She then had witnessed tremors and spasms of left leg that looked like convulsions. She was brought in to the ER and CT head was done that showed Multiple hemorrhagic supratentorial and infratentorial metastases with Intraorbital suspected metastasis. She was seen by teleneuro and ER discussed the case with Neurosurgery on call and Oncologist on call who recommended medical management. She was Given IV ativan and her symptoms resolved. Cardene gtt ordered in the ER and hospitalist asked to assess and admit pt.     Pt states she feels a bit better, no more tremors or seizure like activity noted. Denies headache, visual changes, speech difficulty, limb paralysis, facial numbness or weakness. No nausea or vomiting reported. Per husband and daughters, she is improving slowly. Husband concerned if she can get the right kind of expertise and treatment here at [facility]. He asked about other options and I told him [facility] has neurologist on staff which we do not have. He would like to speak with neurosurgeon and then make a decision regarding staying here or transfer over to [facility].      10 point ROS done and is negative except as noted in HPI.  Past Medical History:   Diagnosis Date   ??? Cancer ([place])     melanoma - back   ??? Hypercholesterolemia    ??? Hypertension      managed with meds   ??? Nausea & vomiting    ??? Osteopenia       Past Surgical History:   Procedure Laterality Date   ??? HX COLONOSCOPY     ??? HX GYN      c-section x 5   ??? HX ORTHOPAEDIC Left 04/30/2016    knee   ??? HX OTHER SURGICAL      melanoma removed from back      Prior to Admission Medications   Prescriptions Last Dose Informant Patient Reported? Taking?   TURMERIC ROOT EXTRACT PO   Yes No   Sig: Take  by mouth daily.   amLODIPine (NORVASC) 10 mg tablet   No No   Sig: Take 1 Tab by mouth nightly.   aspirin delayed-release 81 mg tablet   Yes No   Sig: Take  by mouth nightly. Indications: prevention of transient ischemic attack   calcium-cholecalciferol, d3, (CALCIUM 600 + D) 600-125 mg-unit tab   Yes No   Sig: Take  by mouth three (3) times daily.   cetirizine (ZYRTEC) 10 mg tablet   Yes No   Sig: Take  by mouth.   dabrafenib (TAFINLAR) 75 mg cap   No No   Sig: Take 2 Caps by mouth two (2) times a day.   dexamethasone (DECADRON) 4 mg tablet   No No  Sig: Take 1 Tab by mouth two (2) times daily (with meals).   ferrous sulfate (IRON) 325 mg (65 mg iron) tablet   Yes No   Sig: Take  by mouth Daily (before breakfast).   fluticasone (FLONASE) 50 mcg/actuation nasal spray   Yes No   Sig: 2 Sprays by Both Nostrils route nightly.   losartan-hydroCHLOROthiazide (HYZAAR) 100-25 mg per tablet   No No   Sig: Take 1 Tab by mouth daily.   multivitamin (ONE A DAY) tablet   Yes No   Sig: Take 1 Tab by mouth daily.   simvastatin (ZOCOR) 20 mg tablet   No No   Sig: Take 1 Tab by mouth nightly.   trametinib (MEKINIST) 2 mg tab   No No   Sig: Take 1 Tab by mouth daily.      Facility-Administered Medications: None     Home meds reconciled.  Allergies   Allergen Reactions   ??? Adhesive Tape-Silicones Rash      Social History   Substance Use Topics   ??? Smoking status: Never Smoker   ??? Smokeless tobacco: Never Used   ??? Alcohol use No      Family History   Problem Relation Age of Onset   ??? Hypertension Mother     ??? Elevated Lipids Mother    ??? Elevated Lipids Father    ??? Hypertension Father    ??? Arthritis-osteo Father      spine   ??? Heart Disease Father    ??? Breast Cancer Other    ??? Diabetes Sister       Immunization History   Administered Date(s) Administered   ??? TB Skin Test (PPD) Intradermal 10/14/2016     Objective:     Visit Vitals   ??? BP (!) 147/100   ??? Pulse 79   ??? Temp 98.4 ??F (36.9 ??C)   ??? Resp 18   ??? Ht 5\' 7"  (1.702 m)   ??? Wt 63.5 kg (140 lb)   ??? SpO2 95%   ??? BMI 21.93 kg/m2      Temp (24hrs), Avg:98.4 ??F (36.9 ??C), Min:98.4 ??F (36.9 ??C), Max:98.4 ??F (36.9 ??C)    Oxygen Therapy  O2 Sat (%): 95 % (11/18/16 1747)  Pulse via Oximetry: 75 beats per minute (11/18/16 1747)  O2 Device: Room air (11/18/16 1715)    Physical Exam:  General:    Alert, cooperative, no distress, anxious  Head:   NCAT. No obvious deformity  Nose:  Nares normal. No drainage  Lungs:   CTABL.   No wheezing/rhonchi/rales  Heart:   RRR.  No m/r/g.  Abdomen:   S/nt/nd.  Bowel sounds normal.   Extremities: No cyanosis.   Skin:     No rashes or lesions. Not Jaundiced  Neurologic: Moves all extremities. Cr nerves II-XII intact. Mild left sided weakness, no tremors noted.        Data Review:   Recent Results (from the past 24 hour(s))   PTT    Collection Time: 11/18/16  5:12 PM   Result Value Ref Range    aPTT 22.0 (L) 23.5 - 31.7 SEC   GLUCOSE, POC, BEDSIDE    Collection Time: 11/18/16  5:13 PM   Result Value Ref Range    Glucose (POC) 147 (H) 65 - 100 MG/DL   POC PT/INR    Collection Time: 11/18/16  5:13 PM   Result Value Ref Range    Prothrombin time (POC) 11.3 9.6 - 11.6 SECS  INR (POC) 0.9 0.9 - 1.2     CBC WITH AUTOMATED DIFF    Collection Time: 11/18/16  5:14 PM   Result Value Ref Range    WBC 13.0 (H) 4.3 - 11.1 K/uL    RBC 4.94 4.05 - 5.25 M/uL    HGB 14.8 11.7 - 15.4 g/dL    HCT 41.3 35.8 - 46.3 %    MCV 83.6 79.6 - 97.8 FL    MCH 30.0 26.1 - 32.9 PG    MCHC 35.8 (H) 31.4 - 35.0 g/dL    RDW 14.8 (H) 11.9 - 14.6 %     PLATELET 190 150 - 450 K/uL    MPV 8.8 (L) 10.8 - 14.1 FL    DF AUTOMATED      NEUTROPHILS 93 (H) 43 - 78 %    LYMPHOCYTES 2 (L) 13 - 44 %    MONOCYTES 5 4.0 - 12.0 %    EOSINOPHILS 0 (L) 0.5 - 7.8 %    BASOPHILS 0 0.0 - 2.0 %    IMMATURE GRANULOCYTES 0 0.0 - 5.0 %    ABS. NEUTROPHILS 12.0 (H) 1.7 - 8.2 K/UL    ABS. LYMPHOCYTES 0.3 (L) 0.5 - 4.6 K/UL    ABS. MONOCYTES 0.6 0.1 - 1.3 K/UL    ABS. EOSINOPHILS 0.0 0.0 - 0.8 K/UL    ABS. BASOPHILS 0.0 0.0 - 0.2 K/UL    ABS. IMM. GRANS. 0.1 0.0 - 0.5 K/UL   METABOLIC PANEL, BASIC    Collection Time: 11/18/16  5:14 PM   Result Value Ref Range    Sodium 136 136 - 145 mmol/L    Potassium 3.4 (L) 3.5 - 5.1 mmol/L    Chloride 95 (L) 98 - 107 mmol/L    CO2 30 21 - 32 mmol/L    Anion gap 11 7 - 16 mmol/L    Glucose 144 (H) 65 - 100 mg/dL    BUN 23 8 - 23 MG/DL    Creatinine 0.76 0.6 - 1.0 MG/DL    GFR est AA >60 >60 ml/min/1.31m2    GFR est non-AA >60 >60 ml/min/1.6m2    Calcium 8.2 (L) 8.3 - 10.4 MG/DL     Imaging /Procedures /Studies:  I personally reviewed all labs, imaging, and other studies this admission:  CXR Results  (Last 48 hours)               11/18/16 1805  XR CHEST PORT Final result    Impression:  IMPRESSION: Minimal left lower lobe infiltrate/atelectasis           Narrative:  Chest X-ray       INDICATION:   Altered mental status       A portable AP view of the chest was obtained.       FINDINGS: There is minimal left lower lobe infiltrate/atelectasis.  The right   lung is clear.  The heart size is normal.  The bony thorax is intact.                 CT Results  (Last 48 hours)               11/18/16 1700  CT HEAD WO CONT Final result    Impression:  IMPRESSION:       1. Multiple hemorrhagic supratentorial and infratentorial metastases.         2. 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       The findings were called to Dr. Delora Fuel on  11/18/2016 at 5:05 PM by Dr. Velta Addison.    789               Narrative:  CT HEAD WITHOUT CONTRAST 11/18/2016        HISTORY: Seizure-like activity. Melanoma. CyberKnife therapy at outside facility       TECHNIQUE: Noncontrast axial images were obtained through the brain       COMPARISON: MRI brain October 30, 2016       FINDINGS: There are multiple intra-axial lesions throughout the supratentorial   and infratentorial brain, many of which contain hemorrhage. Findings suggest   hemorrhagic metastases. A representative metastasis in the superior right   parietal lobe measures 2.7 cm in diameter (image 22). There is edema and local   mass effect associated with these lesions. There is no hydrocephalus or shift of   the midline structures. The lesion in the right cerebellar hemisphere measures   3.5 cm in diameter. There is a lesion in the left orbit suggestive of a   retrobulbar metastasis.                 Assessment and Plan:     Active Hospital Problems    Diagnosis Date Noted   ??? 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 Admit to ICU bed inpatient  ?? Start on cardene gtt, Goal SBP <140, monitor BP closely, prn labetalol, resume home BP meds  ?? Frequent Neuro checks, monitor for seizures. STAT Ct head if she has headache or change in mental status.  ?? Start Keppra 1g Bid, get EEG in am, seizure precautions  ?? Start Decadron 4mg  q6h and start Protonix IV  ?? Resume pertinent home meds  ?? d/w Dr. Delora Fuel that neurosurgery should see pt and then family will decide if they want to be transferred in which case if pt is still in ER, he will call [facility] and transfer pt.  ?? Care plan d/w ER Nurse, pt and family.     FEN:  Regular diet  DVT ppx:  SCds  Code status:  FULL code  Estimated LOS:  3-4 days  Risk assessment:  Very high risk given intracranial bleed, suspected seizures and weakness of left side.    Time spent in the admission process was 65 mins  Time spent in providing Critical care was 40 mins.    Signed By: Clement Sayres, MD     November 18, 2016

## 2016-11-18 NOTE — Progress Notes (Signed)
[id number] patient caregiver number.  Patient having leg cramps, called back to [facility].  [facility] would like the patient to have labs drawn here.  When spoke with family member, patient leg cramps have subsided however she is now having abdomen cramping.  Explained we will have labs drawn to assess for MG or potassium problems.

## 2016-11-18 NOTE — Progress Notes (Cosign Needed)
Primary Nurse Hurshel Party and [name], RN performed a dual skin assessment on this patient. Patient has a few light pink scars on her back. No other impairment noted. No skin breakdown. Allevyn applied to sacrum. Will continue to monitor.

## 2016-11-18 NOTE — Progress Notes (Signed)
Today, Katelyn [name] was at [facility] receiving cyber-knife for her 7 brain mets from melanoma.  It was reported that during the procedure she began having left leg spasms and was sent home after the procedure.  When at home, her husband, called Lavina Hamman to report worsening spasms along with abdominal spasms and decreased use of her left arm.  They were told to come to our cancer center for labwork to rule our any electrolyte imbalances.  On arrival, she was unable to get out of the vehicle and was having spasms that from afar appeared to be seizure activity.  A rapid response was called (3:12pm) and she was brought into the building to a room in the the radiation department.  She denied any pain, but stated her left leg was tired and bothersome due to the constant spasms.  She remained alert and oriented x4, vitals were stable.  She did feel short of breath therefore O2 via NC was initiated.  On exam, she was unable to fully operate her left arm.  She had intact sensation, but was unable to move the arm or squeeze a hand on command.  EMS was called and she was sent to ED for further neuro evaluation.  Husband confirms that she has been taking her decadron as prescribed.      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

## 2016-11-18 NOTE — Progress Notes (Signed)
TRANSFER - IN REPORT:    Verbal report received from ER nurse on Audree Bane  being received from ED for routine progression of care      Report consisted of patient???s Situation, Background, Assessment and   Recommendations(SBAR).     Information from the following report(s) SBAR, Kardex, ED Summary, Intake/Output, MAR, Accordion, Recent Results, Med Rec Status and Cardiac Rhythm NSR was reviewed with the receiving nurse.    Opportunity for questions and clarification was provided.      Assessment completed upon patient???s arrival to unit and care assumed.

## 2016-11-19 ENCOUNTER — Inpatient Hospital Stay: Admit: 2016-11-19 | Payer: BLUE CROSS/BLUE SHIELD | Primary: Family Medicine

## 2016-11-19 LAB — METABOLIC PANEL, BASIC
Anion gap: 10 mmol/L (ref 7–16)
BUN: 25 MG/DL — ABNORMAL HIGH (ref 8–23)
CO2: 26 mmol/L (ref 21–32)
Calcium: 8 MG/DL — ABNORMAL LOW (ref 8.3–10.4)
Chloride: 102 mmol/L (ref 98–107)
Creatinine: 0.73 MG/DL (ref 0.6–1.0)
GFR est AA: >60 mL/min/{1.73_m2} (ref >60)
GFR est non-AA: >60 mL/min/{1.73_m2} (ref >60)
Glucose: 154 mg/dL — ABNORMAL HIGH (ref 65–100)
Potassium: 4.1 mmol/L (ref 3.5–5.1)
Sodium: 138 mmol/L (ref 136–145)

## 2016-11-19 LAB — EKG, 12 LEAD, INITIAL
Atrial Rate: 67 {beats}/min
Calculated P Axis: 54 degrees
Calculated R Axis: 74 degrees
Calculated T Axis: 74 degrees
P-R Interval: 164 ms
Q-T Interval: 398 ms
QRS Duration: 86 ms
QTC Calculation (Bezet): 420 ms
Ventricular Rate: 67 {beats}/min

## 2016-11-19 LAB — CBC W/O DIFF
HCT: 41.8 % (ref 35.8–46.3)
HGB: 13.9 g/dL (ref 11.7–15.4)
MCH: 29.1 PG (ref 26.1–32.9)
MCHC: 33.3 g/dL (ref 31.4–35.0)
MCV: 87.4 FL (ref 79.6–97.8)
MPV: 9 FL — ABNORMAL LOW (ref 10.8–14.1)
PLATELET: 192 10*3/uL (ref 150–450)
RBC: 4.78 M/uL (ref 4.05–5.25)
RDW: 15.3 % — ABNORMAL HIGH (ref 11.9–14.6)
WBC: 12.1 10*3/uL — ABNORMAL HIGH (ref 4.3–11.1)

## 2016-11-19 LAB — EKG 12-LEAD
Atrial Rate: 67 {beats}/min
P Axis: 54 degrees
P-R Interval: 164 ms
Q-T Interval: 398 ms
QRS Duration: 86 ms
QTc Calculation (Bazett): 420 ms
R Axis: 74 degrees
T Axis: 74 degrees
Ventricular Rate: 67 {beats}/min

## 2016-11-19 MED ORDER — LORAZEPAM 2 MG/ML IJ SOLN
2 mg/mL | INTRAMUSCULAR | Status: DC | PRN
Start: 2016-11-19 — End: 2016-11-23
  Administered 2016-11-23: 02:00:00 via INTRAVENOUS

## 2016-11-19 MED ORDER — POTASSIUM CHLORIDE 2 MEQ/ML IV SOLN
2 mEq/mL | Freq: Once | INTRAVENOUS | Status: AC
Start: 2016-11-19 — End: 2016-11-19
  Administered 2016-11-19: 05:00:00 via INTRAVENOUS

## 2016-11-19 MED ORDER — SODIUM CHLORIDE 0.9 % INJECTION
202 mg/2 mL | Freq: Two times a day (BID) | INTRAMUSCULAR | Status: DC
Start: 2016-11-19 — End: 2016-11-19
  Administered 2016-11-19: 14:00:00 via INTRAVENOUS

## 2016-11-19 MED ORDER — AMLODIPINE 10 MG TAB
10 mg | Freq: Every evening | ORAL | Status: DC
Start: 2016-11-19 — End: 2016-11-23
  Administered 2016-11-20 – 2016-11-23 (×5): via ORAL

## 2016-11-19 MED ORDER — FLUTICASONE 50 MCG/ACTUATION NASAL SPRAY, SUSP
50 mcg/actuation | Freq: Every evening | NASAL | Status: DC
Start: 2016-11-19 — End: 2016-11-19
  Administered 2016-11-19: 04:00:00 via NASAL

## 2016-11-19 MED ORDER — LEVETIRACETAM 500 MG/5 ML IV SOLN
5005 mg/5 mL | Freq: Two times a day (BID) | INTRAVENOUS | Status: DC
Start: 2016-11-19 — End: 2016-11-19

## 2016-11-19 MED ORDER — HYDRALAZINE 20 MG/ML IJ SOLN
20 mg/mL | Freq: Four times a day (QID) | INTRAMUSCULAR | Status: DC | PRN
Start: 2016-11-19 — End: 2016-11-23

## 2016-11-19 MED ORDER — SODIUM CHLORIDE 0.9 % IV
INTRAVENOUS | Status: DC
Start: 2016-11-19 — End: 2016-11-19
  Administered 2016-11-19: 04:00:00 via INTRAVENOUS

## 2016-11-19 MED ORDER — FLU VACCINE QV 2017-18 (36 MOS+)(PF) 60 MCG (15 MCG X 4)/0.5 ML IM SYRINGE
60 mcg (15 mcg x 4)/0.5 mL | INTRAMUSCULAR | Status: DC
Start: 2016-11-19 — End: 2016-11-23

## 2016-11-19 MED ORDER — DEXAMETHASONE SODIUM PHOSPHATE 4 MG/ML IJ SOLN
4 mg/mL | Freq: Four times a day (QID) | INTRAMUSCULAR | Status: DC
Start: 2016-11-19 — End: 2016-11-23
  Administered 2016-11-19 – 2016-11-23 (×18): via INTRAVENOUS

## 2016-11-19 MED ORDER — POTASSIUM CHLORIDE 20 MEQ/100 ML IV PIGGY BACK
20 mEq/100 mL | INTRAVENOUS | Status: DC
Start: 2016-11-19 — End: 2016-11-18

## 2016-11-19 MED ORDER — SODIUM CHLORIDE 0.9 % IV
25 mg/10 mL | INTRAVENOUS | Status: DC
Start: 2016-11-19 — End: 2016-11-20

## 2016-11-19 MED ORDER — LOSARTAN 50 MG TAB
50 mg | Freq: Every day | ORAL | Status: DC
Start: 2016-11-19 — End: 2016-11-23
  Administered 2016-11-19 – 2016-11-23 (×5): via ORAL

## 2016-11-19 MED ORDER — LEVETIRACETAM 500 MG TAB
500 mg | Freq: Two times a day (BID) | ORAL | Status: DC
Start: 2016-11-19 — End: 2016-11-23
  Administered 2016-11-19 – 2016-11-23 (×9): via ORAL

## 2016-11-19 MED ORDER — NUTRITIONAL PHARMACY SUPPORT ORDERS
Status: DC | PRN
Start: 2016-11-19 — End: 2016-11-23

## 2016-11-19 MED ORDER — .PHARMACY TO SUBSTITUTE PER PROTOCOL
Status: DC | PRN
Start: 2016-11-19 — End: 2016-11-18

## 2016-11-19 MED ORDER — TUBERCULIN PPD 5 UNIT/0.1 ML INTRADERMAL
5 tub. unit /0.1 mL | Freq: Once | INTRADERMAL | Status: AC
Start: 2016-11-19 — End: 2016-11-19

## 2016-11-19 MED FILL — FLUARIX QUAD 2017-2018 (PF) 60 MCG (15 MCG X 4)/0.5 ML IM SYRINGE: 60 mcg (15 mcg x 4)/0.5 mL | INTRAMUSCULAR | Qty: 0.5

## 2016-11-19 MED FILL — CARDENE 20 MG/200 ML(0.1 MG/ML) IN SOD CHLOR(ISO-OSM) INTRAVENOUS SOLN: 20 mg/0 mL (0.1 mg/mL) | INTRAVENOUS | Qty: 200

## 2016-11-19 MED FILL — NUTRITIONAL PHARMACY SUPPORT ORDERS: Qty: 1

## 2016-11-19 MED FILL — DEXAMETHASONE SODIUM PHOSPHATE 4 MG/ML IJ SOLN: 4 mg/mL | INTRAMUSCULAR | Qty: 1

## 2016-11-19 MED FILL — SODIUM CHLORIDE 0.9 % IV: INTRAVENOUS | Qty: 1000

## 2016-11-19 MED FILL — FLUTICASONE 50 MCG/ACTUATION NASAL SPRAY, SUSP: 50 mcg/actuation | NASAL | Qty: 16

## 2016-11-19 MED FILL — NICARDIPINE 2.5 MG/ML IV: 25 mg/10 mL | INTRAVENOUS | Qty: 10

## 2016-11-19 MED FILL — LEVETIRACETAM 500 MG/5 ML IV SOLN: 500 mg/5 mL | INTRAVENOUS | Qty: 10

## 2016-11-19 MED FILL — LOSARTAN 50 MG TAB: 50 mg | ORAL | Qty: 2

## 2016-11-19 MED FILL — POTASSIUM CHLORIDE 2 MEQ/ML IV SOLN: 2 mEq/mL | INTRAVENOUS | Qty: 20

## 2016-11-19 MED FILL — LEVETIRACETAM 500 MG TAB: 500 mg | ORAL | Qty: 1

## 2016-11-19 MED FILL — FAMOTIDINE (PF) 20 MG/2 ML IV: 20 mg/2 mL | INTRAVENOUS | Qty: 2

## 2016-11-19 NOTE — Consults (Signed)
Consults by Loney Loh, MD at 11/19/16 1219                Author: Loney Loh, MD  Service: Hematology and Oncology  Author Type: Physician       Filed: 11/19/16 1851  Date of Service: 11/19/16 1219  Status: Addendum          Editor: Loney Loh, MD (Physician)          Related Notes: Original Note by Flavia Shipper filed at 11/19/16 1327            Consult Orders        1. IP CONSULT TO ONCOLOGY [id number] ordered by Clement Sayres, MD at 11/18/16 [facility]          Inpatient Hematology / Oncology Consult Note      Reason for Consult:  Metastatic melanoma of brain [facility])   Referring Physician:  Clement Sayres, MD      History of Present Illness:   Ms. Deckelman is a  60 y.o. female admitted on 11/18/2016  . She is a patient of Dr. Jenetta Loges and Dr. Maceo Pro (radiation oncology) with metastatic melanoma. PET + for disease in liver, lung, lymph nodes. Liver biopsy done 10/22/16 confirmed metastatic disease.  PET also concerning for a lesion in the brain which was  further explored on brain MRI and identified 7 lesions.  She has started cyberknife to the brain lesions yesterday 11/29 with plans for daily treatment.  Prior to beginning treatment, she described what was thought to be muscle spasms.  She was sent to  the cancer center for labs and further evaluation.  On arrival, She presented with spasms and left sided weakness and what was presumed to be seizure activity. A rapid response was called and she was sent to the ER where she was admitted. CT scan of the  head showed hemorrhage from metastatic lesions but otherwise stable exam.  Her dexamethasone was increased back to 4 mg every 6 hours and she is on keppra 500mg  BID.  She was temporarily placed on a cardene drip.  She was evaluated by neurosurgery but  no surgical interventions were indicated.  We were consulted for further recommendations regarding her metastatic melanoma with hemorrhagic  brain mets.  Clinically is improved from yesterday.          Review of Systems:      Constitutional  Denies fever, chills, weight loss, appetite changes, fatigue, night sweats.        HEENT  Denies trauma, blurry vision, hearing loss, ear pain, nosebleeds, sore throat, neck pain         Skin  Denies lesions or rashes.     Lungs  Denies dyspnea, cough, sputum production or hemoptysis.     Cardiovascular  Denies chest pain, palpitations, or lower extremity edema.     Gastrointestinal  Denies nausea, vomiting, changes in bowel habits, bloody or black stools, abdominal pain.     GU  Denies dysuria, frequency or hesitancy of urination.     Neuro  Denies headaches, visual changes or ataxia. Denies dizziness, tingling, tremors, sensory change, speech change, focal weakness         Hematology  Denies easy bruising or  bleeding, denies gingival bleeding or epistaxis.     Endo  Denies heat/cold intolerance, denies diabetes or thyroid abnormalities.     MSK  Denies back pain, arthralgias, myalgias or frequent falls.        Psychiatric/Behavioral  Denies depression and substance abuse. The patient is not nervous/anxious.                Allergies        Allergen  Reactions         ?  Adhesive Tape-Silicones  Rash          Past Medical History:        Diagnosis  Date         ?  Cancer ([place])            melanoma - back         ?  Hypercholesterolemia       ?  Hypertension            managed with meds         ?  Nausea & vomiting           ?  Osteopenia            Past Surgical History:         Procedure  Laterality  Date          ?  HX COLONOSCOPY         ?  HX GYN              c-section x 5          ?  HX ORTHOPAEDIC  Left  04/30/2016          knee          ?  HX OTHER SURGICAL              melanoma removed from back          Family History         Problem  Relation  Age of Onset          ?  Hypertension  Mother       ?  Elevated Lipids  Mother       ?  Elevated Lipids  Father       ?  Hypertension  Father       ?  Arthritis-osteo   Father               spine          ?  Heart Disease  Father       ?  Breast Cancer  Other            ?  Diabetes  Sister            Social History          Social History         ?  Marital status:  MARRIED              Spouse name:  N/A         ?  Number of children:  N/A         ?  Years of education:  N/A          Occupational History        ?  Not on file.          Social History Main Topics         ?  Smoking status:  Never Smoker     ?  Smokeless tobacco:  Never Used     ?  Alcohol use  No     ?  Drug use:  Not on file         ?  Sexual activity:  Not on file           Other Topics  Concern        ?  Not on file          Social History Narrative          Current Facility-Administered Medications             Medication  Dose  Route  Frequency  Provider  Last Rate  Last Dose              ?  niCARdipine (CARDENE) 25 mg in 0.9% sodium chloride 250 mL infusion   0-15 mg/hr  IntraVENous  TITRATE  Ricarda FrameJulian S Dial III, MD     Stopped at 11/19/16 0200     ?  famotidine (PF) (PEPCID) 20 mg in sodium chloride 0.9 % 10 mL injection   20 mg  IntraVENous  Q12H  Neva SeatAdnan Aqeel, MD     20 mg at 11/19/16 [id number]     ?  levETIRAcetam (KEPPRA) 500 mg in 0.9% sodium chloride 100 mL IVPB   500 mg  IntraVENous  Q12H  Gloriann LoanStephen Clay Daniel, MD     Stopped at 11/19/16 0900     ?  sodium chloride (NS) flush 5-10 mL   5-10 mL  IntraVENous  Q8H  Neva SeatAdnan Aqeel, MD     Stopped at 11/19/16 0600     ?  sodium chloride (NS) flush 5-10 mL   5-10 mL  IntraVENous  PRN  Neva SeatAdnan Aqeel, MD           ?  labetalol (NORMODYNE;TRANDATE) injection 10 mg   10 mg  IntraVENous  Q10MIN PRN  Neva SeatAdnan Aqeel, MD           ?  LORazepam (ATIVAN) injection 2 mg   2 mg  IntraVENous  PRN  Neva SeatAdnan Aqeel, MD           ?  acetaminophen (TYLENOL) tablet 650 mg   650 mg  Oral  Q4H PRN  Neva SeatAdnan Aqeel, MD           ?  HYDROcodone-acetaminophen (NORCO) 7.5-325 mg per tablet 1 Tab   1 Tab  Oral  Q4H PRN  Neva SeatAdnan Aqeel, MD           ?  naloxone (NARCAN) injection 0.4 mg   0.4 mg  IntraVENous   PRN  Neva SeatAdnan Aqeel, MD           ?  amLODIPine (NORVASC) tablet 10 mg   10 mg  Oral  QHS  Neva SeatAdnan Aqeel, MD     Stopped at 11/18/16 2200     ?  fluticasone (FLONASE) 50 mcg/actuation nasal spray 2 Spray   2 Spray  Both Nostrils  QHS  Neva SeatAdnan Aqeel, MD     2 Spray at 11/18/16 2244     ?  dexamethasone (DECADRON) 4 mg/mL injection 4 mg   4 mg  IntraVENous  Q6H  Neva SeatAdnan Aqeel, MD     4 mg at 11/19/16 0542     ?  losartan/hydroCHLOROthiazide (HYZAAR) 100/25 mg     Oral  DAILY  Neva SeatAdnan Aqeel, MD                    ?  NUTRITIONAL SUPPORT ELECTROLYTE PRN ORDERS     Does Not Apply  PRN  Dewayne Hatch, MD                    ?  0.9% sodium chloride infusion   50 mL/hr  IntraVENous  CONTINUOUS  Dewayne Hatch, MD  50 mL/hr at 11/18/16 2243  50 mL/hr at 11/18/16 2243              ?  influenza vaccine 2017-18 (3 yrs+)(PF) (FLUZONE QUAD/FLUARIX QUAD) injection 0.5 mL   0.5 mL  IntraMUSCular  PRIOR TO DISCHARGE  Jose R Calton Golds, MD                 OBJECTIVE:   Patient Vitals for the past 8 hrs:            BP  Temp  Pulse  Resp  SpO2     11/19/16 1023  138/79  -  68  19  95 %     11/19/16 1003  (!) 149/102  -  74  30  95 %     11/19/16 0930  (!) 138/92  -  76  18  96 %     11/19/16 0900  (!) 145/93  -  (!) 58  17  97 %     11/19/16 0830  149/86  -  66  26  94 %     11/19/16 0815  143/85  -  64  26  96 %     11/19/16 0800  132/89  -  74  22  92 %     11/19/16 0745  154/88  -  72  29  93 %     11/19/16 0730  136/82  -  64  18  92 %     11/19/16 0715  134/83  -  62  16  94 %     11/19/16 0700  139/80  97.8 ??F (36.6 ??C)  60  15  95 %     11/19/16 0645  140/83  -  62  15  95 %     11/19/16 0634  133/82  -  65  18  94 %     11/19/16 0617  132/74  -  (!) 59  17  94 %     11/19/16 0602  135/78  -  65  17  94 %     11/19/16 0547  133/80  -  66  21  91 %     11/19/16 0532  140/79  -  73  (!) 37  92 %     11/19/16 0530  140/85  -  69  15  95 %        Temp (24hrs), Avg:98.2 ??F (36.8 ??C), Min:97.8 ??F (36.6 ??C), Max:98.5 ??F (36.9  ??C)      11/30 0701 - 11/30 1900   In: [id number] [P.O.:120; I.V.:445]   Out: -       Physical Exam:      Constitutional:  Well developed, well nourished female in no acute  distress, sitting comfortably in the hospital bed.         HEENT:  Normocephalic and atraumatic. Oropharynx is clear, mucous membranes are moist.  Neck supple       Lymph node     Deferred     Skin  Warm and dry.  No bruising and no rash noted.  No erythema.  No pallor.      Respiratory  Lungs are clear to auscultation bilaterally without wheezes, rales or rhonchi, normal air exchange without accessory muscle use.      CVS  Normal rate, regular rhythm and normal S1 and S2.  No murmurs, gallops, or rubs.        Abdomen  Soft, nontender and nondistended, normoactive bowel sounds.  No palpable mass.  No hepatosplenomegaly.        Neuro  Grossly nonfocal with no obvious sensory or motor deficits.     MSK  Normal range of motion in general.  No edema and no tenderness.     Psych  Appropriate mood and affect.            Labs:       Recent Results (from the past 24 hour(s))     EKG, 12 LEAD, INITIAL          Collection Time: 11/18/16  5:11 PM         Result  Value  Ref Range            Ventricular Rate  67  BPM       Atrial Rate  67  BPM       P-R Interval  164  ms       QRS Duration  86  ms       Q-T Interval  398  ms       QTC Calculation (Bezet)  420  ms       Calculated P Axis  54  degrees       Calculated R Axis  74  degrees       Calculated T Axis  74  degrees       Diagnosis                 !! AGE AND GENDER SPECIFIC ECG ANALYSIS !!   Normal sinus rhythm   Normal ECG   No previous ECGs available   Confirmed by [facility]  MD (UC), MATTHEW G ([id number]) on 11/18/2016 9:44:20 PM          PTT          Collection Time: 11/18/16  5:12 PM         Result  Value  Ref Range            aPTT  22.0 (L)  23.5 - 31.7 SEC       GLUCOSE, POC, BEDSIDE          Collection Time: 11/18/16  5:13 PM         Result  Value  Ref Range            Glucose (POC)  147 (H)  65 - 100  MG/DL       POC PT/INR          Collection Time: 11/18/16  5:13 PM         Result  Value  Ref Range            Prothrombin time (POC)  11.3  9.6 - 11.6 SECS       INR (POC)  0.9  0.9 - 1.2         CBC WITH AUTOMATED DIFF          Collection Time: 11/18/16  5:14 PM         Result  Value  Ref Range  WBC  13.0 (H)  4.3 - 11.1 K/uL       RBC  4.94  4.05 - 5.25 M/uL       HGB  14.8  11.7 - 15.4 g/dL       HCT  41.3  35.8 - 46.3 %       MCV  83.6  79.6 - 97.8 FL       MCH  30.0  26.1 - 32.9 PG       MCHC  35.8 (H)  31.4 - 35.0 g/dL       RDW  14.8 (H)  11.9 - 14.6 %       PLATELET  190  150 - 450 K/uL       MPV  8.8 (L)  10.8 - 14.1 FL       DF  AUTOMATED          NEUTROPHILS  93 (H)  43 - 78 %       LYMPHOCYTES  2 (L)  13 - 44 %       MONOCYTES  5  4.0 - 12.0 %       EOSINOPHILS  0 (L)  0.5 - 7.8 %       BASOPHILS  0  0.0 - 2.0 %       IMMATURE GRANULOCYTES  0  0.0 - 5.0 %       ABS. NEUTROPHILS  12.0 (H)  1.7 - 8.2 K/UL       ABS. LYMPHOCYTES  0.3 (L)  0.5 - 4.6 K/UL       ABS. MONOCYTES  0.6  0.1 - 1.3 K/UL       ABS. EOSINOPHILS  0.0  0.0 - 0.8 K/UL       ABS. BASOPHILS  0.0  0.0 - 0.2 K/UL       ABS. IMM. GRANS.  0.1  0.0 - 0.5 K/UL       METABOLIC PANEL, BASIC          Collection Time: 11/18/16  5:14 PM         Result  Value  Ref Range            Sodium  136  136 - 145 mmol/L       Potassium  3.4 (L)  3.5 - 5.1 mmol/L       Chloride  95 (L)  98 - 107 mmol/L       CO2  30  21 - 32 mmol/L       Anion gap  11  7 - 16 mmol/L       Glucose  144 (H)  65 - 100 mg/dL       BUN  23  8 - 23 MG/DL       Creatinine  0.76  0.6 - 1.0 MG/DL       GFR est AA  >60  >60 ml/min/1.79m2       GFR est non-AA  >60  >60 ml/min/1.15m2       Calcium  8.2 (L)  8.3 - 10.4 MG/DL       METABOLIC PANEL, BASIC          Collection Time: 11/19/16  6:10 AM         Result  Value  Ref Range            Sodium  138  136 - 145 mmol/L       Potassium  4.1  3.5 - 5.1 mmol/L       Chloride  102  98 - 107 mmol/L       CO2  26  21 - 32 mmol/L        Anion gap  10  7 - 16 mmol/L       Glucose  154 (H)  65 - 100 mg/dL       BUN  25 (H)  8 - 23 MG/DL       Creatinine  0.73  0.6 - 1.0 MG/DL       GFR est AA  >60  >60 ml/min/1.67m2       GFR est non-AA  >60  >60 ml/min/1.69m2       Calcium  8.0 (L)  8.3 - 10.4 MG/DL       CBC W/O DIFF          Collection Time: 11/19/16  6:10 AM         Result  Value  Ref Range            WBC  12.1 (H)  4.3 - 11.1 K/uL       RBC  4.78  4.05 - 5.25 M/uL       HGB  13.9  11.7 - 15.4 g/dL       HCT  41.8  35.8 - 46.3 %       MCV  87.4  79.6 - 97.8 FL       MCH  29.1  26.1 - 32.9 PG       MCHC  33.3  31.4 - 35.0 g/dL       RDW  15.3 (H)  11.9 - 14.6 %       PLATELET  192  150 - 450 K/uL            MPV  9.0 (L)  10.8 - 14.1 FL           Imaging:       CT HEAD WO CONT [id number]  Collected: 11/19/16 0537           ??  Order Status: Completed  Updated: 11/19/16 0541        ??  Narrative: ??     ??  Noncontrast CT of the brain.     COMPARISON: November 18, 2016    INDICATION: Follow-up intracranial hemorrhage    TECHNIQUE: Contiguous  axial images were obtained from the skull base through the  vertex without IV contrast. Radiation dose reduction techniques were used for  this study: ??Our CT scanners use one or all of the following: Automated exposure  control, adjustment  of the mA and/or kVp according to patient's size, iterative  reconstruction.    FINDINGS:    There are multiple infratentorial and supratentorial hemorrhagic masses (up to  4). No significant change compared to yesterday's study.  No midline shift or  hydrocephalus.    There is a stable mass in the left orbit. Included portions of the paranasal  sinuses and the mastoid air cells are aerated. Surrounding bones are stable.       ??  Impression: ??     ??  IMPRESSION:    Multiple hemorrhagic brain masses. No significant change compared to CT from  yesterday.         ??  XR CHEST PORT [id number]  Collected: 11/18/16 1815     ??  Order Status: Completed  Updated: 11/18/16 1818        ??  Narrative: ??     ??  Chest X-ray    INDICATION: ?? Altered mental status    A portable AP view of the chest was obtained.    FINDINGS: There is minimal  left lower lobe infiltrate/atelectasis. ??The right  lung is clear. ??The heart size is normal. ??The bony thorax is intact. ??       ??  Impression: ??     ??  IMPRESSION: Minimal left lower lobe infiltrate/atelectasis           ??  CT HEAD WO CONT [id number]  Collected: 11/18/16 1701     ??  Order Status: Completed  Updated: 11/18/16 1709        ??  Narrative: ??        ??  CT HEAD WITHOUT CONTRAST 11/18/2016    HISTORY: Seizure-like activity. Melanoma. CyberKnife therapy at outside facility    TECHNIQUE: Noncontrast  axial images were obtained through the brain    COMPARISON: MRI brain 11/29/2016    FINDINGS: There are multiple intra-axial lesions throughout the supratentorial  and infratentorial brain, many of which contain hemorrhage. Findings  suggest  hemorrhagic metastases. A representative metastasis in the superior right  parietal lobe measures 2.7 cm in diameter (image 22). There is edema and local  mass effect associated with these lesions. There is no hydrocephalus or shift  of  the midline structures. The lesion in the right cerebellar hemisphere measures  3.5 cm in diameter. There is a lesion in the left orbit suggestive of a  retrobulbar metastasis.          ??  Impression: ??     ??  IMPRESSION:    1. Multiple hemorrhagic supratentorial and infratentorial metastases. ??    2. 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     The findings were called to Dr. Delora Fuel on 11/18/2016 at 5:05 PM by Dr. Velta Addison.   [id number]           ??              ASSESSMENT:      Problem List   Date Reviewed:  11-29-16                Codes  Class  Noted             * (Principal)Metastatic melanoma of brain [facility]  ICD-10-CM: C79.31   ICD-9-CM: 198.3    11/18/2016                       Liver lesion  ICD-10-CM: K76.9   ICD-9-CM: 573.8    10/16/2016                       Osteopenia  ICD-10-CM: M85.80    ICD-9-CM: 733.90    08/18/2013                       HTN (hypertension) (Chronic)  ICD-10-CM: I10   ICD-9-CM: 401.9    08/18/2013                       Hyperlipidemia  ICD-10-CM: E78.5   ICD-9-CM: 272.4    08/18/2013                             RECOMMENDATIONS:   Metastatic melanoma, admitted with hemorrhagic brain mets, s/p 1st treatment with cyberknife on  11/29. Spoke with Dr. Maceo Pro, radiation oncologist, who was initially  planning for her second treatment today. Will defer and repeat brain MRI today. Feels the bleeding/seizure activity not secondary to the treatment given timeline. Plan to resume tomorrow if she remains stable.  Continue dexamethasone and keppra. Discussion  with Dr. Drema Halon regarding prognosis and long term survival. Family appreciative of care.       Suspect she is stable to transfer to floor soon but would anticipate hospitalization through the weekend. Follow up with Dr. Jenetta Loges at discharge       Lab studies and imaging studies (CT head) were personally reviewed.  Pertinent old records were reviewed from care everywhere.  Case discussed with Drs Maceo Pro and Quillian Quince. Thank you for allowing [place] to  participate in the care of Ms. Saputo . We will follow along               Flavia Shipper, NP    [facility]   [address] [place] [address]   Office : [phone]   Fax : [phone]             Attending Addendum:   I personally evaluated the patient with Marcelyn Ditty, N.P.,  and agree with the assessment, findings and plan as documented.  Appears stable, continue Dex and Keppra, she needs RT.                     Floyde Parkins, MD   [facility]   [facility]   [address] [place] [address]   Office : [phone]   Fax : [phone]

## 2016-11-19 NOTE — Procedures (Signed)
[place]       Name:  Katelyn [name], Katelyn [name]   MR#:  [id number]   DOB:  Mar 06, 1956   Account #:  [id number]   Date of Adm:  11/18/2016       EEG [id number]    PROCEDURE: Awake and asleep electroencephalogram.     ORDERING PHYSICIAN: Clement Sayres, MD    INDICATIONS: Seizure-like activities.     MEDICATION   1. Decadron.   2. Keppra.    TECHNIQUE:  An 18-channel EEG was performed in XLTek machine   with EKG monitoring. The international 10/20 electrode   placement system electrode placement system and standard montage   were used.    State of consciousness: Awake, drowsy, asleep.    Background EEG showed abundant drowsy periods, non-REM sleep   stage I and II, with normal potentials. Intermittent, brief   wakeful periods were noted with diffuse beta activities and   normal alpha rhythm, 8-9 Hz, symmetrical. No epileptiform   discharges were identified. No rhythmic activities.    Photic stimulation: Symmetric driving.    EKG 54/minute, regular.     INTERPRETATION: Electroencephalogram shows excessive drowsy and sleep   periods, otherwise this is a normal awake and asleep EEG. There   are no epileptiform discharges or lateralized slowing   activities. EKG monitoring shows bradycardia at 54 per minute.        Oneta Rack, MD      MZ / JB   D:  11/19/2016   17:01   T:  11/19/2016   22:31   Job #:  [id number]

## 2016-11-19 NOTE — Progress Notes (Signed)
Problem: Mobility Impaired (Adult and Pediatric)  Goal: *Acute Goals and Plan of Care (Insert Text)  Discharge Goals:  (1.)Ms. Seppi will move from supine to sit and sit to supine , scoot up and down and roll side to side with INDEPENDENT within 7 day(s).    (2.)Ms. Huinker will transfer from bed to chair and chair to bed with SUPERVISION using the least restrictive device within 7 day(s).    (3.)Ms. Jackel will ambulate with SUPERVISION for 500+ feet with the least restrictive device within 7 day(s).   (4.)Ms. Golik will navigate room and hallway with no cues required within 7 days as demonstration of improve safety awareness and executive decision making.   ________________________________________________________________________________________________      PHYSICAL THERAPY: Initial Assessment, Treatment Day: Day of Assessment, PM 11/19/2016  INPATIENT: Hospital Day: 2  Payor: BLUE CROSS / Plan: [place] / Product Type: PPO /      NAME/AGE/GENDER: MAHNIYA SABAS is a 60 y.o. female   PRIMARY DIAGNOSIS: 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[place]) 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[place]) 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[place])        ICD-10: Treatment Diagnosis:   ?? Generalized Muscle Weakness (M62.81)  ?? Difficulty in walking, Not elsewhere classified (R26.2)  ?? History of falling (Z91.81)   Precaution/Allergies:  Adhesive tape-silicones      ASSESSMENT:     Ms. Ferrare is sitting up in bedside chair upon contact and agreeable to PT evaluation this afternoon with three of her five children at bedside. Pt reports living with her husband in 2 story home with 3 steps to enter with 0 railing and 12 steps at interior with 1 railing. Pt reports independence with gait and ADLs at baseline and 1 recent fall. Pt presents with 5/5 gross R LE strength and 4-4+/5 L LE strength. Pt has impaired sensation L foot only. Pt is CGA-SBA with sit to stand to RW. Pt requires cues to grab  onto walker with L UE. Pt ambulated 200 ft in hallway with RW and CGA-SBA. No unsteadiness noted with gait, compensations noted secondary to L foot numbness/tingling. Pt does require frequent VC and TC for directions (trying to turn L when pt requested R and vice versa) and difficulty following multiple step commands (pt asking "what's next or now what?". Pt returned to room and seated in bedside chair. Pt requesting now to use bathroom. Pt stood from chair to [place] again with CGA-SBA. Pt ambulated around room having difficulty with walker management around objects in room. Pt seated on commode, voided, and performed toilet hygiene independently. Pt stood from commode again requiring VC to attend to L UE onto walker and ambulated back to bed. Pt returned to supine with SBA. Pt left supine with children at bedside. Pt maybe benefit from HHPT at discharge from acute setting. SESLEY DOLLISON will benefit from skilled PT (medically necessary) to address decreased balance, decreased functional tolerance, decreased cardiopulmonary endurance, and decreased safety with mobility affecting participation in basic ADLs and functional tasks.         This section established at most recent assessment   PROBLEM LIST (Impairments causing functional limitations):  1. Decreased Strength  2. Decreased ADL/Functional Activities  3. Decreased Transfer Abilities  4. Decreased Ambulation Ability/Technique  5. Decreased Balance  6. Decreased Activity Tolerance  7. Decreased Pacing Skills  8. Increased Fatigue  9. Decreased Independence with Home Exercise Program  10. Decreased Cognition   INTERVENTIONS PLANNED: (Benefits and precautions  of physical therapy have been discussed with the patient.)  1. Balance Exercise  2. Bed Mobility  3. Family Education  4. Gait Training  5. Home Exercise Program (HEP)  6. Neuromuscular Re-education/Strengthening  7. Therapeutic Activites  8. Therapeutic Exercise/Strengthening  9. Transfer Training   10. Group Therapy     TREATMENT PLAN: Frequency/Duration: 3 times a week for duration of hospital stay  Rehabilitation Potential For Stated Goals: Good     RECOMMENDED REHABILITATION/EQUIPMENT: (at time of discharge pending progress): Due to the probability of continued deficits (see above) this patient will likely need continued skilled physical therapy after discharge.  Equipment:   ? walker              HISTORY:   History of Present Injury/Illness (Reason for Referral):  See H&P below  31 yo Pleasant and unfortunate Lady with PMH of Metastatic Melanoma follows up with Dr. Jenetta Loges presented to the ER with cc of Left leg spasms and seizure like activity. Pt had cyber knife Rx today at 1pm for metastatic brain mets. She felt at few tremors while getting the treatment but was able to finish the treatment. She left for home and while getting in car husband noticed she had decreased strength on left side. She then had witnessed tremors and spasms of left leg that looked like convulsions. She was brought in to the ER and CT head was done that showed Multiple hemorrhagic supratentorial and infratentorial metastases with Intraorbital suspected metastasis. She was seen by teleneuro and ER discussed the case with Neurosurgery on call and Oncologist on call who recommended medical management. She was Given IV ativan and her symptoms resolved. Cardene gtt ordered in the ER and hospitalist asked to assess and admit pt.   ??  Pt states she feels a bit better, no more tremors or seizure like activity noted. Denies headache, visual changes, speech difficulty, limb paralysis, facial numbness or weakness. No nausea or vomiting reported. Per husband and daughters, she is improving slowly. Husband concerned if she can get the right kind of expertise and treatment here at [facility]. He asked about other options and I told him [facility] has neurologist on staff which we do not have. He would like to speak with neurosurgeon and then make a  decision regarding staying here or transfer over to [facility].    Past Medical History/Comorbidities:   Ms. Briscoe  has a past medical history of Cancer ([place]); Hypercholesterolemia; Hypertension; Nausea & vomiting; and Osteopenia. She also has no past medical history of Adverse effect of anesthesia; Difficult intubation; Malignant hyperthermia due to anesthesia; or Pseudocholinesterase deficiency.  Ms. Luebbers  has a past surgical history that includes colonoscopy; gyn; other surgical; and orthopaedic (Left, 04/30/2016).  Social History/Living Environment:   Home Environment: Private residence  # Steps to Enter: 3  Rails to Enter: No  One/Two Story Residence: Two Actuary of Interior Steps: 12  Interior Rails: Right  Living Alone: No  Support Systems: Copy, Child(ren), Family member(s)  Patient Expects to be Discharged to:: Private residence  Current DME Used/Available at Home: None  Tub or Shower Type: Shower  Prior Level of Function/Work/Activity:  Lives with husband, indep with gait and ADLs, 1 recent fall   Number of Personal Factors/Comorbidities that affect the Plan of Care: 3+: HIGH COMPLEXITY   EXAMINATION:   Most Recent Physical Functioning:   Gross Assessment:  AROM: Within functional limits  Strength: Within functional limits  Coordination: Generally decreased, functional  Sensation: Impaired (L foot)               Posture:     Balance:  Sitting: Intact;Without support  Standing: Impaired;With support Bed Mobility:  Sit to Supine: Stand-by asssistance  Wheelchair Mobility:     Transfers:  Sit to Stand: Contact guard assistance;Stand-by asssistance  Stand to Sit: Contact guard assistance;Stand-by asssistance  Gait:     Base of Support: Center of gravity altered  Speed/Cadence: Slow  Step Length: Left shortened;Right shortened  Gait Abnormalities: Decreased step clearance  Distance (ft): 200 Feet (ft)  Assistive Device: Walker, rolling   Ambulation - Level of Assistance: Contact guard assistance  Interventions: Safety awareness training;Tactile cues;Verbal cues;Visual/Demos;Manual cues      Body Structures Involved:  1. Nerves  2. Heart  3. Lungs  4. Bones  5. Muscles Body Functions Affected:  1. Mental  2. Sensory/Pain  3. Cardio  4. Respiratory  5. Neuromusculoskeletal  6. Movement Related Activities and Participation Affected:  1. Learning and Applying Knowledge  2. General Tasks and Demands  3. Mobility  4. Self Care  5. Domestic Life  6. Interpersonal Interactions and Relationships  7. Community, Social and [employer]   Number of elements that affect the Plan of Care: 4+: HIGH COMPLEXITY   CLINICAL PRESENTATION:   Presentation: Evolving clinical presentation with changing clinical characteristics: MODERATE COMPLEXITY   CLINICAL DECISION MAKING:   [place]??? ???6 Clicks???   Basic Mobility Inpatient Short Form  How much difficulty does the patient currently have... Unable A Lot A Little None   1.  Turning over in bed (including adjusting bedclothes, sheets and blankets)?    1    2    3    4    2.  Sitting down on and standing up from a chair with arms ( e.g., wheelchair, bedside commode, etc.)    1    2    3    4    3.  Moving from lying on back to sitting on the side of the bed?    1    2    3    4    How much help from another person does the patient currently need... Total A Lot A Little None   4.  Moving to and from a bed to a chair (including a wheelchair)?    1    2    3    4    5.  Need to walk in hospital room?    1    2    3    4    6.  Climbing 3-5 steps with a railing?    1    2    3    4    ?? 2007, [employer], under license to [employer]. All rights reserved      Score:  Initial: 18 Most Recent: X (Date: -- )    Interpretation of Tool:  Represents activities that are increasingly more difficult (i.e. Bed mobility, Transfers, Gait).   Score 24 23 22-20 19-15 14-10 9-7 6     Modifier CH CI CJ CK CL CM CN       ? Mobility - Walking and Moving Around:    [id number] - CURRENT STATUS: CK - 40%-59% impaired, limited or restricted   [id number] - GOAL STATUS: CJ - 20%-39% impaired, limited or restricted   [id number] - D/C STATUS:  ---------------To be determined---------------  Payor: BLUE CROSS /  Plan[place] / Product Type: PPO /      Medical Necessity:     ?? Patient is expected to demonstrate progress in strength, balance, coordination and functional technique to decrease assistance required with gait, transfers, and safety wtih functional activities.  Reason for Services/Other Comments:  ?? Patient continues to require skilled intervention due to decreased balance, decreased functional tolerance, decreased cardiopulmonary endurance, and decreased safety with mobility affecting participation in basic ADLs and functional tasks.   Use of outcome tool(s) and clinical judgement create a POC that gives a: Questionable prediction of patient's progress: MODERATE COMPLEXITY            TREATMENT:   (In addition to Assessment/Re-Assessment sessions the following treatments were rendered)   Pre-treatment Symptoms/Complaints:  Numbness L foot  Pain: Initial:   Pain Intensity 1: 0  Post Session:  0/10   In addition to evaluation:  Therapeutic Activity: (    8 minutes):  Therapeutic activities including Bed transfers, Chair transfers, Toilet transfers, Ambulation on level ground and cues for walker management, safety of transfers, and compensatory strategies to improve mobility, strength, balance and coordination.  Required minimal Safety awareness training;Tactile cues;Verbal cues;Visual/Demos;Manual cues to promote dynamic balance in standing, promote coordination of bilateral, upper extremity(s), lower extremity(s) and promote motor control of bilateral, upper extremity(s), lower extremity(s).       Braces/Orthotics/Lines/Etc:   ?? IV  ?? O2 Device: Room air  Treatment/Session Assessment:     ?? Response to Treatment:  Pt ambulated 200 ft in hallway with RW and CGA. Pt has poor safety awareness at times.  ?? Interdisciplinary Collaboration:   o Physical Therapist  o Registered Nurse  o Rehabilitation Attendant  ?? After treatment position/precautions:   o Supine in bed  o Bed/Chair-wheels locked  o Bed in low position  o Call light within reach  o RN notified  o Family at bedside   ?? Compliance with Program/Exercises: Will assess as treatment progresses.  ?? Recommendations/Intent for next treatment session:  "Next visit will focus on advancements to more challenging activities and reduction in assistance provided".  Total Treatment Duration:  PT Patient Time In/Time Out  Time In: 1352  Time Out: [place] Lejuan Botto

## 2016-11-19 NOTE — Progress Notes (Signed)
Bedside report received from [place], [place]. Assisted pt to bedside commode. Dual neuro assessment complete- deficit slight noted on left leg, pt able to answer all questions correctly- speech is clear, delayed response noted. No distress noted, family at bedside, will continue to monitor.

## 2016-11-19 NOTE — Progress Notes (Signed)
MRI not ready for pt. Will have MRI in am.

## 2016-11-19 NOTE — Progress Notes (Signed)
Spoke with Dr. Raul Del about neurosurgery consult. Dr. Raul Del to put in progress note in

## 2016-11-19 NOTE — Progress Notes (Signed)
LTG: Patient will tolerate least restrictive diet without overt signs or symptoms of airway compromise.   STG: Patient will tolerate regular and thin liquids without overt signs or symptoms of airway compromise.   STG: Patient will participate in modified barium swallow study as clinically indicated.   STG: Patient will participate in full speech-language/cognitive assessment    Speech language pathology: bedside swallow note: Initial Assessment    NAME/AGE/GENDER: Katelyn [name] is a 60 y.o. female  DATE: 11/19/2016  PRIMARY DIAGNOSIS: 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[place])       ICD-10: Treatment Diagnosis: R13.12 Oropharyngeal Dysphagia.    INTERDISCIPLINARY COLLABORATION: Registered Nurse and Physician  PRECAUTIONS/ALLERGIES: Adhesive tape-silicones ASSESSMENT:Based on the objective data described below, Katelyn [name] presents with consistent throat clear with all liquid trials. Patient reports increased throat clearing and weaker vocal quality since diagnosis. She denies dysphagia at baseline.    Patient impulsive with self-feeding during assessment. She was noted to knock over cup of pears when attempting to take multiple bites at once. Also talking with mouth full of foods, requiring prompts to swallow bolus to improve safety. Initially inconsistent throat clear on thin liquids via consecutive straw sips, but then occurring more consistently with all liquid trials (thin, nectar, honey, mixed consistency). Suspect patient's increased attention to throat clearing resulting in it occurring more frequently during assessment as throat clear sounded dry, no wet vocal quality, and no coughing. Patient also stating "See there it is again, but I'm not having any trouble". Question habitual throat clear vs. Airway compromise.   Recommend continue with regular consistency diet and thin liquids for now. ? Need for modified barium swallow study should throat clearing continue  to occur with liquid trials. Discussed recommendations with patient's family, who insist throat clear is habitual and not related to po intake. Also discussed recommendations with Dr. Lavella Hammock who is in agreement with plan  Family reports increased word finding difficulty yesterday. Patient states that she has been writing messages on social media, but daughter has requested that she stop as messages make no sense. She communicated at the conversational level with clinician without overt difficulty, but some comprehension deficits noted. Patient with poor awareness of language deficits or impulsivity. Will benefit from full speech-language/cognitive assessment to determine functional abilities.   Patient will benefit from skilled intervention to address the below impairments.  ????????This section established at most recent assessment??????????  PROBLEM LIST (Impairments causing functional limitations):  1. oropharyngeal dysphagia  REHABILITATION POTENTIAL FOR STATED GOALS: Guarded  PLAN OF CARE:   Patient will benefit from skilled intervention to address the following impairments.  RECOMMENDATIONS AND PLANNED INTERVENTIONS (Benefits and precautions of therapy have been discussed with the patient.):  ?? PO:  Regular  ?? Liquids:  regular thin  MEDICATIONS:  ?? With liquid  COMPENSATORY STRATEGIES/MODIFICATIONS INCLUDING:  ?? Small sips and bites  OTHER RECOMMENDATIONS (including follow up treatment recommendations):   ?? Patient education  RECOMMENDED DIET MODIFICATIONS DISCUSSED WITH:  ?? Medical Sub-Specialist  ?? Nursing  ?? Patient  FREQUENCY/DURATION: Continue to follow patient 3 times a week for duration of hospital stay to address above goals.RECOMMENDED REHABILITATION/EQUIPMENT: (at time of discharge pending progress): Due to the probability of continued deficits (see above) this patient will likely need continued skilled speech therapy after discharge.  SUBJECTIVE:   Alert, impulsive, poor awareness of deficits   History of Present Injury/Illness: Katelyn [name]  has a past medical history of Cancer ([place]); Hypercholesterolemia; Hypertension; Nausea & vomiting; and Osteopenia. She also has  no past medical history of Adverse effect of anesthesia; Difficult intubation; Malignant hyperthermia due to anesthesia; or Pseudocholinesterase deficiency..  She also  has a past surgical history that includes colonoscopy; gyn; other surgical; and orthopaedic (Left, 04/30/2016).   Present Symptoms: Throat clears with liquids   Pain Intensity 1: 0  Current Medications:   No current facility-administered medications on file prior to encounter.      Current Outpatient Prescriptions on File Prior to Encounter   Medication Sig Dispense Refill   ??? dabrafenib (TAFINLAR) 75 mg cap Take 2 Caps by mouth two (2) times a day. 120 Cap 3   ??? trametinib (MEKINIST) 2 mg tab Take 1 Tab by mouth daily. 30 Tab 3   ??? dexamethasone (DECADRON) 4 mg tablet Take 1 Tab by mouth two (2) times daily (with meals). 60 Tab 2   ??? simvastatin (ZOCOR) 20 mg tablet Take 1 Tab by mouth nightly. 90 Tab 3   ??? amLODIPine (NORVASC) 10 mg tablet Take 1 Tab by mouth nightly. 90 Tab 3   ??? losartan-hydroCHLOROthiazide (HYZAAR) 100-25 mg per tablet Take 1 Tab by mouth daily. 90 Tab 3   ??? multivitamin (ONE A DAY) tablet Take 1 Tab by mouth daily.     ??? TURMERIC ROOT EXTRACT PO Take  by mouth daily.     ??? ferrous sulfate (IRON) 325 mg (65 mg iron) tablet Take  by mouth Daily (before breakfast).     ??? fluticasone (FLONASE) 50 mcg/actuation nasal spray 2 Sprays by Both Nostrils route nightly.     ??? aspirin delayed-release 81 mg tablet Take  by mouth nightly. Indications: prevention of transient ischemic attack     ??? calcium-cholecalciferol, d3, (CALCIUM 600 + D) 600-125 mg-unit tab Take  by mouth three (3) times daily.     ??? cetirizine (ZYRTEC) 10 mg tablet Take  by mouth.       Current Dietary Status:  Regular/thin  ?   Social History/Home Situation:    Home Environment: Private residence   One/Two Story Residence: Two story  Living Alone: No  Support Systems: Child(ren), Family member(s)  Patient Expects to be Discharged to:: Private residence  Current DME Used/Available at Home: None  OBJECTIVE:   Respiratory Status:  Room air     CXR Results:Minimal left lower lobe infiltrate/atelectasis  Oral Motor Structure/Speech:  Oral-Motor Structure/Motor Speech  Labial: No impairment  Dentition: Natural  Oral Hygiene: Adequate  Lingual: No impairment    Cognitive and Communication Status:  Neurologic State: Alert  Orientation Level: Oriented X4  Cognition: Impulsive;Follows commands;Impaired decision making  Perception: Appears intact  Perseveration: No perseveration noted  Safety/Judgement: Awareness of environment    BEDSIDE SWALLOW EVALUATION  Oral Assessment:  Oral Assessment  Labial: No impairment  Dentition: Natural  Oral Hygiene: Adequate  Lingual: No impairment  P.O. Trials:  Patient Position: Upright in chair    The patient was given tsp-small bite amounts of the following:   Consistency Presented: Honey thick liquid;Mixed consistency;Thin liquid;Puree;Solid;Nectar thick liquid  How Presented: Self-fed/presented;Cup/sip;Spoon;Straw    ORAL PHASE:  Bolus Acceptance: No impairment  Bolus Formation/Control: No impairment  Propulsion: No impairment     Oral Residue: None    PHARYNGEAL PHASE:  Initiation of Swallow: No impairment  Laryngeal Elevation: Functional  Aspiration Signs/Symptoms: Clear throat  Vocal Quality: Hoarse     Effective Modifications: None     Pharyngeal Phase Characteristics: No impairment, issues, or problems     OTHER OBSERVATIONS:  Rate/bite size: WNL  Endurance:  Questionable   Comments:      Tool Used: Dysphagia Outcome and Severity Scale (DOSS)    Score Comments   Normal Diet   7 With no strategies or extra time needed   Functional Swallow   6 May have mild oral or pharyngeal delay       Mild Dysphagia     5 Which may require one diet consistency restricted (those who  demonstrate penetration which is entirely cleared on MBS would be included)   Mild-Moderate Dysphagia   4 With 1-2 diet consistencies restricted       Moderate Dysphagia   3 With 2 or more diet consistencies restricted       Moderately Severe Dysphagia   2 With partial PO strategies (trials with ST only)       Severe Dysphagia   1 With inability to tolerate any PO safely          Score:  Initial: 6 Most Recent: X (Date: -- )   Interpretation of Tool: The Dysphagia Outcome and Severity Scale (DOSS) is a simple, easy-to-use, 7-point scale developed to systematically rate the functional severity of dysphagia based on objective assessment and make recommendations for diet level, independence level, and type of nutrition.     Score 7 6 5 4 3 2 1    Modifier CH CI CJ CK CL CM CN   ? Swallowing:    [id number] - CURRENT STATUS: CI - 1%-19% impaired, limited or restricted   [id number] - GOAL STATUS:  CH - 0% impaired, limited or restricted   [id number] - D/C STATUS:  ---------------To be determined---------------  Payor: BLUE CROSS / [place] / Product Type: PPO /     TREATMENT:    (In addition to Assessment/Re-Assessment sessions the following treatments were rendered)  Assessment/Reassessment only, no treatment provided today  MODALITIES:                                                                    ORAL MOTOR  EXERCISES:                                                                                                                                                                      LARYNGEAL / PHARYNGEAL EXERCISES:  __________________________________________________________________________________________________  Safety:   After treatment position/precautions:  ?? Upright in Bed.  Progression/Medical Necessity:    ?? Patient is expected to demonstrate progress in swallow function, diet tolerance and swallow safety to decrease aspiration risk.  Compliance with Program/Exercises: Will assess as treatment progresses.   Reason for Continuation of Services/Other Comments:  ?? Patient continues to require skilled intervention due to oropharygneal dysphagia.  Recommendations/Intent for next treatment session: "Treatment next visit will focus on diet tolerance, speech -language/cognitive assessment".    Total Treatment Duration:  Time In: 1105  Time Out: 1127    Delight Stare, MSP, CCC-SLP, CBIS

## 2016-11-19 NOTE — Progress Notes (Signed)
Initial visit made to patient and a prayer was provided. A Chaplain card was left. Her husband and son were present. Patient shared the seriousness of her health condition.        [employer]

## 2016-11-19 NOTE — Progress Notes (Signed)
Hospitalist Progress Note     Admit Date:  11/18/2016  4:33 PM   Name:  Katelyn [name]   Age:  60 y.o.  DOB:  1956-01-22   MRN:  [id number]   PCP:  Dianna Rossetti, MD  Treatment Team: Attending Provider: Clement Sayres, MD; Consulting Provider: Loney Loh, MD    Subjective:   60 yo Pleasant and unfortunate Lady with PMH of Metastatic Melanoma follows up with Dr. Jenetta Loges presented to the ER with cc of left leg spasms and seizure like activity. Pt had cyber knife 11/29 at 1pm for metastatic brain mets. She felt at few tremors while getting the treatment but was able to finish the treatment. She left for home and while getting in car husband noticed she had decreased strength on left side. She then had witnessed tremors and spasms of left leg that looked like convulsions. She was brought in to the ER and CT head was done that showed Multiple hemorrhagic supratentorial and infratentorial metastases with Intraorbital suspected metastasis.  Oncology and neurosurg consulted; nonsurgical.  Was placed in ICU 24 hours per protocol on cardene gtt but quickly weaned off.      11/30 - pt reports still weak in left leg with numbness.  No other new symptoms.  No recurrence of convulsions or seizures.  No AMS or impaired consciousness.    Objective:     Patient Vitals for the past 24 hrs:   Temp Pulse Resp BP SpO2   11/19/16 1510 - 75 22 150/89 95 %   11/19/16 1348 - - - 128/77 96 %   11/19/16 1347 - 79 19 - 94 %   11/19/16 1333 - - - 127/78 94 %   11/19/16 1332 - 79 13 - 94 %   11/19/16 1318 - 76 21 127/72 93 %   11/19/16 1302 - 80 23 126/79 91 %   11/19/16 1248 - - - 137/77 -   11/19/16 1247 - 89 18 - -   11/19/16 1232 - - - 137/82 -   11/19/16 1218 98 ??F (36.7 ??C) 88 18 139/81 93 %   11/19/16 1215 - 76 17 - 95 %   11/19/16 1203 - 72 19 140/82 94 %   11/19/16 1148 - 77 21 135/78 92 %   11/19/16 1133 - 80 24 146/87 94 %   11/19/16 1117 - 79 22 137/84 92 %   11/19/16 1102 - - - (!) 133/92 93 %   11/19/16 1100 - 84 18 - 92 %    11/19/16 1047 - 89 16 (!) 146/93 96 %   11/19/16 1037 - 80 21 - 93 %   11/19/16 1032 - - - (!) 128/91 95 %   11/19/16 1023 - 68 19 138/79 95 %   11/19/16 1003 - 74 30 (!) 149/102 95 %   11/19/16 0930 - 76 18 (!) 138/92 96 %   11/19/16 0900 - (!) 58 17 (!) 145/93 97 %   11/19/16 0830 - 66 26 149/86 94 %   11/19/16 0815 - 64 26 143/85 96 %   11/19/16 0800 - 74 22 132/89 92 %   11/19/16 0745 - 72 29 154/88 93 %   11/19/16 0730 - 64 18 136/82 92 %   11/19/16 0715 - 62 16 134/83 94 %   11/19/16 0700 97.8 ??F (36.6 ??C) 60 15 139/80 95 %   11/19/16 0645 - 62 15 140/83 95 %   11/19/16 0634 -  65 18 133/82 94 %   11/19/16 0617 - (!) 59 17 132/74 94 %   11/19/16 0602 - 65 17 135/78 94 %   11/19/16 0547 - 66 21 133/80 91 %   11/19/16 0532 - 73 (!) 37 140/79 92 %   11/19/16 0530 - 69 15 140/85 95 %   11/19/16 0501 - (!) 58 16 140/83 95 %   11/19/16 0447 - 66 17 151/83 96 %   11/19/16 0431 - 66 17 138/84 96 %   11/19/16 0401 - 65 14 136/77 97 %   11/19/16 0346 - 65 12 125/81 95 %   11/19/16 0331 - 70 16 122/82 95 %   11/19/16 0316 - 79 15 128/84 96 %   11/19/16 0301 - (!) 59 16 125/76 92 %   11/19/16 0246 - (!) 56 15 125/77 93 %   11/19/16 0231 98.5 ??F (36.9 ??C) (!) 55 16 129/81 93 %   11/19/16 0216 - 63 18 134/83 93 %   11/19/16 0201 - 71 13 145/83 94 %   11/19/16 0146 - 79 13 105/70 96 %   11/19/16 0130 - (!) 57 16 118/72 95 %   11/19/16 0117 - (!) 55 17 104/68 96 %   11/19/16 0101 - 82 27 118/77 97 %   11/19/16 0046 - (!) 58 21 120/78 95 %   11/19/16 0031 - 60 30 119/75 94 %   11/19/16 0016 - 60 15 106/68 95 %   11/19/16 0003 - 63 16 99/66 -   11/18/16 2345 - 66 16 104/64 -   11/18/16 2332 - 67 15 105/67 -   11/18/16 2316 - 92 29 136/71 -   11/18/16 2301 - 82 16 119/78 94 %   11/18/16 2246 - 84 18 131/71 95 %   11/18/16 2231 - 80 (!) 53 120/77 94 %   11/18/16 2215 - 82 27 106/67 93 %   11/18/16 2207 98.1 ??F (36.7 ??C) 87 (!) 31 115/73 93 %   11/18/16 2101 - 61 15 107/65 93 %   11/18/16 2046 - 85 - 118/66 94 %    11/18/16 2030 - 89 - 127/74 (!) 89 %   11/18/16 2016 - (!) 101 - 117/72 93 %   11/18/16 2000 - 68 14 101/56 92 %   11/18/16 1946 - 70 - 107/57 91 %   11/18/16 1931 - 91 - 132/81 93 %   11/18/16 1916 - 95 - 127/77 91 %   11/18/16 1903 - 90 - 131/62 (!) 89 %   11/18/16 1846 - 77 - (!) 157/93 -   11/18/16 1826 - 76 - 147/90 93 %   11/18/16 1806 - 74 - 148/87 96 %   11/18/16 1747 - 79 18 (!) 147/100 95 %   11/18/16 1726 - 72 (!) 33 148/90 (!) 85 %   11/18/16 1715 - - - - (!) 88 %   11/18/16 1706 - 79 - (!) 147/94 94 %   11/18/16 1648 - 81 - 141/84 (!) 88 %   11/18/16 1647 - - - 139/80 -   11/18/16 1641 98.4 ??F (36.9 ??C) 80 18 141/84 (!) 88 %     Oxygen Therapy  O2 Sat (%): 95 % (11/19/16 1510)  Pulse via Oximetry: 76 beats per minute (11/19/16 1510)  O2 Device: Room air (11/19/16 0700)    Intake/Output Summary (Last 24 hours) at 11/19/16 1526  Last data filed at 11/19/16 1000  Gross per 24 hour   Intake           1186.6 ml   Output             1000 ml   Net            186.6 ml         General:    Well nourished.  Alert.    CV:   RRR.  No murmur, rub, or gallop.  Lungs:   CTAB.  No wheezing, rhonchi, or rales.  Abdomen:   Soft, nontender, nondistended.   Extremities: Warm and dry.  No cyanosis or edema.   Skin:     No rashes or jaundice.     Data Review:  I have reviewed all labs, meds, telemetry events, and studies from the last 24 hours.    Recent Results (from the past 24 hour(s))   EKG, 12 LEAD, INITIAL    Collection Time: 11/18/16  5:11 PM   Result Value Ref Range    Ventricular Rate 67 BPM    Atrial Rate 67 BPM    P-R Interval 164 ms    QRS Duration 86 ms    Q-T Interval 398 ms    QTC Calculation (Bezet) 420 ms    Calculated P Axis 54 degrees    Calculated R Axis 74 degrees    Calculated T Axis 74 degrees    Diagnosis       !! AGE AND GENDER SPECIFIC ECG ANALYSIS !!  Normal sinus rhythm  Normal ECG  No previous ECGs available  Confirmed by [facility]  MD (UC), MATTHEW G ([id number]) on 11/18/2016 9:44:20 PM     PTT     Collection Time: 11/18/16  5:12 PM   Result Value Ref Range    aPTT 22.0 (L) 23.5 - 31.7 SEC   GLUCOSE, POC, BEDSIDE    Collection Time: 11/18/16  5:13 PM   Result Value Ref Range    Glucose (POC) 147 (H) 65 - 100 MG/DL   POC PT/INR    Collection Time: 11/18/16  5:13 PM   Result Value Ref Range    Prothrombin time (POC) 11.3 9.6 - 11.6 SECS    INR (POC) 0.9 0.9 - 1.2     CBC WITH AUTOMATED DIFF    Collection Time: 11/18/16  5:14 PM   Result Value Ref Range    WBC 13.0 (H) 4.3 - 11.1 K/uL    RBC 4.94 4.05 - 5.25 M/uL    HGB 14.8 11.7 - 15.4 g/dL    HCT 41.3 35.8 - 46.3 %    MCV 83.6 79.6 - 97.8 FL    MCH 30.0 26.1 - 32.9 PG    MCHC 35.8 (H) 31.4 - 35.0 g/dL    RDW 14.8 (H) 11.9 - 14.6 %    PLATELET 190 150 - 450 K/uL    MPV 8.8 (L) 10.8 - 14.1 FL    DF AUTOMATED      NEUTROPHILS 93 (H) 43 - 78 %    LYMPHOCYTES 2 (L) 13 - 44 %    MONOCYTES 5 4.0 - 12.0 %    EOSINOPHILS 0 (L) 0.5 - 7.8 %    BASOPHILS 0 0.0 - 2.0 %    IMMATURE GRANULOCYTES 0 0.0 - 5.0 %    ABS. NEUTROPHILS 12.0 (H) 1.7 - 8.2 K/UL    ABS. LYMPHOCYTES 0.3 (L) 0.5 - 4.6 K/UL    ABS. MONOCYTES 0.6 0.1 - 1.3 K/UL  ABS. EOSINOPHILS 0.0 0.0 - 0.8 K/UL    ABS. BASOPHILS 0.0 0.0 - 0.2 K/UL    ABS. IMM. GRANS. 0.1 0.0 - 0.5 K/UL   METABOLIC PANEL, BASIC    Collection Time: 11/18/16  5:14 PM   Result Value Ref Range    Sodium 136 136 - 145 mmol/L    Potassium 3.4 (L) 3.5 - 5.1 mmol/L    Chloride 95 (L) 98 - 107 mmol/L    CO2 30 21 - 32 mmol/L    Anion gap 11 7 - 16 mmol/L    Glucose 144 (H) 65 - 100 mg/dL    BUN 23 8 - 23 MG/DL    Creatinine 0.76 0.6 - 1.0 MG/DL    GFR est AA >60 >60 ml/min/1.80m2    GFR est non-AA >60 >60 ml/min/1.95m2    Calcium 8.2 (L) 8.3 - [id number] MG/DL   METABOLIC PANEL, BASIC    Collection Time: 11/19/16  6:10 AM   Result Value Ref Range    Sodium 138 136 - 145 mmol/L    Potassium 4.1 3.5 - 5.1 mmol/L    Chloride 102 98 - 107 mmol/L    CO2 26 21 - 32 mmol/L    Anion gap 10 7 - 16 mmol/L    Glucose 154 (H) 65 - 100 mg/dL     BUN 25 (H) 8 - 23 MG/DL    Creatinine 0.73 0.6 - 1.0 MG/DL    GFR est AA >60 >60 ml/min/1.74m2    GFR est non-AA >60 >60 ml/min/1.86m2    Calcium 8.0 (L) 8.3 - 10.4 MG/DL   CBC W/O DIFF    Collection Time: 11/19/16  6:10 AM   Result Value Ref Range    WBC 12.1 (H) 4.3 - 11.1 K/uL    RBC 4.78 4.05 - 5.25 M/uL    HGB 13.9 11.7 - 15.4 g/dL    HCT 41.8 35.8 - 46.3 %    MCV 87.4 79.6 - 97.8 FL    MCH 29.1 26.1 - 32.9 PG    MCHC 33.3 31.4 - 35.0 g/dL    RDW 15.3 (H) 11.9 - 14.6 %    PLATELET 192 150 - 450 K/uL    MPV 9.0 (L) 10.8 - 14.1 FL        All Micro Results     None          Current Meds:  Current Facility-Administered Medications   Medication Dose Route Frequency   ??? niCARdipine (CARDENE) 25 mg in 0.9% sodium chloride 250 mL infusion  0-15 mg/hr IntraVENous TITRATE   ??? hydrALAZINE (APRESOLINE) 20 mg/mL injection 20 mg  20 mg IntraVENous Q6H PRN   ??? levETIRAcetam (KEPPRA) tablet 500 mg  500 mg Oral BID   ??? tuberculin injection 5 Units  5 Units IntraDERMal ONCE   ??? LORazepam (ATIVAN) injection 2 mg  2 mg IntraVENous Q4H PRN   ??? sodium chloride (NS) flush 5-10 mL  5-10 mL IntraVENous Q8H   ??? sodium chloride (NS) flush 5-10 mL  5-10 mL IntraVENous PRN   ??? acetaminophen (TYLENOL) tablet 650 mg  650 mg Oral Q4H PRN   ??? HYDROcodone-acetaminophen (NORCO) 7.5-325 mg per tablet 1 Tab  1 Tab Oral Q4H PRN   ??? naloxone (NARCAN) injection 0.4 mg  0.4 mg IntraVENous PRN   ??? amLODIPine (NORVASC) tablet 10 mg  10 mg Oral QHS   ??? dexamethasone (DECADRON) 4 mg/mL injection 4 mg  4 mg IntraVENous Q6H   ???  losartan/hydroCHLOROthiazide (HYZAAR) 100/25 mg   Oral DAILY   ??? NUTRITIONAL SUPPORT ELECTROLYTE PRN ORDERS   Does Not Apply PRN   ??? influenza vaccine 2017-18 (3 yrs+)(PF) (FLUZONE QUAD/FLUARIX QUAD) injection 0.5 mL  0.5 mL IntraMUSCular PRIOR TO DISCHARGE       Other Studies (last 24 hours):  Ct Head Wo Cont    Result Date: 11/19/2016  Noncontrast CT of the brain. COMPARISON: November 18, 2016 INDICATION:  Follow-up intracranial hemorrhage TECHNIQUE: Contiguous axial images were obtained from the skull base through the vertex without IV contrast. Radiation dose reduction techniques were used for this study:  Our CT scanners use one or all of the following: Automated exposure control, adjustment of the mA and/or kVp according to patient's size, iterative reconstruction. FINDINGS: There are multiple infratentorial and supratentorial hemorrhagic masses (up to 4). No significant change compared to yesterday's study. No midline shift or hydrocephalus. There is a stable mass in the left orbit. Included portions of the paranasal sinuses and the mastoid air cells are aerated. Surrounding bones are stable.     IMPRESSION: Multiple hemorrhagic brain masses. No significant change compared to CT from yesterday.    Ct Head Wo Cont    Result Date: 11/18/2016  CT HEAD WITHOUT CONTRAST 11/18/2016 HISTORY: Seizure-like activity. Melanoma. CyberKnife therapy at outside facility TECHNIQUE: Noncontrast axial images were obtained through the brain COMPARISON: MRI brain 14-Nov-2016 FINDINGS: There are multiple intra-axial lesions throughout the supratentorial and infratentorial brain, many of which contain hemorrhage. Findings suggest hemorrhagic metastases. A representative metastasis in the superior right parietal lobe measures 2.7 cm in diameter (image 22). There is edema and local mass effect associated with these lesions. There is no hydrocephalus or shift of the midline structures. The lesion in the right cerebellar hemisphere measures 3.5 cm in diameter. There is a lesion in the left orbit suggestive of a retrobulbar metastasis.     IMPRESSION: 1. Multiple hemorrhagic supratentorial and infratentorial metastases.  2. 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 The findings were called to Dr. Delora Fuel on 11/18/2016 at 5:05 PM by Dr. Velta Addison. [place]    Result Date: 11/18/2016   Chest X-ray INDICATION:   Altered mental status A portable AP view of the chest was obtained. FINDINGS: There is minimal left lower lobe infiltrate/atelectasis.  The right lung is clear.  The heart size is normal.  The bony thorax is intact.      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 HTN controlled, cont current  ?? Avoid antiplatelet or AC agents  ?? Cont keppra for seizure ppx  ?? MRI brain ordered by consultant  ?? XRT held today, but per family treatment still  planned for tomorrow.  If this is the case, she needs to have seizure precautions during transport and procedure e.g. ativan on call and nurse available to administer if needed.  ?? Cont PT/OT  ?? Transfer to oncology floor after mandatory 24 hour observation in ICU, if still stable.    DC planning/Dispo:  Ppd ordered.  SW consult  DVT ppx:  SCDs    Signed:  Elmer Ramp, MD

## 2016-11-19 NOTE — Progress Notes (Signed)
LEAPFROG PROTOCOL NOTE    Katelyn [name]  11/19/2016    The patient is currently in the critical care setting managed by Dr.Aqeel with metastatic melanoma to brain and seizure like activity and evidence of ICH of metastatic lesions.  The patient's chart is reviewed and the patient is discussed with the staff.  Patient is currently hemodynamically stable.  Patient has no needs identified for Intensivist management in the critical care setting at this time.  Please notify us if can be of assistance.  No charge billed to the patient.  Thank you.    Waverly Ferrari, MD

## 2016-11-19 NOTE — Progress Notes (Addendum)
Discussed with oncology.  Sounds like patient may just need seizure ppx in addition to her home decadron, regardless of whether the EEG is positive or not.  Also rehab.  It's not clear to me the hemorrhage is unexpected for her condition and it doesn't look surgical.  Will await specialists' recs

## 2016-11-19 NOTE — Progress Notes (Signed)
Brain imaging reviewed, spoke at length with Dr. Monia Pouch.  Pt has multiple hemorrhagic brain mets due to melanoma.  This is not a surgically amenable situation.  Agree with medical treatment of seizures and supportive care.  Management of melanoma per Oncology.  Recommend avoiding blood thinners if at all possible.  Please let us know if we can be of any further assistance.  Thanks!

## 2016-11-19 NOTE — Consults (Addendum)
[facility] Hematology & Oncology        Inpatient Hematology / Oncology Consult Note    Reason for Consult:  Metastatic melanoma of brain [facility])  Referring Physician:  Clement Sayres, MD    History of Present Illness:  Katelyn [name] is a 60 y.o. female admitted on 11/18/2016 . She is a patient of Dr. Jenetta Loges and Dr. Maceo Pro (radiation oncology) with metastatic melanoma. PET + for disease in liver, lung, lymph nodes. Liver biopsy done 10/22/16 confirmed metastatic disease.  PET also concerning for a lesion in the brain which was further explored on brain MRI and identified 7 lesions.  She has started cyberknife to the brain lesions yesterday 11/29 with plans for daily treatment.  Prior to beginning treatment, she described what was thought to be muscle spasms.  She was sent to the cancer center for labs and further evaluation.  On arrival, She presented with spasms and left sided weakness and what was presumed to be seizure activity. A rapid response was called and she was sent to the ER where she was admitted. CT scan of the head showed hemorrhage from metastatic lesions but otherwise stable exam.  Her dexamethasone was increased back to 4 mg every 6 hours and she is on keppra 500mg  BID.  She was temporarily placed on a cardene drip.  She was evaluated by neurosurgery but no surgical interventions were indicated.  We were consulted for further recommendations regarding her metastatic melanoma with hemorrhagic brain mets.  Clinically is improved from yesterday.       Review of Systems:  Constitutional Denies fever, chills, weight loss, appetite changes, fatigue, night sweats.   HEENT Denies trauma, blurry vision, hearing loss, ear pain, nosebleeds, sore throat, neck pain    Skin Denies lesions or rashes.   Lungs Denies dyspnea, cough, sputum production or hemoptysis.   Cardiovascular Denies chest pain, palpitations, or lower extremity edema.   Gastrointestinal Denies nausea, vomiting, changes in bowel habits, bloody  or black stools, abdominal pain.   GU Denies dysuria, frequency or hesitancy of urination.   Neuro Denies headaches, visual changes or ataxia. Denies dizziness, tingling, tremors, sensory change, speech change, focal weakness      Hematology Denies easy bruising or bleeding, denies gingival bleeding or epistaxis.   Endo Denies heat/cold intolerance, denies diabetes or thyroid abnormalities.   MSK Denies back pain, arthralgias, myalgias or frequent falls.     Psychiatric/Behavioral Denies depression and substance abuse. The patient is not nervous/anxious.         Allergies   Allergen Reactions   ??? Adhesive Tape-Silicones Rash     Past Medical History:   Diagnosis Date   ??? Cancer ([place])     melanoma - back   ??? Hypercholesterolemia    ??? Hypertension     managed with meds   ??? Nausea & vomiting    ??? Osteopenia      Past Surgical History:   Procedure Laterality Date   ??? HX COLONOSCOPY     ??? HX GYN      c-section x 5   ??? HX ORTHOPAEDIC Left 04/30/2016    knee   ??? HX OTHER SURGICAL      melanoma removed from back     Family History   Problem Relation Age of Onset   ??? Hypertension Mother    ??? Elevated Lipids Mother    ??? Elevated Lipids Father    ??? Hypertension Father    ??? Arthritis-osteo Father  spine   ??? Heart Disease Father    ??? Breast Cancer Other    ??? Diabetes Sister      Social History     Social History   ??? Marital status: MARRIED     Spouse name: N/A   ??? Number of children: N/A   ??? Years of education: N/A     Occupational History   ??? Not on file.     Social History Main Topics   ??? Smoking status: Never Smoker   ??? Smokeless tobacco: Never Used   ??? Alcohol use No   ??? Drug use: Not on file   ??? Sexual activity: Not on file     Other Topics Concern   ??? Not on file     Social History Narrative     Current Facility-Administered Medications   Medication Dose Route Frequency Provider Last Rate Last Dose   ??? niCARdipine (CARDENE) 25 mg in 0.9% sodium chloride 250 mL infusion   0-15 mg/hr IntraVENous TITRATE Towanda Octave III, MD   Stopped at 11/19/16 0200   ??? famotidine (PF) (PEPCID) 20 mg in sodium chloride 0.9 % 10 mL injection  20 mg IntraVENous Q12H Clement Sayres, MD   20 mg at 11/19/16 [id number]   ??? levETIRAcetam (KEPPRA) 500 mg in 0.9% sodium chloride 100 mL IVPB  500 mg IntraVENous Q12H Elmer Ramp, MD   Stopped at 11/19/16 0900   ??? sodium chloride (NS) flush 5-10 mL  5-10 mL IntraVENous Q8H Clement Sayres, MD   Stopped at 11/19/16 0600   ??? sodium chloride (NS) flush 5-10 mL  5-10 mL IntraVENous PRN Clement Sayres, MD       ??? labetalol (NORMODYNE;TRANDATE) injection 10 mg  10 mg IntraVENous Q10MIN PRN Clement Sayres, MD       ??? LORazepam (ATIVAN) injection 2 mg  2 mg IntraVENous PRN Clement Sayres, MD       ??? acetaminophen (TYLENOL) tablet 650 mg  650 mg Oral Q4H PRN Clement Sayres, MD       ??? HYDROcodone-acetaminophen (NORCO) 7.5-325 mg per tablet 1 Tab  1 Tab Oral Q4H PRN Clement Sayres, MD       ??? naloxone (NARCAN) injection 0.4 mg  0.4 mg IntraVENous PRN Clement Sayres, MD       ??? amLODIPine (NORVASC) tablet 10 mg  10 mg Oral QHS Clement Sayres, MD   Stopped at 11/18/16 2200   ??? fluticasone (FLONASE) 50 mcg/actuation nasal spray 2 Spray  2 Spray Both Nostrils QHS Clement Sayres, MD   2 Spray at 11/18/16 2244   ??? dexamethasone (DECADRON) 4 mg/mL injection 4 mg  4 mg IntraVENous Q6H Clement Sayres, MD   4 mg at 11/19/16 0542   ??? losartan/hydroCHLOROthiazide (HYZAAR) 100/25 mg   Oral DAILY Clement Sayres, MD       ??? NUTRITIONAL SUPPORT ELECTROLYTE PRN ORDERS   Does Not Apply PRN Dewayne Hatch, MD       ??? 0.9% sodium chloride infusion  50 mL/hr IntraVENous CONTINUOUS Dewayne Hatch, MD 50 mL/hr at 11/18/16 2243 50 mL/hr at 11/18/16 2243   ??? influenza vaccine 2017-18 (3 yrs+)(PF) (FLUZONE QUAD/FLUARIX QUAD) injection 0.5 mL  0.5 mL IntraMUSCular PRIOR TO DISCHARGE Jose R Calton Golds, MD           OBJECTIVE:  Patient Vitals for the past 8 hrs:   BP Temp Pulse Resp SpO2    11/19/16 1023 138/79 - 68 19 95 %  11/19/16 1003 (!) 149/102 - 74 30 95 %   11/19/16 0930 (!) 138/92 - 76 18 96 %   11/19/16 0900 (!) 145/93 - (!) 58 17 97 %   11/19/16 0830 149/86 - 66 26 94 %   11/19/16 0815 143/85 - 64 26 96 %   11/19/16 0800 132/89 - 74 22 92 %   11/19/16 0745 154/88 - 72 29 93 %   11/19/16 0730 136/82 - 64 18 92 %   11/19/16 0715 134/83 - 62 16 94 %   11/19/16 0700 139/80 97.8 ??F (36.6 ??C) 60 15 95 %   11/19/16 0645 140/83 - 62 15 95 %   11/19/16 0634 133/82 - 65 18 94 %   11/19/16 0617 132/74 - (!) 59 17 94 %   11/19/16 0602 135/78 - 65 17 94 %   11/19/16 0547 133/80 - 66 21 91 %   11/19/16 0532 140/79 - 73 (!) 37 92 %   11/19/16 0530 140/85 - 69 15 95 %     Temp (24hrs), Avg:98.2 ??F (36.8 ??C), Min:97.8 ??F (36.6 ??C), Max:98.5 ??F (36.9 ??C)    11/30 0701 - 11/30 1900  In: [id number] [P.O.:120; I.V.:445]  Out: -     Physical Exam:  Constitutional: Well developed, well nourished female in no acute distress, sitting comfortably in the hospital bed.    HEENT: Normocephalic and atraumatic. Oropharynx is clear, mucous membranes are moist.  Neck supple     Lymph node   Deferred   Skin Warm and dry.  No bruising and no rash noted.  No erythema.  No pallor.    Respiratory Lungs are clear to auscultation bilaterally without wheezes, rales or rhonchi, normal air exchange without accessory muscle use.    CVS Normal rate, regular rhythm and normal S1 and S2.  No murmurs, gallops, or rubs.   Abdomen Soft, nontender and nondistended, normoactive bowel sounds.  No palpable mass.  No hepatosplenomegaly.   Neuro Grossly nonfocal with no obvious sensory or motor deficits.   MSK Normal range of motion in general.  No edema and no tenderness.   Psych Appropriate mood and affect.        Labs:    Recent Results (from the past 24 hour(s))   EKG, 12 LEAD, INITIAL    Collection Time: 11/18/16  5:11 PM   Result Value Ref Range    Ventricular Rate 67 BPM    Atrial Rate 67 BPM    P-R Interval 164 ms    QRS Duration 86 ms     Q-T Interval 398 ms    QTC Calculation (Bezet) 420 ms    Calculated P Axis 54 degrees    Calculated R Axis 74 degrees    Calculated T Axis 74 degrees    Diagnosis       !! AGE AND GENDER SPECIFIC ECG ANALYSIS !!  Normal sinus rhythm  Normal ECG  No previous ECGs available  Confirmed by [facility]  MD ([facility]), MATTHEW G ([id number]) on 11/18/2016 9:44:20 PM     PTT    Collection Time: 11/18/16  5:12 PM   Result Value Ref Range    aPTT 22.0 (L) 23.5 - 31.7 SEC   GLUCOSE, POC, BEDSIDE    Collection Time: 11/18/16  5:13 PM   Result Value Ref Range    Glucose (POC) 147 (H) 65 - 100 MG/DL   POC PT/INR    Collection Time: 11/18/16  5:13 PM   Result Value  Ref Range    Prothrombin time (POC) 11.3 9.6 - 11.6 SECS    INR (POC) 0.9 0.9 - 1.2     CBC WITH AUTOMATED DIFF    Collection Time: 11/18/16  5:14 PM   Result Value Ref Range    WBC 13.0 (H) 4.3 - 11.1 K/uL    RBC 4.94 4.05 - 5.25 M/uL    HGB 14.8 11.7 - 15.4 g/dL    HCT 41.3 35.8 - 46.3 %    MCV 83.6 79.6 - 97.8 FL    MCH 30.0 26.1 - 32.9 PG    MCHC 35.8 (H) 31.4 - 35.0 g/dL    RDW 14.8 (H) 11.9 - 14.6 %    PLATELET 190 150 - 450 K/uL    MPV 8.8 (L) 10.8 - 14.1 FL    DF AUTOMATED      NEUTROPHILS 93 (H) 43 - 78 %    LYMPHOCYTES 2 (L) 13 - 44 %    MONOCYTES 5 4.0 - 12.0 %    EOSINOPHILS 0 (L) 0.5 - 7.8 %    BASOPHILS 0 0.0 - 2.0 %    IMMATURE GRANULOCYTES 0 0.0 - 5.0 %    ABS. NEUTROPHILS 12.0 (H) 1.7 - 8.2 K/UL    ABS. LYMPHOCYTES 0.3 (L) 0.5 - 4.6 K/UL    ABS. MONOCYTES 0.6 0.1 - 1.3 K/UL    ABS. EOSINOPHILS 0.0 0.0 - 0.8 K/UL    ABS. BASOPHILS 0.0 0.0 - 0.2 K/UL    ABS. IMM. GRANS. 0.1 0.0 - 0.5 K/UL   METABOLIC PANEL, BASIC    Collection Time: 11/18/16  5:14 PM   Result Value Ref Range    Sodium 136 136 - 145 mmol/L    Potassium 3.4 (L) 3.5 - 5.1 mmol/L    Chloride 95 (L) 98 - 107 mmol/L    CO2 30 21 - 32 mmol/L    Anion gap 11 7 - 16 mmol/L    Glucose 144 (H) 65 - 100 mg/dL    BUN 23 8 - 23 MG/DL    Creatinine 0.76 0.6 - 1.0 MG/DL    GFR est AA >60 >60 ml/min/1.43m2     GFR est non-AA >60 >60 ml/min/1.15m2    Calcium 8.2 (L) 8.3 - [id number] MG/DL   METABOLIC PANEL, BASIC    Collection Time: 11/19/16  6:10 AM   Result Value Ref Range    Sodium 138 136 - 145 mmol/L    Potassium 4.1 3.5 - 5.1 mmol/L    Chloride 102 98 - 107 mmol/L    CO2 26 21 - 32 mmol/L    Anion gap 10 7 - 16 mmol/L    Glucose 154 (H) 65 - 100 mg/dL    BUN 25 (H) 8 - 23 MG/DL    Creatinine 0.73 0.6 - 1.0 MG/DL    GFR est AA >60 >60 ml/min/1.52m2    GFR est non-AA >60 >60 ml/min/1.33m2    Calcium 8.0 (L) 8.3 - 10.4 MG/DL   CBC W/O DIFF    Collection Time: 11/19/16  6:10 AM   Result Value Ref Range    WBC 12.1 (H) 4.3 - 11.1 K/uL    RBC 4.78 4.05 - 5.25 M/uL    HGB 13.9 11.7 - 15.4 g/dL    HCT 41.8 35.8 - 46.3 %    MCV 87.4 79.6 - 97.8 FL    MCH 29.1 26.1 - 32.9 PG    MCHC 33.3 31.4 - 35.0 g/dL  RDW 15.3 (H) 11.9 - 14.6 %    PLATELET 192 150 - 450 K/uL    MPV 9.0 (L) 10.8 - 14.1 FL       Imaging:  CT HEAD WO CONT [id number] Collected: 11/19/16 0537    ?? Order Status: Completed Updated: 11/19/16 0541   ?? Narrative: ??   ?? Noncontrast CT of the brain.     COMPARISON: November 18, 2016    INDICATION: Follow-up intracranial hemorrhage    TECHNIQUE: Contiguous axial images were obtained from the skull base through the  vertex without IV contrast. Radiation dose reduction techniques were used for  this study: ??Our CT scanners use one or all of the following: Automated exposure  control, adjustment of the mA and/or kVp according to patient's size, iterative  reconstruction.    FINDINGS:    There are multiple infratentorial and supratentorial hemorrhagic masses (up to  4). No significant change compared to yesterday's study. No midline shift or  hydrocephalus.    There is a stable mass in the left orbit. Included portions of the paranasal  sinuses and the mastoid air cells are aerated. Surrounding bones are stable.     ?? Impression: ??   ?? IMPRESSION:    Multiple hemorrhagic brain masses. No significant change compared to CT  from  yesterday.   ?? XR CHEST PORT [id number] Collected: 11/18/16 1815   ?? Order Status: Completed Updated: 11/18/16 1818   ?? Narrative: ??   ?? Chest X-ray    INDICATION: ?? Altered mental status    A portable AP view of the chest was obtained.    FINDINGS: There is minimal left lower lobe infiltrate/atelectasis. ??The right  lung is clear. ??The heart size is normal. ??The bony thorax is intact. ??     ?? Impression: ??   ?? IMPRESSION: Minimal left lower lobe infiltrate/atelectasis     ?? CT HEAD WO CONT [id number] Collected: 11/18/16 1701   ?? Order Status: Completed Updated: 11/18/16 1709   ?? Narrative: ??   ?? CT HEAD WITHOUT CONTRAST 11/18/2016    HISTORY: Seizure-like activity. Melanoma. CyberKnife therapy at outside facility    TECHNIQUE: Noncontrast axial images were obtained through the brain    COMPARISON: MRI brain 2016-11-05    FINDINGS: There are multiple intra-axial lesions throughout the supratentorial  and infratentorial brain, many of which contain hemorrhage. Findings suggest  hemorrhagic metastases. A representative metastasis in the superior right  parietal lobe measures 2.7 cm in diameter (image 22). There is edema and local  mass effect associated with these lesions. There is no hydrocephalus or shift of  the midline structures. The lesion in the right cerebellar hemisphere measures  3.5 cm in diameter. There is a lesion in the left orbit suggestive of a  retrobulbar metastasis.     ?? Impression: ??   ?? IMPRESSION:    1. Multiple hemorrhagic supratentorial and infratentorial metastases. ??    2. 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    The findings were called to Dr. Delora Fuel on 11/18/2016 at 5:05 PM by Dr. Velta Addison.   [id number]       ??         ASSESSMENT:  Problem List  Date Reviewed: November 05, 2016          Codes Class Noted    * (Principal)Metastatic melanoma of brain [facility]) ICD-10-CM: C79.31  ICD-9-CM: 198.3  11/18/2016        Liver lesion ICD-10-CM: K76.9  ICD-9-CM: 573.8  10/16/2016  Osteopenia ICD-10-CM: M85.80  ICD-9-CM: 733.90  08/18/2013        HTN (hypertension) (Chronic) ICD-10-CM: I10  ICD-9-CM: 401.9  08/18/2013        Hyperlipidemia ICD-10-CM: E78.5  ICD-9-CM: 272.4  08/18/2013                RECOMMENDATIONS:  Metastatic melanoma, admitted with hemorrhagic brain mets, s/p 1st treatment with cyberknife on 11/29. Spoke with Dr. Maceo Pro, radiation oncologist, who was initially planning for her second treatment today. Will defer and repeat brain MRI today. Feels the bleeding/seizure activity not secondary to the treatment given timeline. Plan to resume tomorrow if she remains stable.  Continue dexamethasone and keppra. Discussion with Dr. Drema Halon regarding prognosis and long term survival. Family appreciative of care.     Suspect she is stable to transfer to floor soon but would anticipate hospitalization through the weekend. Follow up with Dr. Jenetta Loges at discharge     Lab studies and imaging studies (CT head) were personally reviewed.  Pertinent old records were reviewed from care everywhere.  Case discussed with Drs Maceo Pro and Quillian Quince. Thank you for allowing [place] to participate in the care of Ms. Woolf. We will follow along         Flavia Shipper, NP   [facility] Hematology & Oncology  [address] [place] [address]  Office : [phone]  Fax : [phone]         Attending Addendum:  I personally evaluated the patient with Marcelyn Ditty, N.P.,  and agree with the assessment, findings and plan as documented.  Appears stable, continue Dex and Keppra, she needs RT.              Floyde Parkins, MD  [facility]  [facility]  [address] [place] [address]  Office : [phone]  Fax : [phone]

## 2016-11-19 NOTE — Other (Signed)
Interdisciplinary team rounds were held 11/19/2016 with the following team members:Care Management, Nursing, Nurse Practitioner, Nutrition, Palliative Care, Pastoral Care, Pharmacy, Physical Therapy, Physician, Respiratory Therapy, Speech Therapy and Clinical Coordinator.    Plan of care discussed. See clinical pathway and/or care plan for interventions and desired outcomes.

## 2016-11-19 NOTE — Procedures (Signed)
[place]       Name:  Katelyn [name], Katelyn [name]   MR#:  [id number]   DOB:  03-Oct-1956   Account #:  [id number]   Date of Adm:  11/18/2016       EEG [id number]    PROCEDURE: Awake and asleep electroencephalogram.     ORDERING PHYSICIAN: Clement Sayres, MD    INDICATIONS: Seizure-like activities.     MEDICATION   1. Decadron.   2. Keppra.    TECHNIQUE:  An 18-channel EEG was performed in XLTek machine   with EKG monitoring. The international 10/20 electrode   placement system electrode placement system and standard montage   were used.    State of consciousness: Awake, drowsy, asleep.    Background EEG showed abundant drowsy periods, non-REM sleep   stage I and II, with normal potentials. Intermittent, brief   wakeful periods were noted with diffuse beta activities and   normal alpha rhythm, 8-9 Hz, symmetrical. No epileptiform   discharges were identified. No rhythmic activities.    Photic stimulation: Symmetric driving.    EKG 54/minute, regular.     INTERPRETATION: Electroencephalogram shows excessive drowsy and sleep   periods, otherwise this is a normal awake and asleep EEG. There   are no epileptiform discharges or lateralized slowing   activities. EKG monitoring shows bradycardia at 54 per minute.        Oneta Rack, MD      MZ / JB   D:  11/19/2016   17:01   T:  11/19/2016   22:31   Job #:  [id number]

## 2016-11-19 NOTE — Progress Notes (Signed)
Shift assessment complete, chart reviewed. Patient is alert, oriented x 4, follows commands. PERRLA. Vision is blurry, wears glasses, patient states this is her baseline with vision. Speech clear but does have some trouble completing sentences or forming complete thought. NSR on monitor, stable BP, goal SBP less than 140, Cardene gtt off since midnight. Breath sounds clear, room air. Abdomen is soft, intact, bowel sounds active. Voids to bedpan but will ambulate to [facility] to check gait. Moves all extremities, pulses palpable and regular, no edema noted. Weakness noted to left leg with some drift. No c/o pain at this time. VSS. NAD. Will continue with hourly neuro checks.

## 2016-11-19 NOTE — Progress Notes (Signed)
Bedside shift change report given to Katelyn [name], Therapist, sports (Soil scientist) by Andee Poles, RN (offgoing nurse). Report included the following information SBAR, Kardex, ED Summary, Intake/Output, MAR, Recent Results and Cardiac Rhythm NSR     Dual neuro assessment completed at shift change. Continues without any neuro deficits except for weakness to left leg with drift.

## 2016-11-20 ENCOUNTER — Inpatient Hospital Stay: Admit: 2016-11-20 | Payer: BLUE CROSS/BLUE SHIELD | Primary: Family Medicine

## 2016-11-20 LAB — POC INR

## 2016-11-20 MED ORDER — LIP PROTECTANT 0.6 %-0.5 %-1 %-0.5 % OINTMENT
CUTANEOUS | Status: DC | PRN
Start: 2016-11-20 — End: 2016-11-23

## 2016-11-20 MED ORDER — SALINE PERIPHERAL FLUSH PRN
Freq: Once | INTRAMUSCULAR | Status: AC
Start: 2016-11-20 — End: 2016-11-20
  Administered 2016-11-20: 13:00:00

## 2016-11-20 MED ORDER — HYDRALAZINE 10 MG TAB
10 mg | Freq: Three times a day (TID) | ORAL | Status: DC
Start: 2016-11-20 — End: 2016-11-23
  Administered 2016-11-20 – 2016-11-23 (×12): via ORAL

## 2016-11-20 MED ORDER — GADOBENATE DIMEGLUMINE 529 MG/ML (0.1 MMOL/0.2 ML) IV
529 mg/mL (0.1mmol/0.2mL) | Freq: Once | INTRAVENOUS | Status: AC
Start: 2016-11-20 — End: 2016-11-20
  Administered 2016-11-20: 13:00:00 via INTRAVENOUS

## 2016-11-20 MED FILL — AMLODIPINE 10 MG TAB: 10 mg | ORAL | Qty: 1

## 2016-11-20 MED FILL — HYDRALAZINE 10 MG TAB: 10 mg | ORAL | Qty: 1

## 2016-11-20 MED FILL — LEVETIRACETAM 500 MG TAB: 500 mg | ORAL | Qty: 1

## 2016-11-20 MED FILL — LIP PROTECTANT 0.6 %-0.5 %-1 %-0.5 % OINTMENT: CUTANEOUS | Qty: 1

## 2016-11-20 MED FILL — LOSARTAN 50 MG TAB: 50 mg | ORAL | Qty: 2

## 2016-11-20 MED FILL — DEXAMETHASONE SODIUM PHOSPHATE 4 MG/ML IJ SOLN: 4 mg/mL | INTRAMUSCULAR | Qty: 1

## 2016-11-20 NOTE — Progress Notes (Addendum)
Hospitalist Progress Note     Admit Date:  11/18/2016  4:33 PM   Name:  Katelyn [name]   Age:  60 y.o.  DOB:  10/07/1956   MRN:  [id number]   PCP:  Dianna Rossetti, MD  Treatment Team: Attending Provider: Clement Sayres, MD; Consulting Provider: Loney Loh, MD; Care Manager: Jerel Shepherd, RN    Subjective:   60 yo Pleasant and unfortunate Lady with PMH of Metastatic Melanoma follows up with Dr. Jenetta Loges presented to the ER with cc of left leg spasms and seizure like activity. Pt had cyber knife 11/29 at 1pm for metastatic brain mets. She felt at few tremors while getting the treatment but was able to finish the treatment. She left for home and while getting in car husband noticed she had decreased strength on left side. She then had witnessed tremors and spasms of left leg that looked like convulsions. She was brought in to the ER and CT head was done that showed Multiple hemorrhagic supratentorial and infratentorial metastases with Intraorbital suspected metastasis.  Oncology and neurosurg consulted; nonsurgical.  Was placed in ICU 24 hours per protocol on cardene gtt but quickly weaned off.    11/30 - pt reports still weak in left leg with numbness.  No other new symptoms.  No recurrence of convulsions or seizures.  No AMS or impaired consciousness.    12/1 - pt denies recurrence of seizure symptoms.  She has mild memory issues apparent by fact she denies knowledge of any bleeding lesions on her brain despite me talking about it with her yesterday. husband confirms she was told multiple times since presentation.  Says she is still weak in Left leg and needs help to walk.  Denies HA    Objective:     Patient Vitals for the past 24 hrs:   Temp Pulse Resp BP SpO2   11/20/16 0908 - 82 19 (!) 138/103 -   11/20/16 0701 98 ??F (36.7 ??C) 69 22 140/86 96 %   11/20/16 0631 - 72 16 135/85 96 %   11/20/16 0600 - 74 21 139/88 95 %   11/20/16 0531 - 87 (!) 45 138/85 -   11/20/16 0501 - (!) 58 29 138/80 -    11/20/16 0431 97.9 ??F (36.6 ??C) 82 30 141/76 -   11/20/16 0400 - (!) 55 14 124/71 -   11/20/16 0331 - 60 18 136/72 -   11/20/16 0301 - 68 19 (!) 153/93 94 %   11/20/16 0231 - 74 18 (!) 151/91 95 %   11/20/16 0200 - 74 19 (!) 152/92 -   11/20/16 0131 - 78 17 (!) 149/92 95 %   11/20/16 0101 - 100 22 (!) 149/94 93 %   11/20/16 0030 - (!) 54 15 145/88 94 %   11/20/16 0004 - (!) 58 16 146/88 94 %   11/19/16 2330 98.5 ??F (36.9 ??C) 69 19 145/88 93 %   11/19/16 2301 - 72 20 140/83 91 %   11/19/16 2246 - 70 - 140/86 -   11/19/16 2231 - 68 19 140/86 92 %   11/19/16 2200 - 74 26 140/85 96 %   11/19/16 2131 - 77 21 141/76 94 %   11/19/16 2101 - 74 23 141/79 94 %   11/19/16 2031 - 75 (!) 31 141/85 93 %   11/19/16 2000 98.3 ??F (36.8 ??C) 80 18 141/85 93 %   11/19/16 1931 - 75 (!) 31 152/86 93 %  11/19/16 1901 - 75 20 141/81 93 %   11/19/16 1830 - 77 24 146/81 95 %   11/19/16 1800 - 74 23 143/84 92 %   11/19/16 1731 - 77 20 146/89 94 %   11/19/16 1701 - 69 22 140/82 94 %   11/19/16 1630 - 68 22 135/84 93 %   11/19/16 1601 97.2 ??F (36.2 ??C) 80 - 143/88 94 %   11/19/16 1555 - 75 22 - 94 %   11/19/16 1531 - 77 22 153/81 95 %   11/19/16 1510 - 75 22 150/89 95 %   11/19/16 1348 - - - 128/77 96 %   11/19/16 1347 - 79 19 - 94 %   11/19/16 1333 - - - 127/78 94 %   11/19/16 1332 - 79 13 - 94 %   11/19/16 1318 - 76 21 127/72 93 %   11/19/16 1302 - 80 23 126/79 91 %   11/19/16 1248 - - - 137/77 -   11/19/16 1247 - 89 18 - -   11/19/16 1232 - - - 137/82 -   11/19/16 1218 98 ??F (36.7 ??C) 88 18 139/81 93 %   11/19/16 1215 - 76 17 - 95 %   11/19/16 1203 - 72 19 140/82 94 %   11/19/16 1148 - 77 21 135/78 92 %   11/19/16 1133 - 80 24 146/87 94 %   11/19/16 1117 - 79 22 137/84 92 %   11/19/16 1102 - - - (!) 133/92 93 %   11/19/16 1100 - 84 18 - 92 %   11/19/16 1047 - 89 16 (!) 146/93 96 %   11/19/16 1037 - 80 21 - 93 %   11/19/16 1032 - - - (!) 128/91 95 %   11/19/16 1023 - 68 19 138/79 95 %   11/19/16 1003 - 74 30 (!) 149/102 95 %      Oxygen Therapy  O2 Sat (%): 96 % (11/20/16 0701)  Pulse via Oximetry: 70 beats per minute (11/20/16 0701)  O2 Device: Room air (11/19/16 2000)    Intake/Output Summary (Last 24 hours) at 11/20/16 1001  Last data filed at 11/19/16 1752   Gross per 24 hour   Intake              442 ml   Output                0 ml   Net              442 ml         General:    Well nourished.  Alert.    CV:   RRR.  No murmur, rub, or gallop.  Extremities: Warm and dry.  No cyanosis or edema.   Skin:     No rashes or jaundice.     Data Review:  I have reviewed all labs, meds, telemetry events, and studies from the last 24 hours.    No results found for this or any previous visit (from the past 24 hour(s)).     All Micro Results     None          Current Meds:  Current Facility-Administered Medications   Medication Dose Route Frequency   ??? lip protectant (BLISTEX) ointment   Topical PRN   ??? hydrALAZINE (APRESOLINE) tablet 10 mg  10 mg Oral TID   ??? hydrALAZINE (APRESOLINE) 20 mg/mL injection 20 mg  20 mg IntraVENous Q6H PRN   ???  levETIRAcetam (KEPPRA) tablet 500 mg  500 mg Oral BID   ??? LORazepam (ATIVAN) injection 2 mg  2 mg IntraVENous Q4H PRN   ??? sodium chloride (NS) flush 5-10 mL  5-10 mL IntraVENous Q8H   ??? sodium chloride (NS) flush 5-10 mL  5-10 mL IntraVENous PRN   ??? acetaminophen (TYLENOL) tablet 650 mg  650 mg Oral Q4H PRN   ??? HYDROcodone-acetaminophen (NORCO) 7.5-325 mg per tablet 1 Tab  1 Tab Oral Q4H PRN   ??? naloxone (NARCAN) injection 0.4 mg  0.4 mg IntraVENous PRN   ??? amLODIPine (NORVASC) tablet 10 mg  10 mg Oral QHS   ??? dexamethasone (DECADRON) 4 mg/mL injection 4 mg  4 mg IntraVENous Q6H   ??? losartan/hydroCHLOROthiazide (HYZAAR) 100/25 mg   Oral DAILY   ??? NUTRITIONAL SUPPORT ELECTROLYTE PRN ORDERS   Does Not Apply PRN   ??? influenza vaccine 2017-18 (3 yrs+)(PF) (FLUZONE QUAD/FLUARIX QUAD) injection 0.5 mL  0.5 mL IntraMUSCular PRIOR TO DISCHARGE       Other Studies (last 24 hours):  Mri Brain W Wo Cont     Result Date: 11/20/2016  MRI BRAIN WITHOUT AND WITH CONTRAST 11/20/2016 HISTORY: 60 year old with metastatic melanoma. Hemorrhagic brain metastases. Treatment with CyberKnife on 11/29. TECHNIQUE: Sagittal and axial T1-weighted, axial T2-weighted, axial FLAIR, axial T2-weighted gradient-echo, axial diffusion weighted images with ADC maps and postcontrast axial and coronal T1-weighted images of the brain.  13cc of MultiHance gadolinium contrast was administered intravenously without adverse event to evaluate for pathological leptomeningeal or parenchymal enhancement. COMPARISON: 2016-11-24 FINDINGS: As demonstrated on the comparison MRI, there are multiple enhancing intracranial metastases which are unchanged in number. A metastasis in the right parietal lobe has increased in size and hemorrhaged since the previous study. The size of the enhancing metastasis and surrounding hemorrhage is approximately 3 cm (series 4 image 21). The enhancing component of the metastasis within the hemorrhage today measures 16 mm in diameter compared to 12 mm on 2016/11/24. The enhancing component of the right cerebellar metastasis today measures 25 x 32 mm compared to 23 x 27 mm on the previous study. On the T2-weighted and FLAIR sequences, there are multiple additional hyperintense white matter lesions scattered throughout the supratentorial brain. This pattern may be present with chronic small vessel ischemic disease. There is a retro-orbital mass on the left eye that measures 2 cm in AP diameter. Differential considerations include an orbital metastasis or cavernous hemangioma. The pituitary gland enhances homogeneously. The gland measures 10 mm in height with an upwardly bulging superior border.     IMPRESSION: 1. Multiple hemorrhagic intracranial metastases including an enlarging right parietal metastasis which has hemorrhaged since 24-Nov-2016. 2. 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 HTN add low dose hydralazine for better HTN control today  ?? Avoid antiplatelet or AC agents  ?? Cont keppra for seizure ppx  ?? MRI brain results noted  ?? XRT held yest.  Defer to oncology on treatment today.  If this is the case, she needs to have seizure precautions during transport and procedure e.g. ativan on call and nurse available to administer if needed.  ?? Cont PT/OT  ?? Transfer to oncology floor    DC planning/Dispo:  Ppd ordered.  SW consult  DVT ppx:  SCDs    Signed:  Elmer Ramp, MD

## 2016-11-20 NOTE — Progress Notes (Signed)
Notified by [facility] that transport will pick up patient at 2pm to take to radiation appointment.

## 2016-11-20 NOTE — Other (Signed)
Interdisciplinary team rounds were held 11/20/2016 with the following team members:Care Management, Nursing, Nurse Practitioner, Nutrition, Palliative Care, Pharmacy, Physical Therapy, Physician, Respiratory Therapy and Clinical Coordinator.    Plan of care discussed. See clinical pathway and/or care plan for interventions and desired outcomes.

## 2016-11-20 NOTE — Other (Signed)
Utilization Review         ?? Neurology [place] - Care Day 3 (11/20/2016) by Serena Colonel, RN  ??   ?? Review Status Review Entered ??   ?? Completed 11/20/2016 ??   ?? Details ??   ??  ??   ?? Care Day: 3 Care Date: 11/20/2016 Level of Care: ICU ??   ?? Guideline Day 2  ??   ?? Clinical Status ??   ?? ( ) * No ICU or intermediate care needs ??   ?? 11/20/2016 1:40 PM EST by Toma Deiters ??   ?? ?? ICU ??   ??  ??   ??  ??   ?? Interventions ??   ?? (X) Inpatient interventions continue ??   ?? 11/20/2016 1:40 PM EST by Toma Deiters ??   ?? ?? NORVASC 10 MG PO Q HS, DECADRON 4 MG IV Q 6, APRESOLINE 10 MG PO TID, KEPPRA 500 MG PO BID, HYZAAR PO 100/25 MG QD, ??   ??  ??   ??  ??   ?? 11/20/2016 1:40 PM EST by Toma Deiters ??   ?? Subject: Additional Clinical Information ??   ?? ??pt denies recurrence of seizure symptoms. ?? She has mild memory issues apparent by fact she denies knowledge of any bleeding lesions on her brain despite me talking about it with her yesterday. husband confirms she was told multiple times since presentation. ?? Says she is still weak in Left leg and needs help to walk. ?? Denies HAPLAN: ?? HTN add low dose hydralazine for better HTN control todayAvoid antiplatelet or AC agentsCont keppra for seizure ppxMRI brain results notedXRT held yest. ?? Defer to oncology on treatment today. If this is the case, she needs to have seizure precautions during transport and procedure e.g. ativan on call and nurse available to administer if needed.Cont PT/OTTransfer to oncology floor ??DC planning/Dispo: ?? Ppd ordered. ?? SW consultDVT ppx: ?? SCDs ?? BP 154/103, NSR 90, RR 17-31, ????SAT RA 96%, MRI-IMPRESSION: ??1. Multiple hemorrhagic intracranial metastases including an enlarging rightparietal metastasis which has hemorrhaged since October 30, 2016. ??2. 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 ??   ??  ??   ??  ??   ??  ??   ??  ??   ?? * Milestone ??   ??  ??   ??   ?? Neurology [place] - Care Day 2 (11/19/2016) by Alean Rinne, RN  ??    ?? Review Status Review Entered ??   ?? Completed 11/19/2016 ??   ?? Details ??   ??  ??   ?? Care Day: 2 Care Date: 11/19/2016 Level of Care: ICU ??   ?? Guideline Day 2  ??   ?? Level Of Care ??   ?? (X) Floor ??   ??  ??   ?? Clinical Status ??   ?? (X) * No ICU or intermediate care needs ??   ??  ??   ?? Interventions ??   ?? (X) Inpatient interventions continue ??   ?? (X) Transition to oral routes ??   ??  ??   ?? 11/19/2016 1:38 PM EST by Orie Rout ??   ?? Subject: Additional Clinical Information ??   ?? 97.8, 149/102, HR 58, RR 18, o2 sat 92, RA ??   ??    ??   ??   Meds: NS 50 ml.hr, decadron iv q6h, Pepcid iv q12h, Flonase, hyzaar po, keppra  iv, ??   ??    ??   ??   Labs: WBC 12.1, RDw 15.2, MPV 9.0, Glucsoe 154, Bun 25, calcium 8.0, ??   ??   CT HEAD: Multiple hemorrhagic brain masses. No significant change compared to CT from ??   ??   Yesterday ??   ??  ??   ??  ??   ??  ??   ??  ??   ??  ??   ?? * Milestone ??   ??  ??   ?? Additional Notes ??   ?? Hospitalist: ??   ?? Discussed with oncology. ??Sounds like patient may just need seizure ppx in addition to her home decadron, regardless of whether the EEG is positive or not. ??Also rehab. ??It's not clear to me the hemorrhage is unexpected for her condition and it doesn't look surgical. ??Will await specialists' recs ??   ??  ??   ??  ??   ?? Onc: ??   ?? RECOMMENDATIONS: ??   ?? Metastatic melanoma, admitted with hemorrhagic brain mets, s/p 1st treatment with cyberknife on 11/29. Spoke with Dr. Maceo Pro, radiation oncologist, who was initially planning for her second treatment today. Will defer and repeat brain MRI today. Feels the bleeding/seizure activity not secondary to the treatment given timeline. Plan to resume tomorrow if she remains stable. ??Continue dexamethasone and keppra. Discussion with Dr. Drema Halon regarding prognosis and long term survival. Family appreciative of care.  ??   ?? ?? ??   ?? Suspect she is stable to transfer to floor soon but would anticipate  hospitalization through the weekend. Follow up with Dr. Jenetta Loges at discharge ??   ??  ??   ?? Neuro Surg; ??   ?? Brain imaging reviewed, spoke at length with Dr. Monia Pouch. ??Pt has multiple hemorrhagic brain mets due to melanoma. ??This is not a surgically amenable situation. ??Agree with medical treatment of seizures and supportive care. ??Management of melanoma per Oncology. ??Recommend avoiding blood thinners if at all possible

## 2016-11-20 NOTE — Progress Notes (Signed)
Date of Outreach Update:  Katelyn [name] was seen and assessed. She is alert and oriented and is resting in bed. Husband is at bedside. O2 93% on RA. Primary RN with no concerns.       MEWS Score: 1 (11/20/16 1758)  Vitals:    11/20/16 1200 11/20/16 1301 11/20/16 1400 11/20/16 1758   BP: (!) 169/95 (!) 141/95 147/84 136/85   Pulse: 90 82 80 77   Resp: 17 (!) 31 18 18    Temp:    98.6 ??F (37 ??C)   SpO2:    94%   Weight:       Height:             Pain Assessment  Pain Intensity 1: 0 (11/20/16 2157)        Patient Stated Pain Goal: 0      Previous Outreach assessment has been reviewed.  There have been no significant clinical changes since the completion of the last dated Outreach assessment.    Will continue to follow up per outreach protocol.    Signed By:   Vanessa Kick, RN    November 20, 2016 11:20 PM

## 2016-11-20 NOTE — Progress Notes (Signed)
OT NOTE:    OT order received, chart reviewed. Pt to [facility] for tx.  OT will attempt evaluation tomorrow.  Thank you,    Clarisa Kindred, MS, OTR/L  11/20/2016

## 2016-11-20 NOTE — Progress Notes (Signed)
Was going to see pt when she left, and family, for radiation treatment. Will attempt to see at later time.

## 2016-11-20 NOTE — Progress Notes (Signed)
Patient arrived back to unit from radiation treatment. Instructed transporters to take patient to room 722. Patient and family aware, notifying other family members.

## 2016-11-20 NOTE — Progress Notes (Addendum)
Spoke with Dr. Quillian Quince, [place] for patient to be transferred to a medical floor instead of oncology floor.

## 2016-11-20 NOTE — Progress Notes (Signed)
SPEECH PATHOLOGY NOTE:    Attempted to see patient for po trials this AM. Currently off the floor. Will re-attempt when patient is available.     Delight Stare, MSP, CCC-SLP, CBIS

## 2016-11-20 NOTE — Progress Notes (Addendum)
Inpatient Hematology / Oncology Progress Note      Admission Date: 11/18/2016  4:33 PM  Reason for Admission/Hospital Course: 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[place])      24 Hour Events:  No seizure like activity   Some confusion, short term memory lacks  Will proceed with 2nd cyberknife today at [facility]   Left extremities with increased strength   No new complaints    ROS:  Constitutional: Negative for fever, chills, weakness, malaise, fatigue.  CV: Negative for chest pain, palpitations, edema.  Respiratory: Negative for dyspnea, cough, wheezing.  GI: Negative for nausea, abdominal pain, diarrhea.    10 point review of systems is otherwise negative with the exception of the elements mentioned above in the HPI.     Allergies   Allergen Reactions   ??? Adhesive Tape-Silicones Rash       OBJECTIVE:  Patient Vitals for the past 8 hrs:   BP Temp Pulse Resp   11/20/16 1400 147/84 - 80 18   11/20/16 1301 (!) 141/95 - 82 (!) 31   11/20/16 1200 (!) 169/95 - 90 17   11/20/16 1101 (!) 154/103 97.7 ??F (36.5 ??C) 87 (!) 31   11/20/16 1000 150/89 - 81 25   11/20/16 0908 (!) 138/103 - 82 19     Temp (24hrs), Avg:97.9 ??F (36.6 ??C), Min:97.2 ??F (36.2 ??C), Max:98.5 ??F (36.9 ??C)    12/01 0701 - 12/01 1900  In: -   Out: 450 [Urine:450]    Physical Exam:  Constitutional: Well developed, well nourished female in no acute distress, sitting comfortably in the hospital bed. Family at bedside in ICU.   HEENT: Normocephalic and atraumatic. Oropharynx is clear, mucous membranes are moist.  Sclerae anicteric. Neck supple without JVD. No thyromegaly present.    Lymph node   Deferred.   Skin Warm and dry.  No bruising and no rash noted.  No erythema.  No pallor.    Respiratory Lungs are clear to auscultation bilaterally without wheezes, rales or rhonchi, normal air exchange without accessory muscle use.    CVS Normal rate, regular rhythm and normal S1 and S2.  No murmurs, gallops, or rubs.    Abdomen Soft, nontender and nondistended, normoactive bowel sounds.  No palpable mass.  No hepatosplenomegaly.   Neuro Grossly nonfocal with no obvious sensory or motor deficits.   MSK 4/5 strength to LUE and LLE. Normal range of motion in general.  No edema and no tenderness.   Psych Very pleasant, appropriate.       Labs:    Recent Labs      11/19/16   0610  11/18/16   1714   WBC  12.1*  13.0*   RBC  4.78  4.94   HGB  13.9  14.8   HCT  41.8  41.3   MCV  87.4  83.6   MCH  29.1  30.0   MCHC  33.3  35.8*   RDW  15.3*  14.8*   PLT  192  190   GRANS   --   93*   LYMPH   --   2*   MONOS   --   5   EOS   --   0*   BASOS   --   0   IG   --   0   DF   --   AUTOMATED   ANEU   --   12.0*   ABL   --   0.3*  ABM   --   0.6   ABE   --   0.0   ABB   --   0.0   AIG   --   0.1      Recent Labs      11/19/16   0610  11/18/16   1714   NA  138  136   K  4.1  3.4*   CL  102  95*   CO2  26  30   AGAP  10  11   GLU  154*  144*   BUN  25*  23   CREA  0.73  0.76   GFRAA  >60  >60   GFRNA  >60  >60   CA  8.0*  8.2*         Imaging:      ASSESSMENT:    Problem List  Date Reviewed: 2016-11-28          Codes Class Noted    Memory difficulties ICD-10-CM: R41.3  ICD-9-CM: 780.93  11/20/2016        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[place]) ICD-10-CM: I61.9  ICD-9-CM: 578  11/19/2016        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   12/1-Per Dr Maceo Pro, she will proceed with 2nd cyberknife today at [facility],  Continue dexamethasone and keppra.   Discussion with Dr. Drema Halon regarding prognosis and long term survival.   She has a known BRAF mutation, awaiting insurance approval to target therapy  She will likely transfer to 5th floor after XRT since she has remained stable.              Charna Elizabeth, NP  [facility] [place] [address]  Office : [phone]  Fax : [phone]       Attending Addendum:  I personally evaluated the patient with Delbert Phenix, N.P.,  and agree with the assessment, findings and plan as documented.  Continue Dex and Keppra, she will not receive RT over the weekend.              Floyde Parkins, MD  [facility]  [facility]  [address] [place] [address]  Office : [phone]  Fax : [phone]

## 2016-11-20 NOTE — Progress Notes (Signed)
Verbal bedside report received from [place], [place].  Shift assessment completed. Patient is alert and oriented x4, able to verbalize needs. Respirations even and unlabored, breath sounds clear. Patient does have slight left arm drift and decreased sensation of the left leg. Patient denies blurry vision or vision loss. Patient denies pain at this time.      Lines:  Peripheral IVs    Drips:  None    Drains:  None    Airway:  Room Air

## 2016-11-20 NOTE — Progress Notes (Signed)
Bedside, Verbal and Written shift change report given to Sonia Baller, Therapist, sports (oncoming nurse) by Inez Catalina, RN (offgoing nurse). Report included the following information SBAR, Kardex, ED Summary, Procedure Summary, Intake/Output, MAR, Accordion, Recent Results, Med Rec Status, Cardiac Rhythm NSR and Alarm Parameters .

## 2016-11-20 NOTE — Progress Notes (Signed)
TRANSFER - OUT REPORT:    Verbal report given to Lollie Marrow, RN on Katelyn [name]  being transferred to 722 for routine progression of care       Report consisted of patient???s Situation, Background, Assessment and   Recommendations(SBAR).     Information from the following report(s) SBAR, Kardex, MAR, Recent Results and Cardiac Rhythm NSR was reviewed with the receiving nurse.    Lines:   Peripheral IV 11/18/16 Right Antecubital (Active)   Site Assessment Clean, dry, & intact 11/20/2016  7:15 AM   Phlebitis Assessment 0 11/20/2016  7:15 AM   Infiltration Assessment 0 11/20/2016  7:15 AM   Dressing Status Clean, dry, & intact 11/20/2016  7:15 AM   Dressing Type Tape;Transparent 11/20/2016  7:15 AM   Hub Color/Line Status Blue;Capped 11/20/2016  7:15 AM   Alcohol Cap Used No 11/20/2016  7:15 AM       Peripheral IV 11/18/16 Left Antecubital (Active)   Site Assessment Clean, dry, & intact 11/20/2016  7:15 AM   Phlebitis Assessment 0 11/20/2016  7:15 AM   Infiltration Assessment 0 11/20/2016  7:15 AM   Dressing Status Clean, dry, & intact 11/20/2016  7:15 AM   Dressing Type Tape;Transparent 11/20/2016  7:15 AM   Hub Color/Line Status Green;Capped 11/20/2016  7:15 AM   Alcohol Cap Used No 11/20/2016  7:15 AM        Opportunity for questions and clarification was provided.      Patient transported with:  EMS transporters  Husband with belongings including 2 iPads, 1 Chemical engineer and 1 phone charger    Patient to go to room 722 upon return from radiation treatment.

## 2016-11-20 NOTE — Progress Notes (Signed)
LTG: Patient will tolerate least restrictive diet without overt signs or symptoms of airway compromise. - MET 11/20/16  STG: Patient will tolerate regular and thin liquids without overt signs or symptoms of airway compromise. - MET 11/20/16  STG: Patient will participate in modified barium swallow study as clinically indicated. - DISCONTINUE  STG: Patient will participate in full speech-language/cognitive assessment    Speech language pathology: bedside swallow note: Daily Note 1    NAME/AGE/GENDER: Katelyn [name] is a 60 y.o. 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    INTERDISCIPLINARY COLLABORATION: Registered Nurse and Physician  PRECAUTIONS/ALLERGIES: Adhesive tape-silicones ASSESSMENT:Patient seen at bedside with AM meal to assess diet tolerance. She independently consumed thin via straw, puree, mixed, and solid consistencies. Impulsivity with self-feeding, benefiting from minimal prompts to reduce rate of intake and increase mastication. No oral residue noted after the swallow. Single swallows with each trial. No throat clears noted this date. Vocal quality remained clear throughout.   Recommend continue with regular diet and thin liquids. No additional dysphagia therapy warranted at this time as swallow function is within normal limits.   During set-up and meal, patient with decreased attention to items on left side of meal try. Decreased planning and organization also noted as she kept empty butter containers in left hand, while attempting to eat with right and use left hand to hold foods (did not put down empty butter container). She was expressed decreased inhibition and stated "What difference does it make now?  I can do what I want". Safety awareness is a concern due to impulsivity and planning deficits.   Speech therapy to follow up for full speech-language/cognitive assessment.    Patient will benefit from skilled intervention to address the below impairments.  ????????This section established at most recent assessment??????????  PROBLEM LIST (Impairments causing functional limitations):  1. oropharyngeal dysphagia  REHABILITATION POTENTIAL FOR STATED GOALS: Guarded  PLAN OF CARE:   Patient will benefit from skilled intervention to address the following impairments.  RECOMMENDATIONS AND PLANNED INTERVENTIONS (Benefits and precautions of therapy have been discussed with the patient.):  ?? PO:  Regular  ?? Liquids:  regular thin  MEDICATIONS:  ?? With liquid  COMPENSATORY STRATEGIES/MODIFICATIONS INCLUDING:  ?? Small sips and bites  OTHER RECOMMENDATIONS (including follow up treatment recommendations):   ?? Patient education  RECOMMENDED DIET MODIFICATIONS DISCUSSED WITH:  ?? Medical Sub-Specialist  ?? Nursing  ?? Patient  FREQUENCY/DURATION: Continue to follow patient 3 times a week for duration of hospital stay to address above goals.RECOMMENDED REHABILITATION/EQUIPMENT: (at time of discharge pending progress): Due to the probability of continued deficits (see above) this patient will likely need continued skilled speech therapy after discharge.  SUBJECTIVE:   Impulsive. Family at bedside  History of Present Injury/Illness: Katelyn [name]  has a past medical history of Cancer ([place]); Hypercholesterolemia; Hypertension; Nausea & vomiting; and Osteopenia. She also has no past medical history of Adverse effect of anesthesia; Difficult intubation; Malignant hyperthermia due to anesthesia; or Pseudocholinesterase deficiency..  She also  has a past surgical history that includes colonoscopy; gyn; other surgical; and orthopaedic (Left, 04/30/2016).   Present Symptoms: Throat clears with liquids   Pain Intensity 1: 0  Current Medications:   No current facility-administered medications on file prior to encounter.      Current Outpatient Prescriptions on File Prior to Encounter   Medication Sig Dispense Refill    ???  dabrafenib (TAFINLAR) 75 mg cap Take 2 Caps by mouth two (2) times a day. 120 Cap 3   ??? trametinib (MEKINIST) 2 mg tab Take 1 Tab by mouth daily. 30 Tab 3   ??? dexamethasone (DECADRON) 4 mg tablet Take 1 Tab by mouth two (2) times daily (with meals). 60 Tab 2   ??? simvastatin (ZOCOR) 20 mg tablet Take 1 Tab by mouth nightly. 90 Tab 3   ??? amLODIPine (NORVASC) 10 mg tablet Take 1 Tab by mouth nightly. 90 Tab 3   ??? losartan-hydroCHLOROthiazide (HYZAAR) 100-25 mg per tablet Take 1 Tab by mouth daily. 90 Tab 3   ??? multivitamin (ONE A DAY) tablet Take 1 Tab by mouth daily.     ??? TURMERIC ROOT EXTRACT PO Take  by mouth daily.     ??? ferrous sulfate (IRON) 325 mg (65 mg iron) tablet Take  by mouth Daily (before breakfast).     ??? fluticasone (FLONASE) 50 mcg/actuation nasal spray 2 Sprays by Both Nostrils route nightly.     ??? aspirin delayed-release 81 mg tablet Take  by mouth nightly. Indications: prevention of transient ischemic attack     ??? calcium-cholecalciferol, d3, (CALCIUM 600 + D) 600-125 mg-unit tab Take  by mouth three (3) times daily.     ??? cetirizine (ZYRTEC) 10 mg tablet Take  by mouth.       Current Dietary Status:  Regular/thin  ?   Social History/Home Situation:    Home Environment: Private residence  # Steps to Enter: 3  Rails to Leisure centre manager: No  One/Two Story Residence: Two Actuary of Interior Steps: 12  Interior Rails: Right  Living Alone: No  Support Systems: Copy, Child(ren), Family member(s)  Patient Expects to be Discharged to:: Private residence  Current DME Used/Available at Home: None  Tub or Shower Type: Shower  OBJECTIVE:   Respiratory Status:        CXR Results:Minimal left lower lobe infiltrate/atelectasis  Oral Motor Structure/Speech:  Oral-Motor Structure/Motor Speech  Labial: No impairment  Dentition: Natural  Oral Hygiene: Adequate  Lingual: No impairment    Cognitive and Communication Status:  Neurologic State: Alert  Orientation Level: Oriented X4   Cognition: Follows commands;Impulsive;Impaired decision making;Poor safety awareness  Perception: Cues to attend left visual field  Perseveration: No perseveration noted  Safety/Judgement: Awareness of environment;Insight into deficits    BEDSIDE SWALLOW EVALUATION  Oral Assessment:  Oral Assessment  Labial: No impairment  Dentition: Natural  Oral Hygiene: Adequate  Lingual: No impairment  P.O. Trials:  Patient Position: Upright in bedside chair    The patient was given tsp-small bite amounts of the following:   Consistency Presented: Thin liquid;Solid;Puree;Mixed consistency  How Presented: Self-fed/presented;Cup/sip;Straw    ORAL PHASE:  Bolus Acceptance: No impairment  Bolus Formation/Control: No impairment  Propulsion: No impairment     Oral Residue: None    PHARYNGEAL PHASE:  Initiation of Swallow: No impairment  Laryngeal Elevation: Functional  Aspiration Signs/Symptoms: None  Vocal Quality: No impairment  Cues for Modifications: None  Effective Modifications: None     Pharyngeal Phase Characteristics: No impairment, issues, or problems     OTHER OBSERVATIONS:  Rate/bite size: WNL   Endurance:  Questionable   Comments:      Tool Used: Dysphagia Outcome and Severity Scale (DOSS)    Score Comments   Normal Diet   7 With no strategies or extra time needed   Functional Swallow   6 May have mild oral or  pharyngeal delay       Mild Dysphagia     5 Which may require one diet consistency restricted (those who demonstrate penetration which is entirely cleared on MBS would be included)   Mild-Moderate Dysphagia   4 With 1-2 diet consistencies restricted       Moderate Dysphagia   3 With 2 or more diet consistencies restricted       Moderately Severe Dysphagia   2 With partial PO strategies (trials with ST only)       Severe Dysphagia   1 With inability to tolerate any PO safely          Score:  Initial: 6 Most Recent: X (Date: -- )   Interpretation of Tool: The Dysphagia Outcome and Severity Scale (DOSS) is  a simple, easy-to-use, 7-point scale developed to systematically rate the functional severity of dysphagia based on objective assessment and make recommendations for diet level, independence level, and type of nutrition.     Score _0 Modifier CH CI CJ CK CL CM CN   ? Swallowing:    H2992 - CURRENT STATUS: CI - 1%-19% impaired, limited or restricted   E2683 - GOAL STATUS:  CH - 0% impaired, limited or restricted   M1962 - D/C STATUS:  ---------------To be determined---------------  Payor: BLUE CROSS / Plan: [place] / Product Type: PPO /     TREATMENT:    (In addition to Assessment/Re-Assessment sessions the following treatments were rendered)  Assessment/Reassessment only, no treatment provided today  MODALITIES:                                                                    ORAL MOTOR  EXERCISES:                                                                                                                                                                      LARYNGEAL / PHARYNGEAL EXERCISES:  __________________________________________________________________________________________________  Safety:   After treatment position/precautions:  ?? Upright in Bed.  Recommendations/Intent for next treatment session: No additional dysphagia treatment warranted as swallow function is within norma limits.  "Treatment next visit will focus on speech -language/cognitive assessment".    Total Treatment Duration:  Time In: 0909  Time Out: 0919    Delight Stare, MSP, CCC-SLP, CBIS

## 2016-11-20 NOTE — Progress Notes (Signed)
Patient taken to [facility] for XRT therapy via [facility].

## 2016-11-20 NOTE — Progress Notes (Signed)
Problem: Falls - Risk of  Goal: *Absence of Falls  Document Schmid Fall Risk and appropriate interventions in the flowsheet.   Outcome: Progressing Towards Goal  Fall Risk Interventions:  Mobility Interventions: Communicate number of staff needed for ambulation/transfer, Bed/chair exit alarm, OT consult for ADLs, Patient to call before getting OOB, PT Consult for assist device competence, PT Consult for mobility concerns, Strengthening exercises (ROM-active/passive), Utilize walker, cane, or other assitive device, Utilize gait belt for transfers/ambulation         Medication Interventions: Assess postural VS orthostatic hypotension, Bed/chair exit alarm, Patient to call before getting OOB, Teach patient to arise slowly, Evaluate medications/consider consulting pharmacy, Utilize gait belt for transfers/ambulation    Elimination Interventions: Bed/chair exit alarm, Call light in reach, Elevated toilet seat, Patient to call for help with toileting needs, Toilet paper/wipes in reach, Toileting schedule/hourly rounds    History of Falls Interventions: Bed/chair exit alarm, Consult care management for discharge planning, Door open when patient unattended, Evaluate medications/consider consulting pharmacy, Investigate reason for fall, Room close to nurse's station, Utilize gait belt for transfer/ambulation

## 2016-11-20 NOTE — Progress Notes (Signed)
Problem: Falls - Risk of  Goal: *Absence of Falls  Document Schmid Fall Risk and appropriate interventions in the flowsheet.   Outcome: Progressing Towards Goal  Fall Risk Interventions:  Mobility Interventions: Bed/chair exit alarm, Communicate number of staff needed for ambulation/transfer, OT consult for ADLs, Patient to call before getting OOB, PT Consult for mobility concerns, PT Consult for assist device competence, Strengthening exercises (ROM-active/passive)         Medication Interventions: Bed/chair exit alarm, Evaluate medications/consider consulting pharmacy, Patient to call before getting OOB, Teach patient to arise slowly    Elimination Interventions: Bed/chair exit alarm, Call light in reach, Patient to call for help with toileting needs, Toileting schedule/hourly rounds    History of Falls Interventions: Bed/chair exit alarm, Door open when patient unattended, Evaluate medications/consider consulting pharmacy, Room close to nurse's station

## 2016-11-20 NOTE — Progress Notes (Signed)
TRANSFER - IN REPORT:    Verbal report received from Jenny(name) on Katelyn [name]  being received from ICU(unit) for routine progression of care      Report consisted of patient???s Situation, Background, Assessment and   Recommendations(SBAR).     Information from the following report(s) SBAR, Kardex, ED Summary, MAR, Accordion and Recent Results was reviewed with the receiving nurse.    Opportunity for questions and clarification was provided.      Assessment completed upon patient???s arrival to unit and care assumed.

## 2016-11-21 LAB — PLEASE READ & DOCUMENT PPD TEST IN 24 HRS
PPD: NEGATIVE Negative
mm Induration: 0 mm

## 2016-11-21 MED FILL — HYDRALAZINE 10 MG TAB: 10 mg | ORAL | Qty: 1

## 2016-11-21 MED FILL — LOSARTAN 50 MG TAB: 50 mg | ORAL | Qty: 2

## 2016-11-21 MED FILL — DEXAMETHASONE SODIUM PHOSPHATE 4 MG/ML IJ SOLN: 4 mg/mL | INTRAMUSCULAR | Qty: 1

## 2016-11-21 MED FILL — LEVETIRACETAM 500 MG TAB: 500 mg | ORAL | Qty: 1

## 2016-11-21 MED FILL — AMLODIPINE 10 MG TAB: 10 mg | ORAL | Qty: 1

## 2016-11-21 NOTE — Progress Notes (Signed)
[facility] OUTREACH NURSE PROGRESS REPORT      SUBJECTIVE: Called to assess patient secondary to transfer from ICU.      MEWS Score: 1 (11/21/16 1531)      LAB DATA:    Recent Labs      11/19/16   0610   NA  138   K  4.1   CL  102   CO2  26   AGAP  10   GLU  154*   BUN  25*   CREA  0.73   GFRAA  >60   GFRNA  >60   CA  8.0*        Recent Labs      11/19/16   0610   WBC  12.1*   HGB  13.9   HCT  41.8   PLT  192          OBJECTIVE: On arrival to room, I found patient to be laying in bed, watching TV with family .     General appearance: alert, cooperative, no distress, appears stated age  Neurologic: Mental status: Alert, oriented, thought content appropriate, affect: flat, slow to respond.       Pain Assessment  Pain Intensity 1: 0 (11/21/16 1225)        Patient Stated Pain Goal: 0                                 ASSESSMENT:  Pt resting in bed NAD.  Respirations even and unlabored.  Pt with no complaints at this time.        PLAN:  Continue to follow per outreach protocol.

## 2016-11-21 NOTE — Progress Notes (Signed)
Date of Outreach Update:  Katelyn [name] was seen and assessed.      MEWS Score: 1 (11/20/16 2358)  Vitals:    11/20/16 1400 11/20/16 1758 11/20/16 2240 11/20/16 2358   BP: 147/84 136/85  127/77   Pulse: 80 77 84 81   Resp: 18 18  18    Temp:  98.6 ??F (37 ??C)  98.7 ??F (37.1 ??C)   SpO2:  94% 93% 93%   Weight:       Height:             Pain Assessment  Pain Intensity 1: 0 (11/21/16 0232)        Patient Stated Pain Goal: 0      Previous Outreach assessment has been reviewed.  There have been no significant clinical changes since the completion of the last dated Outreach assessment.    Will continue to follow up per outreach protocol.    Signed By:   Charisse Klinefelter, RN    November 21, 2016 3:30 AM

## 2016-11-21 NOTE — Progress Notes (Signed)
Hospitalist Progress Note    11/21/2016  Admit Date: 11/18/2016  4:33 PM   NAME: Katelyn [name]   DOB:  10-24-1956   MRN:  [id number]   Attending: Clement Sayres, MD  PCP:  Dianna Rossetti, MD    SUBJECTIVE:   Previous documentation: 60 yo Pleasant and unfortunate Lady with PMH of Metastatic Melanoma follows up with Dr. Jenetta Loges presented to the ER with cc of left leg spasms and seizure like activity. Pt had cyber knife 11/29 at 1pm for metastatic brain mets. She felt at few tremors while getting the treatment but was able to finish the treatment. She left for home and while getting in car husband noticed she had decreased strength on left side. She then had witnessed tremors and spasms of left leg that looked like convulsions. She was brought in to the ER and CT head was done that showed Multiple hemorrhagic supratentorial and infratentorial metastases with??Intraorbital suspected metastasis.  Oncology and neurosurg consulted; nonsurgical.  Was placed in ICU 24 hours per protocol on cardene gtt but quickly weaned off.      12/1: VS stable.  General weakness noted      Review of Systems negative with exception of pertinent positives noted above  PHYSICAL EXAM     Visit Vitals   ??? BP 134/85 (BP 1 Location: Left arm, BP Patient Position: At rest)   ??? Pulse 67   ??? Temp 97.9 ??F (36.6 ??C)   ??? Resp 18   ??? Ht 5\' 7"  (1.702 m)   ??? Wt 66.6 kg (146 lb 12.8 oz)   ??? SpO2 94%   ??? Breastfeeding No   ??? BMI 22.99 kg/m2      Temp (24hrs), Avg:98.2 ??F (36.8 ??C), Min:97.7 ??F (36.5 ??C), Max:98.7 ??F (37.1 ??C)    Oxygen Therapy  O2 Sat (%): 94 % (11/21/16 0714)  Pulse via Oximetry: 70 beats per minute (11/20/16 0701)  O2 Device: Room air (11/20/16 0715)    Intake/Output Summary (Last 24 hours) at 11/21/16 1009  Last data filed at 11/20/16 1333   Gross per 24 hour   Intake                0 ml   Output              450 ml   Net             -450 ml      General: No acute distress????  Lungs:  CTA Bilaterally.    Heart:  Regular rate and rhythm,?? No murmur, rub, or gallop  Abdomen: Soft, Non distended, Non tender, Positive bowel sounds  Extremities: No cyanosis, clubbing or edema  Neurologic:?? No focal deficits    ASSESSMENT      Active Hospital Problems    Diagnosis Date Noted   ??? Memory difficulties 11/20/2016   ??? 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 HTN (hypertension) 08/18/2013     Plan:  ?? cyberknife treatment per oncology  ?? keppra for seizures  ?? Continue decadron  ?? PT to eval and treat      DVT Prophylaxis: scds    Signed By: Sherlynn Stalls, MD     November 21, 2016

## 2016-11-21 NOTE — Progress Notes (Signed)
Problem: Self Care Deficits Care Plan (Adult)  Goal: *Acute Goals and Plan of Care (Insert Text)  1. Patient will complete lower body bathing and dressing with modified independence and adaptive equipment as needed.   2. Patient will complete toileting with modified independence.   3. Patient will tolerate 20 minutes of OT treatment with 1-2 rest breaks to increase activity tolerance for ADLs.   4. Patient will complete functional transfers with modified independence and adaptive equipment as needed.     Timeframe: 7 visits     OCCUPATIONAL THERAPY: Initial Assessment and PM 11/21/2016  INPATIENT: Hospital Day: 4  Payor: BLUE CR[place] / [place] / Product Type: PPO /      NAME/AGE/GENDER: Katelyn [name] is a 60 y.o. female   PRIMARY DIAGNOSIS:  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[place]) 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[place]) 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[place])        ICD-10: Treatment Diagnosis:    ?? Generalized Muscle Weakness (M62.81)  ?? Other lack of cordination (R27.8)   Precautions/Allergies:    FALLS Adhesive tape-silicones      ASSESSMENT:     Katelyn [name] presents with decreased sensation and coordination on the left side. Pt with difficulty with attention and concentration. Pt with most recent decreased cognition and requiring assistance with basic ADLS in the home. Pt with decreased balance with tactile and verbal cues needed for the left side. Pt is at risk for falls due to decreased sensation in the left foot and decreased awareness of her deficits. Pt would benefit from continued OT for falls prevention and modified independence with ADLS with adaptive equipment as needed.    This section established at most recent assessment   PROBLEM LIST (Impairments causing functional limitations):  1. Decreased Strength  2. Decreased ADL/Functional Activities  3. Decreased Transfer Abilities  4. Decreased Ambulation Ability/Technique  5. Decreased Balance  6. Decreased Activity Tolerance   7. Decreased Cognition   INTERVENTIONS PLANNED: (Benefits and precautions of occupational therapy have been discussed with the patient.)  1. Activities of daily living training  2. Adaptive equipment training  3. Balance training  4. Clothing management  5. Cognitive training  6. Donning&doffing training  7. Sensory reintegration training  8. Therapeutic activity  9. Therapeutic exercise     TREATMENT PLAN: Frequency/Duration: Follow patient 3X/week to address above goals.  Rehabilitation Potential For Stated Goals: Good     RECOMMENDED REHABILITATION/EQUIPMENT: (at time of discharge pending progress): Due to the probability of continued deficits (see above) this patient will likely need continued skilled occupational therapy after discharge.  Equipment:   ? Pt needs a shower chair              OCCUPATIONAL PROFILE AND HISTORY:   History of Present Injury/Illness (Reason for Referral):  See H&P  Past Medical History/Comorbidities:   Katelyn [name]  has a past medical history of Cancer ([place]); Hypercholesterolemia; Hypertension; Nausea & vomiting; and Osteopenia. She also has no past medical history of Adverse effect of anesthesia; Difficult intubation; Malignant hyperthermia due to anesthesia; or Pseudocholinesterase deficiency.  Katelyn [name]  has a past surgical history that includes colonoscopy; gyn; other surgical; and orthopaedic (Left, 04/30/2016).  Social History/Living Environment:   Home Environment: Private residence  # Steps to Enter: 3  Rails to Enter: No  One/Two Story Residence: Two Wellsite geologist Steps: 12  Interior Rails: Right  Living Alone: No  Support Systems: Copy  Patient  Expects to be Discharged to:: Private residence  Current DME Used/Available at Home: None  Tub or Shower Type: Shower  Prior Level of Function/Work/Activity:  Decline in independence and safety with ADLS in the home     Number of Personal Factors/Comorbidities that affect the Plan of Care:  Expanded review of therapy/medical records (1-2):  MODERATE COMPLEXITY   ASSESSMENT OF OCCUPATIONAL PERFORMANCE::   Activities of Daily Living:      Completed full ADLS in the bathroom on the commode and assistance from OT for falls prevention and sequencing  Basic ADLs (From Assessment) Complex ADLs (From Assessment)   Basic ADL  Feeding: Setup  Oral Facial Hygiene/Grooming: Setup, Stand-by assistance  Bathing: Contact guard assistance, Additional time (seated and cues)  Upper Body Dressing: Setup, Stand-by assistance  Lower Body Dressing: Stand-by assistance, Setup, Additional time (seated for safety)  Toileting: Contact guard assistance, Adaptive equipment     Grooming/Bathing/Dressing Activities of Daily Living     Cognitive Retraining  Safety/Judgement: Awareness of environment           Toileting  Toileting Assistance: Stand-by assistance  Clothing Management: Contact guard assistance  Cues: Verbal cues provided     Functional Transfers  Toilet Transfer : Contact guard assistance (recommend walker even with short distances)     Bed/Mat Mobility  Supine to Sit: Supervision  Sit to Stand: Contact guard assistance  Bed to Chair: Contact guard assistance  Scooting: Stand-by asssistance  Cues: Tactile cues provided;Verbal cues provided       Most Recent Physical Functioning:   Gross Assessment:                  Posture:     Balance:  Sitting: Intact;Without support  Standing: Impaired;With support  Standing - Static: Constant support  Standing - Dynamic : Fair Bed Mobility:  Supine to Sit: Supervision  Scooting: Stand-by asssistance  Wheelchair Mobility:     Transfers:  Sit to Stand: Contact guard assistance  Stand to Sit: Contact guard assistance  Bed to Chair: Contact guard assistance                Patient Vitals for the past 6 hrs:   BP BP Patient Position SpO2 Pulse   11/21/16 1127 145/85 At rest 94 % 81       Mental Status  Neurologic State: Alert  Orientation Level: Oriented X4  Cognition: Follows commands   Perception: Cues to attend left visual field  Perseveration: No perseveration noted  Safety/Judgement: Awareness of environment                          Physical Skills Involved:  1. Balance  2. Strength  3. Activity Tolerance  4. Sensation  5. Gross Motor Control  6. Vision Cognitive Skills Affected (resulting in the inability to perform in a timely and safe manner):  1. Executive Function  2. Immediate Memory  3. Short Term Recall  4. Long Term Memory  5. Sustained Attention  6. Divided Attention  7. Comprehension Psychosocial Skills Affected:  1. Habits/Routines  2. Environmental Adaptation  3. Self-Awareness   Number of elements that affect the Plan of Care: 3-5:  MODERATE COMPLEXITY   CLINICAL DECISION MAKING:   [employer] AM-PAC??? ???6 Clicks???   Daily Activity Inpatient Short Form  How much help from another person does the patient currently need... Total A Lot A Little None   1.  Putting on and taking off  regular lower body clothing?    1    2    3    4    2.  Bathing (including washing, rinsing, drying)?    1    2    3    4    3.  Toileting, which includes using toilet, bedpan or urinal?    1    2    3    4    4.  Putting on and taking off regular upper body clothing?    1    2    3    4    5.  Taking care of personal grooming such as brushing teeth?    1    2    3    4    6.  Eating meals?    1    2    3    4    ?? 2007, [employer] of [employer], under license to [employer]. All rights reserved      Score:  Initial: 20, completed 12/2 Most Recent: X (Date: -- )    Interpretation of Tool:  Represents activities that are increasingly more difficult (i.e. Bed mobility, Transfers, Gait).   Score 24 23 22-20 19-15 14-10 9-7 6     Modifier CH CI CJ CK CL CM CN      ? Self Care:    [phone] - CURRENT STATUS: CJ - 20%-39% impaired, limited or restricted   [id number] - GOAL STATUS: CI - 1%-19% impaired, limited or restricted   [id number] - D/C STATUS:  ---------------To be determined---------------   Payor: BLUE CR[place] / [place] / Product Type: PPO /      Medical Necessity:     ?? Patient is expected to demonstrate progress in balance and functional technique to decrease assistance required with mobility and increase independence with ADLS.  Reason for Services/Other Comments:  ?? Patient continues to require skilled intervention due to decreased independence with ADLS, left decreased.   Use of outcome tool(s) and clinical judgement create a POC that gives a: MODERATE COMPLEXITY         TREATMENT:   (In addition to Assessment/Re-Assessment sessions the following treatments were rendered)     Pre-treatment Symptoms/Complaints:  0  Pain: Initial:   Pain Intensity 1: 0 0 Post Session:  0     Self Care: (10): Procedure(s) (per grid) utilized to improve and/or restore self-care/home management as related to dressing, bathing, toileting and grooming. Required moderate verbal and tactile cueing to facilitate activities of daily living skills and compensatory activities.    Braces/Orthotics/Lines/Etc:   ?? IV  ?? O2 Device: Room air  Treatment/Session Assessment:    ?? Response to Treatment:  receptive  ?? Interdisciplinary Collaboration:   o Warden/ranger  o Registered Nurse  ?? After treatment position/precautions:   o Up in chair  o Bed/Chair-wheels locked  o Call light within reach  o RN notified  o Visitors at bedside   ?? Compliance with Program/Exercises: Will assess as treatment progresses.  ?? Recommendations/Intent for next treatment session:  "Next visit will focus on advancements to more challenging activities and reduction in assistance provided".  Total Treatment Duration:  OT Patient Time In/Time Out  Time In: 1225  Time Out: [place], OT

## 2016-11-21 NOTE — Progress Notes (Signed)
Date of Outreach Update:  Katelyn [name] was seen and assessed.      MEWS Score: 1 (11/21/16 0535)  Vitals:    11/20/16 1902 11/20/16 2240 11/20/16 2358 11/21/16 0535   BP: 150/89  127/77 138/86   Pulse: 92 84 81 70   Resp: 20  18 20    Temp: 98.7 ??F (37.1 ??C)  98.7 ??F (37.1 ??C) 97.8 ??F (36.6 ??C)   SpO2: 91% 93% 93% 91%   Weight:       Height:             Pain Assessment  Pain Intensity 1: 0 (11/21/16 0232)        Patient Stated Pain Goal: 0      Previous Outreach assessment has been reviewed.  There have been no significant clinical changes since the completion of the last dated Outreach assessment.    Will continue to follow up per outreach protocol.    Signed By:   Charisse Klinefelter, RN    November 21, 2016 6:26 AM

## 2016-11-21 NOTE — Progress Notes (Signed)
Date of Outreach Update:  Katelyn [name] was seen and assessed.      MEWS Score: 1 (11/21/16 1127)  Vitals:    11/20/16 2358 11/21/16 0535 11/21/16 0714 11/21/16 1127   BP: 127/77 138/86 134/85 145/85   Pulse: 81 70 67 81   Resp: 18 20 18 17    Temp: 98.7 ??F (37.1 ??C) 97.8 ??F (36.6 ??C) 97.9 ??F (36.6 ??C) 98.8 ??F (37.1 ??C)   SpO2: 93% 91% 94% 94%   Weight:       Height:             Pain Assessment  Pain Intensity 1: 0 (11/21/16 0232)        Patient Stated Pain Goal: 0      Previous Outreach assessment has been reviewed.  There have been no significant clinical changes since the completion of the last dated Outreach assessment.    Will continue to follow up per outreach protocol.    Signed By:   Billey Co, RN    November 21, 2016 12:10 PM

## 2016-11-22 LAB — PLEASE READ & DOCUMENT PPD TEST IN 48 HRS
PPD: NEGATIVE Negative
mm Induration: 0 mm

## 2016-11-22 LAB — PLEASE READ & DOCUMENT PPD TEST IN 72 HRS: PPD: NEGATIVE Negative

## 2016-11-22 LAB — ECHOCARDIOGRAM COMPLETE 2D W DOPPLER W COLOR: Left Ventricular Ejection Fraction: 63

## 2016-11-22 MED ORDER — DOCUSATE SODIUM 100 MG CAP
100 mg | Freq: Every day | ORAL | Status: DC
Start: 2016-11-22 — End: 2016-11-23
  Administered 2016-11-22 – 2016-11-23 (×2): via ORAL

## 2016-11-22 MED FILL — DEXAMETHASONE SODIUM PHOSPHATE 4 MG/ML IJ SOLN: 4 mg/mL | INTRAMUSCULAR | Qty: 1

## 2016-11-22 MED FILL — HYDRALAZINE 10 MG TAB: 10 mg | ORAL | Qty: 1

## 2016-11-22 MED FILL — LOSARTAN 50 MG TAB: 50 mg | ORAL | Qty: 2

## 2016-11-22 MED FILL — DOCUSATE SODIUM 100 MG CAP: 100 mg | ORAL | Qty: 1

## 2016-11-22 MED FILL — LEVETIRACETAM 500 MG TAB: 500 mg | ORAL | Qty: 1

## 2016-11-22 MED FILL — AMLODIPINE 10 MG TAB: 10 mg | ORAL | Qty: 1

## 2016-11-22 NOTE — Progress Notes (Signed)
[facility]IT[facility] OUTREACH NURSE PROGRESS REPORT      SUBJECTIVE: Called to assess patient secondary to transfer from unit.      MEWS Score: 1 (11/22/16 0746)  Vitals:    11/21/16 2029 11/21/16 2259 11/22/16 0326 11/22/16 0746   BP: 145/86 117/70 134/83 124/78   Pulse: 74 68 70 74   Resp: 20 18 20 19    Temp: 97.7 ??F (36.5 ??C) 98.5 ??F (36.9 ??C) 98.5 ??F (36.9 ??C) 98.3 ??F (36.8 ??C)   SpO2: 94% 93% 95% 94%   Weight:       Height:          EKG: .     LAB DATA:    No results for input(s): NA, K, CL, CO2, AGAP, GLU, BUN, CREA, GFRAA, GFRNA, CA, MG, PHOS, ALB, TBIL, TP, ALB, GLOB, AGRAT, ALT, GPT in the last 72 hours.     No results for input(s): WBC, HGB, HCT, PLT, HGBEXT, HCTEXT, PLTEXT in the last 72 hours.       OBJECTIVE: On arrival to room, I found patient to be lying in bed watching tv.       Pain Assessment  Pain Intensity 1: 0 (11/22/16 0225)        Patient Stated Pain Goal: 0                                 ASSESSMENT:  Patient alert and oriented. Denies pain. States she has some persistent numbness in her left foot but unchanged. Blurry vision at times but unchanged. Negative for facial droop, equal grip strength, negative for ataxia. Stool softener ordered due to absence of bowel movement.     PLAN:  Will continue to follow up per protocol. Please call with any concerns.

## 2016-11-22 NOTE — Progress Notes (Signed)
Hospitalist Progress Note    11/22/2016  Admit Date: 11/18/2016  4:33 PM   NAME: Katelyn [name]   DOB:  November 17, 1956   MRN:  [id number]   Attending: Clement Sayres, MD  PCP:  Dianna Rossetti, MD    SUBJECTIVE:     Katelyn [name] is a [phone] with metastatic melanoma admitted with multiple areas of supra- and infra-tentorial hemorrhage after recent cyber-knife treatment. NSGY deemed non-surgical. Initially in ICU for 24hrs. Was briefly on cardene gtt, but weaned off. Was transferred to floor on 12/1. Still with some left sided weakness, but overall improving. No other complaints.      Review of Systems negative with exception of pertinent positives noted above      PHYSICAL EXAM       Visit Vitals   ??? BP 124/78 (BP 1 Location: Right arm, BP Patient Position: At rest)   ??? Pulse 74   ??? Temp 98.3 ??F (36.8 ??C)   ??? Resp 19   ??? Ht 5\' 7"  (1.702 m)   ??? Wt 66.6 kg (146 lb 12.8 oz)   ??? SpO2 94%   ??? Breastfeeding No   ??? BMI 22.99 kg/m2      Temp (24hrs), Avg:98.3 ??F (36.8 ??C), Min:97.7 ??F (36.5 ??C), Max:98.8 ??F (37.1 ??C)    Oxygen Therapy  O2 Sat (%): 94 % (11/22/16 0746)  Pulse via Oximetry: 70 beats per minute (11/20/16 0701)  O2 Device: Room air (11/22/16 0746)    Intake/Output Summary (Last 24 hours) at 11/22/16 0945  Last data filed at 11/22/16 0748   Gross per 24 hour   Intake              240 ml   Output                0 ml   Net              240 ml          General: No acute distress.  Head:  Atraumatic Normocephalic.  Lungs:  CTA Bilaterally.   CVS:  RRR  Abdomen: Soft, NTTP  MSK:  Spontaneously moves extremities.  Neurologic:?? AOx3. L sided weakness  Psychiatry:      No anxiety/Depression      Recent Results (from the past 24 hour(s))   PLEASE READ & DOCUMENT PPD TEST IN 48 HRS    Collection Time: 11/21/16  7:00 PM   Result Value Ref Range    PPD Negative Negative    mm Induration 0 mm         Imaging /Procedures /Studies   MRI BRAIN W WO CONT   Final Result   IMPRESSION:       1. Multiple hemorrhagic intracranial metastases including an enlarging right   parietal metastasis which has hemorrhaged since October 30, 2016.      2. 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      CT HEAD WO CONT   Final Result   IMPRESSION:      Multiple hemorrhagic brain masses. No significant change compared to CT from   yesterday.      XR CHEST PORT   Final Result   IMPRESSION: Minimal left lower lobe infiltrate/atelectasis         CT HEAD WO CONT   Final Result   IMPRESSION:      1. Multiple hemorrhagic supratentorial and infratentorial metastases.        2. 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      The findings were called to Dr. Delora Fuel on 11/18/2016 at 5:05 PM by Dr. Velta Addison.    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[place]) ICD-10-CM: G40.109  ICD-9-CM: 345.50  11/19/2016 - Present Yes        * (Principal)Metastatic melanoma of brain [facility]) ICD-10-CM: C79.31  ICD-9-CM: 198.3  11/18/2016 - Present Yes        HTN (hypertension) (Ch[place]) ICD-10-CM: I10  ICD-9-CM: 401.9  08/18/2013 - Present Yes                  Plan:  - cyberknife treatment likely tomorrow  - continue keppra for seizures  - continue decadron  - BP well controlled on norvasc and hyzaar  - have asked PT to re-assess today    DVT Prophylaxis: SCDs  Dispo: likely home with [facility] tomorrow after cyberknife treatment    Deland Pretty, MD

## 2016-11-23 ENCOUNTER — Inpatient Hospital Stay: Payer: Managed Care, Other (non HMO) | Attending: Internal Medicine

## 2016-11-23 ENCOUNTER — Inpatient Hospital Stay: Payer: Managed Care, Other (non HMO)

## 2016-11-23 MED ORDER — LORAZEPAM 1 MG TAB
1 mg | ORAL_TABLET | Freq: Every evening | ORAL | 0 refills | Status: DC | PRN
Start: 2016-11-23 — End: 2017-04-20

## 2016-11-23 MED ORDER — HYDROCODONE-ACETAMINOPHEN 7.5 MG-325 MG TAB
ORAL_TABLET | ORAL | 0 refills | Status: DC | PRN
Start: 2016-11-23 — End: 2017-04-14

## 2016-11-23 MED ORDER — LEVETIRACETAM 500 MG TAB
500 mg | ORAL_TABLET | Freq: Two times a day (BID) | ORAL | 11 refills | Status: AC
Start: 2016-11-23 — End: ?

## 2016-11-23 MED FILL — AMLODIPINE 10 MG TAB: 10 mg | ORAL | Qty: 1

## 2016-11-23 MED FILL — LOSARTAN 50 MG TAB: 50 mg | ORAL | Qty: 2

## 2016-11-23 MED FILL — HYDRALAZINE 10 MG TAB: 10 mg | ORAL | Qty: 1

## 2016-11-23 MED FILL — DOCUSATE SODIUM 100 MG CAP: 100 mg | ORAL | Qty: 1

## 2016-11-23 MED FILL — LORAZEPAM 2 MG/ML IJ SOLN: 2 mg/mL | INTRAMUSCULAR | Qty: 1

## 2016-11-23 MED FILL — DEXAMETHASONE SODIUM PHOSPHATE 4 MG/ML IJ SOLN: 4 mg/mL | INTRAMUSCULAR | Qty: 1

## 2016-11-23 MED FILL — HYDRALAZINE 20 MG/ML IJ SOLN: 20 mg/mL | INTRAMUSCULAR | Qty: 1

## 2016-11-23 MED FILL — LEVETIRACETAM 500 MG TAB: 500 mg | ORAL | Qty: 1

## 2016-11-23 NOTE — Discharge Summary (Signed)
Hospitalist Discharge Summary     Patient ID:  Katelyn [name]  [id number]  60 y.o.  01/28/1956  Admit date: 11/18/2016  4:33 PM  Discharge date and time: 11/23/2016  Attending: Neva SeatAdnan Aqeel, MD  PCP:  Wynona NeatLinda L Giambalvo, MD  Treatment Team: Attending Provider: Neva SeatAdnan Aqeel, MD; Consulting Provider: Real ConsSharif S Khan, MD; Care Manager: Orvilla Cornwallheresa G Cox, RN    Principal Diagnosis Metastatic melanoma of brain [facility](HCC)   Hospital Problems as of 11/23/2016  Date Reviewed: 10/30/2016          Codes Class Noted - Resolved POA    Memory difficulties ICD-10-CM: R41.3  ICD-9-CM: 780.93  11/20/2016 - Present Yes        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HCC) ICD-10-CM: I61.9  ICD-9-CM: 431  11/19/2016 - Present Yes        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HCC) ICD-10-CM: G40.109  ICD-9-CM: 345.50  11/19/2016 - Present Yes        * (Principal)Metastatic melanoma of brain [facility](HCC) ICD-10-CM: C79.31  ICD-9-CM: 198.3  11/18/2016 - Present Yes        HTN (hypertension) (Chronic) ICD-10-CM: I10  ICD-9-CM: 401.9  08/18/2013 - Present Yes              HPI: 60 yo Pleasant and unfortunate Lady with PMH of Metastatic Melanoma follows up with Dr. Mikal Planeyar presented to the ER with cc of Left leg spasms and seizure like activity. Pt had cyber knife Rx today at 1pm for metastatic brain mets. She felt at few tremors while getting the treatment but was able to finish the treatment. She left for home and while getting in car husband noticed she had decreased strength on left side. She then had witnessed tremors and spasms of left leg that looked like convulsions. She was brought in to the ER and CT head was done that showed Multiple hemorrhagic supratentorial and infratentorial metastases with Intraorbital suspected metastasis. She was seen by teleneuro and ER discussed the case with Neurosurgery on call and Oncologist on call who recommended medical management. She was Given IV ativan and her symptoms resolved. Cardene gtt ordered in the ER and  hospitalist asked to assess and admit pt.      Pt states she feels a bit better, no more tremors or seizure like activity noted. Denies headache, visual changes, speech difficulty, limb paralysis, facial numbness or weakness. No nausea or vomiting reported. Per husband and daughters, she is improving slowly. Husband concerned if she can get the right kind of expertise and treatment [facility]. He asked about other options and I told him [facility] has neurologist on staff which we do not have. He would like to speak with neurosurgeon and then make a decision regarding staying here or [facility].        Hospital Course: Admitted on 11/29 with bleeding from her brain mets after her first cyperknife treatment. CT showed multiple areas of supra- and infra-tentorial hemorrhage after recent cyber-knife treatment. NSGY deemed non-surgical. Initially in ICU for 24hrs. Was briefly on cardene gtt, but weaned off. Was transferred to floor on 12/1. Still with some left sided weakness, but overall improving. [facility] Her husband will transport to her cyberknife appt this afternoon. I have spoken to Oncology, who will follow up with her later this week for a more definitive chemo plan.      Significant Diagnostic Studies:   Labs: Results:  Chemistry No results for input(s): GLU, NA, K, CL, CO2, BUN, CREA, CA, AGAP, BUCR, TBIL, GPT, AP, TP, ALB, GLOB, AGRAT in the last 72 hours.   CBC w/Diff No results for input(s): WBC, RBC, HGB, HCT, PLT, GRANS, LYMPH, EOS, HGBEXT, HCTEXT, PLTEXT, HGBEXT, HCTEXT, PLTEXT in the last 72 hours.   Cardiac Enzymes No results for input(s): CPK, CKND1, MYO in the last 72 hours.    No lab exists for component: CKRMB, TROIP   Coagulation No results for input(s): PTP, INR, APTT in the last 72 hours.    No lab exists for component: INREXT, INREXT    Lipid Panel Lab Results   Component Value Date/Time    Cholesterol, total 162 10/14/2016 09:48 AM    HDL  Cholesterol 63 10/14/2016 09:48 AM    LDL, calculated 91 10/14/2016 09:48 AM    VLDL, calculated 8 10/14/2016 09:48 AM    Triglyceride 42 10/14/2016 09:48 AM      BNP No results for input(s): BNPP in the last 72 hours.   Liver Enzymes No results for input(s): TP, ALB, TBIL, AP, SGOT, GPT in the last 72 hours.    No lab exists for component: DBIL   Thyroid Studies Lab Results   Component Value Date/Time    TSH 3.250 08/27/2015 08:08 AM            Discharge Exam:  Visit Vitals   ??? BP (!) 134/94 (BP 1 Location: Right arm, BP Patient Position: At rest)   ??? Pulse 74   ??? Temp 97.9 ??F (36.6 ??C)   ??? Resp 19   ??? Ht 5\' 7"  (1.702 m)   ??? Wt 66.6 kg (146 lb 12.8 oz)   ??? SpO2 94%   ??? Breastfeeding No   ??? BMI 22.99 kg/m2     General appearance: alert, cooperative, NAD  Lungs: clear to auscultation bilaterally  Heart: regular rate and rhythm  Abdomen: soft, NTTP  Extremities: no cyanosis or edema  Neurologic: CN intact, L sided weakness    Disposition: [facility] with [facility]  Discharge Condition: stable  Patient Instructions:   Current Discharge Medication List      START taking these medications    Details   HYDROcodone-acetaminophen (NORCO) 7.5-325 mg per tablet Take 1 Tab by mouth every four (4) hours as needed. Max Daily Amount: 6 Tabs.  Qty: 20 Tab, Refills: 0      levETIRAcetam (KEPPRA) 500 mg tablet Take 1 Tab by mouth two (2) times a day.  Qty: 60 Tab, Refills: 11      LORazepam (ATIVAN) 1 mg tablet Take 1 Tab by mouth nightly as needed (insomnia). Max Daily Amount: 1 mg.  Qty: 30 Tab, Refills: 0         CONTINUE these medications which have NOT CHANGED    Details   dabrafenib (TAFINLAR) 75 mg cap Take 2 Caps by mouth two (2) times a day.  Qty: 120 Cap, Refills: 3      trametinib (MEKINIST) 2 mg tab Take 1 Tab by mouth daily.  Qty: 30 Tab, Refills: 3      dexamethasone (DECADRON) 4 mg tablet Take 1 Tab by mouth two (2) times daily (with meals).  Qty: 60 Tab, Refills: 2      simvastatin (ZOCOR) 20 mg tablet Take 1 Tab by mouth  nightly.  Qty: 90 Tab, Refills: 3    Associated Diagnoses: Hyperlipidemia, unspecified hyperlipidemia type      amLODIPine (NORVASC) 10 mg tablet Take  1 Tab by mouth nightly.  Qty: 90 Tab, Refills: 3    Associated Diagnoses: Essential hypertension      losartan-hydroCHLOROthiazide (HYZAAR) 100-25 mg per tablet Take 1 Tab by mouth daily.  Qty: 90 Tab, Refills: 3    Associated Diagnoses: Essential hypertension      multivitamin (ONE A DAY) tablet Take 1 Tab by mouth daily.      TURMERIC ROOT EXTRACT PO Take  by mouth daily.      ferrous sulfate (IRON) 325 mg (65 mg iron) tablet Take  by mouth Daily (before breakfast).      fluticasone (FLONASE) 50 mcg/actuation nasal spray 2 Sprays by Both Nostrils route nightly.      calcium-cholecalciferol, d3, (CALCIUM 600 + D) 600-125 mg-unit tab Take  by mouth three (3) times daily.      cetirizine (ZYRTEC) 10 mg tablet Take  by mouth.         STOP taking these medications       aspirin delayed-release 81 mg tablet Comments:   Reason for Stopping:               Activity: PT/OT per Home Health  Diet: Cardiac Diet    Follow-up  -   Oncology as scheduled    Time spent to discharge patient: 18min    Signed:  Deland Pretty, MD  11/23/2016  9:29 AM

## 2016-11-23 NOTE — Discharge Summary (Addendum)
Hospitalist Discharge Summary     Patient ID:  Katelyn CallCynthia S Golightly  [id number]  60 y.o.  10/02/1956  Admit date: 11/18/2016  4:33 PM  Discharge date and time: 11/23/2016  Attending: Neva SeatAdnan Aqeel, MD  PCP:  Wynona NeatLinda L Giambalvo, MD  Treatment Team: Attending Provider: Neva SeatAdnan Aqeel, MD; Consulting Provider: Real ConsSharif S Khan, MD; Care Manager: Orvilla Cornwallheresa G Cox, RN    Principal Diagnosis Metastatic melanoma of brain [facility](HCC)   Hospital Problems as of 11/23/2016  Date Reviewed: 10/30/2016          Codes Class Noted - Resolved POA    Memory difficulties ICD-10-CM: R41.3  ICD-9-CM: 780.93  11/20/2016 - Present Yes        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HCC) ICD-10-CM: I61.9  ICD-9-CM: 431  11/19/2016 - Present Yes        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 Mikal Planeyar presented to the ER with cc of Left leg spasms and seizure like activity. Pt had cyber knife Rx today at 1pm for metastatic brain mets. She felt at few tremors while getting the treatment but was able to finish the treatment. She left for home and while getting in car husband noticed she had decreased strength on left side. She then had witnessed tremors and spasms of left leg that looked like convulsions. She was brought in to the ER and CT head was done that showed Multiple hemorrhagic supratentorial and infratentorial metastases with Intraorbital suspected metastasis. She was seen by teleneuro and ER discussed the case with Neurosurgery on [name] and Oncologist on [name] who recommended medical management. She was Given IV ativan and her symptoms resolved. Cardene gtt  ordered in the ER and hospitalist asked to assess and admit pt.      Pt states she feels a bit better, no more tremors or seizure like activity noted. Denies headache, visual changes, speech difficulty, limb paralysis, facial numbness or weakness. No nausea or vomiting reported. Per husband and daughters, she is improving slowly. Husband concerned if she can get the right kind of expertise and [facility]. He asked about other options and I told him [facility] has neurologist on staff which we do not have. He would like to speak with neurosurgeon and then make a decision regarding staying here [facility]        Hospital Course: Admitted on 11/29 with bleeding from her brain mets after her first cyperknife treatment. CT showed multiple areas of supra- and infra-tentorial hemorrhage after recent cyber-knife treatment. NSGY deemed non-surgical. Initially in ICU for 24hrs. Was briefly on cardene gtt, but weaned off. Was transferred to floor on 12/1. Still with some left sided weakness, but overall improving. [facility] Her husband will transport to her cyberknife appt this afternoon. I have spoken to Oncology, who will follow up with her later this week for a more definitive chemo plan.      Significant Diagnostic Studies:   Labs: Results:  Chemistry No results for input(s): GLU, NA, K, CL, CO2, BUN, CREA, CA, AGAP, BUCR, TBIL, GPT, AP, TP, ALB, GLOB, AGRAT in the last 72 hours.   CBC w/Diff No results for input(s): WBC, RBC, HGB, HCT, PLT, GRANS, LYMPH, EOS, HGBEXT, HCTEXT, PLTEXT, HGBEXT, HCTEXT, PLTEXT in the last 72 hours.   Cardiac Enzymes No results for input(s): CPK, CKND1, MYO in the last 72 hours.    No lab exists for component: CKRMB, TROIP   Coagulation No results for input(s): PTP, INR, APTT in the last 72 hours.    No lab exists for component: INREXT, INREXT    Lipid Panel Lab Results   Component Value Date/Time     Cholesterol, total 162 10/14/2016 09:48 AM    HDL Cholesterol 63 10/14/2016 09:48 AM    LDL, calculated 91 10/14/2016 09:48 AM    VLDL, calculated 8 10/14/2016 09:48 AM    Triglyceride 42 10/14/2016 09:48 AM      BNP No results for input(s): BNPP in the last 72 hours.   Liver Enzymes No results for input(s): TP, ALB, TBIL, AP, SGOT, GPT in the last 72 hours.    No lab exists for component: DBIL   Thyroid Studies Lab Results   Component Value Date/Time    TSH 3.250 08/27/2015 08:08 AM            Discharge Exam:  Visit Vitals   ??? BP (!) 134/94 (BP 1 Location: Right arm, BP Patient Position: At rest)   ??? Pulse 74   ??? Temp 97.9 ??F (36.6 ??C)   ??? Resp 19   ??? Ht 5\' 7"  (1.702 m)   ??? Wt 66.6 kg (146 lb 12.8 oz)   ??? SpO2 94%   ??? Breastfeeding No   ??? BMI 22.99 kg/m2     General appearance: alert, cooperative, NAD  Lungs: clear to auscultation bilaterally  Heart: regular rate and rhythm  Abdomen: soft, NTTP  Extremities: no cyanosis or edema  Neurologic: CN intact, L sided weakness    Disposition: [facility] with [facility]  Discharge Condition: stable  Patient Instructions:   Current Discharge Medication List      START taking these medications    Details   HYDROcodone-acetaminophen (NORCO) 7.5-325 mg per tablet Take 1 Tab by mouth every four (4) hours as needed. Max Daily Amount: 6 Tabs.  Qty: 20 Tab, Refills: 0      levETIRAcetam (KEPPRA) 500 mg tablet Take 1 Tab by mouth two (2) times a day.  Qty: 60 Tab, Refills: 11      LORazepam (ATIVAN) 1 mg tablet Take 1 Tab by mouth nightly as needed (insomnia). Max Daily Amount: 1 mg.  Qty: 30 Tab, Refills: 0         CONTINUE these medications which have NOT CHANGED    Details   dabrafenib (TAFINLAR) 75 mg cap Take 2 Caps by mouth two (2) times a day.  Qty: 120 Cap, Refills: 3      trametinib (MEKINIST) 2 mg tab Take 1 Tab by mouth daily.  Qty: 30 Tab, Refills: 3      dexamethasone (DECADRON) 4 mg tablet Take 1 Tab by mouth two (2) times daily (with meals).  Qty: 60 Tab, Refills: 2       simvastatin (ZOCOR) 20 mg tablet Take 1 Tab by mouth nightly.  Qty: 90 Tab, Refills: 3    Associated Diagnoses: Hyperlipidemia, unspecified hyperlipidemia type      amLODIPine (NORVASC) 10 mg tablet Take  1 Tab by mouth nightly.  Qty: 90 Tab, Refills: 3    Associated Diagnoses: Essential hypertension      losartan-hydroCHLOROthiazide (HYZAAR) 100-25 mg per tablet Take 1 Tab by mouth daily.  Qty: 90 Tab, Refills: 3    Associated Diagnoses: Essential hypertension      multivitamin (ONE A DAY) tablet Take 1 Tab by mouth daily.      TURMERIC ROOT EXTRACT PO Take  by mouth daily.      ferrous sulfate (IRON) 325 mg (65 mg iron) tablet Take  by mouth Daily (before breakfast).      fluticasone (FLONASE) 50 mcg/actuation nasal spray 2 Sprays by Both Nostrils route nightly.      calcium-cholecalciferol, d3, (CALCIUM 600 + D) 600-125 mg-unit tab Take  by mouth three (3) times daily.      cetirizine (ZYRTEC) 10 mg tablet Take  by mouth.         STOP taking these medications       aspirin delayed-release 81 mg tablet Comments:   Reason for Stopping:               Activity: PT/OT per Home Health  Diet: Cardiac Diet    Follow-up  -   Oncology as scheduled    Time spent to discharge patient: 56min    Signed:  Deland Pretty, MD  11/23/2016  9:29 AM

## 2016-11-23 NOTE — Progress Notes (Addendum)
Met with pt and spouse at bedside pt is alert and able to answer questions, states lives with spouse in 2 level home, no current DME in home, 15 steps inside home, independent prior to admission.    Per MD request pt seen about HH, agreeable to [facility] for PT/OT, referral completed.    Care Management Interventions  PCP Verified by CM: Yes  Mode of Transport at Discharge:  (spouse)  Transition of Care Consult (CM Consult): [place]: Yes  Current Support Network: Lives with Spouse, Own Home  Confirm Follow Up Transport: Family  Plan discussed with Pt/Family/Caregiver: Yes  Freedom of Choice Offered: Yes  Discharge Location  Discharge Placement: Home with home health      11/23/16-- 1050 received request to have ST added to home health orders, completed.

## 2016-11-23 NOTE — Progress Notes (Signed)
Discharge instructions ,medication side effects sheet, follow up appointment and prescriptions reviewed and explained to the patient. Patient verbalizes understanding of instructions. A copy of discharge instructions and prescriptions  have been given to patient.  Opportunity for questions provided.  Patient requesting prescription for Ativan for home.. Dr Wonda Horner aware and patient will be discharged after Dr Dellia Beckwith writes prescription

## 2016-11-23 NOTE — Progress Notes (Signed)
[place]  Face to Face Encounter    Patient???s Name: Katelyn [name]    Date of Birth: 30-Nov-1956    Ordering Physician: Dr Dellia Beckwith    Primary Diagnosis: Metastatic melanoma of brain [facility])    Date of Face to Face:   11/23/2016                                  Face to Face Encounter findings are related to primary reason for home care:   yes.     1. I certify that the patient needs intermittent care as follows: physical therapy: strengthening, stretching/ROM, transfer training, gait/stair training, balance training and pt/caregiver education  occupational therapy:  ADL safety (ie. cooking, bathing, dressing), ROM and pt/caregiver education    2. I certify that this patient is homebound, that is: 1) patient requires the use of a none device, special transportation, or assistance of another to leave the home; or 2) patient's condition makes leaving the home medically contraindicated; and 3) patient has a normal inability to leave the home and leaving the home requires considerable and taxing effort.  Patient may leave the home for infrequent and short duration for medical reasons, and occasional absences for non-medical reasons. Homebound status is due to the following functional limitations: Patient with poor safety awareness and is at risk for falls without assistance of another person and the use of an assistive device.  Patient with poor ambulation endurance limiting their safe ability to ascend/descend the required number of steps to leave the home.    3. I certify that this patient is under my care and that I, or a nurse practitioner or physician???s assistant, or clinical nurse specialist, or certified nurse midwife, working with me, had a Face-to-Face Encounter that meets the physician Face-to-Face Encounter requirements.  The following are the clinical findings from the [place] encounter that support the need for skilled services and is a summary of the encounter:     See hospital chart       Riccardo Dubin, RN  11/23/2016      THE FOLLOWING TO BE COMPLETED BY THE COMMUNITY PHYSICIAN:    I concur with the findings described above from the F2F encounter that this patient is homebound and in need of a skilled service.    Certifying Physician: _____________________________________      Printed Certifying Physician Name: _____________________________________    Date: _________________

## 2016-11-23 NOTE — Progress Notes (Signed)
SPEECH PATHOLOGY NOTE:    Plans for discharge home today.  Discussed recommendations for [facility] speech therapy for cognition with care manager.    Serita Grammes MS, CCC-SLP

## 2016-11-23 NOTE — Progress Notes (Signed)
Problem: Falls - Risk of  Goal: *Absence of Falls  Document Schmid Fall Risk and appropriate interventions in the flowsheet.   Outcome: Progressing Towards Goal  Fall Risk Interventions:  Mobility Interventions: Communicate number of staff needed for ambulation/transfer, OT consult for ADLs, Patient to call before getting OOB, PT Consult for mobility concerns, PT Consult for assist device competence, Strengthening exercises (ROM-active/passive), Utilize walker, cane, or other assitive device, Utilize gait belt for transfers/ambulation         Medication Interventions: Assess postural VS orthostatic hypotension, Evaluate medications/consider consulting pharmacy, Patient to call before getting OOB, Teach patient to arise slowly, Utilize gait belt for transfers/ambulation    Elimination Interventions: Call light in reach, Patient to call for help with toileting needs, Toilet paper/wipes in reach, Toileting schedule/hourly rounds    History of Falls Interventions: Consult care management for discharge planning, Door open when patient unattended, Evaluate medications/consider consulting pharmacy, Investigate reason for fall, Room close to nurse's station, Utilize gait belt for transfer/ambulation

## 2016-11-24 NOTE — Progress Notes (Signed)
This note will not be viewable in [place].  Transition of Care Discharge Follow-up Questionnaire   Date/Time of Call:   November 24, 2016 2:10PM   What was the patient hospitalized for? Patient hospitalized for Metastatic melanoma of brain.             Does the patient understand his/her diagnosis and/or treatment and what happened during the hospitalization?     Patient's spouse states understanding of patient diagnosis and treatment during hospitalization.   Did the patient receive discharge instructions? Yes     Review any discharge instructions (see notes in [facility]).  Ask patient if they understand these.  Do they have any questions?     Patient's spouse states understanding of patient discharge instructions, patient's spouse states no questions.   Were home services ordered (nursing, PT, OT, ST, etc.)? Yes,home health services ordered (PT/OT/Speech Therapy).       If so, has the first visit occurred? If not, why? (Assist with coordination of services if necessary.) No, Care Coordinator informed patient's spouse that home health will make contact within 24 to 48 hours of hospital discharge. Care Coordinator advised patient' spouse to call if assistance is needed. Contact information provided.         Was any DME ordered? No durable medical equipment ordered.     If so, has it been received?  If not, why?  (Assist with coordination of arranging DME orders if necessary.) NA         Complete a review of all medications (new, continued and discontinued meds per the D/C instructions and medication tab in [facility]). Care Coordinator reviewed all medications with patient's spouse per connect care, three new medications prescribed.  HYDROcodone-acetaminophen (NORCO) 7.5-325 mg per tablet Take 1 Tab by mouth every four (4) hours as needed. Max Daily Amount: 6 Tabs.  Qty: 20 Tab, Refills: 0   ??   levETIRAcetam (KEPPRA) 500 mg tablet Take 1 Tab by mouth two (2) times a day.  Qty: 60 Tab, Refills: 11   ??    LORazepam (ATIVAN) 1 mg tablet Take 1 Tab by mouth nightly as needed (insomnia). Max Daily Amount: 1 mg.  Qty: 30 Tab, Refills: 0   ??   Discontinued  Medications-   aspirin delayed-release 81 mg tablet Comments:   Reason for Stopping:    ?? ??        Were all new prescriptions filled?  If not, why?  (Assist with obtainment of medications if necessary.) Yes         Does the patient understand the purpose and dosing instructions for all medications?  (If patient has questions, provide explanation and education.) Patient's spouse states understanding of patient current medications and dosing instructions. Care Coordinator educated patient's spouse on the importance of medication compliance and reporting medication side effects to PCP/Specialist.     Does the patient have any problems in performing ADL???s? (If patient is unable to perform ADLs ??? what is the limiting factor(s)?  Do they have a support system that can assist? If no support system is present, discuss possible assistance that they may be able to obtain.) Patient's spouse states patient is independent with ADL's and ambulation but requires assistance. Patient's spouse states patient is doing okay.              Does the patient have all follow-up appointments scheduled?      7 day f/up with PCP?    7-14 day f/up with specialist?  If f/up has not been made ??? what actions has the care coordinator made to accomplish this?    Has transportation been arranged?    [facility] follow-up should be within 7 days of discharge; all others should have PCP follow-up within 7 days of discharge; follow-ups with other specialists should be within 7-14 days of discharge.) Care Coordinator educated patient's spouse on the importance of scheduling follow up appointment with patient's  PCP within 7 days of hospital discharge. Patient's spouse declined assistance from Care Coordinator to obtain follow up appointment with patient's PCP.     11/25/2016 11:00 AM [facility]     11/27/2016 12:00 PM Greer Ee, MD [facility]      Patient's spouse states patient has no transportation needs.     Any other questions or concerns expressed by the patient?     No further needs identified, patient's spouse instructed to call Care Coordinator if further questions or concerns arise. Patient's spouse informed of the importance of compliance with follow up appointments with PCP/Specialist.   Schedule next appointment with [facility] Coordinator or refer to RN Case Manager/Social Services Case Manager per the workflow guidelines.    When is care coordinator???s next follow-up call scheduled?    If referred for CCM ??? what RN care manager was the referral assigned? NA          NA      NA       TOC Call Completed By: Vivien Presto, LPN  [facility]  Care Coordinator

## 2016-11-25 ENCOUNTER — Encounter: Primary: Family Medicine

## 2016-11-25 ENCOUNTER — Encounter: Admit: 2016-11-25 | Discharge: 2016-11-25 | Payer: BLUE CROSS/BLUE SHIELD | Primary: Family Medicine

## 2016-11-25 NOTE — Progress Notes (Signed)
Received a email request from [facility] that they needed BRAF Mutation test results for the approval for Mekinist and [place].  I faxed the requested information to [phone] and received confirmation of receipt.  It is in patient's chart.

## 2016-11-27 ENCOUNTER — Ambulatory Visit
Admit: 2016-11-27 | Discharge: 2016-11-27 | Payer: BLUE CROSS/BLUE SHIELD | Attending: Internal Medicine | Primary: Family Medicine

## 2016-11-27 DIAGNOSIS — C787 Secondary malignant neoplasm of liver and intrahepatic bile duct: Secondary | ICD-10-CM

## 2016-11-27 NOTE — Progress Notes (Signed)
I have received the approval from [employer] [employer] for the [place] and [place] and both are good from 11/26/16/ to 11/26/17.  Scanned into media.

## 2016-11-27 NOTE — Progress Notes (Signed)
PA Approved 11/26/16-11/26/17. However must fill through [facility]. Case [id number]. For both [employer].

## 2016-11-27 NOTE — Patient Instructions (Signed)
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  We have a clinical trial that treats this with either the B-RAF inhibitors or the immunotherapy.  We didn't want you to be on the immunotherapy because it takes so long to start working whereas the B-RAF inhibitor would work immediately.    -We will send you home with the samples we obtained of the [place] and [place] is taken as 2 tablets Twice a day.  The Mekinist must be kept in the refrigerator and is taken as one tablet daily.       Follow Up:  -Follow up with Dr. Jenetta Loges after you get a plan from [place] has been discussed and given to patient: n/a        -------------------------------------------------------------------------------------------------------------------  Please call our office at [phone] if you have any  of the following symptoms:   ?? Fever of 100.5 or greater  ?? Chills  ?? Shortness of breath  ?? Swelling or pain in one leg    After office hours an answering service is available and will contact a provider for emergencies or if you are experiencing any of the above symptoms.    ? Patient did express an interest in My Chart.  My Chart log in information explained on the after visit summary printout at the [place] desk.    Salley Slaughter. Bobby Rumpf, RN

## 2016-11-27 NOTE — Progress Notes (Signed)
[facility]: Office Visit Established Patient    Chief Complaint:    Chief Complaint   Patient presents with   ??? Follow-up     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          History of Present Illness:  Katelyn [name] is a 60 y.o. 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  She has a history of osteopenia, hypertension, hypercholesterolemia, and melanoma of the back s/p excision in 2008, sentinel node was negative but she is unsure what T stage.  She initially presented to her PCP on 10/14/16 reporting a one month history of intermittent upper abdominal pain with loss of appetite and right scapular pain radiating to her right arm.  Ultrasound of the abdomen, obtained on 10/15/16, identified multiple lesions in the liver, concerning for metastatic disease, as well as small hypoechoic areas near the tail of the pancreas, possibly adenopathy.  Further imaging was performed with CT scan of the chest, this showed hepatic and pulmonary lesions highly concerning for metastatic disease.  Lung nodules measured up to 12 mm and liver lesions measured up to 25 mm.  She was referred for urgent oncologic consultation for evaluation and management options.  We recommended biopsy of a liver lesion and PET/CT for staging.  Her liver biopsy was definitive for metastatic melanoma, and PET confirmed the multiple lesions in the liver, lungs and lymph nodes.  There were also suspicious changes in the visualized portion of the brain.  We ordered a STAT MRI which confirmed multiple brain lesions, prompting [place] to initiate steroids and refer to radiation oncology for radiation, SRS vs whole brain.      She returns for follow-up.  Since our last visit, she completed radiation to the brain lesions.  The day of the first CyberKnife treatment, she was noted to have significant tremor, almost to the point of suggesting some possible partial seizure activity.  She had significant left sided  weakness as well.  She was admitted and started on high dose steroids, antihypertensives, and Keppra.  MRI brain confirmed the known hemorrhagic brain lesions, but Neurosurgery did not feel there was anything surgical.  She was able to complete the course of SRS and has had some neurologic recovery, with no more seizure-like activity.  Her pathology returned showing a BRAF mutation, we were trying to screen her for the [id number] trial, but with the extent of her disease and the neurologic changes, we felt that we did not have time to wait for immunotherapy effect and recommended upfront dabrafenib/trametinib.  She is planning to go to [facility] on Monday to discuss options as well.  Eating relatively well, weight is stable.          Review of Systems:  Constitutional: Positive for malaise/fatigue.   HENT: Negative.   Eyes: Negative.   Respiratory: Negative.   Cardiovascular: Negative.   Gastrointestinal: Negative.   Genitourinary: Negative.   Musculoskeletal: Negative.   Skin: Negative.   Neurological: Positive for confusion, weakness, tremors.   Endo/Heme/Allergies: Negative.   Psychiatric/Behavioral: Negative.   All other systems reviewed and are negative.     Allergies   Allergen Reactions   ??? Adhesive Tape-Silicones Rash     Past Medical History:   Diagnosis Date   ??? Cancer ([place])     melanoma - back   ??? Hypercholesterolemia    ??? Hypertension     managed with meds   ??? Nausea & vomiting    ???  Osteopenia      Past Surgical History:   Procedure Laterality Date   ??? HX COLONOSCOPY     ??? HX GYN      c-section x 5   ??? HX ORTHOPAEDIC Left 04/30/2016    knee   ??? HX OTHER SURGICAL      melanoma removed from back     Family History   Problem Relation Age of Onset   ??? Hypertension Mother    ??? Elevated Lipids Mother    ??? Elevated Lipids Father    ??? Hypertension Father    ??? Arthritis-osteo Father      spine   ??? Heart Disease Father    ??? Breast Cancer Other    ??? Diabetes Sister      Social History     Social History    ??? Marital status: MARRIED     Spouse name: N/A   ??? Number of children: N/A   ??? Years of education: N/A     Occupational History   ??? Not on file.     Social History Main Topics   ??? Smoking status: Never Smoker   ??? Smokeless tobacco: Never Used   ??? Alcohol use No   ??? Drug use: Not on file   ??? Sexual activity: Not on file     Other Topics Concern   ??? Not on file     Social History Narrative     Current Outpatient Prescriptions   Medication Sig Dispense Refill   ??? levETIRAcetam (KEPPRA) 500 mg tablet Take 1 Tab by mouth two (2) times a day. 60 Tab 11   ??? LORazepam (ATIVAN) 1 mg tablet Take 1 Tab by mouth nightly as needed (insomnia). Max Daily Amount: 1 mg. 30 Tab 0   ??? dexamethasone (DECADRON) 4 mg tablet Take 1 Tab by mouth two (2) times daily (with meals). 60 Tab 2   ??? simvastatin (ZOCOR) 20 mg tablet Take 1 Tab by mouth nightly. 90 Tab 3   ??? amLODIPine (NORVASC) 10 mg tablet Take 1 Tab by mouth nightly. 90 Tab 3   ??? losartan-hydroCHLOROthiazide (HYZAAR) 100-25 mg per tablet Take 1 Tab by mouth daily. 90 Tab 3   ??? HYDROcodone-acetaminophen (NORCO) 7.5-325 mg per tablet Take 1 Tab by mouth every four (4) hours as needed. Max Daily Amount: 6 Tabs. 20 Tab 0   ??? dabrafenib (TAFINLAR) 75 mg cap Take 2 Caps by mouth two (2) times a day. 120 Cap 3   ??? trametinib (MEKINIST) 2 mg tab Take 1 Tab by mouth daily. 30 Tab 3   ??? multivitamin (ONE A DAY) tablet Take 1 Tab by mouth daily.     ??? TURMERIC ROOT EXTRACT PO Take  by mouth daily.     ??? ferrous sulfate (IRON) 325 mg (65 mg iron) tablet Take  by mouth Daily (before breakfast).     ??? fluticasone (FLONASE) 50 mcg/actuation nasal spray 2 Sprays by Both Nostrils route nightly.     ??? calcium-cholecalciferol, d3, (CALCIUM 600 + D) 600-125 mg-unit tab Take  by mouth three (3) times daily.     ??? cetirizine (ZYRTEC) 10 mg tablet Take  by mouth.         OBJECTIVE:  Visit Vitals   ??? BP (!) 137/98  Comment: standing   ??? Pulse (!) 102   ??? Temp 98.4 ??F (36.9 ??C) (Oral)   ??? Resp 22    ??? Ht '5\' 7"'  (1.702 m)   ???  Wt 134 lb (60.8 kg)   ??? SpO2 96%   ??? BMI 20.99 kg/m2       Physical Exam:  Constitutional: Well developed, well nourished female in no acute distress, sitting comfortably on the examination table.    HEENT: Normocephalic and atraumatic. Oropharynx is clear, mucous membranes are moist.  Sclerae anicteric. Neck supple without JVD. No thyromegaly present.    Lymph node   No palpable submandibular, cervical, supraclavicular, axillary lymph nodes.   Skin Warm and dry.  No bruising and no rash noted.  No erythema.  No pallor.    Respiratory Lungs are clear to auscultation bilaterally without wheezes, rales or rhonchi, normal air exchange without accessory muscle use.    CVS Normal rate, regular rhythm and normal S1 and S2.  No murmurs, gallops, or rubs.   Abdomen Soft, nontender and nondistended, normoactive bowel sounds.  No palpable mass.  No hepatosplenomegaly.   Neuro Grossly nonfocal with no obvious sensory or motor deficits.   MSK Normal range of motion in general.  No edema and no tenderness.   Psych Appropriate mood and affect.      Labs:  Recent Results (from the past 336 hour(s))   PLEASE READ & DOCUMENT PPD TEST IN 24 HRS    Collection Time: 11/20/16  7:00 PM   Result Value Ref Range    PPD Negative Negative    mm Induration 0 mm   PLEASE READ & DOCUMENT PPD TEST IN 48 HRS    Collection Time: 11/21/16  7:00 PM   Result Value Ref Range    PPD Negative Negative    mm Induration 0 mm   PLEASE READ & DOCUMENT PPD TEST IN 72 HRS    Collection Time: 11/22/16  5:59 PM   Result Value Ref Range    PPD negative Negative    mm Induration  0 mm        Imaging:  PET/CT: 10/29/2016  ??  INDICATION: Liver lesions. History melanoma in 2008.  ??  TECHNIQUE: After oral administration of gastroview and intravenous  administration of 15.44 mCi of F18 FDG, noncontrast CT images were obtained for  attenuation correction and for fusion with emission PET images. A series of   overlapping emission PET images were then obtained beginning 60 minutes after  injection of FDG. The area imaged spanned the region from the skull base to the  mid thighs.  ??  COMPARISON: CT chest, abdomen, and pelvis 10/15/2016  ??  FINDINGS:   NECK/CHEST:  Abnormal hyperdense brain masses are seen located in the left posterior temporal  lobe seen CT image 30 measuring 2.7 cm x 2.4 cm, and in the right frontal lobe  on image 28 measuring 1.2 cm x 0.8 cm. It is uncertain whether these represent  subacute spontaneous hemorrhages, hyperdense intracranial metastases, or  metastases which have subacutely hemorrhage. Mild abnormal PET activity is  favored to correspond to these findings both seen on PET image 302 and therefore  hyperdense intracranial metastases or intracranial metastases which have  subacutely hemorrhage are favored. These should be further assessed with a  contrasted brain MRI. Additional edema seen in the right parietal lobe on image  21 without a definite hyperdense abnormality.  ??  No abnormal activity is seen in the neck. No adenopathy is seen.  ??  Abnormal activity is seen associated with prevascular lymph nodes the largest of  which is seen on image 109 measuring 1.3 cm x 1.1 cm concerning for metastatic  mediastinal adenopathy. In addition, abnormal activity is seen associated with  the previously described bilateral pulmonary nodules with the largest nodule  located in the medial right lower lobe on image 138 measuring 1.9 cm x 1.4 cm  consistent with pulmonary metastases. A subcutaneous lesion is seen in the left  back soft tissues on image 140 measuring 1.4 cm x 0.9 cm which demonstrates mild  activity raising concern for potential subcutaneous lesion such as recurrent  melanoma.  ??  ABDOMEN/PELVIS:  Abnormal activity is seen associated with previous as described hepatic lesions  consistent with hepatic metastases. The largest lesion is felt to reside in the   right lobe seen on image 151 measuring 3.5 cm x 3.2 cm. No worrisome activity is  otherwise seen. There is a 5 mm subcutaneous lesion in the right anterior  abdominal wall seen on CT image 202. No abnormal activity is seen associated  with this lesion although activity is likely underestimate given its small size.  An additional melanoma cannot be excluded. A gallstone is seen within a  noninflamed gallbladder.  ??  Only physiologic activity is seen in the pelvis.  ??  LEGS:  No hypermetabolic lesion or worrisome subcutaneous kidneys lesion is seen in the  lower extremities.  ??  IMPRESSION  IMPRESSION:   1. Small subcutaneous lesions in the left posterior back, and right anterior  abdominal wall whose appearance is concerning for recurrent melanoma.  ??  2. Abnormal activity associated with prior hepatic and pulmonary lesions  consistent with metastatic lesions. In addition, abnormal activity is seen  associated with mediastinal lymph nodes consistent with additional nodal  metastatic disease. Lastly, there are abnormal hyperdense masses in the brain  which demonstrate mild activity raising concern for intracranial metastases.  Given the hyperdense appearance, the possibility of subacute hemorrhage cannot  be excluded. These should be further assessed with a contrasted MRI of the  Brain.    Results for orders placed during the hospital encounter of 10/15/16   CT CHEST ABD PELV W CONT    Narrative CT SCAN OF THE CHEST WITH CONTRAST: 10/15/2016  Indication: Loss of appetite  Comparison: None   100cc of Isovue 350  was used in the detection of thoracic pathology.  Dose reduction techniques used: Automated exposure control, adjustment of the  mAs and/or kVp according to patient size, standardized low-dose protocol, and/or  iterative reconstruction technique.  Findings: Tracheobronchial tree is intact. There are multiple noncalcified  bilateral lung nodules of which the largest is in the left upper lobe measuring   12 mm. Thyroid is unremarkable. There is no significant mediastinal adenopathy.  Bony structures and the soft tissues are intact    CT scan the abdomen with contrast:    There are multiple low-density lesions throughout the hepatic parenchyma of  which the largest is in the left lobe measuring 20 mm x 25 mm. Gallstones are  present. Adrenal glands, pancreas, spleen and spleen is unremarkable. Right  kidney is unremarkable. There are multiple subcentimeter left renal cyst. Aorta,  small and large bowel, appendix, soft tissues, and the bony structures are  unremarkable. There is no significant adenopathy.    CT scan of pelvis with contrast:    The uterus, adnexal structures, bladder, soft tissues and the bony structures  are unremarkable for mass lesions.      Impression IMPRESSION:: Gallstones, small left renal cysts, hepatic and pulmonary  metastatic lesions     Pathology:  ASSESSMENT:    ICD-10-CM ICD-9-CM    1. 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 (HCC) C78.7 197.7      172.9    2. Malignant neoplasm metastatic to lung, unspecified laterality (HCC) C78.00 197.0    3. 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     Pertinent old records were reviewed.    Melanoma: metastatic to liver, lungs, nodes, and also concern for brain lesions on CT imaging from PET.  History of melanoma in 2008 resected from the back.  Symptoms including fatigue, malaise, anorexia, weight loss, blurry vision, mild intermittent confusion.  Her liver biopsy was definitive for metastatic melanoma, and PET confirmed the multiple lesions in the liver, lungs and lymph nodes.  There were also suspicious changes in the visualized portion of the brain.  We ordered a STAT MRI which confirmed multiple brain lesions, prompting [place] to initiate steroids and refer to radiation oncology for radiation, SRS vs whole brain.      She returns for follow-up.  Since our last visit, she has completed radiation to the brain lesions.  The day of the first CyberKnife treatment, she was noted to have significant tremor, almost to the point of suggesting some possible partial seizure activity.  She had significant left sided weakness as well.  She was admitted and started on high dose steroids, antihypertensives, and Keppra.  MRI brain confirmed the known hemorrhagic brain lesions, but Neurosurgery did not feel there was anything surgical.  She was able to complete the course of SRS and has had some neurologic recovery, with no more seizure-like activity.  Her pathology returned showing a BRAF mutation, we were trying to screen her for the [id number] trial, but with the extent of her disease and the neurologic changes, we felt that we did not have time to wait for immunotherapy effect and recommended upfront dabrafenib/trametinib.  She is planning to go to [facility] on Monday to discuss options as well.  I recommend that she have the dabrafenib and trametinib ready, but that she wait for her appointment next week to start, as she will be ineligible for any first line protocols if she starts treatment beforehand.  We also  spent a great deal of time discussing long-term prognosis, which is limited by her metastatic disease which is incurable, but I explained that with BRAF/MEK inhibitors and with immunotherapy down the line, the potential for treatment response and prolongation of life was certainly realistic.  All questions were asked and answered to the best of my ability.  In all, I spent 75 minutes in the care of Katelyn [name] today, over 50% of which was in direct counseling and coordination of care.  F/u after [place] appointment for systemic therapy discussion, assuming she starts [place], MD  [facility] [place] [address]  Office : [phone]  Fax : [phone]

## 2016-11-30 ENCOUNTER — Encounter: Payer: BLUE CROSS/BLUE SHIELD | Primary: Family Medicine

## 2016-12-01 ENCOUNTER — Encounter: Primary: Family Medicine

## 2016-12-02 ENCOUNTER — Encounter: Primary: Family Medicine

## 2016-12-03 ENCOUNTER — Encounter: Primary: Family Medicine

## 2016-12-07 ENCOUNTER — Encounter: Admit: 2016-12-07 | Discharge: 2016-12-07 | Payer: BLUE CROSS/BLUE SHIELD | Primary: Family Medicine

## 2016-12-08 ENCOUNTER — Encounter: Admit: 2016-12-08 | Discharge: 2016-12-08 | Payer: BLUE CROSS/BLUE SHIELD | Primary: Family Medicine

## 2016-12-10 ENCOUNTER — Encounter: Admit: 2016-12-10 | Discharge: 2016-12-10 | Payer: BLUE CROSS/BLUE SHIELD | Primary: Family Medicine

## 2016-12-11 ENCOUNTER — Encounter: Primary: Family Medicine

## 2016-12-11 ENCOUNTER — Encounter: Admit: 2016-12-11 | Discharge: 2016-12-11 | Payer: BLUE CROSS/BLUE SHIELD | Primary: Family Medicine

## 2016-12-14 ENCOUNTER — Encounter: Primary: Family Medicine

## 2016-12-15 ENCOUNTER — Encounter: Primary: Family Medicine

## 2016-12-16 ENCOUNTER — Encounter: Admit: 2016-12-16 | Discharge: 2016-12-16 | Payer: BLUE CROSS/BLUE SHIELD | Primary: Family Medicine

## 2016-12-16 ENCOUNTER — Encounter: Primary: Family Medicine

## 2016-12-17 ENCOUNTER — Inpatient Hospital Stay: Admit: 2016-12-17 | Payer: BLUE CROSS/BLUE SHIELD | Primary: Family Medicine

## 2016-12-17 ENCOUNTER — Encounter: Payer: BLUE CROSS/BLUE SHIELD | Primary: Family Medicine

## 2016-12-17 ENCOUNTER — Encounter: Admit: 2016-12-17 | Discharge: 2016-12-17 | Payer: BLUE CROSS/BLUE SHIELD | Primary: Family Medicine

## 2016-12-17 ENCOUNTER — Encounter: Primary: Family Medicine

## 2016-12-17 DIAGNOSIS — K769 Liver disease, unspecified: Secondary | ICD-10-CM

## 2016-12-17 LAB — CBC WITH AUTOMATED DIFF
ABS. BASOPHILS: 0 10*3/uL (ref 0.0–0.2)
ABS. EOSINOPHILS: 0 10*3/uL (ref 0.0–0.8)
ABS. LYMPHOCYTES: 1.2 10*3/uL (ref 0.5–4.6)
ABS. MONOCYTES: 0.5 10*3/uL (ref 0.1–1.3)
ABS. NEUTROPHILS: 4 10*3/uL (ref 1.7–8.2)
ABSOLUTE NRBC: 0.12 10*3/uL (ref 0.0–0.2)
BASOPHILS: 0 % (ref 0.0–2.0)
EOSINOPHILS: 0 % — ABNORMAL LOW (ref 0.5–7.8)
HCT: 41.8 % (ref 35.8–46.3)
HGB: 14.5 g/dL (ref 11.7–15.4)
LYMPHOCYTES: 20 % (ref 13–44)
MCH: 30 PG (ref 26.1–32.9)
MCHC: 34.7 g/dL (ref 31.4–35.0)
MCV: 86.5 FL (ref 79.6–97.8)
MONOCYTES: 8 % (ref 4.0–12.0)
MPV: 8.1 FL — ABNORMAL LOW (ref 10.8–14.1)
NEUTROPHILS: 71 % (ref 43–78)
PLATELET: 188 10*3/uL (ref 150–450)
RBC: 4.83 M/uL (ref 4.05–5.25)
RDW: 17.2 % — ABNORMAL HIGH (ref 11.9–14.6)
WBC: 5.7 10*3/uL (ref 4.3–11.1)

## 2016-12-17 LAB — METABOLIC PANEL, COMPREHENSIVE
A-G Ratio: 0.9 — ABNORMAL LOW (ref 1.2–3.5)
ALT (SGPT): 59 U/L (ref 12–65)
AST (SGOT): 32 U/L (ref 15–37)
Albumin: 2.8 g/dL — ABNORMAL LOW (ref 3.2–4.6)
Alk. phosphatase: 101 U/L (ref 50–136)
Anion gap: 11 mmol/L (ref 7–16)
BUN: 21 MG/DL (ref 8–23)
Bilirubin, total: 0.6 MG/DL (ref 0.2–1.1)
CO2: 28 mmol/L (ref 21–32)
Calcium: 8.7 MG/DL (ref 8.3–10.4)
Chloride: 97 mmol/L — ABNORMAL LOW (ref 98–107)
Creatinine: 0.63 MG/DL (ref 0.6–1.0)
GFR est AA: >60 mL/min/{1.73_m2} (ref >60)
GFR est non-AA: >60 mL/min/{1.73_m2} (ref >60)
Globulin: 3.2 g/dL (ref 2.3–3.5)
Glucose: 136 mg/dL — ABNORMAL HIGH (ref 65–100)
Potassium: 2.8 mmol/L — CL (ref 3.5–5.1)
Protein, total: 6 g/dL — ABNORMAL LOW (ref 6.3–8.2)
Sodium: 136 mmol/L (ref 136–145)

## 2016-12-17 LAB — MAGNESIUM: Magnesium: 2.1 mg/dL (ref 1.8–2.4)

## 2016-12-18 ENCOUNTER — Encounter: Admit: 2016-12-18 | Discharge: 2016-12-18 | Payer: BLUE CROSS/BLUE SHIELD | Primary: Family Medicine

## 2016-12-21 ENCOUNTER — Encounter: Primary: Family Medicine

## 2016-12-22 ENCOUNTER — Encounter: Payer: BLUE CROSS/BLUE SHIELD | Primary: Family Medicine

## 2016-12-22 ENCOUNTER — Encounter: Admit: 2016-12-22 | Discharge: 2016-12-22 | Payer: BLUE CROSS/BLUE SHIELD | Primary: Family Medicine

## 2016-12-22 NOTE — Progress Notes (Signed)
Spouse called today to let us know that they only had tonight's dose of medicine left.  When speaking to him previously, he told me they didn't need it now that they still has plenty of samples.  [facility] to [facility] they just put a note on their website [facility] nothing about triaging it.  I have faxed the script to [facility] @ [phone] and requested that the order be RUSHed.

## 2016-12-22 NOTE — Progress Notes (Signed)
Patient is out of medication that she needs [facility]. Otila Kluver will work on getting them a refill.    Kathia Covington Eustaquio Maize Akiera Allbaugh, Therapist, sports, Copywriter, advertising

## 2016-12-23 ENCOUNTER — Encounter: Primary: Family Medicine

## 2016-12-24 ENCOUNTER — Encounter

## 2016-12-24 ENCOUNTER — Encounter: Admit: 2016-12-24 | Discharge: 2016-12-24 | Payer: BLUE CROSS/BLUE SHIELD | Primary: Family Medicine

## 2016-12-24 NOTE — Progress Notes (Signed)
Per Tom @ [facility] @ [phone] [employer] was shipped 12/23/16 for delivery 12/24/16.

## 2016-12-25 ENCOUNTER — Encounter

## 2016-12-28 ENCOUNTER — Ambulatory Visit
Admit: 2016-12-28 | Discharge: 2016-12-28 | Payer: PRIVATE HEALTH INSURANCE | Attending: Internal Medicine | Primary: Family Medicine

## 2016-12-28 ENCOUNTER — Inpatient Hospital Stay: Admit: 2016-12-28 | Payer: PRIVATE HEALTH INSURANCE | Primary: Family Medicine

## 2016-12-28 DIAGNOSIS — C7931 Secondary malignant neoplasm of brain: Secondary | ICD-10-CM

## 2016-12-28 LAB — CBC WITH AUTOMATED DIFF
ABS. BASOPHILS: 0 10*3/uL (ref 0.0–0.2)
ABS. EOSINOPHILS: 0 10*3/uL (ref 0.0–0.8)
ABS. LYMPHOCYTES: 1.8 10*3/uL (ref 0.5–4.6)
ABS. MONOCYTES: 0.6 10*3/uL (ref 0.1–1.3)
ABS. NEUTROPHILS: 5.3 10*3/uL (ref 1.7–8.2)
ABSOLUTE NRBC: 0.08 10*3/uL (ref 0.0–0.2)
BASOPHILS: 1 % (ref 0.0–2.0)
EOSINOPHILS: 1 % (ref 0.5–7.8)
HCT: 39.5 % (ref 35.8–46.3)
HGB: 13.6 g/dL (ref 11.7–15.4)
LYMPHOCYTES: 23 % (ref 13–44)
MCH: 30 PG (ref 26.1–32.9)
MCHC: 34.4 g/dL (ref 31.4–35.0)
MCV: 87.2 FL (ref 79.6–97.8)
MONOCYTES: 8 % (ref 4.0–12.0)
MPV: 8.5 FL — ABNORMAL LOW (ref 10.8–14.1)
NEUTROPHILS: 68 % (ref 43–78)
PLATELET: 330 10*3/uL (ref 150–450)
RBC: 4.53 M/uL (ref 4.05–5.25)
RDW: 18 % — ABNORMAL HIGH (ref 11.9–14.6)
WBC: 7.8 10*3/uL (ref 4.3–11.1)

## 2016-12-28 LAB — METABOLIC PANEL, COMPREHENSIVE
A-G Ratio: 0.7 — ABNORMAL LOW (ref 1.2–3.5)
ALT (SGPT): 33 U/L (ref 12–65)
AST (SGOT): 26 U/L (ref 15–37)
Albumin: 2.5 g/dL — ABNORMAL LOW (ref 3.2–4.6)
Alk. phosphatase: 97 U/L (ref 50–136)
Anion gap: 9 mmol/L (ref 7–16)
BUN: 15 MG/DL (ref 8–23)
Bilirubin, total: 0.5 MG/DL (ref 0.2–1.1)
CO2: 30 mmol/L (ref 21–32)
Calcium: 8.4 MG/DL (ref 8.3–10.4)
Chloride: 99 mmol/L (ref 98–107)
Creatinine: 0.62 MG/DL (ref 0.6–1.0)
GFR est AA: >60 mL/min/{1.73_m2} (ref >60)
GFR est non-AA: >60 mL/min/{1.73_m2} (ref >60)
Globulin: 3.8 g/dL — ABNORMAL HIGH (ref 2.3–3.5)
Glucose: 115 mg/dL — ABNORMAL HIGH (ref 65–100)
Potassium: 2.9 mmol/L — CL (ref 3.5–5.1)
Protein, total: 6.3 g/dL (ref 6.3–8.2)
Sodium: 138 mmol/L (ref 136–145)

## 2016-12-28 LAB — LD: LD: 450 U/L — ABNORMAL HIGH (ref 100–190)

## 2016-12-28 LAB — MAGNESIUM: Magnesium: 1.9 mg/dL (ref 1.8–2.4)

## 2016-12-28 MED ORDER — ESCITALOPRAM 10 MG TAB
10 mg | ORAL_TABLET | Freq: Every day | ORAL | 5 refills | Status: DC
Start: 2016-12-28 — End: 2017-02-24

## 2016-12-28 NOTE — Patient Instructions (Addendum)
Patient Instructions From Your Nurse    Reason for Visit:  Follow up visit     Plan:  -Continue taking the Tafinlar and [place] are due for another echo. (this is a 1 month echo and then we will just continue them every 3 months)  -Your PET scan is due 3 months after starting treatment     Follow Up:  Follow up with Dr. Jenetta Loges or NP in a month    Recent Lab Results:  Recent Results (from the past 12 hour(s))   CBC WITH AUTOMATED DIFF    Collection Time: 12/28/16 10:42 AM   Result Value Ref Range    WBC 7.8 4.3 - 11.1 K/uL    RBC 4.53 4.05 - 5.25 M/uL    HGB 13.6 11.7 - 15.4 g/dL    HCT 39.5 35.8 - 46.3 %    MCV 87.2 79.6 - 97.8 FL    MCH 30.0 26.1 - 32.9 PG    MCHC 34.4 31.4 - 35.0 g/dL    RDW 18.0 (H) 11.9 - 14.6 %    PLATELET 330 150 - 450 K/uL    MPV 8.5 (L) 10.8 - 14.1 FL    ABSOLUTE NRBC 0.08 0.0 - 0.2 K/uL    DF AUTOMATED      NEUTROPHILS 68 43 - 78 %    LYMPHOCYTES 23 13 - 44 %    MONOCYTES 8 4.0 - 12.0 %    EOSINOPHILS 1 0.5 - 7.8 %    BASOPHILS 1 0.0 - 2.0 %    ABS. NEUTROPHILS 5.3 1.7 - 8.2 K/UL    ABS. LYMPHOCYTES 1.8 0.5 - 4.6 K/UL    ABS. MONOCYTES 0.6 0.1 - 1.3 K/UL    ABS. EOSINOPHILS 0.0 0.0 - 0.8 K/UL    ABS. BASOPHILS 0.0 0.0 - 0.2 K/UL   METABOLIC PANEL, COMPREHENSIVE    Collection Time: 12/28/16 10:42 AM   Result Value Ref Range    Sodium 138 136 - 145 mmol/L    Potassium PENDING mmol/L    Chloride 99 98 - 107 mmol/L    CO2 30 21 - 32 mmol/L    Anion gap 9 7 - 16 mmol/L    Glucose 115 (H) 65 - 100 mg/dL    BUN 15 8 - 23 MG/DL    Creatinine 0.62 0.6 - 1.0 MG/DL    GFR est AA >60 >60 ml/min/1.85m    GFR est non-AA >60 >60 ml/min/1.755m   Calcium 8.4 8.3 - 10.4 MG/DL    Bilirubin, total 0.5 0.2 - 1.1 MG/DL    ALT (SGPT) 33 12 - 65 U/L    AST (SGOT) 26 15 - 37 U/L    Alk. phosphatase 97 50 - 136 U/L    Protein, total 6.3 6.3 - 8.2 g/dL    Albumin 2.5 (L) 3.2 - 4.6 g/dL    Globulin 3.8 (H) 2.3 - 3.5 g/dL    A-G Ratio 0.7 (L) 1.2 - 3.5     MAGNESIUM    Collection Time: 12/28/16 10:42 AM    Result Value Ref Range    Magnesium 1.9 1.8 - 2.4 mg/dL   LD    Collection Time: 12/28/16 10:42 AM   Result Value Ref Range    LD 450 (H) 100 - 190 U/L           Care plan has been discussed and given to patient: n/a        -------------------------------------------------------------------------------------------------------------------  Please call  our office at [phone] if you have any  of the following symptoms:   ?? Fever of 100.5 or greater  ?? Chills  ?? Shortness of breath  ?? Swelling or pain in one leg    After office hours an answering service is available and will contact a provider for emergencies or if you are experiencing any of the above symptoms.    ? Patient did express an interest in My Chart.  My Chart log in information explained on the after visit summary printout at the [facility] desk.    Salley Slaughter. Bobby Rumpf, RN

## 2016-12-28 NOTE — Progress Notes (Signed)
[facility]: Office Visit Established Patient    Chief Complaint:    Chief Complaint   Patient presents with   ??? Follow-up     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         History of Present Illness:  Katelyn [name] is a 61 y.o. 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  She has a history of osteopenia, hypertension, hypercholesterolemia, and melanoma of the back s/p excision in 2008, sentinel node was negative but she is unsure what T stage.  She initially presented to her PCP on 10/14/16 reporting a one month history of intermittent upper abdominal pain with loss of appetite and right scapular pain radiating to her right arm.  Ultrasound of the abdomen, obtained on 10/15/16, identified multiple lesions in the liver, concerning for metastatic disease, as well as small hypoechoic areas near the tail of the pancreas, possibly adenopathy.  Further imaging was performed with CT scan of the chest, this showed hepatic and pulmonary lesions highly concerning for metastatic disease.  Lung nodules measured up to 12 mm and liver lesions measured up to 25 mm.  She was referred for urgent oncologic consultation, and liver biopsy was definitive for metastatic melanoma, and PET confirmed the multiple lesions in the liver, lungs and lymph nodes.  There were also suspicious changes in the visualized portion of the brain.  We ordered a STAT MRI which confirmed multiple brain lesions, prompting [place] to initiate steroids and refer to radiation oncology.  She completed CyberKnife radiation to the brain lesions, but the day of the first CyberKnife treatment, she was noted to have significant tremor, almost to the point of suggesting some possible partial seizure activity.  She had significant left sided weakness as well.  She was admitted and started on high dose steroids, antihypertensives, and Keppra.  MRI brain confirmed  the known hemorrhagic brain lesions, but Neurosurgery did not feel there was anything surgical.  She was able to complete the course of SRS and had some neurologic recovery, with no more seizure-like activity.  Her pathology returned showing a BRAF mutation, we were trying to screen her for the [phone] trial, but with the extent of her disease and the neurologic changes, we felt that we did not have time to wait for immunotherapy effect and recommended upfront dabrafenib/trametinib.  She went to [facility] and they agreed with the plan.  We also spent a great deal of time discussing long-term prognosis, which is limited by her metastatic disease which is incurable, but I explained that with BRAF/MEK inhibitors and with immunotherapy down the line, the potential for treatment response and prolongation of life was certainly realistic.            Here for follow-up.  Now on dabrafenib/trametinib for about 4 weeks.  She is clearly improved, although she continues to have some neurocognitive deficits.  She can remember conversations much better, although she does not feel her memory is back to baseline.  One of the most notable issues is hypersensitivity to light and sound, this is making it difficult for her to be out of the house.  She also notes mood lability including crying spells.  Tremors have improved considerably.  No seizure activity.  No issues with dabrafenib/trametinib, some decreased appetite and taste changes, but able to eat whatever she wants.         Review of Systems:  Constitutional: Positive for malaise/fatigue.   HENT: Negative.  Eyes: Negative.   Respiratory: Negative.   Cardiovascular: Negative.   Gastrointestinal: Negative.   Genitourinary: Negative.   Musculoskeletal: Negative.   Skin: Negative.   Neurological: Positive for confusion, weakness.   Endo/Heme/Allergies: Negative.   Psychiatric/Behavioral: Negative.   All other systems reviewed and are negative.     Allergies   Allergen Reactions    ??? Adhesive Tape-Silicones Rash     Past Medical History:   Diagnosis Date   ??? Cancer ([place])     melanoma - back   ??? Hypercholesterolemia    ??? Hypertension     managed with meds   ??? Nausea & vomiting    ??? Osteopenia      Past Surgical History:   Procedure Laterality Date   ??? HX COLONOSCOPY     ??? HX GYN      c-section x 5   ??? HX ORTHOPAEDIC Left 04/30/2016    knee   ??? HX OTHER SURGICAL      melanoma removed from back     Family History   Problem Relation Age of Onset   ??? Hypertension Mother    ??? Elevated Lipids Mother    ??? Elevated Lipids Father    ??? Hypertension Father    ??? Arthritis-osteo Father      spine   ??? Heart Disease Father    ??? Breast Cancer Other    ??? Diabetes Sister      Social History     Social History   ??? Marital status: MARRIED     Spouse name: N/A   ??? Number of children: N/A   ??? Years of education: N/A     Occupational History   ??? Not on file.     Social History Main Topics   ??? Smoking status: Never Smoker   ??? Smokeless tobacco: Never Used   ??? Alcohol use No   ??? Drug use: Not on file   ??? Sexual activity: Not on file     Other Topics Concern   ??? Not on file     Social History Narrative     Current Outpatient Prescriptions   Medication Sig Dispense Refill   ??? escitalopram oxalate (LEXAPRO) 10 mg tablet Take 1 Tab by mouth daily. 30 Tab 5   ??? levETIRAcetam (KEPPRA) 500 mg tablet Take 1 Tab by mouth two (2) times a day. 60 Tab 11   ??? LORazepam (ATIVAN) 1 mg tablet Take 1 Tab by mouth nightly as needed (insomnia). Max Daily Amount: 1 mg. 30 Tab 0   ??? dabrafenib (TAFINLAR) 75 mg cap Take 2 Caps by mouth two (2) times a day. 120 Cap 3   ??? trametinib (MEKINIST) 2 mg tab Take 1 Tab by mouth daily. 30 Tab 3   ??? dexamethasone (DECADRON) 4 mg tablet Take 1 Tab by mouth two (2) times daily (with meals). (Patient taking differently: Take 4 mg by mouth two (2) times daily (with meals). 4 mg one time a day and '2mg'$  once a day) 60 Tab 2   ??? simvastatin (ZOCOR) 20 mg tablet Take 1 Tab by mouth nightly. 90 Tab 3    ??? amLODIPine (NORVASC) 10 mg tablet Take 1 Tab by mouth nightly. 90 Tab 3   ??? losartan-hydroCHLOROthiazide (HYZAAR) 100-25 mg per tablet Take 1 Tab by mouth daily. 90 Tab 3   ??? HYDROcodone-acetaminophen (NORCO) 7.5-325 mg per tablet Take 1 Tab by mouth every four (4) hours as needed. Max Daily Amount: 6 Tabs.  20 Tab 0   ??? multivitamin (ONE A DAY) tablet Take 1 Tab by mouth daily.     ??? TURMERIC ROOT EXTRACT PO Take  by mouth daily.     ??? ferrous sulfate (IRON) 325 mg (65 mg iron) tablet Take  by mouth Daily (before breakfast).     ??? fluticasone (FLONASE) 50 mcg/actuation nasal spray 2 Sprays by Both Nostrils route nightly.     ??? calcium-cholecalciferol, d3, (CALCIUM 600 + D) 600-125 mg-unit tab Take  by mouth three (3) times daily.     ??? cetirizine (ZYRTEC) 10 mg tablet Take  by mouth.         OBJECTIVE:  Visit Vitals   ??? BP (!) 144/98  Comment: standing   ??? Pulse 79   ??? Temp 98.9 ??F (37.2 ??C) (Oral)   ??? Resp 24   ??? Ht _0  (1.702 m)   ??? Wt 142 lb (64.4 kg)   ??? SpO2 96%   ??? BMI 22.24 kg/m2       Physical Exam:  Constitutional: Well developed, well nourished female in no acute distress, sitting comfortably on the examination table.    HEENT: Normocephalic and atraumatic. Oropharynx is clear, mucous membranes are moist.  Sclerae anicteric. Neck supple without JVD. No thyromegaly present.    Lymph node   No palpable submandibular, cervical, supraclavicular, axillary lymph nodes.   Skin Warm and dry.  No bruising and no rash noted.  No erythema.  No pallor.    Respiratory Lungs are clear to auscultation bilaterally without wheezes, rales or rhonchi, normal air exchange without accessory muscle use.    CVS Normal rate, regular rhythm and normal S1 and S2.  No murmurs, gallops, or rubs.   Abdomen Soft, nontender and nondistended, normoactive bowel sounds.  No palpable mass.  No hepatosplenomegaly.   Neuro Grossly nonfocal with no obvious sensory or motor deficits.    MSK Normal range of motion in general.  No edema and no tenderness.   Psych Appropriate mood and affect.      Labs:  Recent Results (from the past 336 hour(s))   METABOLIC PANEL, COMPREHENSIVE    Collection Time: 12/17/16 11:26 AM   Result Value Ref Range    Sodium 136 136 - 145 mmol/L    Potassium 2.8 (LL) 3.5 - 5.1 mmol/L    Chloride 97 (L) 98 - 107 mmol/L    CO2 28 21 - 32 mmol/L    Anion gap 11 7 - 16 mmol/L    Glucose 136 (H) 65 - 100 mg/dL    BUN 21 8 - 23 MG/DL    Creatinine 0.63 0.6 - 1.0 MG/DL    GFR est AA >60 >60 ml/min/1.74m    GFR est non-AA >60 >60 ml/min/1.770m   Calcium 8.7 8.3 - 10.4 MG/DL    Bilirubin, total 0.6 0.2 - 1.1 MG/DL    ALT (SGPT) 59 12 - 65 U/L    AST (SGOT) 32 15 - 37 U/L    Alk. phosphatase 101 50 - 136 U/L    Protein, total 6.0 (L) 6.3 - 8.2 g/dL    Albumin 2.8 (L) 3.2 - 4.6 g/dL    Globulin 3.2 2.3 - 3.5 g/dL    A-G Ratio 0.9 (L) 1.2 - 3.5     MAGNESIUM    Collection Time: 12/17/16 11:26 AM   Result Value Ref Range    Magnesium 2.1 1.8 - 2.4 mg/dL   CBC WITH AUTOMATED DIFF  Collection Time: 12/17/16 11:26 AM   Result Value Ref Range    WBC 5.7 4.3 - 11.1 K/uL    RBC 4.83 4.05 - 5.25 M/uL    HGB 14.5 11.7 - 15.4 g/dL    HCT 41.8 35.8 - 46.3 %    MCV 86.5 79.6 - 97.8 FL    MCH 30.0 26.1 - 32.9 PG    MCHC 34.7 31.4 - 35.0 g/dL    RDW 17.2 (H) 11.9 - 14.6 %    PLATELET 188 150 - 450 K/uL    MPV 8.1 (L) 10.8 - 14.1 FL    ABSOLUTE NRBC 0.12 0.0 - 0.2 K/uL    DF AUTOMATED      NEUTROPHILS 71 43 - 78 %    LYMPHOCYTES 20 13 - 44 %    MONOCYTES 8 4.0 - 12.0 %    EOSINOPHILS 0 (L) 0.5 - 7.8 %    BASOPHILS 0 0.0 - 2.0 %    ABS. NEUTROPHILS 4.0 1.7 - 8.2 K/UL    ABS. LYMPHOCYTES 1.2 0.5 - 4.6 K/UL    ABS. MONOCYTES 0.5 0.1 - 1.3 K/UL    ABS. EOSINOPHILS 0.0 0.0 - 0.8 K/UL    ABS. BASOPHILS 0.0 0.0 - 0.2 K/UL   CBC WITH AUTOMATED DIFF    Collection Time: 12/28/16 10:42 AM   Result Value Ref Range    WBC 7.8 4.3 - 11.1 K/uL    RBC 4.53 4.05 - 5.25 M/uL    HGB 13.6 11.7 - 15.4 g/dL     HCT 39.5 35.8 - 46.3 %    MCV 87.2 79.6 - 97.8 FL    MCH 30.0 26.1 - 32.9 PG    MCHC 34.4 31.4 - 35.0 g/dL    RDW 18.0 (H) 11.9 - 14.6 %    PLATELET 330 150 - 450 K/uL    MPV 8.5 (L) 10.8 - 14.1 FL    ABSOLUTE NRBC 0.08 0.0 - 0.2 K/uL    DF AUTOMATED      NEUTROPHILS 68 43 - 78 %    LYMPHOCYTES 23 13 - 44 %    MONOCYTES 8 4.0 - 12.0 %    EOSINOPHILS 1 0.5 - 7.8 %    BASOPHILS 1 0.0 - 2.0 %    ABS. NEUTROPHILS 5.3 1.7 - 8.2 K/UL    ABS. LYMPHOCYTES 1.8 0.5 - 4.6 K/UL    ABS. MONOCYTES 0.6 0.1 - 1.3 K/UL    ABS. EOSINOPHILS 0.0 0.0 - 0.8 K/UL    ABS. BASOPHILS 0.0 0.0 - 0.2 K/UL   METABOLIC PANEL, COMPREHENSIVE    Collection Time: 12/28/16 10:42 AM   Result Value Ref Range    Sodium 138 136 - 145 mmol/L    Potassium 2.9 (LL) 3.5 - 5.1 mmol/L    Chloride 99 98 - 107 mmol/L    CO2 30 21 - 32 mmol/L    Anion gap 9 7 - 16 mmol/L    Glucose 115 (H) 65 - 100 mg/dL    BUN 15 8 - 23 MG/DL    Creatinine 0.62 0.6 - 1.0 MG/DL    GFR est AA >60 >60 ml/min/1.65m    GFR est non-AA >60 >60 ml/min/1.739m   Calcium 8.4 8.3 - 10.4 MG/DL    Bilirubin, total 0.5 0.2 - 1.1 MG/DL    ALT (SGPT) 33 12 - 65 U/L    AST (SGOT) 26 15 - 37 U/L    Alk. phosphatase 97 50 -  136 U/L    Protein, total 6.3 6.3 - 8.2 g/dL    Albumin 2.5 (L) 3.2 - 4.6 g/dL    Globulin 3.8 (H) 2.3 - 3.5 g/dL    A-G Ratio 0.7 (L) 1.2 - 3.5     MAGNESIUM    Collection Time: 12/28/16 10:42 AM   Result Value Ref Range    Magnesium 1.9 1.8 - 2.4 mg/dL   LD    Collection Time: 12/28/16 10:42 AM   Result Value Ref Range    LD 450 (H) 100 - 190 U/L        Imaging:  PET/CT: 10/29/2016  ??  INDICATION: Liver lesions. History melanoma in 2008.  ??  TECHNIQUE: After oral administration of gastroview and intravenous  administration of 15.44 mCi of F18 FDG, noncontrast CT images were obtained for  attenuation correction and for fusion with emission PET images. A series of  overlapping emission PET images were then obtained beginning 60 minutes after   injection of FDG. The area imaged spanned the region from the skull base to the  mid thighs.  ??  COMPARISON: CT chest, abdomen, and pelvis 10/15/2016  ??  FINDINGS:   NECK/CHEST:  Abnormal hyperdense brain masses are seen located in the left posterior temporal  lobe seen CT image 30 measuring 2.7 cm x 2.4 cm, and in the right frontal lobe  on image 28 measuring 1.2 cm x 0.8 cm. It is uncertain whether these represent  subacute spontaneous hemorrhages, hyperdense intracranial metastases, or  metastases which have subacutely hemorrhage. Mild abnormal PET activity is  favored to correspond to these findings both seen on PET image 302 and therefore  hyperdense intracranial metastases or intracranial metastases which have  subacutely hemorrhage are favored. These should be further assessed with a  contrasted brain MRI. Additional edema seen in the right parietal lobe on image  21 without a definite hyperdense abnormality.  ??  No abnormal activity is seen in the neck. No adenopathy is seen.  ??  Abnormal activity is seen associated with prevascular lymph nodes the largest of  which is seen on image 109 measuring 1.3 cm x 1.1 cm concerning for metastatic  mediastinal adenopathy. In addition, abnormal activity is seen associated with  the previously described bilateral pulmonary nodules with the largest nodule  located in the medial right lower lobe on image 138 measuring 1.9 cm x 1.4 cm  consistent with pulmonary metastases. A subcutaneous lesion is seen in the left  back soft tissues on image 140 measuring 1.4 cm x 0.9 cm which demonstrates mild  activity raising concern for potential subcutaneous lesion such as recurrent  melanoma.  ??  ABDOMEN/PELVIS:  Abnormal activity is seen associated with previous as described hepatic lesions  consistent with hepatic metastases. The largest lesion is felt to reside in the  right lobe seen on image 151 measuring 3.5 cm x 3.2 cm. No worrisome activity is   otherwise seen. There is a 5 mm subcutaneous lesion in the right anterior  abdominal wall seen on CT image 202. No abnormal activity is seen associated  with this lesion although activity is likely underestimate given its small size.  An additional melanoma cannot be excluded. A gallstone is seen within a  noninflamed gallbladder.  ??  Only physiologic activity is seen in the pelvis.  ??  LEGS:  No hypermetabolic lesion or worrisome subcutaneous kidneys lesion is seen in the  lower extremities.  ??  IMPRESSION  IMPRESSION:   1.  Small subcutaneous lesions in the left posterior back, and right anterior  abdominal wall whose appearance is concerning for recurrent melanoma.  ??  2. Abnormal activity associated with prior hepatic and pulmonary lesions  consistent with metastatic lesions. In addition, abnormal activity is seen  associated with mediastinal lymph nodes consistent with additional nodal  metastatic disease. Lastly, there are abnormal hyperdense masses in the brain  which demonstrate mild activity raising concern for intracranial metastases.  Given the hyperdense appearance, the possibility of subacute hemorrhage cannot  be excluded. These should be further assessed with a contrasted MRI of the  Brain.    Results for orders placed during the hospital encounter of 10/15/16   CT CHEST ABD PELV W CONT    Narrative CT SCAN OF THE CHEST WITH CONTRAST: 10/15/2016  Indication: Loss of appetite  Comparison: None   100cc of Isovue 350  was used in the detection of thoracic pathology.  Dose reduction techniques used: Automated exposure control, adjustment of the  mAs and/or kVp according to patient size, standardized low-dose protocol, and/or  iterative reconstruction technique.  Findings: Tracheobronchial tree is intact. There are multiple noncalcified  bilateral lung nodules of which the largest is in the left upper lobe measuring  12 mm. Thyroid is unremarkable. There is no significant mediastinal adenopathy.   Bony structures and the soft tissues are intact    CT scan the abdomen with contrast:    There are multiple low-density lesions throughout the hepatic parenchyma of  which the largest is in the left lobe measuring 20 mm x 25 mm. Gallstones are  present. Adrenal glands, pancreas, spleen and spleen is unremarkable. Right  kidney is unremarkable. There are multiple subcentimeter left renal cyst. Aorta,  small and large bowel, appendix, soft tissues, and the bony structures are  unremarkable. There is no significant adenopathy.    CT scan of pelvis with contrast:    The uterus, adnexal structures, bladder, soft tissues and the bony structures  are unremarkable for mass lesions.      Impression IMPRESSION:: Gallstones, small left renal cysts, hepatic and pulmonary  metastatic lesions     Pathology:        ASSESSMENT:    ICD-10-CM ICD-9-CM    1. 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[place]) C79.31 198.3 2D ECHO COMPLETE ADULT (TTE) W OR WO CONTR   2. 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    Melanoma: metastatic  to liver, lungs, nodes, and also concern for brain lesions on CT imaging from PET.  History of melanoma in 2008 resected from  the back.  Symptoms including fatigue, malaise, anorexia, weight loss, blurry vision, mild intermittent confusion.  Her liver biopsy was definitive for metastatic melanoma, and PET confirmed the multiple lesions in the liver, lungs and lymph nodes.  There were also suspicious changes in the visualized portion of the brain.  We ordered a STAT MRI which confirmed multiple brain lesions, prompting [place] to initiate steroids and refer to radiation oncology.  She completed CyberKnife radiation to the brain lesions, but the day of the first CyberKnife treatment, she was noted to have significant tremor, almost to the point of suggesting some possible partial seizure activity.  She had significant left sided weakness as well.  She was admitted and started on high dose steroids, antihypertensives, and Keppra.  MRI brain confirmed the known hemorrhagic brain lesions, but Neurosurgery did not feel there was anything surgical.  She was able to complete the course of SRS and had some neurologic recovery, with no more seizure-like activity.  Her pathology returned showing a BRAF mutation, we were trying to screen her for the [phone] trial, but with the extent of her disease and the neurologic changes, we felt that we did not have time to wait for immunotherapy effect and recommended upfront dabrafenib/trametinib.  [facility] went to [facility] and they agreed with the plan.  We also spent a great deal of time discussing long-term prognosis, which is limited by her metastatic disease which is incurable, but I explained that with BRAF/MEK inhibitors and with immunotherapy down the line, the potential for treatment response and prolongation of life was certainly realistic.            Here for follow-up.  Now on dabrafenib/trametinib for about 4 weeks.  She is clearly improved, although she continues to have some neurocognitive deficits.  She can remember conversations much better, although she does  not feel her memory is back to baseline.  One of the most notable issues is hypersensitivity to light and sound, this is making it difficult for her to be out of the house.  She also notes mood lability including crying spells.  I would like her to try an SSRI to see if this makes a difference.  Tremors have improved considerably.  No seizure activity.  Continue Keppra.  No issues with dabrafenib/trametinib, some decreased appetite and taste changes, but able to eat whatever she wants.  Labs reviewed and unremarkable for the most part, she remains hypokalemic, add potassium supplementation.  Repeat TTE after 1 month of therapy (now), otherwise continue treatment.  Her clinical symptoms suggest response, we will follow her LDH (which is unchanged from October but is almost certainly better from last month when it was not drawn) and recheck PET/CT in late February.  In all, I spent 45 minutes in the care of Ms. Foote today, over 50% of which was in direct counseling and coordination of care.             Greer Ee, MD  [facility] [place] [place] [address]  Office : [phone]  Fax : [phone]

## 2016-12-28 NOTE — Progress Notes (Signed)
K-2.9. Read back and verified. Angelia Mould made aware

## 2016-12-29 ENCOUNTER — Encounter: Primary: Family Medicine

## 2017-01-04 ENCOUNTER — Inpatient Hospital Stay: Admit: 2017-01-04 | Payer: PRIVATE HEALTH INSURANCE | Attending: Internal Medicine | Primary: Family Medicine

## 2017-01-04 DIAGNOSIS — C7931 Secondary malignant neoplasm of brain: Secondary | ICD-10-CM

## 2017-01-04 LAB — ECHOCARDIOGRAM COMPLETE 2D W DOPPLER W COLOR: Left Ventricular Ejection Fraction: 63

## 2017-01-11 ENCOUNTER — Ambulatory Visit: Payer: BLUE CROSS/BLUE SHIELD | Primary: Family Medicine

## 2017-01-11 ENCOUNTER — Encounter

## 2017-01-12 ENCOUNTER — Ambulatory Visit
Admit: 2017-01-12 | Discharge: 2017-01-12 | Payer: PRIVATE HEALTH INSURANCE | Attending: Internal Medicine | Primary: Family Medicine

## 2017-01-12 DIAGNOSIS — C787 Secondary malignant neoplasm of liver and intrahepatic bile duct: Secondary | ICD-10-CM

## 2017-01-12 NOTE — Patient Instructions (Addendum)
Patient Instructions From Your Nurse    Reason for Visit:  Follow up visit    Plan:  -We agree with Dr. Maceo Pro that it is too early to tell if this is progression    Follow Up:  Follow up with Dr. Jenetta Loges or NP in     Recent Lab Results:      Care plan has been discussed and given to patient: n/a        -------------------------------------------------------------------------------------------------------------------  Please call our office at [phone] if you have any  of the following symptoms:   ?? Fever of 100.5 or greater  ?? Chills  ?? Shortness of breath  ?? Swelling or pain in one leg    After office hours an answering service is available and will contact a provider for emergencies or if you are experiencing any of the above symptoms.    ? Patient did express an interest in My Chart.  My Chart log in information explained on the after visit summary printout at the [place] desk.    Salley Slaughter. Bobby Rumpf, RN

## 2017-01-12 NOTE — Progress Notes (Signed)
[facility]: Office Visit Established Patient    Chief Complaint:    Chief Complaint   Patient presents with   ??? Follow-up     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         History of Present Illness:  Katelyn [name] is a 61 y.o. 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  She has a history of osteopenia, hypertension, hypercholesterolemia, and melanoma of the back s/p excision in 2008, sentinel node was negative but she is unsure what T stage.  She initially presented to her PCP on 10/14/16 reporting a one month history of intermittent upper abdominal pain with loss of appetite and right scapular pain radiating to her right arm.  Ultrasound of the abdomen, obtained on 10/15/16, identified multiple lesions in the liver, concerning for metastatic disease, as well as small hypoechoic areas near the tail of the pancreas, possibly adenopathy.  Further imaging was performed with CT scan of the chest, this showed hepatic and pulmonary lesions highly concerning for metastatic disease.  Lung nodules measured up to 12 mm and liver lesions measured up to 25 mm.  She was referred for urgent oncologic consultation, and liver biopsy was definitive for metastatic melanoma, and PET confirmed the multiple lesions in the liver, lungs and lymph nodes.  There were also suspicious changes in the visualized portion of the brain.  We ordered a STAT MRI which confirmed multiple brain lesions, prompting [place] to initiate steroids and refer to radiation oncology.  She completed CyberKnife radiation to the brain lesions, but the day of the first CyberKnife treatment, she was noted to have significant tremor, almost to the point of suggesting some possible partial seizure activity.  She had significant left sided weakness as well.  She was admitted and started on high dose steroids, antihypertensives, and Keppra.  MRI brain confirmed  the known hemorrhagic brain lesions, but Neurosurgery did not feel there was anything surgical.  She was able to complete the course of SRS and had some neurologic recovery, with no more seizure-like activity.  Her pathology returned showing a BRAF mutation, we were trying to screen her for the [phone] trial, but with the extent of her disease and the neurologic changes, we felt that we did not have time to wait for immunotherapy effect and recommended upfront dabrafenib/trametinib.  She went to [facility] and they agreed with the plan.  We also spent a great deal of time discussing long-term prognosis, which is limited by her metastatic disease which is incurable, but I explained that with BRAF/MEK inhibitors and with immunotherapy down the line, the potential for treatment response and prolongation of life was certainly realistic.            Here for follow-up.  Now on dabrafenib/trametinib for about 6 weeks.  She was clearly improving, but last Wednesday had a setback with relatively sudden onset of aphasia and weakness.  She went to [facility] for evaluation and had a negative CT, she could not be transferred to [facility] due to the hospital being full, so was sent to [facility] and had MRI which showed her known CNS mets with mixed hemorrhage, but no clear progression.  She sees Dr. Maceo Pro on Thursday for further discussion.  She is close to baseline now, the only intervention was increasing her dex, now back on 4 mg TID.  She was fatigued after d/c but improving now.  No issues with dabrafenib/trametinib, appetite and taste changes are improving,  but able to eat whatever she wants.         Review of Systems:  Constitutional: Positive for malaise/fatigue.   HENT: Negative.   Eyes: Negative.   Respiratory: Negative.   Cardiovascular: Negative.   Gastrointestinal: Negative.   Genitourinary: Negative.   Musculoskeletal: Negative.   Skin: Negative.   Neurological: Positive for confusion, weakness.   Endo/Heme/Allergies: Negative.    Psychiatric/Behavioral: Negative.   All other systems reviewed and are negative.     Allergies   Allergen Reactions   ??? Adhesive Tape-Silicones Rash     Past Medical History:   Diagnosis Date   ??? Cancer ([place])     melanoma - back   ??? Hypercholesterolemia    ??? Hypertension     managed with meds   ??? Nausea & vomiting    ??? Osteopenia      Past Surgical History:   Procedure Laterality Date   ??? HX COLONOSCOPY     ??? HX GYN      c-section x 5   ??? HX ORTHOPAEDIC Left 04/30/2016    knee   ??? HX OTHER SURGICAL      melanoma removed from back     Family History   Problem Relation Age of Onset   ??? Hypertension Mother    ??? Elevated Lipids Mother    ??? Elevated Lipids Father    ??? Hypertension Father    ??? Arthritis-osteo Father      spine   ??? Heart Disease Father    ??? Breast Cancer Other    ??? Diabetes Sister      Social History     Social History   ??? Marital status: MARRIED     Spouse name: N/A   ??? Number of children: N/A   ??? Years of education: N/A     Occupational History   ??? Not on file.     Social History Main Topics   ??? Smoking status: Never Smoker   ??? Smokeless tobacco: Never Used   ??? Alcohol use No   ??? Drug use: Not on file   ??? Sexual activity: Not on file     Other Topics Concern   ??? Not on file     Social History Narrative     Current Outpatient Prescriptions   Medication Sig Dispense Refill   ??? metoprolol tartrate (LOPRESSOR) 25 mg tablet Take 12.5 mg by mouth.     ??? losartan (COZAAR) 100 mg tablet Take 100 mg by mouth.     ??? acetaminophen (TYLENOL) 325 mg tablet Take 650 mg by mouth.     ??? potassium chloride (K-DUR, KLOR-CON) 20 mEq tablet Take 20 mEq by mouth.     ??? pantoprazole (PROTONIX) 40 mg tablet Take 40 mg by mouth.     ??? escitalopram oxalate (LEXAPRO) 10 mg tablet Take 1 Tab by mouth daily. 30 Tab 5   ??? HYDROcodone-acetaminophen (NORCO) 7.5-325 mg per tablet Take 1 Tab by mouth every four (4) hours as needed. Max Daily Amount: 6 Tabs. 20 Tab 0    ??? levETIRAcetam (KEPPRA) 500 mg tablet Take 1 Tab by mouth two (2) times a day. 60 Tab 11   ??? LORazepam (ATIVAN) 1 mg tablet Take 1 Tab by mouth nightly as needed (insomnia). Max Daily Amount: 1 mg. 30 Tab 0   ??? dabrafenib (TAFINLAR) 75 mg cap Take 2 Caps by mouth two (2) times a day. 120 Cap 3   ??? trametinib (MEKINIST) 2  mg tab Take 1 Tab by mouth daily. 30 Tab 3   ??? dexamethasone (DECADRON) 4 mg tablet Take 1 Tab by mouth two (2) times daily (with meals). (Patient taking differently: Take 4 mg by mouth two (2) times daily (with meals). 4 mg one time a day and 70m once a day) 60 Tab 2   ??? simvastatin (ZOCOR) 20 mg tablet Take 1 Tab by mouth nightly. 90 Tab 3   ??? amLODIPine (NORVASC) 10 mg tablet Take 1 Tab by mouth nightly. 90 Tab 3   ??? multivitamin (ONE A DAY) tablet Take 1 Tab by mouth daily.     ??? TURMERIC ROOT EXTRACT PO Take  by mouth daily.     ??? ferrous sulfate (IRON) 325 mg (65 mg iron) tablet Take  by mouth Daily (before breakfast).     ??? fluticasone (FLONASE) 50 mcg/actuation nasal spray 2 Sprays by Both Nostrils route nightly.     ??? calcium-cholecalciferol, d3, (CALCIUM 600 + D) 600-125 mg-unit tab Take  by mouth three (3) times daily.     ??? cetirizine (ZYRTEC) 10 mg tablet Take  by mouth.         OBJECTIVE:  Visit Vitals   ??? BP 126/76  Comment: standing   ??? Pulse 87   ??? Temp 98.3 ??F (36.8 ??C) (Oral)   ??? Resp 22   ??? Ht '5\' 7"'  (1.702 m)   ??? Wt 142 lb (64.4 kg)   ??? SpO2 96%   ??? BMI 22.24 kg/m2       Physical Exam:  Constitutional: Well developed, well nourished female in no acute distress, sitting comfortably on the examination table.    HEENT: Normocephalic and atraumatic. Sclerae anicteric.    Skin Warm and dry.  No bruising and no rash noted.  No erythema.  No pallor.    Cardiopulmonary Deferred.   Neuro Grossly nonfocal with no obvious sensory or motor deficits.   MSK Normal range of motion in general.  No edema and no tenderness.   Psych Appropriate mood and affect.         Labs:   No results found for this or any previous visit (from the past 336 hour(s)).     Imaging:  PET/CT: 10/29/2016  ??  INDICATION: Liver lesions. History melanoma in 2008.  ??  TECHNIQUE: After oral administration of gastroview and intravenous  administration of 15.44 mCi of F18 FDG, noncontrast CT images were obtained for  attenuation correction and for fusion with emission PET images. A series of  overlapping emission PET images were then obtained beginning 60 minutes after  injection of FDG. The area imaged spanned the region from the skull base to the  mid thighs.  ??  COMPARISON: CT chest, abdomen, and pelvis 10/15/2016  ??  FINDINGS:   NECK/CHEST:  Abnormal hyperdense brain masses are seen located in the left posterior temporal  lobe seen CT image 30 measuring 2.7 cm x 2.4 cm, and in the right frontal lobe  on image 28 measuring 1.2 cm x 0.8 cm. It is uncertain whether these represent  subacute spontaneous hemorrhages, hyperdense intracranial metastases, or  metastases which have subacutely hemorrhage. Mild abnormal PET activity is  favored to correspond to these findings both seen on PET image 302 and therefore  hyperdense intracranial metastases or intracranial metastases which have  subacutely hemorrhage are favored. These should be further assessed with a  contrasted brain MRI. Additional edema seen in the right parietal lobe on image  21 without a definite hyperdense abnormality.  ??  No abnormal activity is seen in the neck. No adenopathy is seen.  ??  Abnormal activity is seen associated with prevascular lymph nodes the largest of  which is seen on image 109 measuring 1.3 cm x 1.1 cm concerning for metastatic  mediastinal adenopathy. In addition, abnormal activity is seen associated with  the previously described bilateral pulmonary nodules with the largest nodule  located in the medial right lower lobe on image 138 measuring 1.9 cm x 1.4 cm   consistent with pulmonary metastases. A subcutaneous lesion is seen in the left  back soft tissues on image 140 measuring 1.4 cm x 0.9 cm which demonstrates mild  activity raising concern for potential subcutaneous lesion such as recurrent  melanoma.  ??  ABDOMEN/PELVIS:  Abnormal activity is seen associated with previous as described hepatic lesions  consistent with hepatic metastases. The largest lesion is felt to reside in the  right lobe seen on image 151 measuring 3.5 cm x 3.2 cm. No worrisome activity is  otherwise seen. There is a 5 mm subcutaneous lesion in the right anterior  abdominal wall seen on CT image 202. No abnormal activity is seen associated  with this lesion although activity is likely underestimate given its small size.  An additional melanoma cannot be excluded. A gallstone is seen within a  noninflamed gallbladder.  ??  Only physiologic activity is seen in the pelvis.  ??  LEGS:  No hypermetabolic lesion or worrisome subcutaneous kidneys lesion is seen in the  lower extremities.  ??  IMPRESSION  IMPRESSION:   1. Small subcutaneous lesions in the left posterior back, and right anterior  abdominal wall whose appearance is concerning for recurrent melanoma.  ??  2. Abnormal activity associated with prior hepatic and pulmonary lesions  consistent with metastatic lesions. In addition, abnormal activity is seen  associated with mediastinal lymph nodes consistent with additional nodal  metastatic disease. Lastly, there are abnormal hyperdense masses in the brain  which demonstrate mild activity raising concern for intracranial metastases.  Given the hyperdense appearance, the possibility of subacute hemorrhage cannot  be excluded. These should be further assessed with a contrasted MRI of the  Brain.    Results for orders placed during the hospital encounter of 10/15/16   CT CHEST ABD PELV W CONT    Narrative CT SCAN OF THE CHEST WITH CONTRAST: 10/15/2016  Indication: Loss of appetite  Comparison: None    100cc of Isovue 350  was used in the detection of thoracic pathology.  Dose reduction techniques used: Automated exposure control, adjustment of the  mAs and/or kVp according to patient size, standardized low-dose protocol, and/or  iterative reconstruction technique.  Findings: Tracheobronchial tree is intact. There are multiple noncalcified  bilateral lung nodules of which the largest is in the left upper lobe measuring  12 mm. Thyroid is unremarkable. There is no significant mediastinal adenopathy.  Bony structures and the soft tissues are intact    CT scan the abdomen with contrast:    There are multiple low-density lesions throughout the hepatic parenchyma of  which the largest is in the left lobe measuring 20 mm x 25 mm. Gallstones are  present. Adrenal glands, pancreas, spleen and spleen is unremarkable. Right  kidney is unremarkable. There are multiple subcentimeter left renal cyst. Aorta,  small and large bowel, appendix, soft tissues, and the bony structures are  unremarkable. There is no significant adenopathy.    CT scan of  pelvis with contrast:    The uterus, adnexal structures, bladder, soft tissues and the bony structures  are unremarkable for mass lesions.      Impression IMPRESSION:: Gallstones, small left renal cysts, hepatic and pulmonary  metastatic lesions     Pathology:        ASSESSMENT:    ICD-10-CM ICD-9-CM    1. 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 (HCC) C78.7 197.7      172.9    2. 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    Melanoma: metastatic to liver, lungs, nodes, and also concern for brain lesions on CT imaging from PET.  History of melanoma in 2008 resected from the back.  Symptoms including fatigue, malaise, anorexia, weight loss, blurry vision, mild intermittent confusion.  Her liver biopsy was definitive for metastatic melanoma, and PET confirmed the multiple lesions in the liver, lungs and lymph nodes.  There were also suspicious changes in the visualized portion of the brain.  We ordered a STAT MRI which confirmed multiple brain lesions, prompting [place] to initiate steroids and refer to radiation oncology.  She completed CyberKnife radiation to the brain lesions, but the day of the first CyberKnife treatment, she was noted to have significant tremor, almost to the point of suggesting some possible partial seizure activity.  She had significant left sided weakness as well.  She was admitted and started on high dose steroids, antihypertensives, and Keppra.  MRI brain confirmed the known hemorrhagic brain lesions, but Neurosurgery did not feel there was anything surgical.  She was able to complete the course of SRS and had some neurologic recovery, with no more seizure-like activity.  Her pathology returned showing a BRAF mutation, we were trying to screen her for the [id number] trial, but with the extent of her disease and the neurologic changes, we felt that we did not have time to wait for immunotherapy effect and recommended upfront dabrafenib/trametinib.  [facility] went to [facility] and  they agreed with the plan.  We also spent a great deal of time discussing long-term prognosis, which is limited by her metastatic disease which is incurable, but I explained that with BRAF/MEK inhibitors and with immunotherapy down the line, the potential for treatment response and prolongation of life was certainly realistic.            Here for follow-up.  Now on dabrafenib/trametinib for about  6 weeks.  She was clearly improving, but last Wednesday had a setback with relatively sudden onset of aphasia and weakness.  She went to [facility] for evaluation and had a negative CT, she could not be transferred to [facility] due to the hospital being full, so was sent to [facility] and had MRI which showed her known CNS mets with mixed hemorrhage, but no clear progression.  She sees Dr. Maceo Pro on Thursday for further discussion.  She is close to baseline now, the only intervention was increasing her dex, now back on 4 mg TID.  Taper per Dr. Maceo Pro.  She was fatigued after d/c but improving now.  No issues with dabrafenib/trametinib, appetite and taste changes are improving, but able to eat whatever she wants.  Her clinical symptoms suggest systemic disease response, we will follow her LDH and recheck PET/CT in late February.  Keep follow-up in 2 weeks and continue Taf/Mek.  In all, I spent 45 minutes in the care of Ms. Fosdick today, over 50% of which was in direct counseling and coordination of care.             Greer Ee, MD  [facility] [place] [address]  Office : [phone]  Fax : [phone]

## 2017-01-21 NOTE — Progress Notes (Signed)
Per [name] @ [facility] @ [phone] approval # for Katelyn [name] is [id number] and Mekinist is [id number] and both are good until 01/21/18.

## 2017-01-25 MED ORDER — AMOXICILLIN CLAVULANATE 875 MG-125 MG TAB
875-125 mg | ORAL_TABLET | Freq: Two times a day (BID) | ORAL | 0 refills | Status: DC
Start: 2017-01-25 — End: 2017-02-24

## 2017-01-25 NOTE — Telephone Encounter (Signed)
Patient with cough that hurts, productive for clear mucus.  She has sinus pain. This has been ongoing for the past few days.  Patient denies fever.    Reviewed with NP, will start Augmentin 875 bid X 10 days.

## 2017-01-29 ENCOUNTER — Ambulatory Visit
Admit: 2017-01-29 | Discharge: 2017-01-29 | Payer: PRIVATE HEALTH INSURANCE | Attending: Internal Medicine | Primary: Family Medicine

## 2017-01-29 ENCOUNTER — Inpatient Hospital Stay: Admit: 2017-01-29 | Payer: PRIVATE HEALTH INSURANCE | Primary: Family Medicine

## 2017-01-29 DIAGNOSIS — C7931 Secondary malignant neoplasm of brain: Secondary | ICD-10-CM

## 2017-01-29 LAB — CBC WITH AUTOMATED DIFF
ABS. BASOPHILS: 0 10*3/uL (ref 0.0–0.2)
ABS. EOSINOPHILS: 0 10*3/uL (ref 0.0–0.8)
ABS. LYMPHOCYTES: 1.6 10*3/uL (ref 0.5–4.6)
ABS. MONOCYTES: 0.3 10*3/uL (ref 0.1–1.3)
ABS. NEUTROPHILS: 3.4 10*3/uL (ref 1.7–8.2)
ABSOLUTE NRBC: 0.01 10*3/uL (ref 0.0–0.2)
BASOPHILS: 0 % (ref 0.0–2.0)
EOSINOPHILS: 0 % — ABNORMAL LOW (ref 0.5–7.8)
HCT: 41.1 % (ref 35.8–46.3)
HGB: 14.3 g/dL (ref 11.7–15.4)
LYMPHOCYTES: 30 % (ref 13–44)
MCH: 31.2 PG (ref 26.1–32.9)
MCHC: 34.8 g/dL (ref 31.4–35.0)
MCV: 89.5 FL (ref 79.6–97.8)
MONOCYTES: 5 % (ref 4.0–12.0)
MPV: 8.2 FL — ABNORMAL LOW (ref 10.8–14.1)
NEUTROPHILS: 65 % (ref 43–78)
PLATELET COMMENTS: ADEQUATE
PLATELET: 255 10*3/uL (ref 150–450)
RBC: 4.59 M/uL (ref 4.05–5.25)
RDW: 17.5 % — ABNORMAL HIGH (ref 11.9–14.6)
WBC: 5.3 10*3/uL (ref 4.3–11.1)

## 2017-01-29 LAB — METABOLIC PANEL, COMPREHENSIVE
A-G Ratio: 0.8 — ABNORMAL LOW (ref 1.2–3.5)
ALT (SGPT): 31 U/L (ref 12–65)
AST (SGOT): 26 U/L (ref 15–37)
Albumin: 2.7 g/dL — ABNORMAL LOW (ref 3.2–4.6)
Alk. phosphatase: 91 U/L (ref 50–136)
Anion gap: 7 mmol/L (ref 7–16)
BUN: 17 MG/DL (ref 8–23)
Bilirubin, total: 0.3 MG/DL (ref 0.2–1.1)
CO2: 28 mmol/L (ref 21–32)
Calcium: 8.4 MG/DL (ref 8.3–10.4)
Chloride: 102 mmol/L (ref 98–107)
Creatinine: 0.87 MG/DL (ref 0.6–1.0)
GFR est AA: >60 mL/min/{1.73_m2} (ref >60)
GFR est non-AA: >60 mL/min/{1.73_m2} (ref >60)
Globulin: 3.5 g/dL (ref 2.3–3.5)
Glucose: 219 mg/dL — ABNORMAL HIGH (ref 65–100)
Potassium: 3.4 mmol/L — ABNORMAL LOW (ref 3.5–5.1)
Protein, total: 6.2 g/dL — ABNORMAL LOW (ref 6.3–8.2)
Sodium: 137 mmol/L (ref 136–145)

## 2017-01-29 LAB — MAGNESIUM: Magnesium: 2 mg/dL (ref 1.8–2.4)

## 2017-01-29 LAB — LD: LD: 328 U/L — ABNORMAL HIGH (ref 100–190)

## 2017-01-29 NOTE — Patient Instructions (Signed)
Patient Instructions From Your Nurse    Reason for Visit:  Follow up visit    Diagnosis Information:  https://www.cancer.net/about-us/asco-answers-patient-education-materials/asco-answers-fact-sheets      Plan:  -We are planning to do another PET scan in 4 weeks and will see you after    Follow Up:  Follow up with Dr. Jenetta Loges after the PET scan    Recent Lab Results:  Recent Results (from the past 12 hour(s))   CBC WITH AUTOMATED DIFF    Collection Time: 01/29/17  2:19 PM   Result Value Ref Range    WBC 5.3 4.3 - 11.1 K/uL    RBC 4.59 4.05 - 5.25 M/uL    HGB 14.3 11.7 - 15.4 g/dL    HCT 41.1 35.8 - 46.3 %    MCV 89.5 79.6 - 97.8 FL    MCH 31.2 26.1 - 32.9 PG    MCHC 34.8 31.4 - 35.0 g/dL    RDW 17.5 (H) 11.9 - 14.6 %    PLATELET 255 150 - 450 K/uL    MPV 8.2 (L) 10.8 - 14.1 FL    ABSOLUTE NRBC 0.01 0.0 - 0.2 K/uL    NEUTROPHILS 65 43 - 78 %    LYMPHOCYTES 30 13 - 44 %    MONOCYTES 5 4.0 - 12.0 %    EOSINOPHILS 0 (L) 0.5 - 7.8 %    BASOPHILS 0 0.0 - 2.0 %    ABS. NEUTROPHILS 3.4 1.7 - 8.2 K/UL    ABS. LYMPHOCYTES 1.6 0.5 - 4.6 K/UL    ABS. MONOCYTES 0.3 0.1 - 1.3 K/UL    ABS. EOSINOPHILS 0.0 0.0 - 0.8 K/UL    ABS. BASOPHILS 0.0 0.0 - 0.2 K/UL    RBC COMMENTS NORMOCYTIC/NORMOCHROMIC      WBC COMMENTS Result Confirmed By Smear      PLATELET COMMENTS ADEQUATE      DF AUTOMATED     METABOLIC PANEL, COMPREHENSIVE    Collection Time: 01/29/17  2:19 PM   Result Value Ref Range    Sodium 137 136 - 145 mmol/L    Potassium 3.4 (L) 3.5 - 5.1 mmol/L    Chloride 102 98 - 107 mmol/L    CO2 28 21 - 32 mmol/L    Anion gap 7 7 - 16 mmol/L    Glucose 219 (H) 65 - 100 mg/dL    BUN 17 8 - 23 MG/DL    Creatinine 0.87 0.6 - 1.0 MG/DL    GFR est AA >60 >60 ml/min/1.49m    GFR est non-AA >60 >60 ml/min/1.742m   Calcium 8.4 8.3 - 10.4 MG/DL    Bilirubin, total 0.3 0.2 - 1.1 MG/DL    ALT (SGPT) 31 12 - 65 U/L    AST (SGOT) 26 15 - 37 U/L    Alk. phosphatase 91 50 - 136 U/L    Protein, total 6.2 (L) 6.3 - 8.2 g/dL     Albumin 2.7 (L) 3.2 - 4.6 g/dL    Globulin 3.5 2.3 - 3.5 g/dL    A-G Ratio 0.8 (L) 1.2 - 3.5     MAGNESIUM    Collection Time: 01/29/17  2:19 PM   Result Value Ref Range    Magnesium 2.0 1.8 - 2.4 mg/dL   LD    Collection Time: 01/29/17  2:19 PM   Result Value Ref Range    LD 328 (H) 100 - 190 U/L         Care plan has been discussed  and given to patient: n/a        -------------------------------------------------------------------------------------------------------------------  Please call our office at [phone] if you have any  of the following symptoms:   ?? Fever of 100.5 or greater  ?? Chills  ?? Shortness of breath  ?? Swelling or pain in one leg    After office hours an answering service is available and will contact a provider for emergencies or if you are experiencing any of the above symptoms.    ? Patient did express an interest in My Chart.  My Chart log in information explained on the after visit summary printout at the [facility] desk.    Salley Slaughter. Bobby Rumpf, RN

## 2017-01-29 NOTE — Progress Notes (Signed)
[facility]: Office Visit Established Patient    Chief Complaint:    Chief Complaint   Patient presents with   ??? Follow-up     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         History of Present Illness:  Ms. Katelyn [name] is a 61 y.o. 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  She has a history of osteopenia, hypertension, hypercholesterolemia, and melanoma of the back s/p excision in 2008, sentinel node was negative but she is unsure what T stage.  She initially presented to her PCP on 10/14/16 reporting a one month history of intermittent upper abdominal pain with loss of appetite and right scapular pain radiating to her right arm.  Ultrasound of the abdomen, obtained on 10/15/16, identified multiple lesions in the liver, concerning for metastatic disease, as well as small hypoechoic areas near the tail of the pancreas, possibly adenopathy.  Further imaging was performed with CT scan of the chest, this showed hepatic and pulmonary lesions highly concerning for metastatic disease.  Lung nodules measured up to 12 mm and liver lesions measured up to 25 mm.  She was referred for urgent oncologic consultation, and liver biopsy was definitive for metastatic melanoma, and PET confirmed the multiple lesions in the liver, lungs and lymph nodes.  There were also suspicious changes in the visualized portion of the brain.  We ordered a STAT MRI which confirmed multiple brain lesions, prompting [place] to initiate steroids and refer to radiation oncology.  She completed CyberKnife radiation to the brain lesions, but the day of the first CyberKnife treatment, she was noted to have significant tremor, almost to the point of suggesting some possible partial seizure activity.  She had significant left sided weakness as well.  She was admitted and started on high dose steroids, antihypertensives, and Keppra.  MRI brain confirmed  the known hemorrhagic brain lesions, but Neurosurgery did not feel there was anything surgical.  She was able to complete the course of SRS and had some neurologic recovery, with no more seizure-like activity.  Her pathology returned showing a BRAF mutation, we were trying to screen her for the [phone] trial, but with the extent of her disease and the neurologic changes, we felt that we did not have time to wait for immunotherapy effect and recommended upfront dabrafenib/trametinib.  She went to [facility] and they agreed with the plan.  We also spent a great deal of time discussing long-term prognosis, which is limited by her metastatic disease which is incurable, but I explained that with BRAF/MEK inhibitors and with immunotherapy down the line, the potential for treatment response and prolongation of life was certainly realistic.            Here for follow-up.  Now on dabrafenib/trametinib for about 2.5 months.  She is doing well overall.  No recurrence of the acute episode of aphasia and weakness.  Currently on dex 4 mg BID with plans to drop to 4/2 today.  Overall feeling well, some abdominal bloating but no pain, no nausea, appetite is great.  No other sites of pain.  No headache.  Neurocognitive symptoms unchanged.  Tolerating Taf/Mek well.  She had a sinus infection over the weekend, improved with Augmentin.      Review of Systems:  Constitutional: Positive for fatigue.   HENT: Negative.   Eyes: Negative.   Respiratory: Negative.   Cardiovascular: Negative.   Gastrointestinal: Positive for bloating.   Genitourinary: Negative.  Musculoskeletal: Negative.   Skin: Negative.   Neurological: Positive for confusion.   Endo/Heme/Allergies: Negative.   Psychiatric/Behavioral: Negative.   All other systems reviewed and are negative.     Allergies   Allergen Reactions   ??? Adhesive Tape-Silicones Rash     Past Medical History:   Diagnosis Date   ??? Cancer ([place])     melanoma - back   ??? Hypercholesterolemia     ??? Hypertension     managed with meds   ??? Nausea & vomiting    ??? Osteopenia      Past Surgical History:   Procedure Laterality Date   ??? HX COLONOSCOPY     ??? HX GYN      c-section x 5   ??? HX ORTHOPAEDIC Left 04/30/2016    knee   ??? HX OTHER SURGICAL      melanoma removed from back     Family History   Problem Relation Age of Onset   ??? Hypertension Mother    ??? Elevated Lipids Mother    ??? Elevated Lipids Father    ??? Hypertension Father    ??? Arthritis-osteo Father      spine   ??? Heart Disease Father    ??? Breast Cancer Other    ??? Diabetes Sister      Social History     Social History   ??? Marital status: MARRIED     Spouse name: N/A   ??? Number of children: N/A   ??? Years of education: N/A     Occupational History   ??? Not on file.     Social History Main Topics   ??? Smoking status: Never Smoker   ??? Smokeless tobacco: Never Used   ??? Alcohol use No   ??? Drug use: Not on file   ??? Sexual activity: Not on file     Other Topics Concern   ??? Not on file     Social History Narrative     Current Outpatient Prescriptions   Medication Sig Dispense Refill   ??? amoxicillin-clavulanate (AUGMENTIN) 875-125 mg per tablet Take 1 Tab by mouth two (2) times a day. 20 Tab 0   ??? metoprolol tartrate (LOPRESSOR) 25 mg tablet Take 12.5 mg by mouth.     ??? losartan (COZAAR) 100 mg tablet Take 100 mg by mouth.     ??? acetaminophen (TYLENOL) 325 mg tablet Take 650 mg by mouth.     ??? escitalopram oxalate (LEXAPRO) 10 mg tablet Take 1 Tab by mouth daily. 30 Tab 5   ??? levETIRAcetam (KEPPRA) 500 mg tablet Take 1 Tab by mouth two (2) times a day. 60 Tab 11   ??? dabrafenib (TAFINLAR) 75 mg cap Take 2 Caps by mouth two (2) times a day. 120 Cap 3   ??? trametinib (MEKINIST) 2 mg tab Take 1 Tab by mouth daily. 30 Tab 3   ??? dexamethasone (DECADRON) 4 mg tablet Take 1 Tab by mouth two (2) times daily (with meals). (Patient taking differently: Take 4 mg by mouth two (2) times daily (with meals). 4 mg one time a day and 4m once a day) 60 Tab 2    ??? simvastatin (ZOCOR) 20 mg tablet Take 1 Tab by mouth nightly. 90 Tab 3   ??? amLODIPine (NORVASC) 10 mg tablet Take 1 Tab by mouth nightly. 90 Tab 3   ??? fluticasone (FLONASE) 50 mcg/actuation nasal spray 2 Sprays by Both Nostrils route nightly.     ???  cetirizine (ZYRTEC) 10 mg tablet Take  by mouth.     ??? pantoprazole (PROTONIX) 40 mg tablet Take 40 mg by mouth.     ??? HYDROcodone-acetaminophen (NORCO) 7.5-325 mg per tablet Take 1 Tab by mouth every four (4) hours as needed. Max Daily Amount: 6 Tabs. 20 Tab 0   ??? LORazepam (ATIVAN) 1 mg tablet Take 1 Tab by mouth nightly as needed (insomnia). Max Daily Amount: 1 mg. 30 Tab 0   ??? multivitamin (ONE A DAY) tablet Take 1 Tab by mouth daily.     ??? TURMERIC ROOT EXTRACT PO Take  by mouth daily.     ??? ferrous sulfate (IRON) 325 mg (65 mg iron) tablet Take  by mouth Daily (before breakfast).     ??? calcium-cholecalciferol, d3, (CALCIUM 600 + D) 600-125 mg-unit tab Take  by mouth three (3) times daily.         OBJECTIVE:  Visit Vitals   ??? BP 134/79  Comment: standing   ??? Pulse 80   ??? Temp 98.3 ??F (36.8 ??C) (Oral)   ??? Ht 5' 7" (1.702 m)   ??? Wt 147 lb (66.7 kg)   ??? SpO2 93%   ??? BMI 23.02 kg/m2       Physical Exam:  Constitutional: Well developed, well nourished female in no acute distress, sitting comfortably in the hospital bed.    HEENT: Normocephalic and atraumatic. Oropharynx is clear, mucous membranes are moist.  Pupils are equal, round, and reactive to light. Extraocular muscles are intact.  Sclerae anicteric. Neck supple without JVD. No thyromegaly present.    Lymph node   No palpable submandibular, cervical, supraclavicular lymph nodes.   Skin Warm and dry.  No bruising and no rash noted.  No erythema.  No pallor.    Respiratory Lungs are clear to auscultation bilaterally without wheezes, rales or rhonchi, normal air exchange without accessory muscle use.    CVS Normal rate, regular rhythm and normal S1 and S2.  No murmurs, gallops, or rubs.    Abdomen Soft, nontender and nondistended, normoactive bowel sounds.  No palpable mass.  No hepatosplenomegaly.   Neuro Grossly nonfocal with no obvious sensory or motor deficits.   MSK Normal range of motion in general.  No edema and no tenderness.   Psych Appropriate mood and affect.          Labs:  Recent Results (from the past 336 hour(s))   CBC WITH AUTOMATED DIFF    Collection Time: 01/29/17  2:19 PM   Result Value Ref Range    WBC 5.3 4.3 - 11.1 K/uL    RBC 4.59 4.05 - 5.25 M/uL    HGB 14.3 11.7 - 15.4 g/dL    HCT 41.1 35.8 - 46.3 %    MCV 89.5 79.6 - 97.8 FL    MCH 31.2 26.1 - 32.9 PG    MCHC 34.8 31.4 - 35.0 g/dL    RDW 17.5 (H) 11.9 - 14.6 %    PLATELET 255 150 - 450 K/uL    MPV 8.2 (L) 10.8 - 14.1 FL    ABSOLUTE NRBC 0.01 0.0 - 0.2 K/uL    NEUTROPHILS 65 43 - 78 %    LYMPHOCYTES 30 13 - 44 %    MONOCYTES 5 4.0 - 12.0 %    EOSINOPHILS 0 (L) 0.5 - 7.8 %    BASOPHILS 0 0.0 - 2.0 %    ABS. NEUTROPHILS 3.4 1.7 - 8.2 K/UL    ABS.  LYMPHOCYTES 1.6 0.5 - 4.6 K/UL    ABS. MONOCYTES 0.3 0.1 - 1.3 K/UL    ABS. EOSINOPHILS 0.0 0.0 - 0.8 K/UL    ABS. BASOPHILS 0.0 0.0 - 0.2 K/UL    RBC COMMENTS NORMOCYTIC/NORMOCHROMIC      WBC COMMENTS Result Confirmed By Smear      PLATELET COMMENTS ADEQUATE      DF AUTOMATED     METABOLIC PANEL, COMPREHENSIVE    Collection Time: 01/29/17  2:19 PM   Result Value Ref Range    Sodium 137 136 - 145 mmol/L    Potassium 3.4 (L) 3.5 - 5.1 mmol/L    Chloride 102 98 - 107 mmol/L    CO2 28 21 - 32 mmol/L    Anion gap 7 7 - 16 mmol/L    Glucose 219 (H) 65 - 100 mg/dL    BUN 17 8 - 23 MG/DL    Creatinine 0.87 0.6 - 1.0 MG/DL    GFR est AA >60 >60 ml/min/1.64m    GFR est non-AA >60 >60 ml/min/1.718m   Calcium 8.4 8.3 - 10.4 MG/DL    Bilirubin, total 0.3 0.2 - 1.1 MG/DL    ALT (SGPT) 31 12 - 65 U/L    AST (SGOT) 26 15 - 37 U/L    Alk. phosphatase 91 50 - 136 U/L    Protein, total 6.2 (L) 6.3 - 8.2 g/dL    Albumin 2.7 (L) 3.2 - 4.6 g/dL    Globulin 3.5 2.3 - 3.5 g/dL     A-G Ratio 0.8 (L) 1.2 - 3.5     MAGNESIUM    Collection Time: 01/29/17  2:19 PM   Result Value Ref Range    Magnesium 2.0 1.8 - 2.4 mg/dL   LD    Collection Time: 01/29/17  2:19 PM   Result Value Ref Range    LD 328 (H) 100 - 190 U/L        Imaging:  PET/CT: 10/29/2016  ??  INDICATION: Liver lesions. History melanoma in 2008.  ??  TECHNIQUE: After oral administration of gastroview and intravenous  administration of 15.44 mCi of F18 FDG, noncontrast CT images were obtained for  attenuation correction and for fusion with emission PET images. A series of  overlapping emission PET images were then obtained beginning 60 minutes after  injection of FDG. The area imaged spanned the region from the skull base to the  mid thighs.  ??  COMPARISON: CT chest, abdomen, and pelvis 10/15/2016  ??  FINDINGS:   NECK/CHEST:  Abnormal hyperdense brain masses are seen located in the left posterior temporal  lobe seen CT image 30 measuring 2.7 cm x 2.4 cm, and in the right frontal lobe  on image 28 measuring 1.2 cm x 0.8 cm. It is uncertain whether these represent  subacute spontaneous hemorrhages, hyperdense intracranial metastases, or  metastases which have subacutely hemorrhage. Mild abnormal PET activity is  favored to correspond to these findings both seen on PET image 302 and therefore  hyperdense intracranial metastases or intracranial metastases which have  subacutely hemorrhage are favored. These should be further assessed with a  contrasted brain MRI. Additional edema seen in the right parietal lobe on image  21 without a definite hyperdense abnormality.  ??  No abnormal activity is seen in the neck. No adenopathy is seen.  ??  Abnormal activity is seen associated with prevascular lymph nodes the largest of  which is seen on image 109 measuring 1.3 cm  x 1.1 cm concerning for metastatic  mediastinal adenopathy. In addition, abnormal activity is seen associated with   the previously described bilateral pulmonary nodules with the largest nodule  located in the medial right lower lobe on image 138 measuring 1.9 cm x 1.4 cm  consistent with pulmonary metastases. A subcutaneous lesion is seen in the left  back soft tissues on image 140 measuring 1.4 cm x 0.9 cm which demonstrates mild  activity raising concern for potential subcutaneous lesion such as recurrent  melanoma.  ??  ABDOMEN/PELVIS:  Abnormal activity is seen associated with previous as described hepatic lesions  consistent with hepatic metastases. The largest lesion is felt to reside in the  right lobe seen on image 151 measuring 3.5 cm x 3.2 cm. No worrisome activity is  otherwise seen. There is a 5 mm subcutaneous lesion in the right anterior  abdominal wall seen on CT image 202. No abnormal activity is seen associated  with this lesion although activity is likely underestimate given its small size.  An additional melanoma cannot be excluded. A gallstone is seen within a  noninflamed gallbladder.  ??  Only physiologic activity is seen in the pelvis.  ??  LEGS:  No hypermetabolic lesion or worrisome subcutaneous kidneys lesion is seen in the  lower extremities.  ??  IMPRESSION  IMPRESSION:   1. Small subcutaneous lesions in the left posterior back, and right anterior  abdominal wall whose appearance is concerning for recurrent melanoma.  ??  2. Abnormal activity associated with prior hepatic and pulmonary lesions  consistent with metastatic lesions. In addition, abnormal activity is seen  associated with mediastinal lymph nodes consistent with additional nodal  metastatic disease. Lastly, there are abnormal hyperdense masses in the brain  which demonstrate mild activity raising concern for intracranial metastases.  Given the hyperdense appearance, the possibility of subacute hemorrhage cannot  be excluded. These should be further assessed with a contrasted MRI of the  Brain.     Results for orders placed during the hospital encounter of 10/15/16   CT CHEST ABD PELV W CONT    Narrative CT SCAN OF THE CHEST WITH CONTRAST: 10/15/2016  Indication: Loss of appetite  Comparison: None   100cc of Isovue 350  was used in the detection of thoracic pathology.  Dose reduction techniques used: Automated exposure control, adjustment of the  mAs and/or kVp according to patient size, standardized low-dose protocol, and/or  iterative reconstruction technique.  Findings: Tracheobronchial tree is intact. There are multiple noncalcified  bilateral lung nodules of which the largest is in the left upper lobe measuring  12 mm. Thyroid is unremarkable. There is no significant mediastinal adenopathy.  Bony structures and the soft tissues are intact    CT scan the abdomen with contrast:    There are multiple low-density lesions throughout the hepatic parenchyma of  which the largest is in the left lobe measuring 20 mm x 25 mm. Gallstones are  present. Adrenal glands, pancreas, spleen and spleen is unremarkable. Right  kidney is unremarkable. There are multiple subcentimeter left renal cyst. Aorta,  small and large bowel, appendix, soft tissues, and the bony structures are  unremarkable. There is no significant adenopathy.    CT scan of pelvis with contrast:    The uterus, adnexal structures, bladder, soft tissues and the bony structures  are unremarkable for mass lesions.      Impression IMPRESSION:: Gallstones, small left renal cysts, hepatic and pulmonary  metastatic lesions     Pathology:  ASSESSMENT:    ICD-10-CM ICD-9-CM    1. 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[place]) C79.31 198.3 PET/CT TUMOR IMAGE WH BDY W (SUB)   2. 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    Melanoma: metastatic to liver, lungs, nodes, and also concern for brain lesions on CT imaging from PET.  History of melanoma in 2008 resected from the back.  Symptoms including fatigue, malaise, anorexia, weight loss, blurry vision, mild intermittent confusion.  Her liver biopsy was definitive for metastatic melanoma, and PET confirmed the multiple lesions in the liver, lungs and lymph nodes.  There were also suspicious changes in the visualized portion of the brain.  We ordered a STAT MRI which confirmed multiple brain lesions, prompting [place] to initiate steroids and refer to radiation oncology.  She completed CyberKnife radiation to the brain lesions, but the day of the first CyberKnife treatment, she was noted to have significant tremor, almost to the point of suggesting some possible partial seizure activity.  She had significant left sided weakness as well.  She was admitted and started on high dose steroids, antihypertensives, and Keppra.  MRI brain confirmed the known hemorrhagic brain lesions, but Neurosurgery did not feel there was anything surgical.  She was able to complete the course of SRS and had some neurologic recovery, with no more seizure-like activity.  Her pathology returned showing a BRAF mutation, we were trying to screen her for the [phone] trial, but with the extent of her disease and the neurologic changes, we  felt that we did not have time to wait for immunotherapy effect and recommended upfront dabrafenib/trametinib.  [facility] went to [facility] and they agreed with the plan.  We also spent a great deal of time discussing long-term prognosis, which is limited by her metastatic disease which is incurable, but I explained that with BRAF/MEK inhibitors and with immunotherapy down the line, the potential for treatment response and prolongation of life was certainly realistic.            Here for follow-up.  Now on dabrafenib/trametinib for about 2.5 months.  She is doing well overall.  No recurrence of the acute episode of aphasia and weakness.  Currently on dex 4 mg BID with plans to drop to 4/2 today.  Overall feeling well, some abdominal bloating but no pain, no nausea, appetite is great.  No other sites of pain.  No headache.  Neurocognitive symptoms unchanged.  Tolerating Taf/Mek well.  She had a sinus infection over the weekend, improved with Augmentin.  Labs reviewed and adequate, LDH down to 328 which is encouraging.  Her clinical symptoms also suggest systemic disease response.  Recheck PET/CT in 4 weeks with clinic follow-up afterward.  Continue Taf/Mek as planned.  All questions were asked and answered to the best of my ability.              Greer Ee, MD  [facility] [place] [address]  Office : [phone]  Fax : [phone]

## 2017-02-10 ENCOUNTER — Encounter: Attending: Internal Medicine | Primary: Family Medicine

## 2017-02-10 ENCOUNTER — Encounter: Primary: Family Medicine

## 2017-02-12 NOTE — Progress Notes (Signed)
I have received an approval for a PET/CT cpt code [phone] for [facility] 02/18/17. The approval is good from 02/11/17 to 03/28/17, I spoke with Alvester Morin @ [phone] to let her know.  Scanned into media.

## 2017-02-15 NOTE — Telephone Encounter (Signed)
Called to see if Mom can take Vitamin C IV. Discussed with clinical pharmacist Ria Comment. Patient taking Taflinar and mekinist orally. Ria Comment states that basically the Vitamin C will repair damaged cells, whereas our objective now with the chemotherapy is to damage the cells, therefore the vitamin c could start repairing the cells we are working on. Discussed this with the daughter who verbalizes understanding

## 2017-02-18 ENCOUNTER — Inpatient Hospital Stay: Admit: 2017-02-18 | Payer: PRIVATE HEALTH INSURANCE | Primary: Family Medicine

## 2017-02-18 ENCOUNTER — Telehealth

## 2017-02-18 DIAGNOSIS — C7931 Secondary malignant neoplasm of brain: Secondary | ICD-10-CM

## 2017-02-18 MED ORDER — F-18 FLUORODEOXYGLUCOSE
Freq: Once | Status: AC
Start: 2017-02-18 — End: 2017-02-18
  Administered 2017-02-18: 13:00:00 via INTRAVENOUS

## 2017-02-18 MED ORDER — TRAMADOL 50 MG TAB
50 mg | ORAL_TABLET | Freq: Four times a day (QID) | ORAL | 0 refills | Status: DC | PRN
Start: 2017-02-18 — End: 2017-04-20

## 2017-02-18 MED ORDER — DIATRIZOATE MEGLUMINE & SODIUM 66 %-10 % ORAL SOLN
66-10 % | Freq: Once | ORAL | Status: AC
Start: 2017-02-18 — End: 2017-02-18
  Administered 2017-02-18: 13:00:00 via ORAL

## 2017-02-18 NOTE — Telephone Encounter (Signed)
Patient daughter wanted to report patient has a new onset of right back pain, mid to low back.  Her husband gave her an Advil, she is here for her PET.    Patient daughter calling back now that they are home to report patient is very uncomfortable with the discomfort to her back and can't get comfortable.  They will use heat and take another advil while symptoms are reviewed with provider.    Per Chales Abrahams, NP, will send script for Ultram.

## 2017-02-24 ENCOUNTER — Inpatient Hospital Stay: Admit: 2017-02-24 | Payer: PRIVATE HEALTH INSURANCE | Primary: Family Medicine

## 2017-02-24 ENCOUNTER — Ambulatory Visit: Admit: 2017-02-24 | Discharge: 2017-02-25 | Payer: PRIVATE HEALTH INSURANCE | Primary: Family Medicine

## 2017-02-24 ENCOUNTER — Encounter

## 2017-02-24 ENCOUNTER — Ambulatory Visit
Admit: 2017-02-24 | Discharge: 2017-02-24 | Payer: PRIVATE HEALTH INSURANCE | Attending: Internal Medicine | Primary: Family Medicine

## 2017-02-24 DIAGNOSIS — C7931 Secondary malignant neoplasm of brain: Secondary | ICD-10-CM

## 2017-02-24 DIAGNOSIS — R530 Neoplastic (malignant) related fatigue: Secondary | ICD-10-CM

## 2017-02-24 LAB — CBC WITH AUTOMATED DIFF
ABS. BASOPHILS: 0 10*3/uL (ref 0.0–0.2)
ABS. EOSINOPHILS: 0 10*3/uL (ref 0.0–0.8)
ABS. LYMPHOCYTES: 1.1 10*3/uL (ref 0.5–4.6)
ABS. MONOCYTES: 0.2 10*3/uL (ref 0.1–1.3)
ABS. NEUTROPHILS: 3.1 10*3/uL (ref 1.7–8.2)
ABSOLUTE NRBC: 0 10*3/uL (ref 0.0–0.2)
BASOPHILS: 0 % (ref 0.0–2.0)
EOSINOPHILS: 0 % — ABNORMAL LOW (ref 0.5–7.8)
HCT: 40.5 % (ref 35.8–46.3)
HGB: 14.2 g/dL (ref 11.7–15.4)
LYMPHOCYTES: 25 % (ref 13–44)
MCH: 30.4 PG (ref 26.1–32.9)
MCHC: 35.1 g/dL — ABNORMAL HIGH (ref 31.4–35.0)
MCV: 86.7 FL (ref 79.6–97.8)
MONOCYTES: 5 % (ref 4.0–12.0)
MPV: 9.5 FL — ABNORMAL LOW (ref 10.8–14.1)
NEUTROPHILS: 70 % (ref 43–78)
PLATELET: 183 10*3/uL (ref 150–450)
RBC: 4.67 M/uL (ref 4.05–5.25)
RDW: 15.5 % — ABNORMAL HIGH (ref 11.9–14.6)
WBC: 4.5 10*3/uL (ref 4.3–11.1)

## 2017-02-24 LAB — METABOLIC PANEL, COMPREHENSIVE
A-G Ratio: 0.7 — ABNORMAL LOW (ref 1.2–3.5)
ALT (SGPT): 142 U/L — ABNORMAL HIGH (ref 12–65)
AST (SGOT): 127 U/L — ABNORMAL HIGH (ref 15–37)
Albumin: 2.6 g/dL — ABNORMAL LOW (ref 3.2–4.6)
Alk. phosphatase: 176 U/L — ABNORMAL HIGH (ref 50–136)
Anion gap: 6 mmol/L — ABNORMAL LOW (ref 7–16)
BUN: 10 MG/DL (ref 8–23)
Bilirubin, total: 0.4 MG/DL (ref 0.2–1.1)
CO2: 32 mmol/L (ref 21–32)
Calcium: 8.9 MG/DL (ref 8.3–10.4)
Chloride: 96 mmol/L — ABNORMAL LOW (ref 98–107)
Creatinine: 0.82 MG/DL (ref 0.6–1.0)
GFR est AA: >60 mL/min/{1.73_m2} (ref >60)
GFR est non-AA: >60 mL/min/{1.73_m2} (ref >60)
Globulin: 3.5 g/dL (ref 2.3–3.5)
Glucose: 176 mg/dL — ABNORMAL HIGH (ref 65–100)
Potassium: 3.2 mmol/L — ABNORMAL LOW (ref 3.5–5.1)
Protein, total: 6.1 g/dL — ABNORMAL LOW (ref 6.3–8.2)
Sodium: 134 mmol/L — ABNORMAL LOW (ref 136–145)

## 2017-02-24 LAB — MAGNESIUM: Magnesium: 2.2 mg/dL (ref 1.8–2.4)

## 2017-02-24 LAB — LD: LD: 655 U/L — ABNORMAL HIGH (ref 100–190)

## 2017-02-24 NOTE — Patient Instructions (Signed)
Patient Instructions From Your Nurse    Reason for Visit:  Follow up visit    Plan:  -    Follow Up:  Follow up with Dr. Jenetta Loges or NP in     Recent Lab Results:  Recent Results (from the past 12 hour(s))   CBC WITH AUTOMATED DIFF    Collection Time: 02/24/17  2:50 PM   Result Value Ref Range    WBC 4.5 4.3 - 11.1 K/uL    RBC 4.67 4.05 - 5.25 M/uL    HGB 14.2 11.7 - 15.4 g/dL    HCT 40.5 35.8 - 46.3 %    MCV 86.7 79.6 - 97.8 FL    MCH 30.4 26.1 - 32.9 PG    MCHC 35.1 (H) 31.4 - 35.0 g/dL    RDW 15.5 (H) 11.9 - 14.6 %    PLATELET 183 150 - 450 K/uL    MPV 9.5 (L) 10.8 - 14.1 FL    ABSOLUTE NRBC 0.00 0.0 - 0.2 K/uL    DF AUTOMATED      NEUTROPHILS 70 43 - 78 %    LYMPHOCYTES 25 13 - 44 %    MONOCYTES 5 4.0 - 12.0 %    EOSINOPHILS 0 (L) 0.5 - 7.8 %    BASOPHILS 0 0.0 - 2.0 %    ABS. NEUTROPHILS 3.1 1.7 - 8.2 K/UL    ABS. LYMPHOCYTES 1.1 0.5 - 4.6 K/UL    ABS. MONOCYTES 0.2 0.1 - 1.3 K/UL    ABS. EOSINOPHILS 0.0 0.0 - 0.8 K/UL    ABS. BASOPHILS 0.0 0.0 - 0.2 K/UL         Care plan has been discussed and given to patient: n/a        -------------------------------------------------------------------------------------------------------------------  Please call our office at [phone] if you have any  of the following symptoms:   ?? Fever of 100.5 or greater  ?? Chills  ?? Shortness of breath  ?? Swelling or pain in one leg    After office hours an answering service is available and will contact a provider for emergencies or if you are experiencing any of the above symptoms.    ? Patient did express an interest in My Chart.  My Chart log in information explained on the after visit summary printout at the [place] desk.    Salley Slaughter. Bobby Rumpf, RN

## 2017-02-24 NOTE — Progress Notes (Signed)
[facility]: Office Visit Established Patient    Chief Complaint:    Chief Complaint   Patient presents with   ??? Follow-up     Metastatic melanoma of brain         History of Present Illness:  Katelyn [name] is a 60 y.o. 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  She has a history of osteopenia, hypertension, hypercholesterolemia, and melanoma of the back s/p excision in 2008, sentinel node was negative but she is unsure what T stage.  She initially presented to her PCP on 10/14/16 reporting a one month history of intermittent upper abdominal pain with loss of appetite and right scapular pain radiating to her right arm.  Ultrasound of the abdomen, obtained on 10/15/16, identified multiple lesions in the liver, concerning for metastatic disease, as well as small hypoechoic areas near the tail of the pancreas, possibly adenopathy.  Further imaging was performed with CT scan of the chest, this showed hepatic and pulmonary lesions highly concerning for metastatic disease.  Lung nodules measured up to 12 mm and liver lesions measured up to 25 mm.  She was referred for urgent oncologic consultation, and liver biopsy was definitive for metastatic melanoma, and PET confirmed the multiple lesions in the liver, lungs and lymph nodes.  There were also suspicious changes in the visualized portion of the brain.  We ordered a STAT MRI which confirmed multiple brain lesions, prompting [place] to initiate steroids and refer to radiation oncology.  She completed CyberKnife radiation to the brain lesions, but the day of the first CyberKnife treatment, she was noted to have significant tremor, almost to the point of suggesting some possible partial seizure activity.  She had significant left sided weakness as well.  She was admitted and started on high dose steroids, antihypertensives, and Keppra.  MRI brain confirmed  the known hemorrhagic brain lesions, but Neurosurgery did not feel there was anything surgical.  She was able to complete the course of SRS and had some neurologic recovery, with no more seizure-like activity.  Her pathology returned showing a BRAF mutation, we were trying to screen her for the [id number] trial, but with the extent of her disease and the neurologic changes, we felt that we did not have time to wait for immunotherapy effect and recommended upfront dabrafenib/trametinib.  She went to [facility] and they agreed with the plan.  We also spent a great deal of time discussing long-term prognosis, which is limited by her metastatic disease which is incurable, but I explained that with BRAF/MEK inhibitors and with immunotherapy down the line, the potential for treatment response and prolongation of life was certainly realistic.            Here for follow-up.  Now on dabrafenib/trametinib for about 3 months.  She is doing well overall.  No recurrence of episodes of aphasia and weakness.  Currently on dex 4 mg in the morning, 2 mg in the evening.  She is frustrated because she continues to have some neurocognitive impairment which waxes and wanes.  She does not feel like overall this is improving.  No systemic symptoms, no pain, no nausea, appetite is good.  Tolerating Taf/Mek well.       Review of Systems:  Constitutional: Positive for fatigue.   HENT: Negative.   Eyes: Negative.   Respiratory: Negative.   Cardiovascular: Negative.   Gastrointestinal: Negative.   Genitourinary: Negative.   Musculoskeletal: Negative.   Skin:  Negative.   Neurological: Positive for confusion.   Endo/Heme/Allergies: Negative.   Psychiatric/Behavioral: Negative.   All other systems reviewed and are negative.     Allergies   Allergen Reactions   ??? Adhesive Tape-Silicones Rash     Past Medical History:   Diagnosis Date   ??? Cancer ([place])     melanoma - back   ??? Hypercholesterolemia    ??? Hypertension     managed with meds    ??? Nausea & vomiting    ??? Osteopenia      Past Surgical History:   Procedure Laterality Date   ??? HX COLONOSCOPY     ??? HX GYN      c-section x 5   ??? HX ORTHOPAEDIC Left 04/30/2016    knee   ??? HX OTHER SURGICAL      melanoma removed from back     Family History   Problem Relation Age of Onset   ??? Hypertension Mother    ??? Elevated Lipids Mother    ??? Elevated Lipids Father    ??? Hypertension Father    ??? Arthritis-osteo Father      spine   ??? Heart Disease Father    ??? Breast Cancer Other    ??? Diabetes Sister      Social History     Social History   ??? Marital status: MARRIED     Spouse name: N/A   ??? Number of children: N/A   ??? Years of education: N/A     Occupational History   ??? Not on file.     Social History Main Topics   ??? Smoking status: Never Smoker   ??? Smokeless tobacco: Never Used   ??? Alcohol use No   ??? Drug use: Not on file   ??? Sexual activity: Not on file     Other Topics Concern   ??? Not on file     Social History Narrative     Current Outpatient Prescriptions   Medication Sig Dispense Refill   ??? metoprolol tartrate (LOPRESSOR) 25 mg tablet Take 12.5 mg by mouth.     ??? losartan (COZAAR) 100 mg tablet Take 100 mg by mouth.     ??? levETIRAcetam (KEPPRA) 500 mg tablet Take 1 Tab by mouth two (2) times a day. 60 Tab 11   ??? LORazepam (ATIVAN) 1 mg tablet Take 1 Tab by mouth nightly as needed (insomnia). Max Daily Amount: 1 mg. 30 Tab 0   ??? dabrafenib (TAFINLAR) 75 mg cap Take 2 Caps by mouth two (2) times a day. 120 Cap 3   ??? trametinib (MEKINIST) 2 mg tab Take 1 Tab by mouth daily. 30 Tab 3   ??? dexamethasone (DECADRON) 4 mg tablet Take 1 Tab by mouth two (2) times daily (with meals). (Patient taking differently: Take 4 mg by mouth two (2) times daily (with meals). 4 mg one time a day and '2mg'$  once a day) 60 Tab 2   ??? simvastatin (ZOCOR) 20 mg tablet Take 1 Tab by mouth nightly. 90 Tab 3   ??? amLODIPine (NORVASC) 10 mg tablet Take 1 Tab by mouth nightly. 90 Tab 3   ??? cetirizine (ZYRTEC) 10 mg tablet Take  by mouth.      ??? traMADol (ULTRAM) 50 mg tablet Take 1 Tab by mouth every six (6) hours as needed for Pain. Max Daily Amount: 200 mg. Indications: Pain 60 Tab 0   ??? acetaminophen (TYLENOL) 325 mg tablet Take 650 mg  by mouth.     ??? HYDROcodone-acetaminophen (NORCO) 7.5-325 mg per tablet Take 1 Tab by mouth every four (4) hours as needed. Max Daily Amount: 6 Tabs. 20 Tab 0   ??? ferrous sulfate (IRON) 325 mg (65 mg iron) tablet Take  by mouth Daily (before breakfast).     ??? fluticasone (FLONASE) 50 mcg/actuation nasal spray 2 Sprays by Both Nostrils route nightly.     ??? calcium-cholecalciferol, d3, (CALCIUM 600 + D) 600-125 mg-unit tab Take  by mouth three (3) times daily.         OBJECTIVE:  Visit Vitals   ??? BP (!) 130/95  Comment: standing   ??? Pulse 86   ??? Temp 98 ??F (36.7 ??C) (Oral)   ??? Resp 21   ??? Ht '5\' 7"'$  (1.702 m)   ??? Wt 152 lb (68.9 kg)   ??? SpO2 95%   ??? BMI 23.81 kg/m2       Physical Exam:  Constitutional: Well developed, well nourished female in no acute distress, sitting comfortably on the examination table.    HEENT: Normocephalic and atraumatic. Sclerae anicteric.    Skin Warm and dry.  No bruising and no rash noted.  No erythema.  No pallor.    Cardiopulmonary Deferred.   Neuro Grossly nonfocal with no obvious sensory or motor deficits.   MSK Normal range of motion in general.  No edema and no tenderness.   Psych Appropriate mood and affect.             Labs:  Recent Results (from the past 336 hour(s))   CBC WITH AUTOMATED DIFF    Collection Time: 02/24/17  2:50 PM   Result Value Ref Range    WBC 4.5 4.3 - 11.1 K/uL    RBC 4.67 4.05 - 5.25 M/uL    HGB 14.2 11.7 - 15.4 g/dL    HCT 40.5 35.8 - 46.3 %    MCV 86.7 79.6 - 97.8 FL    MCH 30.4 26.1 - 32.9 PG    MCHC 35.1 (H) 31.4 - 35.0 g/dL    RDW 15.5 (H) 11.9 - 14.6 %    PLATELET 183 150 - 450 K/uL    MPV 9.5 (L) 10.8 - 14.1 FL    ABSOLUTE NRBC 0.00 0.0 - 0.2 K/uL    DF AUTOMATED      NEUTROPHILS 70 43 - 78 %    LYMPHOCYTES 25 13 - 44 %    MONOCYTES 5 4.0 - 12.0 %     EOSINOPHILS 0 (L) 0.5 - 7.8 %    BASOPHILS 0 0.0 - 2.0 %    ABS. NEUTROPHILS 3.1 1.7 - 8.2 K/UL    ABS. LYMPHOCYTES 1.1 0.5 - 4.6 K/UL    ABS. MONOCYTES 0.2 0.1 - 1.3 K/UL    ABS. EOSINOPHILS 0.0 0.0 - 0.8 K/UL    ABS. BASOPHILS 0.0 0.0 - 0.2 K/UL   METABOLIC PANEL, COMPREHENSIVE    Collection Time: 02/24/17  2:50 PM   Result Value Ref Range    Sodium 134 (L) 136 - 145 mmol/L    Potassium 3.2 (L) 3.5 - 5.1 mmol/L    Chloride 96 (L) 98 - 107 mmol/L    CO2 32 21 - 32 mmol/L    Anion gap 6 (L) 7 - 16 mmol/L    Glucose 176 (H) 65 - 100 mg/dL    BUN 10 8 - 23 MG/DL    Creatinine 0.82 0.6 - 1.0 MG/DL  GFR est AA >60 >60 ml/min/1.32m    GFR est non-AA >60 >60 ml/min/1.738m   Calcium 8.9 8.3 - 10.4 MG/DL    Bilirubin, total 0.4 0.2 - 1.1 MG/DL    ALT (SGPT) 142 (H) 12 - 65 U/L    AST (SGOT) 127 (H) 15 - 37 U/L    Alk. phosphatase 176 (H) 50 - 136 U/L    Protein, total 6.1 (L) 6.3 - 8.2 g/dL    Albumin 2.6 (L) 3.2 - 4.6 g/dL    Globulin 3.5 2.3 - 3.5 g/dL    A-G Ratio 0.7 (L) 1.2 - 3.5     MAGNESIUM    Collection Time: 02/24/17  2:50 PM   Result Value Ref Range    Magnesium 2.2 1.8 - 2.4 mg/dL   LD    Collection Time: 02/24/17  2:50 PM   Result Value Ref Range    LD 655 (H) 100 - 190 U/L        Imaging:  PET/CT: 10/29/2016  ??  INDICATION: Liver lesions. History melanoma in 2008.  ??  TECHNIQUE: After oral administration of gastroview and intravenous  administration of 15.44 mCi of F18 FDG, noncontrast CT images were obtained for  attenuation correction and for fusion with emission PET images. A series of  overlapping emission PET images were then obtained beginning 60 minutes after  injection of FDG. The area imaged spanned the region from the skull base to the  mid thighs.  ??  COMPARISON: CT chest, abdomen, and pelvis 10/15/2016  ??  FINDINGS:   NECK/CHEST:  Abnormal hyperdense brain masses are seen located in the left posterior temporal  lobe seen CT image 30 measuring 2.7 cm x 2.4 cm, and in the right frontal lobe   on image 28 measuring 1.2 cm x 0.8 cm. It is uncertain whether these represent  subacute spontaneous hemorrhages, hyperdense intracranial metastases, or  metastases which have subacutely hemorrhage. Mild abnormal PET activity is  favored to correspond to these findings both seen on PET image 302 and therefore  hyperdense intracranial metastases or intracranial metastases which have  subacutely hemorrhage are favored. These should be further assessed with a  contrasted brain MRI. Additional edema seen in the right parietal lobe on image  21 without a definite hyperdense abnormality.  ??  No abnormal activity is seen in the neck. No adenopathy is seen.  ??  Abnormal activity is seen associated with prevascular lymph nodes the largest of  which is seen on image 109 measuring 1.3 cm x 1.1 cm concerning for metastatic  mediastinal adenopathy. In addition, abnormal activity is seen associated with  the previously described bilateral pulmonary nodules with the largest nodule  located in the medial right lower lobe on image 138 measuring 1.9 cm x 1.4 cm  consistent with pulmonary metastases. A subcutaneous lesion is seen in the left  back soft tissues on image 140 measuring 1.4 cm x 0.9 cm which demonstrates mild  activity raising concern for potential subcutaneous lesion such as recurrent  melanoma.  ??  ABDOMEN/PELVIS:  Abnormal activity is seen associated with previous as described hepatic lesions  consistent with hepatic metastases. The largest lesion is felt to reside in the  right lobe seen on image 151 measuring 3.5 cm x 3.2 cm. No worrisome activity is  otherwise seen. There is a 5 mm subcutaneous lesion in the right anterior  abdominal wall seen on CT image 202. No abnormal activity is seen associated  with this  lesion although activity is likely underestimate given its small size.  An additional melanoma cannot be excluded. A gallstone is seen within a  noninflamed gallbladder.  ??   Only physiologic activity is seen in the pelvis.  ??  LEGS:  No hypermetabolic lesion or worrisome subcutaneous kidneys lesion is seen in the  lower extremities.  ??  IMPRESSION  IMPRESSION:   1. Small subcutaneous lesions in the left posterior back, and right anterior  abdominal wall whose appearance is concerning for recurrent melanoma.  ??  2. Abnormal activity associated with prior hepatic and pulmonary lesions  consistent with metastatic lesions. In addition, abnormal activity is seen  associated with mediastinal lymph nodes consistent with additional nodal  metastatic disease. Lastly, there are abnormal hyperdense masses in the brain  which demonstrate mild activity raising concern for intracranial metastases.  Given the hyperdense appearance, the possibility of subacute hemorrhage cannot  be excluded. These should be further assessed with a contrasted MRI of the  Brain.    Results for orders placed during the hospital encounter of 10/15/16   CT CHEST ABD PELV W CONT    Narrative CT SCAN OF THE CHEST WITH CONTRAST: 10/15/2016  Indication: Loss of appetite  Comparison: None   100cc of Isovue 350  was used in the detection of thoracic pathology.  Dose reduction techniques used: Automated exposure control, adjustment of the  mAs and/or kVp according to patient size, standardized low-dose protocol, and/or  iterative reconstruction technique.  Findings: Tracheobronchial tree is intact. There are multiple noncalcified  bilateral lung nodules of which the largest is in the left upper lobe measuring  12 mm. Thyroid is unremarkable. There is no significant mediastinal adenopathy.  Bony structures and the soft tissues are intact    CT scan the abdomen with contrast:    There are multiple low-density lesions throughout the hepatic parenchyma of  which the largest is in the left lobe measuring 20 mm x 25 mm. Gallstones are  present. Adrenal glands, pancreas, spleen and spleen is unremarkable. Right   kidney is unremarkable. There are multiple subcentimeter left renal cyst. Aorta,  small and large bowel, appendix, soft tissues, and the bony structures are  unremarkable. There is no significant adenopathy.    CT scan of pelvis with contrast:    The uterus, adnexal structures, bladder, soft tissues and the bony structures  are unremarkable for mass lesions.      Impression IMPRESSION:: Gallstones, small left renal cysts, hepatic and pulmonary  metastatic lesions     Pathology:        ASSESSMENT:    ICD-10-CM ICD-9-CM    1. 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HCC) C79.31 198.3    2. 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 (HCC) C78.7 197.7      172.9    3. 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    Melanoma: metastatic to liver, lungs, nodes, and also concern for brain lesions on CT imaging from PET.  History of melanoma in 2008 resected from the back.  Symptoms including fatigue, malaise, anorexia, weight loss, blurry vision, mild intermittent confusion.  Her liver biopsy was definitive for metastatic melanoma, and PET confirmed the multiple lesions in the liver, lungs and lymph nodes.  There were also suspicious changes  in the visualized portion of the brain.  We ordered a STAT MRI which confirmed multiple brain lesions, prompting [place] to initiate steroids and refer to radiation oncology.  She completed CyberKnife radiation to the brain lesions, but the day of the first CyberKnife treatment, she was noted to have significant tremor, almost to the point of suggesting some possible partial seizure activity.  She had significant left sided weakness as well.  She was admitted and started on high dose steroids, antihypertensives, and Keppra.  MRI brain confirmed the known hemorrhagic brain lesions, but Neurosurgery did not feel there was anything surgical.  She was able to complete the course of SRS and had some neurologic recovery, with no more seizure-like activity.  Her pathology returned showing a BRAF mutation, we were trying to screen her for the [phone] trial, but with the extent of her disease and the neurologic changes, we felt that we did not have time to wait for immunotherapy effect and recommended upfront dabrafenib/trametinib.  [facility] went to [facility] and they agreed with the plan.  We also spent a great deal of time discussing long-term prognosis, which is limited by her metastatic disease which is incurable, but I explained that with BRAF/MEK inhibitors and with immunotherapy down the line, the potential for treatment response and prolongation of life was certainly realistic.            Here for follow-up.  Now on dabrafenib/trametinib for about 3 months.  She is doing well overall.  No recurrence of episodes of aphasia and weakness.  Currently on dex 4 mg in the morning, 2 mg in the evening.  She is frustrated because she continues to have some neurocognitive impairment which waxes and wanes.  She does not feel like overall this is improving.  No systemic symptoms, no pain, no nausea, appetite is good.  Tolerating Taf/Mek well.  PET/CT reviewed and shows dramatic systemic response, the  liver and lung lesions have essentially resolved.  Her MRI of the brain was done today and shows a mixed response, Dr. Maceo Pro feels this may be pseudoprogression, he will present to the [facility] neuro tumor board for input and discussion of possible biopsy/decompression.  Labs reviewed and OK, her AST/ALT are a little elevated, I do not have an explanation for this, continue monitoring.  Likewise her LDH is up to 655, from 328.  Recheck PET/CT in 3 months barring clinical changes, we will follow her LFTs and LDH closely, the concern with BRAF/MEK inhibitor therapy is that patients can have dramatic responses, but they can be short-lived.  Change to immunotherapy (ipi/nivo) with progression.   Continue Taf/Mek as planned for now.  All questions were asked and answered to the best of  my ability.              Greer Ee, MD  [facility] [place] [place] [address]  Office : [phone]  Fax : [phone]

## 2017-02-24 NOTE — Progress Notes (Signed)
Outpatient Palliative Care at the  [place]: Office Visit New Patient H & P    Chief Complaint:    Chief Complaint   Patient presents with   ??? Fatigue   ??? Altered mental status     Diagnosis: metastatic melanoma    Treatment Plan: s/p XRT; currently on Taflinar/Mekinist    Treatment Intent: palliative    Medical Oncologist: Dr. Jenetta Loges    Radiation Oncologist: Dr. Maceo Pro    History of Present Illness:  Katelyn [name] is a 61 y.o. female who presents today for evaluation regarding pain and symptom management, introduction to palliative care in the setting of metastatic melanoma with brain, liver, lung involvement.  Pt was initially diagnosed 09/2016.  Pt brain lesions were confirmed hemorrhagic, ut neurosurgery di not feel there was need for surgical intervention.  She did compelted SRS with some neurologic recovery.  Pathology showed BRAF mutation.  She started dabrafenib/trametinib with this plan confirmed by 2nd opinion at [facility].  Pt has been on dabrafenib/trametinib for 2.5 months. She is somewhat frustrated by ongoing neurologic deficits.  Has waxing/waning difficulty finding words, has had one fall with no injury.  She is fatigued but is able to walk outside routinely.  Pt had PET scan with evidence of resolution of the hypermetabolic activity of the liver lesions and of the lesion in chest.  There appears to be enlargement of brain lesions, but there is the possibility of pseudo-progression vs actual progression.    Review of Systems:  Review of Systems   Constitutional: Positive for malaise/fatigue.   HENT: Negative.    Eyes: Negative.    Respiratory: Negative.    Cardiovascular: Negative.    Gastrointestinal: Negative.    Genitourinary: Negative.    Musculoskeletal: Positive for back pain.        Resolved   Skin: Negative.    Neurological: Positive for speech change.   Endo/Heme/Allergies: Negative.    Psychiatric/Behavioral: Positive for memory loss.         Allergies   Allergen Reactions    ??? Adhesive Tape-Silicones Rash     Past Medical History:   Diagnosis Date   ??? Cancer ([place])     melanoma - back   ??? Hypercholesterolemia    ??? Hypertension     managed with meds   ??? Nausea & vomiting    ??? Osteopenia      Past Surgical History:   Procedure Laterality Date   ??? HX COLONOSCOPY     ??? HX GYN      c-section x 5   ??? HX ORTHOPAEDIC Left 04/30/2016    knee   ??? HX OTHER SURGICAL      melanoma removed from back     Family History   Problem Relation Age of Onset   ??? Hypertension Mother    ??? Elevated Lipids Mother    ??? Elevated Lipids Father    ??? Hypertension Father    ??? Arthritis-osteo Father      spine   ??? Heart Disease Father    ??? Breast Cancer Other    ??? Diabetes Sister      Social History     Social History   ??? Marital status: MARRIED     Spouse name: N/A   ??? Number of children: N/A   ??? Years of education: N/A     Occupational History   ??? Not on file.     Social History Main Topics   ???  Smoking status: Never Smoker   ??? Smokeless tobacco: Never Used   ??? Alcohol use No   ??? Drug use: Not on file   ??? Sexual activity: Not on file     Other Topics Concern   ??? Not on file     Social History Narrative     Current Outpatient Prescriptions   Medication Sig Dispense Refill   ??? metoprolol tartrate (LOPRESSOR) 25 mg tablet Take 12.5 mg by mouth.     ??? traMADol (ULTRAM) 50 mg tablet Take 1 Tab by mouth every six (6) hours as needed for Pain. Max Daily Amount: 200 mg. Indications: Pain 60 Tab 0   ??? losartan (COZAAR) 100 mg tablet Take 100 mg by mouth.     ??? acetaminophen (TYLENOL) 325 mg tablet Take 650 mg by mouth.     ??? HYDROcodone-acetaminophen (NORCO) 7.5-325 mg per tablet Take 1 Tab by mouth every four (4) hours as needed. Max Daily Amount: 6 Tabs. 20 Tab 0   ??? levETIRAcetam (KEPPRA) 500 mg tablet Take 1 Tab by mouth two (2) times a day. 60 Tab 11   ??? LORazepam (ATIVAN) 1 mg tablet Take 1 Tab by mouth nightly as needed (insomnia). Max Daily Amount: 1 mg. 30 Tab 0    ??? dabrafenib (TAFINLAR) 75 mg cap Take 2 Caps by mouth two (2) times a day. 120 Cap 3   ??? trametinib (MEKINIST) 2 mg tab Take 1 Tab by mouth daily. 30 Tab 3   ??? dexamethasone (DECADRON) 4 mg tablet Take 1 Tab by mouth two (2) times daily (with meals). (Patient taking differently: Take 4 mg by mouth two (2) times daily (with meals). 4 mg one time a day and 6m once a day) 60 Tab 2   ??? simvastatin (ZOCOR) 20 mg tablet Take 1 Tab by mouth nightly. 90 Tab 3   ??? amLODIPine (NORVASC) 10 mg tablet Take 1 Tab by mouth nightly. 90 Tab 3   ??? ferrous sulfate (IRON) 325 mg (65 mg iron) tablet Take  by mouth Daily (before breakfast).     ??? fluticasone (FLONASE) 50 mcg/actuation nasal spray 2 Sprays by Both Nostrils route nightly.     ??? calcium-cholecalciferol, d3, (CALCIUM 600 + D) 600-125 mg-unit tab Take  by mouth three (3) times daily.     ??? cetirizine (ZYRTEC) 10 mg tablet Take  by mouth.         OBJECTIVE:       Vitals 02/24/2017   Blood Pressure 142/107   Pulse 80   Temp 98   Resp 21   Height '5\' 7"'    Weight 152 lb   SpO2 95   BSA 1.8 m2   BMI 23.81 kg/m2     Physical Exam:  Constitutional: Well developed, well nourished female in no acute distress. Daughters x2 and spouse present.   HEENT: Normocephalic and atraumatic. Oropharynx is clear, mucous membranes are moist.  Pupils are equal, round, and reactive to light. Extraocular muscles are intact.  Sclerae anicteric. Neck supple without JVD.   Lymph node   deferred   Skin Warm and dry.  No bruising and no rash noted.  No erythema.  No pallor.    Respiratory Lungs are clear to auscultation bilaterally without wheezes, rales or rhonchi, unlabored respiratory effort.    CVS Normal rate, regular rhythm and normal S1 and S2.  No murmurs, gallops, or rubs.   Abdomen Soft, nontender and nondistended, normoactive bowel sounds.  No palpable mass.  No hepatosplenomegaly.   Neuro Intermittent delayed word finding; alert and oriented x3 throughout visit.  Intermittent memory deficit.    MSK Normal range of motion in general.  No edema and no tenderness.   Psych Appropriate mood and affect.        Labs:  Recent Results (from the past 24 hour(s))   CBC WITH AUTOMATED DIFF    Collection Time: 02/24/17  2:50 PM   Result Value Ref Range    WBC 4.5 4.3 - 11.1 K/uL    RBC 4.67 4.05 - 5.25 M/uL    HGB 14.2 11.7 - 15.4 g/dL    HCT 40.5 35.8 - 46.3 %    MCV 86.7 79.6 - 97.8 FL    MCH 30.4 26.1 - 32.9 PG    MCHC 35.1 (H) 31.4 - 35.0 g/dL    RDW 15.5 (H) 11.9 - 14.6 %    PLATELET 183 150 - 450 K/uL    MPV 9.5 (L) 10.8 - 14.1 FL    ABSOLUTE NRBC 0.00 0.0 - 0.2 K/uL    DF AUTOMATED      NEUTROPHILS 70 43 - 78 %    LYMPHOCYTES 25 13 - 44 %    MONOCYTES 5 4.0 - 12.0 %    EOSINOPHILS 0 (L) 0.5 - 7.8 %    BASOPHILS 0 0.0 - 2.0 %    ABS. NEUTROPHILS 3.1 1.7 - 8.2 K/UL    ABS. LYMPHOCYTES 1.1 0.5 - 4.6 K/UL    ABS. MONOCYTES 0.2 0.1 - 1.3 K/UL    ABS. EOSINOPHILS 0.0 0.0 - 0.8 K/UL    ABS. BASOPHILS 0.0 0.0 - 0.2 K/UL   METABOLIC PANEL, COMPREHENSIVE    Collection Time: 02/24/17  2:50 PM   Result Value Ref Range    Sodium 134 (L) 136 - 145 mmol/L    Potassium 3.2 (L) 3.5 - 5.1 mmol/L    Chloride 96 (L) 98 - 107 mmol/L    CO2 32 21 - 32 mmol/L    Anion gap 6 (L) 7 - 16 mmol/L    Glucose 176 (H) 65 - 100 mg/dL    BUN 10 8 - 23 MG/DL    Creatinine 0.82 0.6 - 1.0 MG/DL    GFR est AA >60 >60 ml/min/1.23m    GFR est non-AA >60 >60 ml/min/1.773m   Calcium 8.9 8.3 - 10.4 MG/DL    Bilirubin, total 0.4 0.2 - 1.1 MG/DL    ALT (SGPT) 142 (H) 12 - 65 U/L    AST (SGOT) 127 (H) 15 - 37 U/L    Alk. phosphatase 176 (H) 50 - 136 U/L    Protein, total 6.1 (L) 6.3 - 8.2 g/dL    Albumin 2.6 (L) 3.2 - 4.6 g/dL    Globulin 3.5 2.3 - 3.5 g/dL    A-G Ratio 0.7 (L) 1.2 - 3.5     MAGNESIUM    Collection Time: 02/24/17  2:50 PM   Result Value Ref Range    Magnesium 2.2 1.8 - 2.4 mg/dL   LD    Collection Time: 02/24/17  2:50 PM   Result Value Ref Range    LD 655 (H) 100 - 190 U/L       Imaging:   Results for orders placed during the hospital encounter of 10/15/16   CT CHEST ABD PELV W CONT    Narrative CT SCAN OF THE CHEST WITH CONTRAST: 10/15/2016  Indication: Loss of appetite  Comparison: None   100cc  of Isovue 350  was used in the detection of thoracic pathology.  Dose reduction techniques used: Automated exposure control, adjustment of the  mAs and/or kVp according to patient size, standardized low-dose protocol, and/or  iterative reconstruction technique.  Findings: Tracheobronchial tree is intact. There are multiple noncalcified  bilateral lung nodules of which the largest is in the left upper lobe measuring  12 mm. Thyroid is unremarkable. There is no significant mediastinal adenopathy.  Bony structures and the soft tissues are intact    CT scan the abdomen with contrast:    There are multiple low-density lesions throughout the hepatic parenchyma of  which the largest is in the left lobe measuring 20 mm x 25 mm. Gallstones are  present. Adrenal glands, pancreas, spleen and spleen is unremarkable. Right  kidney is unremarkable. There are multiple subcentimeter left renal cyst. Aorta,  small and large bowel, appendix, soft tissues, and the bony structures are  unremarkable. There is no significant adenopathy.    CT scan of pelvis with contrast:    The uterus, adnexal structures, bladder, soft tissues and the bony structures  are unremarkable for mass lesions.      Impression IMPRESSION:: Gallstones, small left renal cysts, hepatic and pulmonary  metastatic lesions     ASSESSMENT:    ICD-10-CM ICD-9-CM    1. Neoplastic malignant related fatigue R53.0 780.79    2. Cognitive deficits R41.89 294.9    3. 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HCC) C79.31 198.3    4. 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    Pertinent old records were reviewed from [facility].    Case discussed with Drs. Dyar and Maceo Pro.    1. Fatigue: encouraged activity as tolerated; recommend HH PT evaluation for gait instability/disturbances, falls, family education regarding transfers, medical equipment needs.  Pt has had [facility] PT/OT in the past    Pt family to meet with [place] this afternoon.  I do not know if [place] also provides home health services.  Family will call if they need MD orders for [facility] and or home based palliative care.  Dr. Maceo Pro to discuss pt case with neurosurgery regarding potential need for evacuation of tumor site.  Pt will continue Taflinar/Mekinist PO.    Advanced Care Planning Discussed: Explained the role of palliative care as part of pt treatment team.  Pt agreeable to visit.    Will follow up in: 1 month or  earlier as needed.    All questions were asked and answered to the best of my ability.  In all, I spent 60 minutes in the care of Katelyn [name] today, over 50% of which was in direct counseling and coordination of care about pain and symptom management, home health services.        Ashley Royalty, NP  Outpatient Palliative Care at the  [facility]  [address]  [place] [address]  Office : [phone]  Fax : [phone]

## 2017-02-28 ENCOUNTER — Emergency Department: Admit: 2017-02-28 | Payer: PRIVATE HEALTH INSURANCE | Primary: Family Medicine

## 2017-02-28 ENCOUNTER — Inpatient Hospital Stay: Admit: 2017-02-28 | Payer: PRIVATE HEALTH INSURANCE | Primary: Family Medicine

## 2017-02-28 ENCOUNTER — Inpatient Hospital Stay
Admit: 2017-02-28 | Discharge: 2017-02-28 | Disposition: A | Discharge status: Other Acute Inpatient Hospital | Payer: PRIVATE HEALTH INSURANCE | Attending: Emergency Medicine

## 2017-02-28 ENCOUNTER — Inpatient Hospital Stay
Admit: 2017-02-28 | Discharge: 2017-03-03 | Disposition: A | Discharge status: Home / Self Care | Payer: PRIVATE HEALTH INSURANCE | Source: Other Acute Inpatient Hospital | Attending: Hematology & Oncology | Admitting: Hematology & Oncology

## 2017-02-28 DIAGNOSIS — C7931 Secondary malignant neoplasm of brain: Secondary | ICD-10-CM

## 2017-02-28 DIAGNOSIS — R4701 Aphasia: Secondary | ICD-10-CM

## 2017-02-28 LAB — CBC WITH AUTOMATED DIFF
ABS. BASOPHILS: 0 10*3/uL (ref 0.0–0.2)
ABS. EOSINOPHILS: 0 10*3/uL (ref 0.0–0.8)
ABS. IMM. GRANS.: 0 10*3/uL (ref 0.0–0.5)
ABS. LYMPHOCYTES: 2.4 10*3/uL (ref 0.5–4.6)
ABS. MONOCYTES: 0.8 10*3/uL (ref 0.1–1.3)
ABS. NEUTROPHILS: 2.7 10*3/uL (ref 1.7–8.2)
BASOPHILS: 0 % (ref 0.0–2.0)
EOSINOPHILS: 1 % (ref 0.5–7.8)
HCT: 44.4 % (ref 35.8–46.3)
HGB: 15 g/dL (ref 11.7–15.4)
IMMATURE GRANULOCYTES: 1 % (ref 0.0–5.0)
LYMPHOCYTES: 41 % (ref 13–44)
MCH: 29.9 PG (ref 26.1–32.9)
MCHC: 33.8 g/dL (ref 31.4–35.0)
MCV: 88.4 FL (ref 79.6–97.8)
MONOCYTES: 13 % — ABNORMAL HIGH (ref 4.0–12.0)
MPV: 8.7 FL — ABNORMAL LOW (ref 10.8–14.1)
NEUTROPHILS: 44 % (ref 43–78)
PLATELET: 384 10*3/uL (ref 150–450)
RBC: 5.02 M/uL (ref 4.05–5.25)
RDW: 16.3 % — ABNORMAL HIGH (ref 11.9–14.6)
WBC: 5.9 10*3/uL (ref 4.3–11.1)

## 2017-02-28 LAB — EKG, 12 LEAD, INITIAL
Atrial Rate: 80 {beats}/min
Calculated P Axis: 19 degrees
Calculated R Axis: 58 degrees
Calculated T Axis: 50 degrees
P-R Interval: 188 ms
Q-T Interval: 388 ms
QRS Duration: 86 ms
QTC Calculation (Bezet): 447 ms
Ventricular Rate: 80 {beats}/min

## 2017-02-28 LAB — METABOLIC PANEL, COMPREHENSIVE
A-G Ratio: 0.8 — ABNORMAL LOW (ref 1.2–3.5)
ALT (SGPT): 202 U/L — ABNORMAL HIGH (ref 12–65)
AST (SGOT): 134 U/L — ABNORMAL HIGH (ref 15–37)
Albumin: 3.1 g/dL — ABNORMAL LOW (ref 3.2–4.6)
Alk. phosphatase: 174 U/L — ABNORMAL HIGH (ref 50–136)
Anion gap: 8 mmol/L (ref 7–16)
BUN: 18 MG/DL (ref 8–23)
Bilirubin, total: 0.5 MG/DL (ref 0.2–1.1)
CO2: 33 mmol/L — ABNORMAL HIGH (ref 21–32)
Calcium: 9.1 MG/DL (ref 8.3–10.4)
Chloride: 96 mmol/L — ABNORMAL LOW (ref 98–107)
Creatinine: 0.76 MG/DL (ref 0.6–1.0)
GFR est AA: >60 mL/min/{1.73_m2} (ref >60)
GFR est non-AA: >60 mL/min/{1.73_m2} (ref >60)
Globulin: 3.8 g/dL — ABNORMAL HIGH (ref 2.3–3.5)
Glucose: 118 mg/dL — ABNORMAL HIGH (ref 65–100)
Potassium: 3 mmol/L — ABNORMAL LOW (ref 3.5–5.1)
Protein, total: 6.9 g/dL (ref 6.3–8.2)
Sodium: 137 mmol/L (ref 136–145)

## 2017-02-28 LAB — POC TROPONIN: Troponin-I (POC): 0 ng/ml — ABNORMAL LOW (ref 0.02–0.05)

## 2017-02-28 LAB — POC LACTIC ACID: Lactic Acid (POC): 1.5 mmol/L (ref 0.5–1.9)

## 2017-02-28 LAB — EKG 12-LEAD
Atrial Rate: 80 {beats}/min
P Axis: 19 degrees
P-R Interval: 188 ms
Q-T Interval: 388 ms
QRS Duration: 86 ms
QTc Calculation (Bazett): 447 ms
R Axis: 58 degrees
T Axis: 50 degrees
Ventricular Rate: 80 {beats}/min

## 2017-02-28 MED ORDER — LORAZEPAM 1 MG TAB
1 mg | Freq: Every evening | ORAL | Status: DC | PRN
Start: 2017-02-28 — End: 2017-03-03
  Administered 2017-03-03: 03:00:00 via ORAL

## 2017-02-28 MED ORDER — SODIUM CHLORIDE 0.9 % IJ SYRG
INTRAMUSCULAR | Status: DC | PRN
Start: 2017-02-28 — End: 2017-02-28

## 2017-02-28 MED ORDER — SODIUM CHLORIDE 0.9 % IJ SYRG
Freq: Three times a day (TID) | INTRAMUSCULAR | Status: DC
Start: 2017-02-28 — End: 2017-02-28

## 2017-02-28 MED ORDER — PANTOPRAZOLE 40 MG IV SOLR
40 mg | Freq: Every day | INTRAVENOUS | Status: DC
Start: 2017-02-28 — End: 2017-03-03
  Administered 2017-03-01 – 2017-03-03 (×4): via INTRAVENOUS

## 2017-02-28 MED ORDER — FUROSEMIDE 10 MG/ML IJ SOLN
10 mg/mL | INTRAMUSCULAR | Status: AC
Start: 2017-02-28 — End: 2017-02-28
  Administered 2017-02-28: 15:00:00 via INTRAVENOUS

## 2017-02-28 MED ORDER — LOSARTAN 50 MG TAB
50 mg | Freq: Every day | ORAL | Status: DC
Start: 2017-02-28 — End: 2017-03-03
  Administered 2017-03-01 – 2017-03-03 (×4): via ORAL

## 2017-02-28 MED ORDER — SALINE PERIPHERAL FLUSH PRN
Freq: Once | INTRAMUSCULAR | Status: AC
Start: 2017-02-28 — End: 2017-02-28
  Administered 2017-02-28: 20:00:00

## 2017-02-28 MED ORDER — TRAMADOL 50 MG TAB
50 mg | Freq: Four times a day (QID) | ORAL | Status: DC | PRN
Start: 2017-02-28 — End: 2017-03-03

## 2017-02-28 MED ORDER — GADOBENATE DIMEGLUMINE 529 MG/ML (0.1 MMOL/0.2 ML) IV
529 mg/mL (0.1mmol/0.2mL) | Freq: Once | INTRAVENOUS | Status: AC
Start: 2017-02-28 — End: 2017-02-28
  Administered 2017-02-28: 20:00:00 via INTRAVENOUS

## 2017-02-28 MED ORDER — SIMVASTATIN 20 MG TAB
20 mg | Freq: Every evening | ORAL | Status: DC
Start: 2017-02-28 — End: 2017-03-03
  Administered 2017-03-01 – 2017-03-03 (×3): via ORAL

## 2017-02-28 MED ORDER — FERROUS SULFATE 325 MG (65 MG ELEMENTAL IRON) TAB
325 mg (65 mg iron) | Freq: Every day | ORAL | Status: DC
Start: 2017-02-28 — End: 2017-03-03
  Administered 2017-03-01 – 2017-03-03 (×4): via ORAL

## 2017-02-28 MED ORDER — AMLODIPINE 10 MG TAB
10 mg | Freq: Every evening | ORAL | Status: DC
Start: 2017-02-28 — End: 2017-03-03
  Administered 2017-03-01 – 2017-03-03 (×3): via ORAL

## 2017-02-28 MED ORDER — DABRAFENIB 75 MG CAPSULE
75 mg | Freq: Two times a day (BID) | ORAL | Status: DC
Start: 2017-02-28 — End: 2017-03-03
  Administered 2017-03-01 – 2017-03-03 (×7): via ORAL

## 2017-02-28 MED ORDER — LEVETIRACETAM 500 MG TAB
500 mg | Freq: Two times a day (BID) | ORAL | Status: DC
Start: 2017-02-28 — End: 2017-03-03
  Administered 2017-02-28 – 2017-03-03 (×7): via ORAL

## 2017-02-28 MED ORDER — FLUTICASONE 50 MCG/ACTUATION NASAL SPRAY, SUSP
50 mcg/actuation | Freq: Every evening | NASAL | Status: DC
Start: 2017-02-28 — End: 2017-03-03
  Administered 2017-03-01: 01:00:00 via NASAL

## 2017-02-28 MED ORDER — HYDROCODONE-ACETAMINOPHEN 7.5 MG-325 MG TAB
ORAL | Status: DC | PRN
Start: 2017-02-28 — End: 2017-03-03

## 2017-02-28 MED ORDER — TRAMETINIB 2 MG TABLET
2 mg | Freq: Every day | ORAL | Status: DC
Start: 2017-02-28 — End: 2017-03-03
  Administered 2017-03-01 – 2017-03-03 (×2): via ORAL

## 2017-02-28 MED ORDER — LORATADINE 10 MG TAB
10 mg | Freq: Every day | ORAL | Status: DC | PRN
Start: 2017-02-28 — End: 2017-03-03

## 2017-02-28 MED ORDER — POTASSIUM CHLORIDE 20 MEQ ORAL PACKET FOR SOLUTION
20 mEq | Freq: Two times a day (BID) | ORAL | Status: DC
Start: 2017-02-28 — End: 2017-03-03
  Administered 2017-02-28 – 2017-03-03 (×6): via ORAL

## 2017-02-28 MED ORDER — METOPROLOL TARTRATE 25 MG TAB
25 mg | Freq: Every day | ORAL | Status: DC
Start: 2017-02-28 — End: 2017-03-03
  Administered 2017-03-01 – 2017-03-03 (×4): via ORAL

## 2017-02-28 MED ORDER — ENOXAPARIN 40 MG/0.4 ML SUB-Q SYRINGE
40 mg/0.4 mL | SUBCUTANEOUS | Status: DC
Start: 2017-02-28 — End: 2017-03-03
  Administered 2017-02-28 – 2017-03-02 (×3): via SUBCUTANEOUS

## 2017-02-28 MED ORDER — DEXAMETHASONE SODIUM PHOSPHATE 10 MG/ML IJ SOLN
10 mg/mL | INTRAMUSCULAR | Status: AC
Start: 2017-02-28 — End: 2017-02-28
  Administered 2017-02-28: 15:00:00 via INTRAVENOUS

## 2017-02-28 MED ORDER — DEXAMETHASONE 4 MG TAB
4 mg | Freq: Four times a day (QID) | ORAL | Status: DC
Start: 2017-02-28 — End: 2017-03-03
  Administered 2017-02-28 – 2017-03-03 (×13): via ORAL

## 2017-02-28 MED FILL — FUROSEMIDE 10 MG/ML IJ SOLN: 10 mg/mL | INTRAMUSCULAR | Qty: 4

## 2017-02-28 MED FILL — DEXAMETHASONE SODIUM PHOSPHATE 10 MG/ML IJ SOLN: 10 mg/mL | INTRAMUSCULAR | Qty: 1

## 2017-02-28 MED FILL — LEVETIRACETAM 500 MG TAB: 500 mg | ORAL | Qty: 1

## 2017-02-28 MED FILL — POTASSIUM CHLORIDE 20 MEQ ORAL PACKET FOR SOLUTION: 20 mEq | ORAL | Qty: 1

## 2017-02-28 MED FILL — LOVENOX 40 MG/0.4 ML SUBCUTANEOUS SYRINGE: 40 mg/0.4 mL | SUBCUTANEOUS | Qty: 0.4

## 2017-02-28 MED FILL — DEXAMETHASONE 4 MG TAB: 4 mg | ORAL | Qty: 1

## 2017-02-28 NOTE — Progress Notes (Signed)
Problem: Falls - Risk of  Goal: *Absence of Falls  Document Schmid Fall Risk and appropriate interventions in the flowsheet.   Outcome: Progressing Towards Goal  Fall Risk Interventions:       Mentation Interventions: Adequate sleep, hydration, pain control    Medication Interventions: Bed/chair exit alarm    Elimination Interventions: Bed/chair exit alarm, Call light in reach, Patient to call for help with toileting needs    History of Falls Interventions: Bed/chair exit alarm

## 2017-02-28 NOTE — H&P (Addendum)
Inpatient Hematology / Oncology History and Physical    Reason for Asmission:  metastatic melanoma  Aphasia  Metastatic melanoma of brain [facility])    History of Present Illness:  Katelyn [name] is a 61 y.o. female admitted on 02/28/2017 with a primary diagnosis of Aphasia, confusion and metastatic melanoma.  Per her husband and daughter, she woke this morning with aphasia, confusion and some ride sided weakness.  She has been doing very well up until today.  She has not had any fevers or recent infections.  No GI or bowel complaints.  Good energy and appetite.  She is a known patient of Dr Jenetta Loges and has been on [place] and [place] and has been taking it regularly.  She is also known to Dr Maceo Pro and has had cyberknife to her brain mets.  Husband states 2 weeks ago her decadron was decreased from 4 mg BID to 36m in AM and 2 mg in PM.  He states the last time her steroids were reduced she had an "episode" that he states is stroke like and resolved in 36 hours ([facility] after steroids were increased.  She was taken to the [facility] today and then transferred downtown to be admitted to our service.  A CT of her head was performed and showed edema around the lesion on the left.         Review of Systems: Patient was unable to provide, history provided by family.  Constitutional Denies fever, chills, weight loss, appetite changes, fatigue, night sweats.   HEENT Denies trauma, blurry vision, hearing loss, ear pain, nosebleeds, sore throat, neck pain and ear discharge.    Skin Denies lesions or rashes.   Lungs Denies dyspnea, cough, sputum production or hemoptysis.   Cardiovascular Denies chest pain, palpitations, or lower extremity edema.   Gastrointestinal Denies nausea, vomiting, changes in bowel habits, bloody or black stools, abdominal pain.   GU Denies dysuria, frequency or hesitancy of urination.   Neuro +right sided weakness, aphasia    Denies headaches, visual changes or ataxia. Denies dizziness, tingling, tremors, sensory change or headaches.     Hematology Denies easy bruising or bleeding, denies gingival bleeding or epistaxis.   Endo Denies heat/cold intolerance, denies diabetes or thyroid abnormalities.   MSK Denies back pain, arthralgias, myalgias or frequent falls.     Psychiatric/Behavioral The patient is not nervous/anxious.         Allergies   Allergen Reactions   ??? Adhesive Tape-Silicones Rash     Past Medical History:   Diagnosis Date   ??? [place]     melanoma - back   ??? Hypercholesterolemia    ??? Hypertension     managed with meds   ??? Nausea & vomiting    ??? Osteopenia      Past Surgical History:   Procedure Laterality Date   ??? HX COLONOSCOPY     ??? HX GYN      c-section x 5   ??? HX ORTHOPAEDIC Left 04/30/2016    knee   ??? HX OTHER SURGICAL      melanoma removed from back     Family History   Problem Relation Age of Onset   ??? Hypertension Mother    ??? Elevated Lipids Mother    ??? Elevated Lipids Father    ??? Hypertension Father    ??? Arthritis-osteo Father      spine   ??? Heart Disease Father    ??? Breast Cancer Other    ???  Diabetes Sister      Social History     Social History   ??? Marital status: MARRIED     Spouse name: N/A   ??? Number of children: N/A   ??? Years of education: N/A     Occupational History   ??? Not on file.     Social History Main Topics   ??? Smoking status: Never Smoker   ??? Smokeless tobacco: Never Used   ??? Alcohol use No   ??? Drug use: Not on file   ??? Sexual activity: Not on file     Other Topics Concern   ??? Not on file     Social History Narrative     Current Facility-Administered Medications   Medication Dose Route Frequency Provider Last Rate Last Dose   ??? dexamethasone (DECADRON) tablet 4 mg  4 mg Oral Q6H Charna Elizabeth, NP       ??? [START ON 03/01/2017] pantoprazole (PROTONIX) injection 40 mg  40 mg IntraVENous DAILY Charna Elizabeth, NP       ??? enoxaparin (LOVENOX) injection 40 mg  40 mg SubCUTAneous Q24H Charna Elizabeth, NP           OBJECTIVE:  Patient Vitals for the past 8 hrs:   BP Temp Pulse Resp SpO2 Weight   02/28/17 1258 99/63 97.7 ??F (36.5 ??C) (!) 59 15 90 % 143 lb 1.6 oz (64.9 kg)     Temp (24hrs), Avg:98.2 ??F (36.8 ??C), Min:97.7 ??F (36.5 ??C), Max:98.6 ??F (37 ??C)    03/11 0701 - 03/11 1900  In: -   Out: 1200 [Urine:1200]    Physical Exam:  Constitutional: Well developed, well nourished female in no acute distress, sitting comfortably in the hospital bed.    HEENT: Normocephalic and atraumatic. Oropharynx is clear, mucous membranes are moist. Sclerae anicteric. Neck supple without JVD. No thyromegaly present.    Skin Warm and dry.  No bruising and no rash noted.  No erythema.  No pallor.    Respiratory Lungs are clear to auscultation bilaterally without wheezes, rales or rhonchi, normal air exchange without accessory muscle use.    CVS Normal rate, regular rhythm and normal S1 and S2.  No murmurs, gallops, or rubs.   Abdomen Soft, nontender and nondistended, normoactive bowel sounds.  No palpable mass.  No hepatosplenomegaly.   Neuro Unable to follow all commands.  Aphasic, but alert.    MSK RUE 3/5, RLE 4/5. Normal range of motion in general.  No edema and no tenderness.   Psych Appropriate mood and affect.        Labs:    Recent Results (from the past 24 hour(s))   POC TROPONIN-I    Collection Time: 02/28/17 10:16 AM   Result Value Ref Range    Troponin-I (POC) 0 (L) 0.02 - 0.05 ng/ml   POC LACTIC ACID    Collection Time: 02/28/17 10:19 AM   Result Value Ref Range    Lactic Acid (POC) 1.5 0.5 - 1.9 mmol/L   EKG, 12 LEAD, INITIAL    Collection Time: 02/28/17 10:21 AM   Result Value Ref Range    Ventricular Rate 80 BPM    Atrial Rate 80 BPM    P-R Interval 188 ms    QRS Duration 86 ms    Q-T Interval 388 ms    QTC Calculation (Bezet) 447 ms    Calculated P Axis 19 degrees    Calculated R Axis 58 degrees  Calculated T Axis 50 degrees    Diagnosis       !! AGE AND GENDER SPECIFIC ECG ANALYSIS !!   Sinus rhythm with occasional Premature ventricular complexes  Otherwise normal ECG  When compared with ECG of 18-Nov-2016 17:11,  Premature ventricular complexes are now Present     CBC WITH AUTOMATED DIFF    Collection Time: 02/28/17 10:22 AM   Result Value Ref Range    WBC 5.9 4.3 - 11.1 K/uL    RBC 5.02 4.05 - 5.25 M/uL    HGB 15.0 11.7 - 15.4 g/dL    HCT 44.4 35.8 - 46.3 %    MCV 88.4 79.6 - 97.8 FL    MCH 29.9 26.1 - 32.9 PG    MCHC 33.8 31.4 - 35.0 g/dL    RDW 16.3 (H) 11.9 - 14.6 %    PLATELET 384 150 - 450 K/uL    MPV 8.7 (L) 10.8 - 14.1 FL    DF AUTOMATED      NEUTROPHILS 44 43 - 78 %    LYMPHOCYTES 41 13 - 44 %    MONOCYTES 13 (H) 4.0 - 12.0 %    EOSINOPHILS 1 0.5 - 7.8 %    BASOPHILS 0 0.0 - 2.0 %    IMMATURE GRANULOCYTES 1 0.0 - 5.0 %    ABS. NEUTROPHILS 2.7 1.7 - 8.2 K/UL    ABS. LYMPHOCYTES 2.4 0.5 - 4.6 K/UL    ABS. MONOCYTES 0.8 0.1 - 1.3 K/UL    ABS. EOSINOPHILS 0.0 0.0 - 0.8 K/UL    ABS. BASOPHILS 0.0 0.0 - 0.2 K/UL    ABS. IMM. GRANS. 0.0 0.0 - 0.5 K/UL   METABOLIC PANEL, COMPREHENSIVE    Collection Time: 02/28/17 10:22 AM   Result Value Ref Range    Sodium 137 136 - 145 mmol/L    Potassium 3.0 (L) 3.5 - 5.1 mmol/L    Chloride 96 (L) 98 - 107 mmol/L    CO2 33 (H) 21 - 32 mmol/L    Anion gap 8 7 - 16 mmol/L    Glucose 118 (H) 65 - 100 mg/dL    BUN 18 8 - 23 MG/DL    Creatinine 0.76 0.6 - 1.0 MG/DL    GFR est AA >60 >60 ml/min/1.38m    GFR est non-AA >60 >60 ml/min/1.765m   Calcium 9.1 8.3 - 10.4 MG/DL    Bilirubin, total 0.5 0.2 - 1.1 MG/DL    ALT (SGPT) 202 (H) 12 - 65 U/L    AST (SGOT) 134 (H) 15 - 37 U/L    Alk. phosphatase 174 (H) 50 - 136 U/L    Protein, total 6.9 6.3 - 8.2 g/dL    Albumin 3.1 (L) 3.2 - 4.6 g/dL    Globulin 3.8 (H) 2.3 - 3.5 g/dL    A-G Ratio 0.8 (L) 1.2 - 3.5         Imaging:  Noncontrast head CT   ??  Clinical Indication: Acute confusion and dysarthria with history of melanoma and  hemorrhagic brain metastases.  ??   Technique: Noncontrast axial images were obtained through the brain.  ??  Comparison: MRI 11/20/2016, CT 11/19/2016, PET CT 02/18/2017  ??  Findings: Again demonstrated are multiple intracranial hyperdense and/or  hemorrhagic metastasis system with the patient's known melanoma. This has been  further evaluated on multiple previous imaging studies. The number and extent of  lesions appear very similar to the recent PET/CT. There is a large  amount of  edema surrounding the largest mass which is in the left frontoparietal cranium.  There is 4 mm of rightward midline shift which has increased from the prior MRI  of 11/20/2016. The basilar cisterns are slightly effaced. No definite new  hemorrhage. No hydrocephalus or developing extra-axial fluid.  ??  The bones are unchanged. No evidence of skull fracture. The mastoids and  paranasal sinuses appear aerated.  ??  ??  IMPRESSION  Impression:  1. Multiple intracranial metastases very similar to recent PET/CT.  2. Mass effect, and mild midline shift.  ??    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.109  ICD-9-CM: 345.50  11/19/2016        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[place]) ICD-10-CM: C79.31  ICD-9-CM: 198.3  11/18/2016        Liver lesion ICD-10-CM: K76.9  ICD-9-CM: 573.8  10/16/2016        Osteopenia ICD-10-CM: M85.80  ICD-9-CM: 733.90  08/18/2013        HTN (hypertension) (Chronic) ICD-10-CM: I10  ICD-9-CM: 401.9  08/18/2013        Hyperlipidemia ICD-10-CM: E78.5  ICD-9-CM: 272.4  08/18/2013                PLAN:  Metastatic Melanoma to Brain  - currently on Taf[place]lar and [place] (since 11/2016), Dex 4 mg AM, 2 mg PM  -continue Tafinlar and Mekinist     Aphasia/Confusion/Brain lesion edema  - increase Dex to 40 mg Q6h, MRI brain, consult RadOnc for input    Supportive Care  Continue home meds  Lovenox for DVT Prophylaxis  Follow [place] SOPs        Charna Elizabeth, NP  [facility] [place] [address]  Office : [phone]  Fax : [phone]       Attending Addendum:  I personally evaluated the patient with Charna Elizabeth, N.P.,  and agree with the assessment, findings and plan as documented.  Appears stable, heart regular without murmur, lungs clear, abdomen benign. Case d/w family. Begin higher dose dex, continue keppra. Obtain Brain MRI w/wo contrast. Will get Rad-Onc involved as well. Supportive care as above.               Letha Cape, MD  [facility]  [facility] [place]  [address] [place] [place] [address]  Office : [phone]  Fax : [phone]

## 2017-02-28 NOTE — Other (Signed)
TRANSFER - OUT REPORT:    Verbal report given to Elta Guadeloupe, RN on Katelyn [name]  being transferred to [facility] RM 520 for routine progression of care       Report consisted of patient???s Situation, Background, Assessment and   Recommendations(SBAR).     Information from the following report(s) SBAR, ED Summary, Procedure Summary, Intake/Output, MAR and Recent Results was reviewed with the receiving nurse.    Lines:   Peripheral IV 02/28/17 Left Antecubital (Active)   Site Assessment Clean, dry, & intact 02/28/2017 10:24 AM   Phlebitis Assessment 0 02/28/2017 10:24 AM   Infiltration Assessment 0 02/28/2017 10:24 AM   Dressing Status Clean, dry, & intact 02/28/2017 10:24 AM        Opportunity for questions and clarification was provided.      Patient transported with:   O2 @ 2 liters  Transported to [facility] facility via stretcher with [facility] ambulance service.

## 2017-02-28 NOTE — ED Notes (Signed)
Pt to room 520 at [facility] per Ivan.

## 2017-02-28 NOTE — Progress Notes (Signed)
END OF SHIFT NOTE:    Intake/Output      Voiding: YES  Catheter: {YES/NO:18482:yes  Drain:              Stool:  0 occurrences.         Emesis:  0 occurrences.          VITAL SIGNS  Patient Vitals for the past 12 hrs:   Temp Pulse Resp BP SpO2   02/28/17 1429 97.5 ??F (36.4 ??C) 81 16 130/86 96 %   02/28/17 1258 97.7 ??F (36.5 ??C) (!) 59 15 99/63 90 %       Pain Assessment  Pain 1  Pain Scale 1: Adult Nonverbal Pain Scale (02/28/17 1301)  Pain Intensity 1: 0 (02/28/17 1301)  Patient Stated Pain Goal: 0 (02/28/17 1301)    Ambulating  No    Additional Information:     Shift report given to oncoming nurse at the bedside.    Alyson Locket Melven Sartorius

## 2017-02-28 NOTE — ED Notes (Signed)
Bed request faxed [facility].

## 2017-02-28 NOTE — Progress Notes (Signed)
Verbal report received from courtney at [facility] ER re: SBAR for pt.

## 2017-02-28 NOTE — Progress Notes (Signed)
Pt received to room 520 via stretcher as ambulance transfer from [facility] ED. Dual skin assessment completed with Nils Flack, RN. Spouse at bedside. VSS on 2 L NC. Foley emptied for 1200cc clear, yellow urine. Manual Meier, NP informed of pt's arrival; awaiting orders.

## 2017-02-28 NOTE — ED Provider Notes (Addendum)
HPI Comments: Patient was brought to return today due to altered mental status.  The patient has a history of metastatic melanoma with brain metastases that have had the hemorrhages in the past.  Husband states that she was doing well last evening until bedtime this morning when she woke up he knew that "there was something wrong".  She is gradually gotten progressively worsening aphasia and although she came down stairs without difficulty this morning on the way out to the car she began dragging her right lower extremity and now appears to have some right-sided facility.  Patient is completely aphasic and while awake and alert and is not following commands and unable to give any history or review of systems at this time.  She is currently receiving oral chemotherapy agents daily as well as Decadron 4 mg twice a day.  No seizure activity has been noted and there is been no fever vomiting or diarrhea    Patient is a 61 y.o. female presenting with altered mental status. The history is provided by the patient.   Altered mental status    This is a new problem. The current episode started 1 to 2 hours ago. The problem has been rapidly worsening. Associated symptoms include weakness. Pertinent negatives include no confusion, no somnolence, no seizures, no unresponsiveness, no agitation, no delusions, no hallucinations, no self-injury, no violence, no tingling and no numbness. Mental status baseline is normal.  Risk factors: metastatic melenoma. Her past medical history is significant for CVA. Her past medical history does not include diabetes, seizures, liver disease, TIA, AIDS, hypertension, COPD, depression, dementia, psychotropic medication treatment, head trauma or heart disease.        Past Medical History:   Diagnosis Date   ??? Cancer ([place])     melanoma - back   ??? Hypercholesterolemia    ??? Hypertension     managed with meds   ??? Nausea & vomiting    ??? Osteopenia        Past Surgical History:    Procedure Laterality Date   ??? HX COLONOSCOPY     ??? HX GYN      c-section x 5   ??? HX ORTHOPAEDIC Left 04/30/2016    knee   ??? HX OTHER SURGICAL      melanoma removed from back         Family History:   Problem Relation Age of Onset   ??? Hypertension Mother    ??? Elevated Lipids Mother    ??? Elevated Lipids Father    ??? Hypertension Father    ??? Arthritis-osteo Father      spine   ??? Heart Disease Father    ??? Breast Cancer Other    ??? Diabetes Sister        Social History     Social History   ??? Marital status: MARRIED     Spouse name: N/A   ??? Number of children: N/A   ??? Years of education: N/A     Occupational History   ??? Not on file.     Social History Main Topics   ??? Smoking status: Never Smoker   ??? Smokeless tobacco: Never Used   ??? Alcohol use No   ??? Drug use: Not on file   ??? Sexual activity: Not on file     Other Topics Concern   ??? Not on file     Social History Narrative         ALLERGIES: Adhesive tape-silicones  Review of Systems   Reason unable to perform ROS: ROS per Husband.   Constitutional: Negative for chills and fever.   HENT: Negative for drooling and rhinorrhea.    Neurological: Positive for weakness. Negative for tingling, seizures and numbness.   Psychiatric/Behavioral: Negative for agitation, confusion, hallucinations and self-injury.   All other systems reviewed and are negative.      Vitals:    02/28/17 1018   BP: (!) 158/97   Pulse: 72   Resp: 16   Temp: 98.6 ??F (37 ??C)   SpO2: (!) 88%   Weight: 68 kg (150 lb)   Height: 5\' 7"  (1.702 m)            Physical Exam   Constitutional: She appears well-developed and well-nourished. No distress.   HENT:   Head: Normocephalic and atraumatic.   Mouth/Throat: Oropharynx is clear and moist.   Alopecia noted, reported secondary to radiation therapy   Eyes: Conjunctivae and EOM are normal. Pupils are equal, round, and reactive to light.   Neck: Normal range of motion. Neck supple.   Cardiovascular: Normal rate and regular rhythm.     Pulmonary/Chest: Effort normal and breath sounds normal.   Abdominal: Soft. Bowel sounds are normal.   Musculoskeletal: Normal range of motion. She exhibits no edema, tenderness or deformity.   Lymphadenopathy:     She has no cervical adenopathy.   Neurological: She is alert. She displays abnormal reflex. No cranial nerve deficit. She exhibits abnormal muscle tone. Coordination abnormal.   Patient is a phasic, not following commands, right sided hemi-plegia noted.  She is moving her left upper extremity but also appears to be weak in the left lower extremity   Skin: Skin is warm and dry.   Psychiatric: She has a normal mood and affect. Her behavior is normal.   Nursing note and vitals reviewed.       MDM  Number of Diagnoses or Management Options     Amount and/or Complexity of Data Reviewed  Clinical lab tests: ordered and reviewed  Tests in the radiology section of CPT??: ordered and reviewed  Independent visualization of images, tracings, or specimens: yes    Risk of Complications, Morbidity, and/or Mortality  Presenting problems: high  Diagnostic procedures: high  Management options: moderate    Patient Progress  Patient progress: stable        ED Course       Procedures    CT of the head did demonstrate significant edema the left-sided metastases.  No new hemorrhages are noted.  Case discussed with Dr. Faith Rogue, patient be [place]ology [place]

## 2017-02-28 NOTE — ED Triage Notes (Signed)
Husband states pt woke up around 0800 this morning and was confused and not talking normally. Pt is having paralysis on right side arm and leg for about 30 mins. Pt has had two head bleeds in the past five months.

## 2017-02-28 NOTE — ED Notes (Signed)
IV site stayed in place for transfer to receiving facility.

## 2017-03-01 ENCOUNTER — Inpatient Hospital Stay: Admit: 2017-03-01 | Payer: PRIVATE HEALTH INSURANCE | Attending: Internal Medicine | Primary: Family Medicine

## 2017-03-01 DIAGNOSIS — C7931 Secondary malignant neoplasm of brain: Secondary | ICD-10-CM

## 2017-03-01 LAB — METABOLIC PANEL, COMPREHENSIVE
A-G Ratio: 0.8 — ABNORMAL LOW (ref 1.2–3.5)
ALT (SGPT): 125 U/L — ABNORMAL HIGH (ref 12–65)
AST (SGOT): 60 U/L — ABNORMAL HIGH (ref 15–37)
Albumin: 3 g/dL — ABNORMAL LOW (ref 3.2–4.6)
Alk. phosphatase: 160 U/L — ABNORMAL HIGH (ref 50–136)
Anion gap: 11 mmol/L (ref 7–16)
BUN: 21 MG/DL (ref 8–23)
Bilirubin, total: 0.6 MG/DL (ref 0.2–1.1)
CO2: 29 mmol/L (ref 21–32)
Calcium: 8.8 MG/DL (ref 8.3–10.4)
Chloride: 92 mmol/L — ABNORMAL LOW (ref 98–107)
Creatinine: 0.84 MG/DL (ref 0.6–1.0)
GFR est AA: >60 mL/min/{1.73_m2} (ref >60)
GFR est non-AA: >60 mL/min/{1.73_m2} (ref >60)
Globulin: 3.7 g/dL — ABNORMAL HIGH (ref 2.3–3.5)
Glucose: 237 mg/dL — ABNORMAL HIGH (ref 65–100)
Potassium: 4.2 mmol/L (ref 3.5–5.1)
Protein, total: 6.7 g/dL (ref 6.3–8.2)
Sodium: 132 mmol/L — ABNORMAL LOW (ref 136–145)

## 2017-03-01 LAB — CBC WITH AUTOMATED DIFF
ABS. BASOPHILS: 0 10*3/uL (ref 0.0–0.2)
ABS. EOSINOPHILS: 0 10*3/uL (ref 0.0–0.8)
ABS. IMM. GRANS.: 0 10*3/uL (ref 0.0–0.5)
ABS. LYMPHOCYTES: 1.5 10*3/uL (ref 0.5–4.6)
ABS. MONOCYTES: 0.4 10*3/uL (ref 0.1–1.3)
ABS. NEUTROPHILS: 5 10*3/uL (ref 1.7–8.2)
BASOPHILS: 0 % (ref 0.0–2.0)
EOSINOPHILS: 0 % — ABNORMAL LOW (ref 0.5–7.8)
HCT: 44.5 % (ref 35.8–46.3)
HGB: 15.2 g/dL (ref 11.7–15.4)
IMMATURE GRANULOCYTES: 0 % (ref 0.0–5.0)
LYMPHOCYTES: 22 % (ref 13–44)
MCH: 30.8 PG (ref 26.1–32.9)
MCHC: 34.2 g/dL (ref 31.4–35.0)
MCV: 90.3 FL (ref 79.6–97.8)
MONOCYTES: 5 % (ref 4.0–12.0)
MPV: 8.9 FL — ABNORMAL LOW (ref 10.8–14.1)
NEUTROPHILS: 73 % (ref 43–78)
PLATELET: 352 10*3/uL (ref 150–450)
RBC: 4.93 M/uL (ref 4.05–5.25)
RDW: 16.2 % — ABNORMAL HIGH (ref 11.9–14.6)
WBC: 6.9 10*3/uL (ref 4.3–11.1)

## 2017-03-01 LAB — MAGNESIUM: Magnesium: 2 mg/dL (ref 1.8–2.4)

## 2017-03-01 MED ORDER — SODIUM CHLORIDE 0.9 % IV
2 mEq/mL | Freq: Once | INTRAVENOUS | Status: AC
Start: 2017-03-01 — End: 2017-03-01
  Administered 2017-03-01: 13:00:00 via INTRAVENOUS

## 2017-03-01 MED ORDER — POTASSIUM CHLORIDE 40 MEQ/100 ML IV PIGGY BACK
40 mEq/100 mL | Freq: Once | INTRAVENOUS | Status: DC
Start: 2017-03-01 — End: 2017-03-01

## 2017-03-01 MED FILL — FLUTICASONE 50 MCG/ACTUATION NASAL SPRAY, SUSP: 50 mcg/actuation | NASAL | Qty: 16

## 2017-03-01 MED FILL — AMLODIPINE 10 MG TAB: 10 mg | ORAL | Qty: 1

## 2017-03-01 MED FILL — DEXAMETHASONE 4 MG TAB: 4 mg | ORAL | Qty: 1

## 2017-03-01 MED FILL — POTASSIUM CHLORIDE 2 MEQ/ML IV SOLN: 2 mEq/mL | INTRAVENOUS | Qty: 20

## 2017-03-01 MED FILL — LOSARTAN 50 MG TAB: 50 mg | ORAL | Qty: 2

## 2017-03-01 MED FILL — LEVETIRACETAM 500 MG TAB: 500 mg | ORAL | Qty: 1

## 2017-03-01 MED FILL — FERROUS SULFATE 325 MG (65 MG ELEMENTAL IRON) TAB: 325 mg (65 mg iron) | ORAL | Qty: 1

## 2017-03-01 MED FILL — PANTOPRAZOLE 40 MG IV SOLR: 40 mg | INTRAVENOUS | Qty: 40

## 2017-03-01 MED FILL — METOPROLOL TARTRATE 25 MG TAB: 25 mg | ORAL | Qty: 1

## 2017-03-01 MED FILL — POTASSIUM CHLORIDE 20 MEQ ORAL PACKET FOR SOLUTION: 20 mEq | ORAL | Qty: 1

## 2017-03-01 MED FILL — LOVENOX 40 MG/0.4 ML SUBCUTANEOUS SYRINGE: 40 mg/0.4 mL | SUBCUTANEOUS | Qty: 0.4

## 2017-03-01 MED FILL — SIMVASTATIN 20 MG TAB: 20 mg | ORAL | Qty: 1

## 2017-03-01 NOTE — Consults (Signed)
Patient: Katelyn [name] MRN: [id number]  SSN: [id number]    Date of Birth: [birth year]  Age: 61 y.o.  Sex: female      Other Providers:  Smith Robert MD, Mellody Dance MD (primary radiation oncologist)    CHIEF COMPLAINT: Cognitive decline    DIAGNOSIS: Metastatic melanoma with brain metastases, progression vs treatment response with new symptoms    PREVIOUS TREATMENT:  1) 11/19/16 through 11/25/16:  CyberKnife SRS delivering 3000 cGy in 5 fractions of 600 cGy each to 2 large brain lesions and one adjacent small metastasis. She received 2000-2400 cGy to each of 3 smaller lesions using single fraction SRS. The left retrobulbar bulbar metastasis received 2500 cGy in 5 fractions of 500 cGy each.   2) 12/01/16:  [facility] [place] and Mekinist    HISTORY OF PRESENT ILLNESS:  Katelyn [name] is a 61 y.o. female who I am seeing at the request of in-patient oncology.  She has a history of osteopenia, hypertension, hypercholesterolemia, and melanoma of the back s/p excision in 2008. She initially presented to her PCP on 10/14/16 reporting a one month history of intermittent upper abdominal pain with loss of appetite and right scapular pain radiating to her right arm. Ultrasound of the abdomen, obtained on 10/15/16, identified multiple lesions in the liver, concerning for metastatic disease, as well as small hypoechoic areas near the tail of the pancreas, possibly adenopathy. Further imaging was performed with CT scan of the chest, abdomen, pelvis on 10/15/16 showing hepatic and pulmonary lesions highly concerning for metastatic disease. Lung nodules measured up to 12 mm and liver lesions measured up to 25 mm. She was referred to Dr. Jenetta Loges for urgent oncologic consultation for evaluation and management options. He recommended biopsy of a liver lesion and PET/CT for staging. PET/CT scan on 10/29/16 showed small subcutaneous lesions in the left posterior back and right anterior abdominal wall suspicious for recurrent melanoma. Abnormal FDG  activity was also seen to be associated with hepatic and pulmonary lesions. Abnormal activity was seen in mediastinal lymph nodes. In addition, hyperdense abnormal masses were seen in the brain which demonstrated mild activity raising concern for intracranial metastases. MRI of the brain on 10/30/16 showed multiple metastatic masses throughout the bilateral cerebral hemispheres and in the right cerebellum associated with heterogeneous hyperenhancement, measuring up to 1.7 x 2.0 centimeters in the right cerebellar hemisphere and up to 2.7 x 2.7 cm in the left parietal lobe. These were felt likely to be hemorrhagic and were associated with mild surrounding vasogenic edema without significant midline shift. In addition, a heterogeneously enhancing mass was seen in the left retro-orbital fat displacing the optic nerve measuring 1.7 x 2.0 cm consistent with intraorbital retro-bulbar metastasis. The MRI was reviewed by neurosurgeon Dr. Raul Del who recommended against neurosurgical intervention.  She was seen by one of my parteners, Dr. Mellody Dance, who thought she would be appropriate for fractionated stereotactic treatment which she completed November 25, 2016.    During her initial treatment, she began having seizure-like activity in the left lower extremity. Over the next several hours this progressed. She was admitted to [facility] and placed in the ICU. She was initiated on Keppra and had good seizure control but continued to have some left lower extremity weakness and numbness.  She was seen at [facility] on 11/27/16. Recommendation was same as Dr. Jenetta Loges - begin Taflinar and Mekinist. This was initiated under the direction of Dr. Jenetta Loges on 12/01/16.      On 01/06/17 she developed an  acute onset aphasia and confusion. She was seen at the ER at [facility]. CT of the head demonstrated multiple masses with surrounding edema and possibly areas of hemorrhage. She was given 10 mg of IV Decadron and transferred to  [facility]. She was placed on IV Decadron 4 mg every 6 hours with significant improvement in her symptoms. At the time of discharge on 01/09/17 she was able to communicate smoothly and respond appropriately.  MRI of the brain on 01/07/17 during her hospitalization showed multiple intracranial lesions several of which contain components of T1 hyperintensity which could represent hemorrhage and or melanin. The largest of these was in the left temporoparietal region. The appearance was most suspicious for melanoma metastasis. Unfortunately, this MRI was not compared to her prior MRI at [facility].  She was seen by her primary radiation oncologist 01/14/2017 who reviewed the findings and considering she had improved on dexamethasone, made no other changes.    Unfortunately she acutely developed similar symptoms and again presented to the [facility] emergency room and had repeat imaging including an MRI which again showed mixed findings.  As a result of the MRI and her acute issues, we are asked to consult her for possible management which was the rationale for my consultation.     PAST MEDICAL HISTORY:    Past Medical History:   Diagnosis Date   ??? Cancer ([place])     melanoma - back   ??? Hypercholesterolemia    ??? Hypertension     managed with meds   ??? Nausea & vomiting    ??? Osteopenia      PAST SURGICAL HISTORY:   Past Surgical History:   Procedure Laterality Date   ??? HX COLONOSCOPY     ??? HX GYN      c-section x 5   ??? HX ORTHOPAEDIC Left 04/30/2016    knee   ??? HX OTHER SURGICAL      melanoma removed from back       MEDICATIONS:   No current facility-administered medications for this encounter.   No current outpatient prescriptions on file.    Facility-Administered Medications Ordered in Other Encounters:   ???  amLODIPine (NORVASC) tablet 10 mg, 10 mg, Oral, QHS, Charna Elizabeth, NP, 10 mg at 02/28/17 2124  ???  loratadine (CLARITIN) tablet 10 mg, 10 mg, Oral, DAILY PRN, Charna Elizabeth,  NP  ???  dabrafenib cap 150 mg (Patient Supplied), 150 mg, Oral, BID, Charna Elizabeth, NP, 150 mg at 03/01/17 0900  ???  ferrous sulfate tablet 325 mg, 1 Tab, Oral, ACB, Charna Elizabeth, NP, 325 mg at 03/01/17 0805  ???  fluticasone (FLONASE) 50 mcg/actuation nasal spray 2 Spray, 2 Spray, Both Nostrils, QHS, Charna Elizabeth, NP, 2 Spray at 02/28/17 2124  ???  HYDROcodone-acetaminophen (NORCO) 7.5-325 mg per tablet 1 Tab, 1 Tab, Oral, Q4H PRN, Charna Elizabeth, NP  ???  levETIRAcetam (KEPPRA) tablet 500 mg, 500 mg, Oral, BID, Charna Elizabeth, NP, 500 mg at 03/01/17 0805  ???  LORazepam (ATIVAN) tablet 1 mg, 1 mg, Oral, QHS PRN, Charna Elizabeth, NP  ???  losartan (COZAAR) tablet 100 mg, 100 mg, Oral, DAILY, Charna Elizabeth, NP, 100 mg at 03/01/17 0802  ???  metoprolol tartrate (LOPRESSOR) tablet 12.5 mg, 12.5 mg, Oral, DAILY, Charna Elizabeth, NP, 12.5 mg at 03/01/17 3016  ???  simvastatin (ZOCOR) tablet 20 mg, 20 mg, Oral, QHS, Charna Elizabeth, NP,  20 mg at 02/28/17 2124  ???  traMADol (ULTRAM) tablet 50 mg, 50 mg, Oral, Q6H PRN, Charna Elizabeth, NP  ???  trametinib tab 2 mg (Patient Supplied), 2 mg, Oral, DAILY, Charna Elizabeth, NP, 2 mg at 03/01/17 0900  ???  dexamethasone (DECADRON) tablet 4 mg, 4 mg, Oral, Q6H, Charna Elizabeth, NP, 4 mg at 03/01/17 1205  ???  pantoprazole (PROTONIX) injection 40 mg, 40 mg, IntraVENous, DAILY, Charna Elizabeth, NP, 40 mg at 03/01/17 7824  ???  enoxaparin (LOVENOX) injection 40 mg, 40 mg, SubCUTAneous, Q24H, Charna Elizabeth, NP, 40 mg at 02/28/17 1500  ???  potassium chloride (KLOR-CON) packet 20 mEq, 20 mEq, Oral, BID WITH MEALS, Letha Cape, MD, 20 mEq at 03/01/17 0805    ALLERGIES:   Allergies   Allergen Reactions   ??? Adhesive Tape-Silicones Rash       SOCIAL HISTORY:   Social History     Social History   ??? Marital status: MARRIED     Spouse name: N/A   ??? Number of children: N/A   ??? Years of education: N/A     Occupational History   ??? Not on file.     Social History Main Topics   ??? Smoking status: Never Smoker    ??? Smokeless tobacco: Never Used   ??? Alcohol use No   ??? Drug use: Not on file   ??? Sexual activity: Not on file     Other Topics Concern   ??? Not on file     Social History Narrative       FAMILY HISTORY:   Family History   Problem Relation Age of Onset   ??? Hypertension Mother    ??? Elevated Lipids Mother    ??? Elevated Lipids Father    ??? Hypertension Father    ??? Arthritis-osteo Father      spine   ??? Heart Disease Father    ??? Breast Cancer Other    ??? Diabetes Sister        REVIEW OF SYSTEMS: Review of systems was completed through discussion with family including a 12 point review, yet the patient was not able to fully converse.     PHYSICAL EXAMINATION:   ECOG Performance status 3  VITAL SIGNS:   Visit Vitals   ??? BP 135/88 (BP 1 Location: Left arm, BP Patient Position: Supine)   ??? Pulse 91   ??? Temp 98.4 ??F (36.9 ??C)   ??? SpO2 95%        GENERAL: The patient is well-developed, and note acute distress, but not fully able to communicate nonsensical fashion. HEENT: Head is normocephalic, atraumatic. Pupils are equal, round and reactive to light and accommodation. CARDIOVASCULAR: Heart is regular rate and rhythm. There are no murmurs rubs or gallups. Radial pulses are 2+ RESPIRATORY: Lungs are clear to auscultation and percussion. There is normal respiratory effort. GASTROINTESTINAL: The abdomen is soft, non-tender, nondistended with no hepatospelnomagaly. Digital rectal examination: deferred  NEURO:  Cranial nerves II-XII grossly intact.  Muscular strength and sensation are intact throughout all four extremities but she does not fully follow commands.        PATHOLOGY:    See HPI    LABORATORY:   Lab Results   Component Value Date/Time    Sodium 137 02/28/2017 10:22 AM    Potassium 3.0 (L) 02/28/2017 10:22 AM    Chloride 96 (L) 02/28/2017 10:22 AM    CO2 33 (H) 02/28/2017 10:22 AM  Anion gap 8 02/28/2017 10:22 AM    Glucose 118 (H) 02/28/2017 10:22 AM    BUN 18 02/28/2017 10:22 AM    Creatinine 0.76 02/28/2017 10:22 AM     GFR est AA >60 02/28/2017 10:22 AM    GFR est non-AA >60 02/28/2017 10:22 AM    Calcium 9.1 02/28/2017 10:22 AM    Magnesium 2.2 02/24/2017 02:50 PM    Albumin 3.1 (L) 02/28/2017 10:22 AM    Protein, total 6.9 02/28/2017 10:22 AM    Globulin 3.8 (H) 02/28/2017 10:22 AM    A-G Ratio 0.8 (L) 02/28/2017 10:22 AM    AST (SGOT) 134 (H) 02/28/2017 10:22 AM    ALT (SGPT) 202 (H) 02/28/2017 10:22 AM     Lab Results   Component Value Date/Time    WBC 5.9 02/28/2017 10:22 AM    HGB 15.0 02/28/2017 10:22 AM    HCT 44.4 02/28/2017 10:22 AM    PLATELET 384 02/28/2017 10:22 AM       RADIOLOGY:    Mri Brain W Wo Cont    Result Date: 02/28/2017  MRI of the Brain with contrast: 02/28/2017 History: Melanoma, right-sided weakness Sequences: Axial pre and post contrast T1, FLAIR, T2, diffusion, coronal post contrast T1 and sagittal precontrast T1-weighted images of the brain. Comparison: MRI the brain 11/20/2016 13 cc of IV MultiHance   was injected to aid in the detection of intracranial pathology. Findings: There are no restricted diffusion abnormalities. The ventricles and basilar vascular flow voids are unremarkable. The pituitary and sinuses are unremarkable. There are multiple enhancing infratentorial and supratentorial lesions containing hemosiderin/melanin and restricted diffusion. The lesion in the right cerebellar hemisphere currently measures 20 mm x 27 mm as compared to the 32 mm x 25 mm on the last examination. A lesion in the left periatrial white matter formerly measured 25 mm x 32 mm and currently measures 34 mm x 37 mm. There is interval increase in degree of peritumoral edema and mass effect upon the atrium of the left lateral ventricle. A lesion in right parietal white matter which formerly measured 13.5 mm measures 19 mm. A lesion in the superior right occipital white matter which formerly measured 16 mm now measures 21 mm. There are no new lesions. The 7th and 8th nerves and there orbits are unremarkable. There are  scattered focal and confluent areas of T2 prolongation within the subcortical, periventricular, and pontine white matter.     IMPRESSION: Stable pontine and supratentorial microischemia, interval increase and decrease in some of the intracranial lesions as above with the most pronounced change being the increase in size and mass effect of the left periatrial tumor.    Ct Head Wo Cont    Result Date: 02/28/2017  Noncontrast head CT Clinical Indication: Acute confusion and dysarthria with history of melanoma and hemorrhagic brain metastases. Technique: Noncontrast axial images were obtained through the brain. Comparison: MRI 11/20/2016, CT 11/19/2016, PET CT 02/18/2017 Findings: Again demonstrated are multiple intracranial hyperdense and/or hemorrhagic metastasis system with the patient's known melanoma. This has been further evaluated on multiple previous imaging studies. The number and extent of lesions appear very similar to the recent PET/CT. There is a large amount of edema surrounding the largest mass which is in the left frontoparietal cranium. There is 4 mm of rightward midline shift which has increased from the prior MRI of 11/20/2016. The basilar cisterns are slightly effaced. No definite new hemorrhage. No hydrocephalus or developing extra-axial fluid. The bones are unchanged. No  evidence of skull fracture. The mastoids and paranasal sinuses appear aerated.     Impression: 1. Multiple intracranial metastases very similar to recent PET/CT. 2. Mass effect, and mild midline shift.     Pet/ct Tumor Image Wh Bdy W (sub)    Result Date: 02/18/2017  NUCLEAR MEDICINE WHOLE BODY CT / PET- FDG Study. INDICATION: Melanoma, metastatic to brain. COMPARISONS: Noe November 2017. TECHNIQUE:   Radiopharmaceutical: 17.3 mCi F18-FDG IV.  This is a whole-body PET- FDG study performed on a dedicated full- ring scanner. Low dose, nondiagnostic CT axial source images are also acquired for PET attenuation correction and are utilized for  PET-CT fusion.  The patient underwent adequate fasting of at least 4 hours. Blood glucose at the time of tracer administration is 124 mg/dl, which is adequate for imaging. Approximately 90 minutes after administration of the radiopharmaceutical, imaging was initiated covering the skull to the thighs. Additional high-resolution thin section images of the lower extremities were performed. FINDINGS:  HEAD AND NECK: No abnormal hypermetabolic activity. Normal physiologic activity in the brain and salivary glands. Hyperattenuating lesions in the brain have enlarged. No mass effect or herniation. CHEST: Atelectasis right lower lobe. No hypermetabolic pulmonary nodules or adenopathy. Previously described hypermetabolic activity has resolved. ABDOMEN: Hypermetabolic activity of the liver lesions has resolved. Low-attenuation liver lesions are identified along the CT scan. No new abnormal areas of hypermetabolic activity. There is normal physiologic activity GI tract and GU tract. PELVIS: Normal physiologic activity in the urinary bladder. No hypermetabolic adenopathy or bony lesions. LOWER EXTREMITIES: No abnormal hypermetabolic activity in the lower extremities.     IMPRESSION: 1) Hyperattenuating brain lesions have enlarged since prior exam. No mass effect or herniation. 2) Resolution of the hypermetabolic activity of the liver lesions and an of the lesions in the chest. No abnormal hypermetabolic activity appreciated on today's exam.       IMPRESSION:  Katelyn [name] is a 61 y.o. female with metastatic melanoma to the brain.  Unfortunately she had multiple large lesions present at diagnosis but was able to completed treatment with fractionated stereotactic radiation.  Reviewing the MRI, while appears nearly all the lesions have somewhat increased in size, considering the response rate normally is between 80-90%, there is simply statistically a very small chance these are all progressing.  It is far more likely each are  related to treatment related response and edema.  This is particularly true with the other imaging characteristics including loss of peripheral enhancement with many of the lesions having more necrotic centers.  For that reason, after speaking with her primary radiation oncologist and running the case again by neurosurgery, Dr. Raul Del, we did feel these were all treatment related.  We agreed with continued steroid..  I had a lengthy conversation with her husband and daughter about prognosis and outlining the anticipated course from here, which is also quite unpredictable.  I was so full her acute symptoms would improve on the steroid and that she would have a favorable long-term systemic and intracranial response but that it is too early to make accurate predictions.  I did outline the possibility that neurosurgical intervention with either a laser ablative therapy, or craniotomy might be necessary if her local CNS symptoms do not improve that this would come a substantial morbidity.    In the end we agreed repeat MRI in about 4 weeks would be worthwhile, and that she should check in with her primary radiation oncologist next Tuesday, Dr. Maceo Pro, and we have arranged  this.  We also will initiate occupational physical therapy so that she and her family get appropriate and accurate advice on how to test function at home without injury.      PLAN:    1) Discussed treatment with external beam radiation reviewing risk, benefits, and side effects from treatment.   2) Reviewed available research treatment and cancer care protocols for which patient may be eligible.  Unfortunately there are no matching clinical trials available at this time.    3) Coordinate care with medical oncology and neurosurgery.  4) Assumed treatment-related effect and will continue to monitor with repeat MRI and follow-up.      Portions of this note were copied from prior encounters and reviewed for accuracy, currency, and represent documentation and  tasks completed during this encounter.  I verify and attest these portions to be unchanged from prior visits.    Daryll Drown, MD  03/01/17

## 2017-03-01 NOTE — Progress Notes (Signed)
Problem: Falls - Risk of  Goal: *Absence of Falls  Document Schmid Fall Risk and appropriate interventions in the flowsheet.   Outcome: Progressing Towards Goal  Fall Risk Interventions:  Mobility Interventions: Patient to call before getting OOB, Bed/chair exit alarm, Communicate number of staff needed for ambulation/transfer    Mentation Interventions: Bed/chair exit alarm    Medication Interventions: Patient to call before getting OOB, Bed/chair exit alarm, Teach patient to arise slowly, Evaluate medications/consider consulting pharmacy    Elimination Interventions: Call light in reach, Bed/chair exit alarm, Patient to call for help with toileting needs    History of Falls Interventions: Door open when patient unattended

## 2017-03-01 NOTE — Progress Notes (Signed)
END OF SHIFT NOTE:    Intake/Output      Voiding: YES  Catheter: YES  Drain:              Stool:  0 occurrences.         Emesis:  0 occurrences.          VITAL SIGNS  Patient Vitals for the past 12 hrs:   Temp Pulse Resp BP SpO2   03/01/17 0350 98.8 ??F (37.1 ??C) 79 18 136/84 100 %   02/28/17 2351 98.1 ??F (36.7 ??C) 85 18 (!) 148/99 95 %       Pain Assessment  Pain 1  Pain Scale 1: Visual (03/01/17 0222)  Pain Intensity 1: 0 (03/01/17 0222)  Patient Stated Pain Goal: 0 (03/01/17 0222)    Ambulating  No    Shift report given to oncoming nurse at the bedside.    Duard Larsen

## 2017-03-01 NOTE — Consults (Signed)
Patient: Katelyn [name] MRN: [id number]  SSN: [id number]    Date of Birth: [birth year]  Age: 61 y.o.  Sex: female      Other Providers:  Smith Robert MD, Mellody Dance MD (primary radiation oncologist)    CHIEF COMPLAINT: Cognitive decline    DIAGNOSIS: Metastatic melanoma with brain metastases, progression vs treatment response with new symptoms    PREVIOUS TREATMENT:  1) 11/19/16 through 11/25/16:  CyberKnife SRS delivering 3000 cGy in 5 fractions of 600 cGy each to 2 large brain lesions and one adjacent small metastasis. She received 2000-2400 cGy to each of 3 smaller lesions using single fraction SRS. The left retrobulbar bulbar metastasis received 2500 cGy in 5 fractions of 500 cGy each.   2) 12/01/16:  Rosezella [place] and Mekinist    HISTORY OF PRESENT ILLNESS:  Katelyn [name] is a 61 y.o. female who I am seeing at the request of in-patient oncology.  She has a history of osteopenia, hypertension, hypercholesterolemia, and melanoma of the back s/p excision in 2008. She initially presented to her PCP on 10/14/16 reporting a one month history of intermittent upper abdominal pain with loss of appetite and right scapular pain radiating to her right arm. Ultrasound of the abdomen, obtained on 10/15/16, identified multiple lesions in the liver, concerning for metastatic disease, as well as small hypoechoic areas near the tail of the pancreas, possibly adenopathy. Further imaging was performed with CT scan of the chest, abdomen, pelvis on 10/15/16 showing hepatic and pulmonary lesions highly concerning for metastatic disease. Lung nodules measured up to 12 mm and liver lesions measured up to 25 mm. She was referred to Dr. Jenetta Loges for urgent oncologic consultation for evaluation and management options. He recommended biopsy of a liver lesion and PET/CT for staging. PET/CT scan on 10/29/16 showed small subcutaneous lesions in the left posterior back and right anterior  abdominal wall suspicious for recurrent melanoma. Abnormal FDG activity was also seen to be associated with hepatic and pulmonary lesions. Abnormal activity was seen in mediastinal lymph nodes. In addition, hyperdense abnormal masses were seen in the brain which demonstrated mild activity raising concern for intracranial metastases. MRI of the brain on 10/30/16 showed multiple metastatic masses throughout the bilateral cerebral hemispheres and in the right cerebellum associated with heterogeneous hyperenhancement, measuring up to 1.7 x 2.0 centimeters in the right cerebellar hemisphere and up to 2.7 x 2.7 cm in the left parietal lobe. These were felt likely to be hemorrhagic and were associated with mild surrounding vasogenic edema without significant midline shift. In addition, a heterogeneously enhancing mass was seen in the left retro-orbital fat displacing the optic nerve measuring 1.7 x 2.0 cm consistent with intraorbital retro-bulbar metastasis. The MRI was reviewed by neurosurgeon Dr. Raul Del who recommended against neurosurgical intervention.  She was seen by one of my parteners, Dr. Mellody Dance, who thought she would be appropriate for fractionated stereotactic treatment which she completed November 25, 2016.    During her initial treatment, she began having seizure-like activity in the left lower extremity. Over the next several hours this progressed. She was admitted to [facility] and placed in the ICU. She was initiated on Keppra and had good seizure control but continued to have some left lower extremity weakness and numbness.  She was seen at [facility] on 11/27/16. Recommendation was same as Dr. Jenetta Loges - begin Taflinar and Mekinist. This was initiated under the direction of Dr. Jenetta Loges on 12/01/16.      On 01/06/17 she developed an  acute onset aphasia and confusion. She was seen at the ER at [facility]. CT of the head demonstrated  multiple masses with surrounding edema and possibly areas of hemorrhage. She was given 10 mg of IV Decadron and transferred to [facility]. She was placed on IV Decadron 4 mg every 6 hours with significant improvement in her symptoms. At the time of discharge on 01/09/17 she was able to communicate smoothly and respond appropriately.  MRI of the brain on 01/07/17 during her hospitalization showed multiple intracranial lesions several of which contain components of T1 hyperintensity which could represent hemorrhage and or melanin. The largest of these was in the left temporoparietal region. The appearance was most suspicious for melanoma metastasis. Unfortunately, this MRI was not compared to her prior MRI at [facility].  She was seen by her primary radiation oncologist 01/14/2017 who reviewed the findings and considering she had improved on dexamethasone, made no other changes.    Unfortunately she acutely developed similar symptoms and again presented to the [facility] emergency room and had repeat imaging including an MRI which again showed mixed findings.  As a result of the MRI and her acute issues, we are asked to consult her for possible management which was the rationale for my consultation.     PAST MEDICAL HISTORY:    Past Medical History:   Diagnosis Date   ??? Cancer ([place])     melanoma - back   ??? Hypercholesterolemia    ??? Hypertension     managed with meds   ??? Nausea & vomiting    ??? Osteopenia      PAST SURGICAL HISTORY:   Past Surgical History:   Procedure Laterality Date   ??? HX COLONOSCOPY     ??? HX GYN      c-section x 5   ??? HX ORTHOPAEDIC Left 04/30/2016    knee   ??? HX OTHER SURGICAL      melanoma removed from back       MEDICATIONS:   No current facility-administered medications for this encounter.   No current outpatient prescriptions on file.    Facility-Administered Medications Ordered in Other Encounters:    ???  amLODIPine (NORVASC) tablet 10 mg, 10 mg, Oral, QHS, Katelyn Elizabeth, NP, 10 mg at 02/28/17 2124  ???  loratadine (CLARITIN) tablet 10 mg, 10 mg, Oral, DAILY PRN, Katelyn Elizabeth, NP  ???  dabrafenib cap 150 mg (Patient Supplied), 150 mg, Oral, BID, Katelyn Elizabeth, NP, 150 mg at 03/01/17 0900  ???  ferrous sulfate tablet 325 mg, 1 Tab, Oral, ACB, Katelyn Elizabeth, NP, 325 mg at 03/01/17 0805  ???  fluticasone (FLONASE) 50 mcg/actuation nasal spray 2 Spray, 2 Spray, Both Nostrils, QHS, Katelyn Elizabeth, NP, 2 Spray at 02/28/17 2124  ???  HYDROcodone-acetaminophen (NORCO) 7.5-325 mg per tablet 1 Tab, 1 Tab, Oral, Q4H PRN, Katelyn Elizabeth, NP  ???  levETIRAcetam (KEPPRA) tablet 500 mg, 500 mg, Oral, BID, Katelyn Elizabeth, NP, 500 mg at 03/01/17 0805  ???  LORazepam (ATIVAN) tablet 1 mg, 1 mg, Oral, QHS PRN, Katelyn Elizabeth, NP  ???  losartan (COZAAR) tablet 100 mg, 100 mg, Oral, DAILY, Katelyn Elizabeth, NP, 100 mg at 03/01/17 0802  ???  metoprolol tartrate (LOPRESSOR) tablet 12.5 mg, 12.5 mg, Oral, DAILY, Katelyn Elizabeth, NP, 12.5 mg at 03/01/17 7654  ???  simvastatin (ZOCOR) tablet 20 mg, 20 mg, Oral, QHS, Katelyn Elizabeth, NP,  20 mg at 02/28/17 2124  ???  traMADol (ULTRAM) tablet 50 mg, 50 mg, Oral, Q6H PRN, Katelyn Elizabeth, NP  ???  trametinib tab 2 mg (Patient Supplied), 2 mg, Oral, DAILY, Katelyn Elizabeth, NP, 2 mg at 03/01/17 0900  ???  dexamethasone (DECADRON) tablet 4 mg, 4 mg, Oral, Q6H, Katelyn Elizabeth, NP, 4 mg at 03/01/17 1205  ???  pantoprazole (PROTONIX) injection 40 mg, 40 mg, IntraVENous, DAILY, Katelyn Elizabeth, NP, 40 mg at 03/01/17 4782  ???  enoxaparin (LOVENOX) injection 40 mg, 40 mg, SubCUTAneous, Q24H, Katelyn Elizabeth, NP, 40 mg at 02/28/17 1500  ???  potassium chloride (KLOR-CON) packet 20 mEq, 20 mEq, Oral, BID WITH MEALS, Letha Cape, MD, 20 mEq at 03/01/17 0805    ALLERGIES:   Allergies   Allergen Reactions   ??? Adhesive Tape-Silicones Rash       SOCIAL HISTORY:   Social History     Social History   ??? Marital status: MARRIED      Spouse name: N/A   ??? Number of children: N/A   ??? Years of education: N/A     Occupational History   ??? Not on file.     Social History Main Topics   ??? Smoking status: Never Smoker   ??? Smokeless tobacco: Never Used   ??? Alcohol use No   ??? Drug use: Not on file   ??? Sexual activity: Not on file     Other Topics Concern   ??? Not on file     Social History Narrative       FAMILY HISTORY:   Family History   Problem Relation Age of Onset   ??? Hypertension Mother    ??? Elevated Lipids Mother    ??? Elevated Lipids Father    ??? Hypertension Father    ??? Arthritis-osteo Father      spine   ??? Heart Disease Father    ??? Breast Cancer Other    ??? Diabetes Sister        REVIEW OF SYSTEMS: Review of systems was completed through discussion with family including a 12 point review, yet the patient was not able to fully converse.     PHYSICAL EXAMINATION:   ECOG Performance status 3  VITAL SIGNS:   Visit Vitals   ??? BP 135/88 (BP 1 Location: Left arm, BP Patient Position: Supine)   ??? Pulse 91   ??? Temp 98.4 ??F (36.9 ??C)   ??? SpO2 95%        GENERAL: The patient is well-developed, and note acute distress, but not fully able to communicate nonsensical fashion. HEENT: Head is normocephalic, atraumatic. Pupils are equal, round and reactive to light and accommodation. CARDIOVASCULAR: Heart is regular rate and rhythm. There are no murmurs rubs or gallups. Radial pulses are 2+ RESPIRATORY: Lungs are clear to auscultation and percussion. There is normal respiratory effort. GASTROINTESTINAL: The abdomen is soft, non-tender, nondistended with no hepatospelnomagaly. Digital rectal examination: deferred  NEURO:  Cranial nerves II-XII grossly intact.  Muscular strength and sensation are intact throughout all four extremities but she does not fully follow commands.        PATHOLOGY:    See HPI    LABORATORY:   Lab Results   Component Value Date/Time    Sodium 137 02/28/2017 10:22 AM    Potassium 3.0 (L) 02/28/2017 10:22 AM     Chloride 96 (L) 02/28/2017 10:22 AM    CO2 33 (H) 02/28/2017 10:22 AM  Anion gap 8 02/28/2017 10:22 AM    Glucose 118 (H) 02/28/2017 10:22 AM    BUN 18 02/28/2017 10:22 AM    Creatinine 0.76 02/28/2017 10:22 AM    GFR est AA >60 02/28/2017 10:22 AM    GFR est non-AA >60 02/28/2017 10:22 AM    Calcium 9.1 02/28/2017 10:22 AM    Magnesium 2.2 02/24/2017 02:50 PM    Albumin 3.1 (L) 02/28/2017 10:22 AM    Protein, total 6.9 02/28/2017 10:22 AM    Globulin 3.8 (H) 02/28/2017 10:22 AM    A-G Ratio 0.8 (L) 02/28/2017 10:22 AM    AST (SGOT) 134 (H) 02/28/2017 10:22 AM    ALT (SGPT) 202 (H) 02/28/2017 10:22 AM     Lab Results   Component Value Date/Time    WBC 5.9 02/28/2017 10:22 AM    HGB 15.0 02/28/2017 10:22 AM    HCT 44.4 02/28/2017 10:22 AM    PLATELET 384 02/28/2017 10:22 AM       RADIOLOGY:    Mri Brain W Wo Cont    Result Date: 02/28/2017  MRI of the Brain with contrast: 02/28/2017 History: Melanoma, right-sided weakness Sequences: Axial pre and post contrast T1, FLAIR, T2, diffusion, coronal post contrast T1 and sagittal precontrast T1-weighted images of the brain. Comparison: MRI the brain 11/20/2016 13 cc of IV MultiHance   was injected to aid in the detection of intracranial pathology. Findings: There are no restricted diffusion abnormalities. The ventricles and basilar vascular flow voids are unremarkable. The pituitary and sinuses are unremarkable. There are multiple enhancing infratentorial and supratentorial lesions containing hemosiderin/melanin and restricted diffusion. The lesion in the right cerebellar hemisphere currently measures 20 mm x 27 mm as compared to the 32 mm x 25 mm on the last examination. A lesion in the left periatrial white matter formerly measured 25 mm x 32 mm and currently measures 34 mm x 37 mm. There is interval increase in degree of peritumoral edema and mass effect upon the atrium of the left lateral ventricle. A lesion in right parietal white  matter which formerly measured 13.5 mm measures 19 mm. A lesion in the superior right occipital white matter which formerly measured 16 mm now measures 21 mm. There are no new lesions. The 7th and 8th nerves and there orbits are unremarkable. There are scattered focal and confluent areas of T2 prolongation within the subcortical, periventricular, and pontine white matter.     IMPRESSION: Stable pontine and supratentorial microischemia, interval increase and decrease in some of the intracranial lesions as above with the most pronounced change being the increase in size and mass effect of the left periatrial tumor.    Ct Head Wo Cont    Result Date: 02/28/2017  Noncontrast head CT Clinical Indication: Acute confusion and dysarthria with history of melanoma and hemorrhagic brain metastases. Technique: Noncontrast axial images were obtained through the brain. Comparison: MRI 11/20/2016, CT 11/19/2016, PET CT 02/18/2017 Findings: Again demonstrated are multiple intracranial hyperdense and/or hemorrhagic metastasis system with the patient's known melanoma. This has been further evaluated on multiple previous imaging studies. The number and extent of lesions appear very similar to the recent PET/CT. There is a large amount of edema surrounding the largest mass which is in the left frontoparietal cranium. There is 4 mm of rightward midline shift which has increased from the prior MRI of 11/20/2016. The basilar cisterns are slightly effaced. No definite new hemorrhage. No hydrocephalus or developing extra-axial fluid. The bones are unchanged. No evidence  of skull fracture. The mastoids and paranasal sinuses appear aerated.     Impression: 1. Multiple intracranial metastases very similar to recent PET/CT. 2. Mass effect, and mild midline shift.     Pet/ct Tumor Image Wh Bdy W (sub)    Result Date: 02/18/2017  NUCLEAR MEDICINE WHOLE BODY CT / PET- FDG Study. INDICATION: Melanoma,  metastatic to brain. COMPARISONS: Noe November 2017. TECHNIQUE:   Radiopharmaceutical: 17.3 mCi F18-FDG IV.  This is a whole-body PET- FDG study performed on a dedicated full- ring scanner. Low dose, nondiagnostic CT axial source images are also acquired for PET attenuation correction and are utilized for PET-CT fusion.  The patient underwent adequate fasting of at least 4 hours. Blood glucose at the time of tracer administration is 124 mg/dl, which is adequate for imaging. Approximately 90 minutes after administration of the radiopharmaceutical, imaging was initiated covering the skull to the thighs. Additional high-resolution thin section images of the lower extremities were performed. FINDINGS:  HEAD AND NECK: No abnormal hypermetabolic activity. Normal physiologic activity in the brain and salivary glands. Hyperattenuating lesions in the brain have enlarged. No mass effect or herniation. CHEST: Atelectasis right lower lobe. No hypermetabolic pulmonary nodules or adenopathy. Previously described hypermetabolic activity has resolved. ABDOMEN: Hypermetabolic activity of the liver lesions has resolved. Low-attenuation liver lesions are identified along the CT scan. No new abnormal areas of hypermetabolic activity. There is normal physiologic activity GI tract and GU tract. PELVIS: Normal physiologic activity in the urinary bladder. No hypermetabolic adenopathy or bony lesions. LOWER EXTREMITIES: No abnormal hypermetabolic activity in the lower extremities.     IMPRESSION: 1) Hyperattenuating brain lesions have enlarged since prior exam. No mass effect or herniation. 2) Resolution of the hypermetabolic activity of the liver lesions and an of the lesions in the chest. No abnormal hypermetabolic activity appreciated on today's exam.       IMPRESSION:  Katelyn [name] is a 61 y.o. female with metastatic melanoma to the brain.  Unfortunately she had multiple large lesions present at  diagnosis but was able to completed treatment with fractionated stereotactic radiation.  Reviewing the MRI, while appears nearly all the lesions have somewhat increased in size, considering the response rate normally is between 80-90%, there is simply statistically a very small chance these are all progressing.  It is far more likely each are related to treatment related response and edema.  This is particularly true with the other imaging characteristics including loss of peripheral enhancement with many of the lesions having more necrotic centers.  For that reason, after speaking with her primary radiation oncologist and running the case again by neurosurgery, Dr. Raul Del, we did feel these were all treatment related.  We agreed with continued steroid..  I had a lengthy conversation with her husband and daughter about prognosis and outlining the anticipated course from here, which is also quite unpredictable.  I was so full her acute symptoms would improve on the steroid and that she would have a favorable long-term systemic and intracranial response but that it is too early to make accurate predictions.  I did outline the possibility that neurosurgical intervention with either a laser ablative therapy, or craniotomy might be necessary if her local CNS symptoms do not improve that this would come a substantial morbidity.    In the end we agreed repeat MRI in about 4 weeks would be worthwhile, and that she should check in with her primary radiation oncologist next Tuesday, Dr. Maceo Pro, and we have arranged this.  We also will initiate occupational physical therapy so that she and her family get appropriate and accurate advice on how to test function at home without injury.      PLAN:    1) Discussed treatment with external beam radiation reviewing risk, benefits, and side effects from treatment.   2) Reviewed available research treatment and cancer care protocols for  which patient may be eligible.  Unfortunately there are no matching clinical trials available at this time.    3) Coordinate care with medical oncology and neurosurgery.  4) Assumed treatment-related effect and will continue to monitor with repeat MRI and follow-up.      Portions of this note were copied from prior encounters and reviewed for accuracy, currency, and represent documentation and tasks completed during this encounter.  I verify and attest these portions to be unchanged from prior visits.    Daryll Drown, MD  03/01/17

## 2017-03-01 NOTE — Progress Notes (Signed)
Shift: 03/01/17 (0700-1900)    AM Handoff Nurse: Colletta Carrollton, RN    Pain: Pt reported left arm pain around insertion site of IV. RN removed due to slight infiltration.     Neuro: A&1-0. Pt is oriented to self on occasion. Disoriented to everything else. Clear speech occassionally. However, aphasiac most of the time with delayed speech with word salad and/or incomprehensible sounds. No reports of numbness or tingling.     Cardio: S1/S2. SR. Higher BP noted after ambulation. CV VS WNL for specified parameters.      Resp: 2.5 L NC in the am and RA in the evening. Clear bilaterally. Slight dyspnea on occasion. Symmetric chest wall excursion. Resp VS WNL.     GI/GU: Foley is patent and draining. Urine is dark yellow and clear. Last BM: 03/01/17. Stool is brown and loose (x3 loose stools throughout the day). No c/o N/V.     Diet: Tolerating diet. Poor intake noted. See I&O for further details.     Ambulation: Pt ambulates with an assist x1-2, fluctuates depending upon mentation. Chair alarm in place. No falls during the shift.     Linen Change: 03/01/17    Additional Information: Pt's husband requested foley to be removed. Pt notified NP [facility]. Received orders that  Foley can be removed but may be reinserted if needed around 1845. RN went into room to remove the foley but the pt became agitated and did not want it out. RN agreed to leave the foley in until her husband returned to discuss it with her. RN passed along the possibility for removal with the night shift nurse. Pt very disoriented. x1 episode of agitation regarding inability to communicate. Radiation consult today. Continue with POC.     EOS Handoff Nurse: Colletta [place], RN      VITAL SIGNS  Patient Vitals for the past 12 hrs:   Temp Pulse Resp BP SpO2   03/01/17 1618 98.2 ??F (36.8 ??C) 92 18 129/82 95 %   03/01/17 1209 - 74 - 140/89 -   03/01/17 1130 98.4 ??F (36.9 ??C) 89 18 (!) 142/97 94 %   03/01/17 0756 98.8 ??F (37.1 ??C) 73 18 127/79 97 %

## 2017-03-01 NOTE — Progress Notes (Signed)
Foley removed at 2048. No complications. Patient tolerated procedure well. Patient urinated a small amount immediately after foley was pulled. Will continue to monitor

## 2017-03-01 NOTE — Progress Notes (Signed)
Met with patient at bedside. Patient asked CM to speak with husband at this time. Patient lives in own home with husband and daughter. Prior to admission, patient was independent with adls, currently does not drive. Husband transports patient as needed. Has no issues getting medications. Currently has no DME at home. O2 is new, and if indicated at discharge, will require referral for home oxygen. Patient's contact is Richardson Landry, [phone]. Patient and husband state discharge plan at this time is to return home. No needs voiced at this time. CM will continue to follow for discharge needs.     Care Management Interventions  PCP Verified by CM: Yes (last seen in october)  Mode of Transport at Discharge: Other (see comment)  Transition of Care Consult (CM Consult): Discharge Planning  Current Support Network: Lives with Spouse, Own Home  Confirm Follow Up Transport: Family  Plan discussed with Pt/Family/Caregiver: Yes  Freedom of Choice Offered: Yes

## 2017-03-01 NOTE — Progress Notes (Addendum)
Pt here for inpatient  consult for brain mets.  Previously treated by Dr. Maceo Pro at [place] arrived per stretcher by transport.  O2 at 3 liters per N/C.  Hep lock to left AC.  Family here for consult.  Pt not answering questions appropriately.  Right side weakness.  Per Dr. Roosvelt Maser 1 week f/u with Dr. Maceo Pro.    Alain Honey, RN

## 2017-03-01 NOTE — Progress Notes (Addendum)
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     Allergies   Allergen Reactions   ??? Adhesive Tape-Silicones Rash       OBJECTIVE:  Patient Vitals for the past 8 hrs:   BP Temp Pulse Resp SpO2   03/01/17 1209 140/89 - 74 - -   03/01/17 1130 (!) 142/97 98.4 ??F (36.9 ??C) 89 18 94 %   03/01/17 0756 127/79 98.8 ??F (37.1 ??C) 73 18 97 %     Temp (24hrs), Avg:98.4 ??F (36.9 ??C), Min:97.5 ??F (36.4 ??C), Max:98.8 ??F (37.1 ??C)    03/12 0701 - 03/12 1900  In: 118 [P.O.:118]  Out: -     Physical Exam:  Constitutional: Well developed, well nourished female in no acute distress, sitting comfortably in the bedside chair. Family at bedside.    HEENT: Normocephalic and atraumatic. Oropharynx is clear, mucous membranes are moist.  Pupils are equal, round, and reactive to light. Extraocular muscles are intact.  Sclerae anicteric.    Skin Warm and dry.  No bruising and no rash noted.  No erythema.  No pallor.    Respiratory Lungs are clear to auscultation bilaterally without wheezes, rales or rhonchi, normal air exchange without accessory muscle use.    CVS Normal rate, regular rhythm and normal S1 and S2.  No murmurs, gallops, or rubs.   Abdomen Soft, nontender and nondistended, normoactive bowel sounds.  No palpable mass.  No hepatosplenomegaly.   Neuro Difficulty naming family members.    MSK Normal range of motion in general.  No edema and no tenderness.   Psych Pleasantly confused.         Labs:    Recent Labs      02/28/17   1022   WBC  5.9    RBC  5.02   HGB  15.0   HCT  44.4   MCV  88.4   MCH  29.9   MCHC  33.8   RDW  16.3*   PLT  384   GRANS  44   LYMPH  41   MONOS  13*   EOS  1   BASOS  0   IG  1   DF  AUTOMATED   ANEU  2.7   ABL  2.4   ABM  0.8   ABE  0.0   ABB  0.0   AIG  0.0      Recent Labs      02/28/17   1022   NA  137   K  3.0*   CL  96*   CO2  33*   AGAP  8   GLU  118*   BUN  18   CREA  0.76   GFRAA  >60   GFRNA  >60   CA  9.1   SGOT  134*   AP  174*   TP  6.9   ALB  3.1*   GLOB  3.8*   AGRAT  0.8*         Imaging:      ASSESSMENT:    Problem List  Date Reviewed: 2017-03-17          Codes Class Noted    Aphasia ICD-10-CM: R47.01  ICD-9-CM: 784.3  02/28/2017        Memory difficulties ICD-10-CM: R41.3  ICD-9-CM: 780.93  11/20/2016        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[place]) ICD-10-CM: I61.9  ICD-9-CM: 161  11/19/2016        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-10-CM: G40.109  ICD-9-CM: 345.50  11/19/2016        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  Aphasia/Confusion/Brain lesion edema  - increase Dex to 40 mg Q6h, MRI brain, consult RadOnc for input  3/12 appointment today with Dr. Roosvelt Maser. Ongoing confusion. Family feels that she is improved with steroids increased.   ??  Supportive Care  Continue home meds  Lovenox for DVT Prophylaxis  Follow [place] SOPs              Lannie Fields, NP   [facility]  [address] [place] [address]  Office : [phone]  Fax : [phone]   Patient seen, examed and discussed with NP, agree with above history/assessment/plan. 61 y.o.female met melanoma admitted for confusin and aphasia, MRI showed disease progression of left periatrial tumor, received high dose decadron with clinical improvement, consult rad onc to palliative XRT.      Dellia Cloud, M[place] [address]  Office : [phone]  Fax : [phone]

## 2017-03-02 MED FILL — FERROUS SULFATE 325 MG (65 MG ELEMENTAL IRON) TAB: 325 mg (65 mg iron) | ORAL | Qty: 1

## 2017-03-02 MED FILL — DEXAMETHASONE 4 MG TAB: 4 mg | ORAL | Qty: 1

## 2017-03-02 MED FILL — LEVETIRACETAM 500 MG TAB: 500 mg | ORAL | Qty: 1

## 2017-03-02 MED FILL — LOSARTAN 50 MG TAB: 50 mg | ORAL | Qty: 2

## 2017-03-02 MED FILL — FLUTICASONE 50 MCG/ACTUATION NASAL SPRAY, SUSP: 50 mcg/actuation | NASAL | Qty: 16

## 2017-03-02 MED FILL — AMLODIPINE 10 MG TAB: 10 mg | ORAL | Qty: 1

## 2017-03-02 MED FILL — PANTOPRAZOLE 40 MG IV SOLR: 40 mg | INTRAVENOUS | Qty: 40

## 2017-03-02 MED FILL — METOPROLOL TARTRATE 25 MG TAB: 25 mg | ORAL | Qty: 1

## 2017-03-02 MED FILL — LOVENOX 40 MG/0.4 ML SUBCUTANEOUS SYRINGE: 40 mg/0.4 mL | SUBCUTANEOUS | Qty: 0.4

## 2017-03-02 MED FILL — SIMVASTATIN 20 MG TAB: 20 mg | ORAL | Qty: 1

## 2017-03-02 MED FILL — POTASSIUM CHLORIDE 20 MEQ ORAL PACKET FOR SOLUTION: 20 mEq | ORAL | Qty: 1

## 2017-03-02 NOTE — Progress Notes (Signed)
Patient appears more restless and tense than she was at start of shift. Discussed these findings and the patient's disposition with the husband and he said that she did seem more restless and tense tonight. She is hyperalert as well. Ativan provided for restlessness and insomnia.

## 2017-03-02 NOTE — Progress Notes (Signed)
[facility]  Face to Face Encounter    Patient???s Name: Katelyn [name]    Date of Birth: 06-Feb-1956    Ordering Physician: Dr. Ronny Flurry    Primary Diagnosis: metastatic melanoma  Aphasia  Metastatic melanoma of brain [facility])    Date of Face to Face:   03/02/2017                                  Face to Face Encounter findings are related to primary reason for home care:   yes.     1. I certify that the patient needs intermittent care as follows: physical therapy: strengthening, stretching/ROM, transfer training, gait/stair training, balance training and pt/caregiver education  occupational therapy:  ADL safety (ie. cooking, bathing, dressing), ROM and pt/caregiver education    2. I certify that this patient is homebound, that is: 1) patient requires the use of a  device, special transportation, or assistance of another to leave the home; or 2) patient's condition makes leaving the home medically contraindicated; and 3) patient has a normal inability to leave the home and leaving the home requires considerable and taxing effort.  Patient may leave the home for infrequent and short duration for medical reasons, and occasional absences for non-medical reasons. Homebound status is due to the following functional limitations: Patient with strength deficits limiting the performance of all ADL's without caregiver assistance or the use of an assistive device.  Patient with poor safety awareness and is at risk for falls without assistance of another person and the use of an assistive device.  Patient with poor ambulation endurance limiting their safe ability to ascend/descend the required number of steps to leave the home.    3. I certify that this patient is under my care and that I, or a nurse practitioner or physician???s assistant, or clinical nurse specialist, or certified nurse midwife, working with me, had a Face-to-Face Encounter that meets the physician Face-to-Face Encounter requirements.  The  following are the clinical findings from the [facility] encounter that support the need for skilled services and is a summary of the encounter: see hospital chart    See hospital chart      Raeford Razor, RN  03/02/2017      THE FOLLOWING TO BE COMPLETED BY THE COMMUNITY PHYSICIAN:    I concur with the findings described above from the F2F encounter that this patient is homebound and in need of a skilled service.    Certifying Physician: _____________________________________      Printed Certifying Physician Name: _____________________________________    Date: _________________

## 2017-03-02 NOTE — Progress Notes (Addendum)
Problem: Self Care Deficits Care Plan (Adult)  Goal: *Acute Goals and Plan of Care (Insert Text)  1. Patient will feed self entire meal with SBA and tactile/visual cues with adaptive utensils as needed.   2. Patient will complete upper body dressing and bathing with minimal assistance and adaptive equipment as needed.   3. Patient will complete functional transfers with family/caregiver with CGA and caregiver education on use of tactile cues to guide patient.   4. Patient will participate in toileting tasks with minimal assistance and tactile guiding to improve independence with self-care.   5. Patient will participate in self-grooming tasks with SBA to improve independence with ADL.      Timeframe: 7 visits       OCCUPATIONAL THERAPY: Initial Assessment, Treatment Day: 1st and AM 03/02/2017  INPATIENT: Hospital Day: 3  Payor: [employer] / Plan: [place] HMO / Product Type: HMO /      NAME/AGE/GENDER: Katelyn [name] is a 61 y.o. 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 Generalized Muscle Weakness (M62.81)  ?? Other lack of cordination (R27.8)   Precautions/Allergies:    Adhesive tape-silicones      ASSESSMENT:     Katelyn [name] presents to the hospital with metastatic melanoma with brain mets. Pt arrived from home with increases AMS and aphasia with some R sided weakness of the UE. Pt was sitting up in chair with spouse/daughter at bedside. Pt's family have a lot of good questions regarding set-up of home and assisting patient with functional transfers. Pt currently significantly limited by aphasia (limited expression/comprehension). Pt not following many verbal commands or able to answer questions today. Worked with family training on using tactile cues/guiding to assist the patient with transfers and pushing up from surface/reaching back to  surface. Pt is CGA with all observed transfers and SBA for guiding pt with functional mobility. Pt had trouble processing how to wipe today with toileting and daughter had to assist. Pt stood at sink and washed hands. Pt needed guiding of hands to the water. Visual deficits are likely with pt having difficulty seeing where to place hands/reach for objects (no formal assessment due to decreased cognition). Pt needed maximal cues to set-up toothbrush and to place in hand and guide to mouth, but afterwards pt did well with the actual motion of brushing teeth. Pt was able to use R UE with task. Pt returned back to chair at end of session. Pt's family extremely supportive (spouse does appear a bit overwhelmed which is completely understandable). Pt would likely be able to go home with family due to having good support and benefiting from being with familiar people/environment. Pt is currently functioning below baseline and will benefit from OT services to address stated goals and plan of care.     This section established at most recent assessment   PROBLEM LIST (Impairments causing functional limitations):  1. Decreased Strength  2. Decreased ADL/Functional Activities  3. Decreased Transfer Abilities  4. Decreased Ambulation Ability/Technique  5. Decreased Balance  6. Decreased Independence with Home Exercise Program  7. Decreased Cognition   INTERVENTIONS PLANNED: (Benefits and precautions of occupational therapy have been discussed with the patient.)  1. Activities of daily living training  2. Adaptive equipment training  3. Balance training  4. Clothing management  5. Cognitive training  6. Donning&doffing training  7. Neuromuscular re-eduation  8. Therapeutic activity  9. Therapeutic exercise     TREATMENT PLAN: Frequency/Duration: Follow patient 3 times per week to address above goals.  Rehabilitation Potential For Stated Goals: FAIR to GOOD     RECOMMENDED REHABILITATION/EQUIPMENT: (at time of discharge pending  progress): Due to the probability of continued deficits (see above) this patient will likely need continued skilled occupational therapy after discharge.  Equipment:   ? TBD              OCCUPATIONAL PROFILE AND HISTORY:   History of Present Injury/Illness (Reason for Referral):  See H&P  Past Medical History/Comorbidities:   Katelyn [name]  has a past medical history of Cancer ([place]); Hypercholesterolemia; Hypertension; Nausea & vomiting; and Osteopenia. She also has no past medical history of Adverse effect of anesthesia; Difficult intubation; Malignant hyperthermia due to anesthesia; or Pseudocholinesterase deficiency.  Katelyn [name]  has a past surgical history that includes hx colonoscopy; hx gyn; hx other surgical; and hx orthopaedic (Left, 04/30/2016).  Social History/Living Environment:   Home Environment: Private residence  One/Two Story Residence: Two story  Living Alone: No  Support Systems: Family member(s), Spouse/Significant Other/Partner  Patient Expects to be Discharged to:: Private residence  Current DME Used/Available at Home: None  Tub or Shower Type: Shower (Garden tub (prefers this over shower))  Prior Level of Function/Work/Activity:  Pt lives at home with her spouse. Pt has multiple daughters that assist with mother's care, while spouse is working full-time during the day. Prior to admission pt was still participating in ADL with some assistance and bathing in garden tub. Pt has no equipment at home.   Personal Factors:          Past/Current Experience:  Hx of cancer with brain mets         Overall Behavior:  Cooperative but confused and not following verbal command well        Other factors that influence how disability is experienced by the patient:  Aphasia (difficulty with expression and comprehension)   Number of Personal Factors/Comorbidities that affect the Plan of Care: Extensive review of physical, cognitive, and psychosocial performance (3+):  HIGH COMPLEXITY    ASSESSMENT OF OCCUPATIONAL PERFORMANCE::   Activities of Daily Living:           Basic ADLs (From Assessment) Complex ADLs (From Assessment)   Basic ADL  Feeding: Moderate assistance  Oral Facial Hygiene/Grooming: Moderate assistance  Bathing: Moderate assistance  Upper Body Dressing: Moderate assistance  Lower Body Dressing: Maximum assistance  Toileting: Maximum assistance Instrumental ADL  Meal Preparation: Total assistance  Homemaking: Total assistance  Medication Management: Total assistance  Financial Management: Total assistance   Grooming/Bathing/Dressing Activities of Daily Living     Cognitive Retraining  Safety/Judgement: Home safety                 Functional Transfers  Toilet Transfer : Contact guard assistance  Tub Transfer: Moderate assistance  Shower Transfer: Minimum assistance     Bed/Mat Mobility  Sit to Stand: Contact guard assistance  Bed to Chair: Contact guard assistance       Most Recent Physical Functioning:   Gross Assessment:  AROM: Generally decreased, functional  Strength: Generally decreased, functional  Coordination: Generally decreased, functional (decreased in R UE)               Posture:  Posture (WDL): Within defined limits  Balance:  Sitting: Intact  Standing: Intact;With support  Standing -  Static: Good  Standing - Dynamic : Good Bed Mobility:     Wheelchair Mobility:     Transfers:  Sit to Stand: Contact guard assistance  Stand to Sit: Contact guard assistance  Bed to Chair: Contact guard assistance              Patient Vitals for the past 6 hrs:   BP BP Patient Position SpO2 Pulse   03/02/17 0707 117/72 At rest 93 % 79       Mental Status  Neurologic State: Alert, Confused  Orientation Level: Unable to verbalize  Cognition: Decreased attention/concentration, Decreased command following  Perception: Cues to attend to right side of body  Perseveration: Perseverates during ADLS, Perseverates during conversation  Safety/Judgement: Home safety                           Physical Skills Involved:  1. Balance  2. Strength  3. Activity Tolerance  4. Fine Motor Control  5. Gross Motor Control  6. Vision Cognitive Skills Affected (resulting in the inability to perform in a timely and safe manner):  1. Perception  2. Executive Function  3. Short Term Recall  4. Sustained Attention  5. Divided Attention  6. Comprehension  7. Expression Psychosocial Skills Affected:  1. Habits/Routines  2. Environmental Adaptation  3. Self-Awareness   Number of elements that affect the Plan of Care: 5+:  HIGH COMPLEXITY   CLINICAL DECISION MAKING:   KB Home	[place] AM-PAC??? ???6 Clicks???   Daily Activity Inpatient Short Form  How much help from another person does the patient currently need... Total A Lot A Little None   1.  Putting on and taking off regular lower body clothing?   []  1   [x]  2   []  3   []  4   2.  Bathing (including washing, rinsing, drying)?   []  1   [x]  2   []  3   []  4   3.  Toileting, which includes using toilet, bedpan or urinal?   []  1   [x]  2   []  3   []  4   4.  Putting on and taking off regular upper body clothing?   []  1   [x]  2   []  3   []  4   5.  Taking care of personal grooming such as brushing teeth?   []  1   [x]  2   []  3   []  4   6.  Eating meals?   []  1   [x]  2   []  3   []  4   ?? 2007, Trustees of [place], under license to [place]. All rights reserved      Score:  Initial: 12    Most Recent: X (Date: -- )    Interpretation of Tool:  Represents activities that are increasingly more difficult (i.e. Bed mobility, Transfers, Gait).   Score 24 23 22-20 19-15 14-10 9-7 6     Modifier CH CI CJ CK CL CM CN      ? Self Care:    [phone] - CURRENT STATUS: CL - 60%-79% impaired, limited or restricted   B1517 - GOAL STATUS: CK - 40%-59% impaired, limited or restricted   O1607 - D/C STATUS:  ---------------To be determined---------------  Payor: [employer] / Plan: [place] HMO / Product Type: HMO /      Medical Necessity:      ?? Patient demonstrates good and  fair rehab potential due to higher previous functional level.  Reason for Services/Other Comments:  ?? Patient continues to require skilled intervention due to significant cognitive/communication difficulities impacting independence with ADL.   Use of outcome tool(s) and clinical judgement create a POC that gives a: HIGH COMPLEXITY         TREATMENT:   (In addition to Assessment/Re-Assessment sessions the following treatments were rendered)     Pre-treatment Symptoms/Complaints:    Pain: Initial:   Pain Intensity 1: 0  Post Session:  0/10     Self Care: (10): Procedure(s) (per grid) utilized to improve and/or restore self-care/home management as related to dressing, toileting and grooming. Required maximal visual and tactile cueing to facilitate activities of daily living skills and compensatory activities.    Braces/Orthotics/Lines/Etc:   ?? O2 Device: Room air  Treatment/Session Assessment:    ?? Response to Treatment:  Pt tolerated with decreased verbal command following; Relies on tactile cues  ?? Interdisciplinary Collaboration:   o Physical Therapist  o Occupational Therapist  o Physician  ?? After treatment position/precautions:   o Up in chair  o Bed/Chair-wheels locked  o Call light within reach  o Family at bedside  o Nurse at bedside   ?? Compliance with Program/Exercises: Will assess as treatment progresses.  ?? Recommendations/Intent for next treatment session:  "Next visit will focus on advancements to more challenging activities and reduction in assistance provided".  Total Treatment Duration:  OT Patient Time In/Time Out  Time In: 1039  Time Out: [place], OT

## 2017-03-02 NOTE — Progress Notes (Addendum)
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 Roosvelt Maser increase confusion related to decrease in steroid  FU one week with Dr. Maceo Pro      ROS: unable to assess      10 point review of systems is otherwise negative with the exception of the elements mentioned above in the HPI.     Allergies   Allergen Reactions   ??? Adhesive Tape-Silicones Rash       OBJECTIVE:  Patient Vitals for the past 8 hrs:   BP Temp Pulse Resp SpO2   03/02/17 0707 117/72 98.9 ??F (37.2 ??C) 79 18 93 %     Temp (24hrs), Avg:98.5 ??F (36.9 ??C), Min:98.2 ??F (36.8 ??C), Max:98.9 ??F (37.2 ??C)    03/13 0701 - 03/13 1900  In: 87 [P.O.:480]  Out: -     Physical Exam:  Constitutional: Well developed, well nourished female in no acute distress, sitting comfortably in the bedside chair. Family at bedside.    HEENT: Normocephalic and atraumatic. Oropharynx is clear, mucous membranes are moist.  Pupils are equal, round, and reactive to light. Extraocular muscles are intact.  Sclerae anicteric.    Skin Warm and dry.  No bruising and no rash noted.  No erythema.  No pallor.    Respiratory Lungs are clear to auscultation bilaterally without wheezes, rales or rhonchi, normal air exchange without accessory muscle use.    CVS Normal rate, regular rhythm and normal S1 and S2.  No murmurs, gallops, or rubs.   Abdomen Soft, nontender and nondistended, normoactive bowel sounds.  No palpable mass.  No hepatosplenomegaly.   Neuro Difficulty naming family members.    MSK Normal range of motion in general.  No edema and no tenderness.   Psych Pleasantly confused.         Labs:      Recent Labs      03/01/17   1815  02/28/17   1022   WBC  6.9  5.9   RBC  4.93  5.02    HGB  15.2  15.0   HCT  44.5  44.4   MCV  90.3  88.4   MCH  30.8  29.9   MCHC  34.2  33.8   RDW  16.2*  16.3*   PLT  352  384   GRANS  73  44   LYMPH  22  41   MONOS  5  13*   EOS  0*  1   BASOS  0  0   IG  0  1   DF  AUTOMATED  AUTOMATED   ANEU  5.0  2.7   ABL  1.5  2.4   ABM  0.4  0.8   ABE  0.0  0.0   ABB  0.0  0.0   AIG  0.0  0.0        Recent Labs      03/01/17   1815  02/28/17   1022   NA  132*  137   K  4.2  3.0*   CL  92*  96*   CO2  29  33*   AGAP  11  8   GLU  237*  118*   BUN  21  18   CREA  0.84  0.76   GFRAA  >60  >60   GFRNA  >60  >60   CA  8.8  9.1   SGOT  60*  134*   AP  160*  174*   TP  6.7  6.9   ALB  3.0*  3.1*   GLOB  3.7*  3.8*   AGRAT  0.8*  0.8*   MG  2.0   --          Imaging:      ASSESSMENT:    Problem List  Date Reviewed: 18-Mar-2017          Codes Class Noted    Aphasia ICD-10-CM: R47.01  ICD-9-CM: 784.3  02/28/2017        Memory difficulties ICD-10-CM: R41.3  ICD-9-CM: 780.93  11/20/2016        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[place]) ICD-10-CM: I61.9  ICD-9-CM: 449  11/19/2016        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[place]) ICD-10-CM: G40.109  ICD-9-CM: 345.50  11/19/2016        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[place]) ICD-10-CM: C79.31  ICD-9-CM: 198.3  11/18/2016        Liver lesion ICD-10-CM: K76.9  ICD-9-CM: 573.8  10/16/2016        Osteopenia ICD-10-CM: M85.80  ICD-9-CM: 733.90  08/18/2013        HTN (hypertension) (Chronic) ICD-10-CM: I10  ICD-9-CM: 401.9  08/18/2013        Hyperlipidemia ICD-10-CM: E78.5  ICD-9-CM: 272.4  08/18/2013                PLAN:    Metastatic Melanoma to Brain  - currently on Tafinlar and Mekinist (since 11/2016), Dex 4 mg AM, 2 mg PM  -continue Tafinlar and Mekinist  -sp cyberknife  3/12 MRI brain-increase in size and mass effect of left periatrial tumor  3/13 FU appointment with Dr. Maceo Pro scheduled next week  ??  Aphasia/Confusion/Brain lesion edema  - increase Dex to 40 mg Q6h, MRI brain, consult RadOnc for input   3/12 appointment today with Dr. Roosvelt Maser. Ongoing confusion. Family feels that she is improved with steroids increased.   3/13 Dr.Kilburn feels that increased confusion related to decrease in steroids. FU Dr. Maceo Pro next week.   ??  Supportive Care  Continue home meds  Lovenox for DVT Prophylaxis  Follow [facility] SOPs              Lannie Fields, NP   [facility]  [address] [place] [address]  Office : [phone]  Fax : [phone]   Patient seen, examed and discussed with NP, agree with above history/assessment/plan. 61 y.o.female met melanoma admitted for confusin and aphasia, MRI showed disease progression of left periatrial tumor, received high dose decadron with clinical improvement, consult rad onc and considered pseudoprogression, increased steroid and husband felt pt improved daily.  ??    Dellia Cloud, M.D.  [place] [address]  Office : [phone]  Fax : [phone]

## 2017-03-02 NOTE — Progress Notes (Addendum)
Problem: Mobility Impaired (Adult and Pediatric)  Goal: *Acute Goals and Plan of Care (Insert Text)  1.  Katelyn [name] will be independent with sit to stand and bed to chair in 3 days.  2.  Katelyn [name] will perform gait independently 500 ft in 3 days.  3.  Katelyn [name] will go up and down 10 steps with rail in 3 days.    PHYSICAL THERAPY: Initial Assessment 03/02/2017  INPATIENT: Hospital Day: 3  Payor: [employer] park manager / Plan: [place] [employer] / Product Type: HMO /      NAME/AGE/GENDER: Katelyn [name] is a 61 y.o. female   PRIMARY DIAGNOSIS: metastatic melanoma  Aphasia  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[place]) <principal problem not specified> <principal problem not specified>        ICD-10: Treatment Diagnosis:   ?? Difficulty in walking, Not elsewhere classified (R26.2)   Precaution/Allergies:  Adhesive tape-silicones      ASSESSMENT:     Katelyn [name] presents with confusion, aphasia, slightly decreased gait.  She has metastatic melanoma.  She was originally diagnosed in November.  This is apparently her 3rd episode.  Her husband Richardson Landry and daughter Judson Roch are present.  She has 5 children all who live locally.  Katelyn [name] can not tell me who either of them are.  In fact she was talkative but nothing was appropriate to conversation or situation.  Katelyn [name] required supervision to contact guard with no loss of balance with gait 300 ft.  Shuffling gait with altered arm swing.  Obvious visual deficits.  Spend a long time discussing guarding techniques that the family can use if she requires more assist.  Talked a lot about home environment.  In this situation a home PT evaluation would be so important.  Katelyn [name] is appropriate for skilled PT to maximize her rehab potential.     This section established at most recent assessment   PROBLEM LIST (Impairments causing functional limitations):  1. Decreased Ambulation Ability/Technique  2. Decreased Balance  3. Decreased Activity Tolerance    INTERVENTIONS PLANNED: (Benefits and precautions of physical therapy have been discussed with the patient.)  1. Gait Training  2. Therapeutic Activites  3. Transfer Training     TREATMENT PLAN: Frequency/Duration: 3 times a week for duration of hospital stay  Rehabilitation Potential For Stated Goals: Good     RECOMMENDED REHABILITATION/EQUIPMENT: (at time of discharge pending progress): Due to the probability of continued deficits (see above) this patient will likely need continued skilled physical therapy after discharge.  Equipment:   ? None at this time              HISTORY:   History of Present Injury/Illness (Reason for Referral):  Katelyn [name] is a 61 y.o. female admitted on 02/28/2017 with a primary diagnosis of Aphasia, confusion and metastatic melanoma.  Per her husband and daughter, she woke this morning with aphasia, confusion and some ride sided weakness.  She has been doing very well up until today.  She has not had any fevers or recent infections.  No GI or bowel complaints.  Good energy and appetite.  She is a known patient of Dr Jenetta Loges and has been on [place] and [place] and has been taking it regularly.  She is also known to Dr Maceo Pro and has had cyberknife to her brain mets.  Husband states 2 weeks ago her decadron was decreased from 4 mg BID to 66m in AM and 2 mg in PM.  He states the  last time her steroids were reduced she had an "episode" that he states is stroke like and resolved in 36 hours [facility]) after steroids were increased.  She was taken to the [facility] today and then transferred downtown to be admitted to our service.  A CT of her head was performed and showed edema around the lesion on the left.     ??  Past Medical History/Comorbidities:   Katelyn [name]  has a past medical history of Cancer ([place]); Hypercholesterolemia; Hypertension; Nausea & vomiting; and Osteopenia. She also has no past medical history of Adverse effect of anesthesia;  Difficult intubation; Malignant hyperthermia due to anesthesia; or Pseudocholinesterase deficiency.  Katelyn [name]  has a past surgical history that includes hx colonoscopy; hx gyn; hx other surgical; and hx orthopaedic (Left, 04/30/2016).  Social History/Living Environment:   Home Environment: Private residence  One/Two Story Residence: One story  Living Alone: No  Support Systems: Copy, Family member(s)  Patient Expects to be Discharged to:: Private residence  Current DME Used/Available at Home: None  Tub or Shower Type: Tub/Shower combination  Prior Level of Function/Work/Activity:  She able to perform all gait tasks independently  Even up and down stairs.  met. Melanoma   Number of Personal Factors/Comorbidities that affect the Plan of Care: 1-2: MODERATE COMPLEXITY   EXAMINATION:   Most Recent Physical Functioning:   Gross Assessment:  AROM: Generally decreased, functional               Posture:  Posture (WDL): Within defined limits  Balance:  Sitting: Intact  Standing: Impaired  Standing - Static: Good  Standing - Dynamic : Good Bed Mobility:     Wheelchair Mobility:     Transfers:  Sit to Stand: Stand-by assistance  Stand to Sit: Stand-by assistance  Gait:     Base of Support: Narrowed  Speed/Cadence: Slow;Shuffled  Step Length: Right shortened;Left shortened  Swing Pattern: Right asymmetrical;Left asymmetrical  Gait Abnormalities: Altered arm swing;Shuffling gait  Distance (ft): 300 Feet (ft)  Ambulation - Level of Assistance: Supervision;Contact guard assistance  Interventions: Tactile cues      Body Structures Involved:  1. Muscles Body Functions Affected:  1. Movement Related Activities and Participation Affected:  1. Mobility   Number of elements that affect the Plan of Care: 3: MODERATE COMPLEXITY   CLINICAL PRESENTATION:   Presentation: Evolving clinical presentation with changing clinical characteristics: MODERATE COMPLEXITY   CLINICAL DECISION MAKING:    [place] AM-PAC??? ???6 Clicks???   Basic Mobility Inpatient Short Form  How much difficulty does the patient currently have... Unable A Lot A Little None   1.  Turning over in bed (including adjusting bedclothes, sheets and blankets)?   '[]'  1   '[]'  2   '[x]'  3   '[]'  4   2.  Sitting down on and standing up from a chair with arms ( e.g., wheelchair, bedside commode, etc.)   '[]'  1   '[]'  2   '[x]'  3   '[]'  4   3.  Moving from lying on back to sitting on the side of the bed?   '[]'  1   '[]'  2   '[x]'  3   '[]'  4   How much help from another person does the patient currently need... Total A Lot A Little None   4.  Moving to and from a bed to a chair (including a wheelchair)?   '[]'  1   '[]'  2   '[x]'  3   '[]'  4  5.  Need to walk in hospital room?   '[]'  1   '[]'  2   '[x]'  3   '[]'  4   6.  Climbing 3-5 steps with a railing?   '[]'  1   '[]'  2   '[x]'  3   '[]'  4   ?? 2007, [employer] [place], under license to [employer]. All rights reserved      Score:  Initial: 18 Most Recent: X (Date: -- )    Interpretation of Tool:  Represents activities that are increasingly more difficult (i.e. Bed mobility, Transfers, Gait).   Score 24 23 22-20 19-15 14-10 9-7 6     Modifier CH CI CJ CK CL CM CN      ? Mobility - Walking and Moving Around:    [phone] - CURRENT STATUS: CK - 40%-59% impaired, limited or restricted   [id number] - GOAL STATUS: CK - 40%-59% impaired, limited or restricted   J8841 - D/C STATUS:  ---------------To be determined---------------  Payor: [employer] / Plan: [place] [employer] / Product Type: HMO /      Medical Necessity:     ?? Patient is expected to demonstrate progress in functional technique to increase independence with mobility and gait..  Reason for Services/Other Comments:  ?? Patient continues to require present interventions due to patient's inability to function at baseline..   Use of outcome tool(s) and clinical judgement create a POC that gives a: Questionable prediction of patient's progress: MODERATE COMPLEXITY             TREATMENT:   (In addition to Assessment/Re-Assessment sessions the following treatments were rendered)   Pre-treatment Symptoms/Complaints:  none  Pain: Initial:   Pain Intensity 1: 0  Post Session:  none            Therapeutic Activity: (    15):  Therapeutic activities including Ambulation on level ground to improve mobility.  Required minimal Tactile cues to promote motor control of bilateral, upper extremity(s), lower extremity(s).       Assessment/                 Braces/Orthotics/Lines/Etc:   ?? O2 Device: Room air  Treatment/Session Assessment:    ?? Response to Treatment:  good  ?? Interdisciplinary Collaboration:   o Registered Nurse  ?? After treatment position/precautions:   o Up in chair  o Call light within reach  o RN notified   ?? Compliance with Program/Exercises: Will assess as treatment progresses.  ?? Recommendations/Intent for next treatment session:  "Next visit will focus on advancements to more challenging activities and reduction in assistance provided".  Total Treatment Duration:  PT Patient Time In/Time Out  Time In: 1015  Time Out: [place], PT

## 2017-03-02 NOTE — Progress Notes (Signed)
END OF SHIFT NOTE:    Intake/Output  03/12 1901 - 03/13 0700  In: 310 [I.V.:310]  Out: 800 [Urine:800]   Voiding: YES  Catheter: NO  Drain:              Stool:  1 occurrences.    Stool Assessment  Stool Color: Brown (03/01/17 1618)  Stool Appearance: Soft (03/01/17 1945)  Stool Amount: Small (03/01/17 1618)  Stool Source/Status: Rectum (03/01/17 1618)    Emesis:  0 occurrences.          VITAL SIGNS  Patient Vitals for the past 12 hrs:   Temp Pulse Resp BP SpO2   03/02/17 0353 98.7 ??F (37.1 ??C) 73 18 141/90 96 %   03/01/17 2311 98.4 ??F (36.9 ??C) 79 18 100/68 94 %   03/01/17 2021 98.5 ??F (36.9 ??C) 83 18 111/75 93 %       Pain Assessment  Pain 1  Pain Scale 1: Numeric (0 - 10) (03/02/17 0200)  Pain Intensity 1: 0 (03/02/17 0200)  Patient Stated Pain Goal: 0 (03/02/17 0200)    Ambulating  Yes    Shift report given to oncoming nurse at the bedside.    Duard Larsen

## 2017-03-02 NOTE — Progress Notes (Signed)
CM sent referral to [facility] for home health PT/OT.  Referral sent to [facility] for 3 in 1 commode.

## 2017-03-02 NOTE — Progress Notes (Signed)
Shift: 03/02/17 (0700-1900)  ??  AM Handoff Nurse: Colletta Steil[place]oom, RN  ??  Pain: No c/o pain during the shift.   ??  Neuro: A&1-0. Pt is oriented to self on occasion. Disoriented to everything else. Clear speech occassionally. However, aphasiac most of the time with delayed speech with word salad and/or incomprehensible sounds. No reports of numbness or tingling.   ??  Cardio: S1/S2. SR. CV VS WNL for specified parameters.    ??  Resp: RA.Marland Kitchen Clear bilaterally. Symmetric chest wall excursion. Resp VS WNL.   ??  GI/GU: Voiding. Urine is yellow and clear. Last BM: 03/02/17. No c/o N/V.   ??  Diet: Tolerating diet. Adequate intake noted. See I&O for further details.   ??  Ambulation: Pt ambulates with an assist x1-2, fluctuates depending upon mentation. Chair alarm in place. No falls during the shift.   ??   Linen Change: 03/02/17  ??  Additional Information: Continue with POC.   ??  EOS Handoff Nurse: Catalina Antigua, RN        VITAL SIGNS  Patient Vitals for the past 12 hrs:   Temp Pulse Resp BP SpO2   03/02/17 1529 98.1 ??F (36.7 ??C) 87 18 131/88 94 %

## 2017-03-03 MED ORDER — PANTOPRAZOLE 40 MG TAB, DELAYED RELEASE
40 mg | ORAL_TABLET | Freq: Every day | ORAL | 0 refills | Status: DC
Start: 2017-03-03 — End: 2017-04-14

## 2017-03-03 MED ORDER — DEXAMETHASONE 4 MG TAB
4 mg | ORAL_TABLET | Freq: Four times a day (QID) | ORAL | 0 refills | Status: AC
Start: 2017-03-03 — End: 2017-04-02

## 2017-03-03 MED FILL — METOPROLOL TARTRATE 25 MG TAB: 25 mg | ORAL | Qty: 1

## 2017-03-03 MED FILL — DEXAMETHASONE 4 MG TAB: 4 mg | ORAL | Qty: 1

## 2017-03-03 MED FILL — AMLODIPINE 10 MG TAB: 10 mg | ORAL | Qty: 1

## 2017-03-03 MED FILL — LEVETIRACETAM 500 MG TAB: 500 mg | ORAL | Qty: 1

## 2017-03-03 MED FILL — LORAZEPAM 1 MG TAB: 1 mg | ORAL | Qty: 1

## 2017-03-03 MED FILL — FERROUS SULFATE 325 MG (65 MG ELEMENTAL IRON) TAB: 325 mg (65 mg iron) | ORAL | Qty: 1

## 2017-03-03 MED FILL — LOSARTAN 50 MG TAB: 50 mg | ORAL | Qty: 2

## 2017-03-03 MED FILL — SIMVASTATIN 20 MG TAB: 20 mg | ORAL | Qty: 1

## 2017-03-03 MED FILL — PANTOPRAZOLE 40 MG IV SOLR: 40 mg | INTRAVENOUS | Qty: 40

## 2017-03-03 MED FILL — POTASSIUM CHLORIDE 20 MEQ ORAL PACKET FOR SOLUTION: 20 mEq | ORAL | Qty: 1

## 2017-03-03 NOTE — Discharge Summary (Signed)
Discharge Summary by Steele Berg, MD at 03/03/17 1337                Author: Steele Berg, MD  Service: Nurse Practitioner  Author Type: Physician       Filed: 03/03/17 2301  Date of Service: 03/03/17 1337  Status: Addendum          Editor: Steele Berg, MD (Physician)          Related Notes: Original Note by Lannie Fields, NP (Nurse Practitioner) filed at 03/03/17 [facility]: Inpatient Hematology / Oncology Discharge Summary Note      Patient ID:   Katelyn [name]   [id number]   60 y.o.   1956/07/24      Admit Date: 02/28/2017      Discharge Date: 03/03/2017      Admission Diagnoses: metastatic melanoma   Aphasia   Metastatic melanoma of brain [facility])      Discharge Diagnoses:   Principal Diagnosis: <principal problem not specified>   Active Problems:     Metastatic melanoma of brain [facility]) (11/18/2016)        Aphasia (02/28/2017)            Hospital Course:   Katelyn [name] is a 61 y.o. female admitted on 02/28/2017 with a primary diagnosis of Aphasia, confusion and metastatic melanoma.  Per her husband and daughter,  she woke the morning of admission with aphasia, confusion and some ride sided weakness.  She had been doing very well up until then. She is a known patient of Dr Jenetta Loges and has been on [place] and [place] and has been taking it regularly.  She is also  known to Dr Maceo Pro and has had cyberknife to her brain mets.  Husband stated that 2 weeks ago her decadron was decreased from 4 mg BID to 11m in AM and 2 mg in PM.  He stated the last time her steroids were reduced she had an "episode" that he states  was stroke like and resolved in 36 hours ([facility] after steroids were increased. A CT of her head was performed and showed edema around the lesion on the left. MRI brain-increase in size and mass effect of left periatrial tumor. Her steroids  were increased to 448mq 6 hours. She was seen by Dr. KiRoosvelt Masernd it was felt that increased confusion was  related to decrease in steroids. She has an appointment with Dr. FrMaceo Proext week. Her confusion has pretty much remained the same. She is eating  well and is able to get about with minimal assistance. Patient has tremendous amount of support at home and her husband would like to take her home. She will follow up with Dr. DyJenetta Logesext week.      ??   Consults:   IP CONSULT TO RADIATION ONCOLOGY      Pertinent Diagnostic Studies:    Labs:       Recent Labs            03/01/17    1815     WBC   6.9     HGB   15.2     PLT   352        ANEU   5.0        Recent Labs            03/01/17  1815     NA   132*     K   4.2     CL   92*     CO2   29     GLU   237*     BUN   21     CREA   0.84     CA   8.8     SGOT   60*     AP   160*     TP   6.7     ALB   3.0*        MG   2.0                 OBJECTIVE:   Patient Vitals for the past 8 hrs:             BP  Temp  Pulse  Resp  SpO2  Weight     03/03/17 1132  (!) 156/97  98.5 ??F (36.9 ??C)  88  18  95 %  -     03/03/17 0717  140/87  98.7 ??F (37.1 ??C)  (!) 104  18  95 %  -     03/03/17 0555  -  -  -  -  -  145 lb 11.2 oz (66.1 kg)        Temp (24hrs), Avg:98.2 ??F (36.8 ??C), Min:97.4 ??F (36.3 ??C), Max:98.7 ??F (37.1 ??C)      03/14 0701 - 03/14 1900   In: 74 [P.O.:238]   Out: -       Physical Exam:      Constitutional:  Well developed, well nourished female in no acute  distress, sitting comfortably in bed. Family at bedside.         HEENT:  Normocephalic and atraumatic. Oropharynx is clear, mucous membranes are moist. Extraocular muscles are intact.  Sclerae anicteric.      Skin  Warm and dry.  No bruising and no rash noted.  No erythema.  No pallor.      Respiratory  Lungs are clear to auscultation bilaterally without wheezes, rales or rhonchi, normal air exchange without accessory muscle use.      CVS  Normal rate, regular rhythm and normal S1 and S2.  No murmurs, gallops, or rubs.     Abdomen  Soft, nontender and nondistended, normoactive bowel sounds.     Neuro  Recognizes family  members but can't recall names.     MSK  Normal range of motion in general.  No edema and no tenderness.     Psych  Pleasantly confused.            ASSESSMENT:      Active Problems:     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[place]) (11/18/2016)        Aphasia (02/28/2017)           Current Discharge Medication List              START taking these medications          Details        pantoprazole (PROTONIX) 40 mg tablet  Take 1 Tab by mouth daily.   Qty: 30 Tab, Refills:  0                     CONTINUE these medications which have CHANGED          Details        dexamethasone (DECADRON) 4  mg tablet  Take 1 Tab by mouth every six (6) hours for 30 days.   Qty: 120 Tab, Refills:  0                     CONTINUE these medications which have NOT CHANGED          Details        metoprolol tartrate (LOPRESSOR) 25 mg tablet  Take 12.5 mg by mouth.               losartan (COZAAR) 100 mg tablet  Take 100 mg by mouth.               levETIRAcetam (KEPPRA) 500 mg tablet  Take 1 Tab by mouth two (2) times a day.   Qty: 60 Tab, Refills:  11               dabrafenib (TAFINLAR) 75 mg cap  Take 2 Caps by mouth two (2) times a day.   Qty: 120 Cap, Refills:  3               trametinib (MEKINIST) 2 mg tab  Take 1 Tab by mouth daily.   Qty: 30 Tab, Refills:  3               simvastatin (ZOCOR) 20 mg tablet  Take 1 Tab by mouth nightly.   Qty: 90 Tab, Refills:  3          Associated Diagnoses: Hyperlipidemia, unspecified hyperlipidemia type               amLODIPine (NORVASC) 10 mg tablet  Take 1 Tab by mouth nightly.   Qty: 90 Tab, Refills:  3          Associated Diagnoses: Essential hypertension               ferrous sulfate (IRON) 325 mg (65 mg iron) tablet  Take  by mouth Daily (before breakfast).               fluticasone (FLONASE) 50 mcg/actuation nasal spray  2 Sprays by Both Nostrils route nightly.               calcium-cholecalciferol, d3, (CALCIUM 600 + D) 600-125 mg-unit tab  Take  by mouth three (3) times daily.               cetirizine  (ZYRTEC) 10 mg tablet  Take  by mouth.               traMADol (ULTRAM) 50 mg tablet  Take 1 Tab by mouth every six (6) hours as needed for Pain. Max Daily Amount: 200 mg. Indications: Pain   Qty: 60 Tab, Refills:  0          Associated Diagnoses: Low back pain, unspecified back pain laterality, unspecified chronicity,  with sciatica presence unspecified               acetaminophen (TYLENOL) 325 mg tablet  Take 650 mg by mouth.               HYDROcodone-acetaminophen (NORCO) 7.5-325 mg per tablet  Take 1 Tab by mouth every four (4) hours as needed. Max Daily Amount: 6 Tabs.   Qty: 20 Tab, Refills:  0               LORazepam (ATIVAN) 1 mg tablet  Take 1 Tab by mouth nightly as needed (insomnia). Max Daily  Amount: 1 mg.   Qty: 30 Tab, Refills:  0                         DISPOSITION:     Follow-up Appointments       Procedures        ?  FOLLOW UP VISIT Appointment in: One Week Please make one week follow up with Dr.Dyar             Please make one week follow up with Dr.Dyar              Standing Status:    Standing         Number of Occurrences:    1         Order Specific Question:    Appointment in              Answer:    One Loda, NP   [facility]   [address] [place] [address]   Office : [phone]   Fax : [phone]    Patient seen, examed and discussed with NP, agree with above history/assessment/plan. 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   ????        Dellia Cloud, M[place] [address]   Office : [phone]   Fax : [phone]

## 2017-03-03 NOTE — Progress Notes (Signed)
Discharge instructions and prescriptions provided and explained to patient, patient voiced understanding.Medication side effect sheet reviewed with pt. No home meds or valuables to return. Opportunity for questions provided. Instructed to call once ready to leave the floor.

## 2017-03-03 NOTE — Progress Notes (Addendum)
Problem: Mobility Impaired (Adult and Pediatric)  Goal: *Acute Goals and Plan of Care (Insert Text)  1.  Katelyn [name] will be independent with sit to stand and bed to chair in 3 days.  2.  Katelyn [name] will perform gait independently 500 ft in 3 days.  3.  Katelyn [name] will go up and down 10 steps with rail in 3 days.    PHYSICAL THERAPY: Daily Note, Treatment Day: 1st, AM 03/03/2017  INPATIENT: Hospital Day: 4  Payor: Theme park manager / Plan: [place] [employer]MO / Product Type: HMO /      NAME/AGE/GENDER: Katelyn [name] is a 61 y.o. female   PRIMARY DIAGNOSIS: metastatic melanoma  Aphasia  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[place]) <principal problem not specified> <principal problem not specified>        ICD-10: Treatment Diagnosis:   ?? Difficulty in walking, Not elsewhere classified (R26.2)   Precaution/Allergies:  Adhesive tape-silicones      ASSESSMENT:     Katelyn [name] is supine in bed upon contact with husband at bedside. Unable to determine if pt is confused versus expressive aphasic, but agreeable to PT treatment and cooperative. Pt transferred supine to sitting EOB independently. Pt performed STS with SBA, and ambulated 500 ft in hallway with CGA-SBA and no AD. Pt ambulated well, but perseverated on R visual field, requiring frequent verbal and manual cues to attend to L visual field and to make L turns. Pt would often attempt to walk into doors and hallways on the right, even when they were other patients' rooms, and occasionally required manual redirection. Pt returned to bedside and transferred to supine in bed independently. Discussed pt's safety awareness and benefits of HHPT with husband. Discussed discharge plans with social worker. Pt is making slow progress toward all goals, but is limited by impaired cognition and safety awareness.    This section established at most recent assessment   PROBLEM LIST (Impairments causing functional limitations):  1. Decreased Ambulation Ability/Technique  2. Decreased Balance   3. Decreased Activity Tolerance   INTERVENTIONS PLANNED: (Benefits and precautions of physical therapy have been discussed with the patient.)  1. Gait Training  2. Therapeutic Activites  3. Transfer Training     TREATMENT PLAN: Frequency/Duration: 3 times a week for duration of hospital stay  Rehabilitation Potential For Stated Goals: Good     RECOMMENDED REHABILITATION/EQUIPMENT: (at time of discharge pending progress): Due to the probability of continued deficits (see above) this patient will likely need continued skilled physical therapy after discharge.  Equipment:   ? None at this time              HISTORY:   History of Present Injury/Illness (Reason for Referral):  Katelyn [name] is a 61 y.o. female admitted on 02/28/2017 with a primary diagnosis of Aphasia, confusion and metastatic melanoma.  Per her husband and daughter, she woke this morning with aphasia, confusion and some ride sided weakness.  She has been doing very well up until today.  She has not had any fevers or recent infections.  No GI or bowel complaints.  Good energy and appetite.  She is a known patient of Dr Jenetta Loges and has been on [place] and [place] and has been taking it regularly.  She is also known to Dr Maceo Pro and has had cyberknife to her brain mets.  Husband states 2 weeks ago her decadron was decreased from 4 mg BID to 25m in AM and 2 mg in PM.  He states the  last time her steroids were reduced she had an "episode" that he states is stroke like and resolved in 36 hours [facility]) after steroids were increased.  She was taken to the [facility] today and then transferred downtown to be admitted to our service.  A CT of her head was performed and showed edema around the lesion on the left.     ??  Past Medical History/Comorbidities:   Katelyn [name]  has a past medical history of Cancer ([place]); Hypercholesterolemia; Hypertension; Nausea & vomiting; and Osteopenia. She also has no past medical history of Adverse effect of anesthesia;  Difficult intubation; Malignant hyperthermia due to anesthesia; or Pseudocholinesterase deficiency.  Katelyn [name]  has a past surgical history that includes hx colonoscopy; hx gyn; hx other surgical; and hx orthopaedic (Left, 04/30/2016).  Social History/Living Environment:   Home Environment: Private residence  One/Two Story Residence: Two story  Living Alone: No  Support Systems: Family member(s), Spouse/Significant Other/Partner  Patient Expects to be Discharged to:: Private residence  Current DME Used/Available at Home: None  Tub or Shower Type: Shower (Garden tub (prefers this over shower))  Prior Level of Function/Work/Activity:  She able to perform all gait tasks independently  Even up and down stairs.  met. Melanoma   Number of Personal Factors/Comorbidities that affect the Plan of Care: 1-2: MODERATE COMPLEXITY   EXAMINATION:   Most Recent Physical Functioning:   Gross Assessment:  AROM: Within functional limits  Strength: Generally decreased, functional  Coordination: Within functional limits               Posture:     Balance:  Sitting: Intact;Without support  Standing: Intact;Without support Bed Mobility:  Rolling: Independent  Supine to Sit: Independent  Scooting: Independent  Wheelchair Mobility:     Transfers:  Sit to Stand: Stand-by assistance  Stand to Sit: Stand-by assistance  Gait:     Base of Support: Narrowed  Speed/Cadence: Fluctuations  Gait Abnormalities: Decreased step clearance  Distance (ft): 500 Feet (ft)  Ambulation - Level of Assistance: Contact guard assistance;Stand-by assistance  Interventions: Tactile cues;Verbal cues;Safety awareness training      Body Structures Involved:  1. Muscles Body Functions Affected:  1. Movement Related Activities and Participation Affected:  1. Mobility   Number of elements that affect the Plan of Care: 3: MODERATE COMPLEXITY   CLINICAL PRESENTATION:   Presentation: Evolving clinical presentation with changing clinical characteristics: MODERATE COMPLEXITY    CLINICAL DECISION MAKING:   [place] AM-PAC??? ???6 Clicks???   Basic Mobility Inpatient Short Form  How much difficulty does the patient currently have... Unable A Lot A Little None   1.  Turning over in bed (including adjusting bedclothes, sheets and blankets)?   '[]'  1   '[]'  2   '[]'  3   '[x]'  4   2.  Sitting down on and standing up from a chair with arms ( e.g., wheelchair, bedside commode, etc.)   '[]'  1   '[]'  2   '[x]'  3   '[]'  4   3.  Moving from lying on back to sitting on the side of the bed?   '[]'  1   '[]'  2   '[]'  3   '[x]'  4   How much help from another person does the patient currently need... Total A Lot A Little None   4.  Moving to and from a bed to a chair (including a wheelchair)?   '[]'  1   '[]'  2   '[x]'  3   '[]'   4   5.  Need to walk in hospital room?   '[]'  1   '[]'  2   '[x]'  3   '[]'  4   6.  Climbing 3-5 steps with a railing?   '[]'  1   '[]'  2   '[x]'  3   '[]'  4   ?? 2007, [employer] of [employer], under license to [employer]. All rights reserved      Score:  Initial: 18 Most Recent: 20 (Date: 03/03/2017 )    Interpretation of Tool:  Represents activities that are increasingly more difficult (i.e. Bed mobility, Transfers, Gait).   Score 24 23 22-20 19-15 14-10 9-7 6     Modifier CH CI CJ CK CL CM CN      ? Mobility - Walking and Moving Around:    [phone] - CURRENT STATUS: CJ - 20%-39% impaired, limited or restricted   [id number] - GOAL STATUS: CI - 1%-19% impaired, limited or restricted   P8242 - D/C STATUS:  ---------------To be determined---------------  Payor: [employer] / Plan: [place] HMO / Product Type: HMO /      Medical Necessity:     ?? Patient is expected to demonstrate progress in functional technique to increase independence with mobility and gait..  Reason for Services/Other Comments:  ?? Patient continues to require present interventions due to patient's inability to function at baseline..   Use of outcome tool(s) and clinical judgement create a POC that gives a:  Questionable prediction of patient's progress: MODERATE COMPLEXITY            TREATMENT:   (In addition to Assessment/Re-Assessment sessions the following treatments were rendered)   Pre-treatment Symptoms/Complaints:  No complaints  Pain: Initial:   Pain Intensity 1: 0  Post Session:  0/10            Therapeutic Activity: (    10 minutes):  Therapeutic activities including Ambulation on level ground to improve mobility.  Required minimal Tactile cues;Verbal cues;Safety awareness training to promote motor control of bilateral, upper extremity(s), lower extremity(s).     Braces/Orthotics/Lines/Etc:   ?? O2 Device: Room air  Treatment/Session Assessment:    ?? Response to Treatment:  Ambulated 500 ft with CGA-SBA and no AD  ?? Interdisciplinary Collaboration:   o Physical Therapist  o Registered Nurse  o SPT  ?? After treatment position/precautions:   o Supine in bed  o Bed/Chair-wheels locked  o Bed in low position  o Caregiver at bedside  o Call light within reach  o RN notified   ?? Compliance with Program/Exercises: Will assess as treatment progresses.  ?? Recommendations/Intent for next treatment session:  "Next visit will focus on advancements to more challenging activities and reduction in assistance provided".  Total Treatment Duration:  PT Patient Time In/Time Out  Time In: 0950  Time Out: [place]

## 2017-03-03 NOTE — Progress Notes (Signed)
Patient to discharge home with [facility]. Chip liaison notified.

## 2017-03-03 NOTE — Progress Notes (Signed)
Pt requesting discharge instructions be held until husband is back in room. Will follow.

## 2017-03-03 NOTE — Discharge Summary (Addendum)
[facility] Hematology & Oncology: Inpatient Hematology / Oncology Discharge Summary Note    Patient ID:  Katelyn [name]  [id number]  61 y.o.  17-Mar-1956    Admit Date: 02/28/2017    Discharge Date: 03/03/2017    Admission Diagnoses: metastatic melanoma  Aphasia  Metastatic melanoma of brain [facility])    Discharge Diagnoses:  Principal Diagnosis: <principal problem not specified>  Active Problems:    Metastatic melanoma of brain [facility] (11/18/2016)      Aphasia (02/28/2017)        Hospital Course:  Ms. Mckenzie is a 61 y.o. female admitted on 02/28/2017 with a primary diagnosis of Aphasia, confusion and metastatic melanoma.  Per her husband and daughter, she woke the morning of admission with aphasia, confusion and some ride sided weakness.  She had been doing very well up until then. She is a known patient of Dr Jenetta Loges and has been on [place] and [place] and has been taking it regularly.  She is also known to Dr Maceo Pro and has had cyberknife to her brain mets.  Husband stated that 2 weeks ago her decadron was decreased from 4 mg BID to '4mg'$  in AM and 2 mg in PM.  He stated the last time her steroids were reduced she had an "episode" that he states was stroke like and resolved in 36 hours [facility]) after steroids were increased. A CT of her head was performed and showed edema around the lesion on the left. MRI brain-increase in size and mass effect of left periatrial tumor. Her steroids were increased to '4mg'$  q 6 hours. She was seen by Dr. Roosvelt Maser and it was felt that increased confusion was related to decrease in steroids. She has an appointment with Dr. Maceo Pro next week. Her confusion has pretty much remained the same. She is eating well and is able to get about with minimal assistance. Patient has tremendous amount of support at home and her husband would like to take her home. She will follow up with Dr. Jenetta Loges next week.     ??  Consults:  IP CONSULT TO RADIATION ONCOLOGY    Pertinent Diagnostic Studies:   Labs:     Recent Labs      03/01/17   1815   WBC  6.9   HGB  15.2   PLT  352   ANEU  5.0    Recent Labs      03/01/17   1815   NA  132*   K  4.2   CL  92*   CO2  29   GLU  237*   BUN  21   CREA  0.84   CA  8.8   SGOT  60*   AP  160*   TP  6.7   ALB  3.0*   MG  2.0           OBJECTIVE:  Patient Vitals for the past 8 hrs:   BP Temp Pulse Resp SpO2 Weight   03/03/17 1132 (!) 156/97 98.5 ??F (36.9 ??C) 88 18 95 % -   03/03/17 0717 140/87 98.7 ??F (37.1 ??C) (!) 104 18 95 % -   03/03/17 0555 - - - - - 145 lb 11.2 oz (66.1 kg)     Temp (24hrs), Avg:98.2 ??F (36.8 ??C), Min:97.4 ??F (36.3 ??C), Max:98.7 ??F (37.1 ??C)    03/14 0701 - 03/14 1900  In: 86 [P.O.:238]  Out: -     Physical Exam:  Constitutional: Well developed,  well nourished female in no acute distress, sitting comfortably in bed. Family at bedside.    HEENT: Normocephalic and atraumatic. Oropharynx is clear, mucous membranes are moist. Extraocular muscles are intact.  Sclerae anicteric.    Skin Warm and dry.  No bruising and no rash noted.  No erythema.  No pallor.    Respiratory Lungs are clear to auscultation bilaterally without wheezes, rales or rhonchi, normal air exchange without accessory muscle use.    CVS Normal rate, regular rhythm and normal S1 and S2.  No murmurs, gallops, or rubs.   Abdomen Soft, nontender and nondistended, normoactive bowel sounds.   Neuro Recognizes family members but can't recall names.   MSK Normal range of motion in general.  No edema and no tenderness.   Psych Pleasantly confused.        ASSESSMENT:    Active Problems:    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[place]) (11/18/2016)      Aphasia (02/28/2017)      Current Discharge Medication List      START taking these medications    Details   pantoprazole (PROTONIX) 40 mg tablet Take 1 Tab by mouth daily.  Qty: 30 Tab, Refills: 0         CONTINUE these medications which have CHANGED    Details   dexamethasone (DECADRON) 4 mg tablet Take 1 Tab by mouth every six (6) hours for 30 days.   Qty: 120 Tab, Refills: 0         CONTINUE these medications which have NOT CHANGED    Details   metoprolol tartrate (LOPRESSOR) 25 mg tablet Take 12.5 mg by mouth.      losartan (COZAAR) 100 mg tablet Take 100 mg by mouth.      levETIRAcetam (KEPPRA) 500 mg tablet Take 1 Tab by mouth two (2) times a day.  Qty: 60 Tab, Refills: 11      dabrafenib (TAFINLAR) 75 mg cap Take 2 Caps by mouth two (2) times a day.  Qty: 120 Cap, Refills: 3      trametinib (MEKINIST) 2 mg tab Take 1 Tab by mouth daily.  Qty: 30 Tab, Refills: 3      simvastatin (ZOCOR) 20 mg tablet Take 1 Tab by mouth nightly.  Qty: 90 Tab, Refills: 3    Associated Diagnoses: Hyperlipidemia, unspecified hyperlipidemia type      amLODIPine (NORVASC) 10 mg tablet Take 1 Tab by mouth nightly.  Qty: 90 Tab, Refills: 3    Associated Diagnoses: Essential hypertension      ferrous sulfate (IRON) 325 mg (65 mg iron) tablet Take  by mouth Daily (before breakfast).      fluticasone (FLONASE) 50 mcg/actuation nasal spray 2 Sprays by Both Nostrils route nightly.      calcium-cholecalciferol, d3, (CALCIUM 600 + D) 600-125 mg-unit tab Take  by mouth three (3) times daily.      cetirizine (ZYRTEC) 10 mg tablet Take  by mouth.      traMADol (ULTRAM) 50 mg tablet Take 1 Tab by mouth every six (6) hours as needed for Pain. Max Daily Amount: 200 mg. Indications: Pain  Qty: 60 Tab, Refills: 0    Associated Diagnoses: Low back pain, unspecified back pain laterality, unspecified chronicity, with sciatica presence unspecified      acetaminophen (TYLENOL) 325 mg tablet Take 650 mg by mouth.      HYDROcodone-acetaminophen (NORCO) 7.5-325 mg per tablet Take 1 Tab by mouth every four (4) hours as needed. Max Daily Amount:  6 Tabs.  Qty: 20 Tab, Refills: 0      LORazepam (ATIVAN) 1 mg tablet Take 1 Tab by mouth nightly as needed (insomnia). Max Daily Amount: 1 mg.  Qty: 30 Tab, Refills: 0             DISPOSITION:  Follow-up Appointments   Procedures    ??? FOLLOW UP VISIT Appointment in: One Week Please make one week follow up with Dr.Dyar     Please make one week follow up with Dr.Dyar     Standing Status:   Standing     Number of Occurrences:   1     Order Specific Question:   Appointment in     Answer:   [place], NP  [facility] Hematology & Oncology  [address] [place] [address]  Office : [phone]  Fax : [phone]   Patient seen, examed and discussed with NP, agree with above history/assessment/plan. 61 y.o.female??met melanoma admitted for confusin and aphasia, MRI showed disease progression of left periatrial tumor, received high dose decadron with clinical improvement, consult rad onc and considered pseudoprogression, increased steroid and husband felt pt improved daily, desired to go home with PT, arranged as above.  ????    Dellia Cloud, M[place] [address]  Office : [phone]  Fax : [phone]

## 2017-03-03 NOTE — Progress Notes (Deleted)
[facility] Hematology & Oncology        Inpatient Hematology / Oncology Progress Note      Admission Date: 02/28/2017 12:49 PM  Reason for Admission/Hospital Course: metastatic melanoma  Aphasia  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[place])      24 Hour Events:  Ongoing confusion  Recognizes family  Working with PT  Family at bedside  FU one week with Dr. Maceo Pro      ROS: unable to assess      10 point review of systems is otherwise negative with the exception of the elements mentioned above in the HPI.     Allergies   Allergen Reactions   ??? Adhesive Tape-Silicones Rash       OBJECTIVE:  Patient Vitals for the past 8 hrs:   BP Temp Pulse Resp SpO2 Weight   03/03/17 1132 (!) 156/97 98.5 ??F (36.9 ??C) 88 18 95 % -   03/03/17 0717 140/87 98.7 ??F (37.1 ??C) (!) 104 18 95 % -   03/03/17 0555 - - - - - 145 lb 11.2 oz (66.1 kg)     Temp (24hrs), Avg:98.2 ??F (36.8 ??C), Min:97.4 ??F (36.3 ??C), Max:98.7 ??F (37.1 ??C)    03/14 0701 - 03/14 1900  In: 65 [P.O.:238]  Out: -     Physical Exam:  Constitutional: Well developed, well nourished female in no acute distress, sitting comfortably in hospital bed. Family at bedside.    HEENT: Normocephalic and atraumatic. Oropharynx is clear, mucous membranes are moist. Extraocular muscles are intact.  Sclerae anicteric.    Skin Warm and dry.  No bruising and no rash noted.  No erythema.  No pallor.    Respiratory Lungs are clear to auscultation bilaterally without wheezes, rales or rhonchi, normal air exchange without accessory muscle use.    CVS Normal rate, regular rhythm and normal S1 and S2.  No murmurs, gallops, or rubs.   Abdomen Soft, nontender and nondistended, normoactive bowel sounds.    Neuro Recognizes family members.    MSK Normal range of motion in general.  No edema and no tenderness.   Psych Pleasantly confused.         Labs:      Recent Labs      03/01/17   1815   WBC  6.9   RBC  4.93   HGB  15.2   HCT  44.5   MCV  90.3   MCH  30.8   MCHC  34.2   RDW  16.2*   PLT  352   GRANS  73    LYMPH  22   MONOS  5   EOS  0*   BASOS  0   IG  0   DF  AUTOMATED   ANEU  5.0   ABL  1.5   ABM  0.4   ABE  0.0   ABB  0.0   AIG  0.0        Recent Labs      03/01/17   1815   NA  132*   K  4.2   CL  92*   CO2  29   AGAP  11   GLU  237*   BUN  21   CREA  0.84   GFRAA  >60   GFRNA  >60   CA  8.8   SGOT  60*   AP  160*   TP  6.7   ALB  3.0*   GLOB  3.7*  AGRAT  0.8*   MG  2.0         Imaging:      ASSESSMENT:    Problem List  Date Reviewed: 03-11-17          Codes Class Noted    Aphasia ICD-10-CM: R47.01  ICD-9-CM: 784.3  02/28/2017        Memory difficulties ICD-10-CM: R41.3  ICD-9-CM: 780.93  11/20/2016        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[place]) ICD-10-CM: I61.9  ICD-9-CM: 324  11/19/2016        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[place]) ICD-10-CM: G40.109  ICD-9-CM: 345.50  11/19/2016        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[place]) ICD-10-CM: C79.31  ICD-9-CM: 198.3  11/18/2016        Liver lesion ICD-10-CM: K76.9  ICD-9-CM: 573.8  10/16/2016        Osteopenia ICD-10-CM: M85.80  ICD-9-CM: 733.90  08/18/2013        HTN (hypertension) (Chronic) ICD-10-CM: I10  ICD-9-CM: 401.9  08/18/2013        Hyperlipidemia ICD-10-CM: E78.5  ICD-9-CM: 272.4  08/18/2013                PLAN:    Metastatic Melanoma to Brain  - currently on [employer] and [employer] (since 11/2016), Dex 4 mg AM, 2 mg PM  -continue [place] and [place]ist  -sp cyberknife  3/12 MRI brain-increase in size and mass effect of left periatrial tumor  3/13 FU appointment with Dr. Maceo Pro scheduled next week  ??  Aphasia/Confusion/Brain lesion edema  - increase Dex to 40 mg Q6h, MRI brain, consult RadOnc for input  3/12 appointment today with Dr. Roosvelt Maser. Ongoing confusion. Family feels that she is improved with steroids increased.   3/13 Dr.Kilburn feels that increased confusion related to decrease in steroids. FU Dr. Maceo Pro next week.     Planning to discharge once referrals for home health have been approved. Hopefully Dr. Unk Lightning will see patient today.   ??   Supportive Care  Continue home meds  Lovenox for DVT Prophylaxis  Follow [place] SOPs              Lannie Fields, NP   [facility] Hematology & Oncology  [address] [place] [address]  Office : [phone]  Fax : [phone]

## 2017-03-03 NOTE — Progress Notes (Signed)
Bedside shift change report given to Annamaria Helling, RN (oncoming nurse) by Delaine Lame, RN (offgoing nurse). Report included the following information SBAR, Kardex, Intake/Output, MAR, Recent Results and Med Rec Status.

## 2017-03-04 ENCOUNTER — Encounter: Admit: 2017-03-04 | Discharge: 2017-03-04 | Payer: PRIVATE HEALTH INSURANCE | Primary: Family Medicine

## 2017-03-04 NOTE — Progress Notes (Signed)
Verbal order given to home health PT for speech therapy consult for patient.

## 2017-03-04 NOTE — Progress Notes (Signed)
Transition of Care Discharge Follow-up Questionnaire   Date/Time of Call:   March 04, 2017 2:53PM   What was the patient hospitalized for? Patient hospitalized for Metastatic melanoma of brain.             Does the patient understand his/her diagnosis and/or treatment and what happened during the hospitalization?     Patient's spouse states understanding of patient diagnosis and treatment plan during hospitalization.   Did the patient receive discharge instructions? Yes     Review any discharge instructions (see notes in [facility]).  Ask patient if they understand these.  Do they have any questions?     Patient's spouse states understanding of patient discharge instructions, patient's spouse states no questions.   Were home services ordered (nursing, PT, OT, ST, etc.)? Yes, home health services ordered (PT).         If so, has the first visit occurred? If not, why? (Assist with coordination of services if necessary.) Yes         Was any DME ordered? No durable medical equipment ordered.     If so, has it been received?  If not, why?  (Assist with coordination of arranging DME orders if necessary.) NA         Complete a review of all medications (new, continued and discontinued meds per the D/C instructions and medication tab in [facility]). Care Coordinator reviewed all medications with patient's spouse per connect care.    START taking these medications 03/03/2017   ?? Details   pantoprazole (PROTONIX) 40 mg tablet Take 1 Tab by mouth daily.  Qty: 30 Tab, Refills: 0   ??   ??        CONTINUE these medications which have CHANGED 03/03/2017   ?? Details   dexamethasone (DECADRON) 4 mg tablet Take 1 Tab by mouth every six (6) hours for 30 days.  Qty: 120 Tab, Refills: 0   ??              Were all new prescriptions filled?  If not, why?  (Assist with obtainment of medications if necessary.) Yes     Does the patient understand the purpose and dosing instructions for all  medications?  (If patient has questions, provide explanation and education.) Patient's spouse states understanding of patient current medications and dosing instructions. Care Coordinator educated patient's spouse on the importance of ensuring medication compliance and reporting medication side effects to patient PCP/Specialist.      Does the patient have any problems in performing ADL???s? (If patient is unable to perform ADLs ??? what is the limiting factor(s)?  Do they have a support system that can assist? If no support system is present, discuss possible assistance that they may be able to obtain.) Patient's spouse states patient is independent with ADL's and ambulation. Patient's spouse states patient is doing well and is happy to be at home. Patient's spouse states home health physical therapy out today, patient's spouse states he was informed that due to patient being unable to follow commands patient may not be a good candidate for physical therapy. Patient's spouse states he and other family members will continue to provide care and assistance to patient as needed.             Does the patient have all follow-up appointments scheduled?      7 day f/up with PCP?    7-14 day f/up with specialist?    If f/up has not been made ??? what  actions has the care coordinator made to accomplish this?    Has transportation been arranged?    [facility] follow-up should be within 7 days of discharge; all others should have PCP follow-up within 7 days of discharge; follow-ups with other specialists should be within 7-14 days of discharge.) Care Coordinator educated patient's spouse on the importance of scheduling follow up appointment with  patient PCP within 7 to 14 days of hospital discharge. Patient's spouse states patient has an existing appointment with patient PCP. Patient's spouse declined to move up patient's appointment with PCP.      03/12/2017 7:35 AM [facility]      03/12/2017 8:00 AM Greer Ee, MD [facility]     Patient's spouse states he will accompany patient and provide transportation to all appointments.     Any other questions or concerns expressed by the patient?     No further needs identified, patient's spouse instructed to call Care Coordinator if further questions or concerns arise.     Schedule next appointment with [facility] Coordinator or refer to RN Case Manager/Social Services Case Manager per the workflow guidelines.    When is care coordinator???s next follow-up call scheduled?    If referred for CCM ??? what RN care manager was the referral assigned?   NA        NA      NA           TOC Call Completed By: Vivien Presto, LPN  [facility]  Care Coordinator     This note will not be viewable in [place].

## 2017-03-08 ENCOUNTER — Encounter: Payer: PRIVATE HEALTH INSURANCE | Primary: Family Medicine

## 2017-03-08 ENCOUNTER — Encounter: Primary: Family Medicine

## 2017-03-09 ENCOUNTER — Inpatient Hospital Stay: Admit: 2017-03-09 | Payer: PRIVATE HEALTH INSURANCE | Attending: Radiation Oncology | Primary: Family Medicine

## 2017-03-09 ENCOUNTER — Encounter

## 2017-03-09 ENCOUNTER — Encounter: Payer: PRIVATE HEALTH INSURANCE | Primary: Family Medicine

## 2017-03-09 ENCOUNTER — Encounter: Primary: Family Medicine

## 2017-03-09 NOTE — Progress Notes (Signed)
F/u brain mets, post hospital.  Decadron 4mg  every 6 hrs.  F/u Dr. Jenetta Loges 03-12-17.  Palliative care f/u 03-31-17.  Pt is alert, oriented. Answered questions appropriately. Walking with assist.    Alain Honey, RN

## 2017-03-09 NOTE — Progress Notes (Signed)
Patient: Katelyn [name] MRN: [id number]  SSN: [id number]    Date of Birth: Apr 07, 1956  Age: 61 y.o.  Sex: female      Other Providers:  Smith Robert MD, Mellody Dance MD (primary radiation oncologist)    CHIEF COMPLAINT: Cognitive decline    DIAGNOSIS: Metastatic melanoma with brain metastases, progression vs treatment response with new symptoms    PREVIOUS TREATMENT:  1) 11/19/16 through 11/25/16:  CyberKnife SRS delivering 3000 cGy in 5 fractions of 600 cGy each to 2 large brain lesions and one adjacent small metastasis. She received 2000-2400 cGy to each of 3 smaller lesions using single fraction SRS. The left retrobulbar bulbar metastasis received 2500 cGy in 5 fractions of 500 cGy each.   2) 12/01/16:  Rosezella [place] and Mekinist    HISTORY OF PRESENT ILLNESS:  Katelyn [name] is a 61 y.o. female who I initially saw in 11/17 for treatment of multiple brain metastases.  She has a history of osteopenia, hypertension, hypercholesterolemia, and melanoma of the back s/p excision in 2008. She initially presented to her PCP on 10/14/16 reporting a one month history of intermittent upper abdominal pain with loss of appetite and right scapular pain radiating to her right arm. Ultrasound of the abdomen, obtained on 10/15/16, identified multiple lesions in the liver, concerning for metastatic disease, as well as small hypoechoic areas near the tail of the pancreas, possibly adenopathy. Further imaging was performed with CT scan of the chest, abdomen, pelvis on 10/15/16 showing hepatic and pulmonary lesions highly concerning for metastatic disease. Lung nodules measured up to 12 mm and liver lesions measured up to 25 mm. She was referred to Dr. Jenetta Loges for urgent oncologic consultation for evaluation and management options. He recommended biopsy of a liver lesion and PET/CT for staging. PET/CT scan on 10/29/16 showed small subcutaneous lesions in the left posterior back and right anterior abdominal wall suspicious for recurrent melanoma.  Abnormal FDG activity was also seen to be associated with hepatic and pulmonary lesions. Abnormal activity was seen in mediastinal lymph nodes. In addition, hyperdense abnormal masses were seen in the brain which demonstrated mild activity raising concern for intracranial metastases. MRI of the brain on 10/30/16 showed multiple metastatic masses throughout the bilateral cerebral hemispheres and in the right cerebellum associated with heterogeneous hyperenhancement, measuring up to 1.7 x 2.0 centimeters in the right cerebellar hemisphere and up to 2.7 x 2.7 cm in the left parietal lobe. These were felt likely to be hemorrhagic and were associated with mild surrounding vasogenic edema without significant midline shift. In addition, a heterogeneously enhancing mass was seen in the left retro-orbital fat displacing the optic nerve measuring 1.7 x 2.0 cm consistent with intraorbital retro-bulbar metastasis. The MRI was reviewed by neurosurgeon Dr. Raul Del who recommended against neurosurgical intervention.  She then received fractionated stereotactic treatment which she completed November 25, 2016.    During her initial treatment, she began having seizure-like activity in the left lower extremity. Over the next several hours this progressed. She was admitted to [facility] and placed in the ICU. She was initiated on Keppra and had good seizure control but continued to have some left lower extremity weakness and numbness.  She was seen at [facility] on 11/27/16. Recommendation was same as Dr. Jenetta Loges - begin Taflinar and Mekinist. This was initiated under the direction of Dr. Jenetta Loges on 12/01/16.      On 01/06/17 she developed an acute onset aphasia and confusion. She was seen at the ER at [facility] CT of the head demonstrated multiple masses with surrounding edema and possibly areas of hemorrhage. She was given 10 mg of IV Decadron and transferred to [facility]. She was placed on IV Decadron 4 mg every 6 hours with significant improvement in her symptoms. At the time of discharge on 01/09/17 she was able to communicate smoothly and respond appropriately.  MRI of the brain on 01/07/17 during her hospitalization showed multiple intracranial lesions several of which contain components of T1 hyperintensity which could represent hemorrhage and or melanin. The largest of these was in the left temporoparietal region. The appearance was most suspicious for melanoma metastasis. Unfortunately, this MRI was not compared to her prior MRI at [facility].  I saw her again on 01/14/2017 and after reviewing the findings and considering she had improved on dexamethasone, made no other changes.    Unfortunately she acutely developed similar symptoms and again on 02/28/17. I communicated with her husband that day and recommended that she be evaluated at the ED. She presented to the [facility] emergency room and had repeat imaging including an MRI which again showed mixed findings.  As a result of the MRI and her acute issues, we are asked to consult her for possible management and as a result was seen by my partner Dr. Roosvelt Maser on 03/01/17.     Since her discharge from the hospital on 03/04/17 she has had minimal improvement.  She continues on dexamethasone 4 mg every 6 hours.  She has been having significant trouble with word finding, confusion, following commands, and gait instability. She denies pain.  She is frightened and emotional.    PAST MEDICAL HISTORY:    Past Medical History:   Diagnosis Date   ??? Cancer ([place])     melanoma - back   ??? Hypercholesterolemia    ??? Hypertension     managed with meds   ??? Nausea & vomiting    ??? Osteopenia      PAST SURGICAL HISTORY:   Past Surgical History:   Procedure Laterality Date   ??? HX COLONOSCOPY     ??? HX GYN      c-section x 5   ??? HX ORTHOPAEDIC Left 04/30/2016    knee   ??? HX OTHER SURGICAL      melanoma removed from back        MEDICATIONS:     Current Outpatient Prescriptions:   ???  dexamethasone (DECADRON) 4 mg tablet, Take 1 Tab by mouth every six (6) hours for 30 days., Disp: 120 Tab, Rfl: 0  ???  pantoprazole (PROTONIX) 40 mg tablet, Take 1 Tab by mouth daily., Disp: 30 Tab, Rfl: 0  ???  metoprolol tartrate (LOPRESSOR) 25 mg tablet, Take 12.5 mg by mouth., Disp: , Rfl:   ???  traMADol (ULTRAM) 50 mg tablet, Take 1 Tab by mouth every six (6) hours as needed for Pain. Max Daily Amount: 200 mg. Indications: Pain, Disp: 60 Tab, Rfl: 0  ???  losartan (COZAAR) 100 mg tablet, Take 100 mg by mouth., Disp: , Rfl:   ???  acetaminophen (TYLENOL) 325 mg tablet, Take 650 mg by mouth., Disp: , Rfl:   ???  HYDROcodone-acetaminophen (NORCO) 7.5-325 mg per tablet, Take 1 Tab by mouth every four (4) hours as needed. Max Daily Amount: 6 Tabs., Disp: 20 Tab, Rfl: 0  ???  levETIRAcetam (KEPPRA) 500 mg tablet, Take 1 Tab by mouth two (2) times a day., Disp: 60 Tab, Rfl:  11  ???  LORazepam (ATIVAN) 1 mg tablet, Take 1 Tab by mouth nightly as needed (insomnia). Max Daily Amount: 1 mg., Disp: 30 Tab, Rfl: 0  ???  dabrafenib (TAFINLAR) 75 mg cap, Take 2 Caps by mouth two (2) times a day., Disp: 120 Cap, Rfl: 3  ???  trametinib (MEKINIST) 2 mg tab, Take 1 Tab by mouth daily., Disp: 30 Tab, Rfl: 3  ???  simvastatin (ZOCOR) 20 mg tablet, Take 1 Tab by mouth nightly., Disp: 90 Tab, Rfl: 3  ???  amLODIPine (NORVASC) 10 mg tablet, Take 1 Tab by mouth nightly., Disp: 90 Tab, Rfl: 3  ???  ferrous sulfate (IRON) 325 mg (65 mg iron) tablet, Take  by mouth Daily (before breakfast)., Disp: , Rfl:   ???  fluticasone (FLONASE) 50 mcg/actuation nasal spray, 2 Sprays by Both Nostrils route nightly., Disp: , Rfl:   ???  calcium-cholecalciferol, d3, (CALCIUM 600 + D) 600-125 mg-unit tab, Take  by mouth three (3) times daily., Disp: , Rfl:   ???  cetirizine (ZYRTEC) 10 mg tablet, Take  by mouth., Disp: , Rfl:     ALLERGIES:   Allergies   Allergen Reactions   ??? Adhesive Tape-Silicones Rash        SOCIAL HISTORY:   Social History     Social History   ??? Marital status: MARRIED     Spouse name: N/A   ??? Number of children: N/A   ??? Years of education: N/A     Occupational History   ??? Not on file.     Social History Main Topics   ??? Smoking status: Never Smoker   ??? Smokeless tobacco: Never Used   ??? Alcohol use No   ??? Drug use: Not on file   ??? Sexual activity: Not on file     Other Topics Concern   ??? Not on file     Social History Narrative       FAMILY HISTORY:   Family History   Problem Relation Age of Onset   ??? Hypertension Mother    ??? Elevated Lipids Mother    ??? Elevated Lipids Father    ??? Hypertension Father    ??? Arthritis-osteo Father      spine   ??? Heart Disease Father    ??? Breast Cancer Other    ??? Diabetes Sister        REVIEW OF SYSTEMS: Review of systems was completed through discussion with family including a 12 point review, yet the patient was not able to fully converse.     PHYSICAL EXAMINATION:   ECOG Performance status 3  VITAL SIGNS:   There were no vitals taken for this visit.     GENERAL: The patient is well-developed, and in no acute distress. She is able to communicate, but has occasional word-finding difficulty. She is intermittently tearful.     PATHOLOGY:    See HPI    LABORATORY:   Lab Results   Component Value Date/Time    Sodium 132 (L) 03/01/2017 06:15 PM    Potassium 4.2 03/01/2017 06:15 PM    Chloride 92 (L) 03/01/2017 06:15 PM    CO2 29 03/01/2017 06:15 PM    Anion gap 11 03/01/2017 06:15 PM    Glucose 237 (H) 03/01/2017 06:15 PM    BUN 21 03/01/2017 06:15 PM    Creatinine 0.84 03/01/2017 06:15 PM    GFR est AA >60 03/01/2017 06:15 PM    GFR est non-AA >60 03/01/2017  06:15 PM    Calcium 8.8 03/01/2017 06:15 PM    Magnesium 2.0 03/01/2017 06:15 PM    Albumin 3.0 (L) 03/01/2017 06:15 PM    Protein, total 6.7 03/01/2017 06:15 PM    Globulin 3.7 (H) 03/01/2017 06:15 PM    A-G Ratio 0.8 (L) 03/01/2017 06:15 PM    AST (SGOT) 60 (H) 03/01/2017 06:15 PM     ALT (SGPT) 125 (H) 03/01/2017 06:15 PM     Lab Results   Component Value Date/Time    WBC 6.9 03/01/2017 06:15 PM    HGB 15.2 03/01/2017 06:15 PM    HCT 44.5 03/01/2017 06:15 PM    PLATELET 352 03/01/2017 06:15 PM       RADIOLOGY:    Mri Brain W Wo Cont    Result Date: 02/28/2017  MRI of the Brain with contrast: 02/28/2017 History: Melanoma, right-sided weakness Sequences: Axial pre and post contrast T1, FLAIR, T2, diffusion, coronal post contrast T1 and sagittal precontrast T1-weighted images of the brain. Comparison: MRI the brain 11/20/2016 13 cc of IV MultiHance   was injected to aid in the detection of intracranial pathology. Findings: There are no restricted diffusion abnormalities. The ventricles and basilar vascular flow voids are unremarkable. The pituitary and sinuses are unremarkable. There are multiple enhancing infratentorial and supratentorial lesions containing hemosiderin/melanin and restricted diffusion. The lesion in the right cerebellar hemisphere currently measures 20 mm x 27 mm as compared to the 32 mm x 25 mm on the last examination. A lesion in the left periatrial white matter formerly measured 25 mm x 32 mm and currently measures 34 mm x 37 mm. There is interval increase in degree of peritumoral edema and mass effect upon the atrium of the left lateral ventricle. A lesion in right parietal white matter which formerly measured 13.5 mm measures 19 mm. A lesion in the superior right occipital white matter which formerly measured 16 mm now measures 21 mm. There are no new lesions. The 7th and 8th nerves and there orbits are unremarkable. There are scattered focal and confluent areas of T2 prolongation within the subcortical, periventricular, and pontine white matter.     IMPRESSION: Stable pontine and supratentorial microischemia, interval increase and decrease in some of the intracranial lesions as above with the most pronounced change being the increase in size and mass effect of  the left periatrial tumor.    Ct Head Wo Cont    Result Date: 02/28/2017  Noncontrast head CT Clinical Indication: Acute confusion and dysarthria with history of melanoma and hemorrhagic brain metastases. Technique: Noncontrast axial images were obtained through the brain. Comparison: MRI 11/20/2016, CT 11/19/2016, PET CT 02/18/2017 Findings: Again demonstrated are multiple intracranial hyperdense and/or hemorrhagic metastasis system with the patient's known melanoma. This has been further evaluated on multiple previous imaging studies. The number and extent of lesions appear very similar to the recent PET/CT. There is a large amount of edema surrounding the largest mass which is in the left frontoparietal cranium. There is 4 mm of rightward midline shift which has increased from the prior MRI of 11/20/2016. The basilar cisterns are slightly effaced. No definite new hemorrhage. No hydrocephalus or developing extra-axial fluid. The bones are unchanged. No evidence of skull fracture. The mastoids and paranasal sinuses appear aerated.     Impression: 1. Multiple intracranial metastases very similar to recent PET/CT. 2. Mass effect, and mild midline shift.     Pet/ct Tumor Image Wh Bdy W (sub)    Result Date: 02/18/2017  NUCLEAR MEDICINE WHOLE BODY CT / PET- FDG Study. INDICATION: Melanoma, metastatic to brain. COMPARISONS: Noe November 2017. TECHNIQUE:   Radiopharmaceutical: 17.3 mCi F18-FDG IV.  This is a whole-body PET- FDG study performed on a dedicated full- ring scanner. Low dose, nondiagnostic CT axial source images are also acquired for PET attenuation correction and are utilized for PET-CT fusion.  The patient underwent adequate fasting of at least 4 hours. Blood glucose at the time of tracer administration is 124 mg/dl, which is adequate for imaging. Approximately 90 minutes after administration of the radiopharmaceutical, imaging was initiated covering the skull to the thighs. Additional high-resolution  thin section images of the lower extremities were performed. FINDINGS:  HEAD AND NECK: No abnormal hypermetabolic activity. Normal physiologic activity in the brain and salivary glands. Hyperattenuating lesions in the brain have enlarged. No mass effect or herniation. CHEST: Atelectasis right lower lobe. No hypermetabolic pulmonary nodules or adenopathy. Previously described hypermetabolic activity has resolved. ABDOMEN: Hypermetabolic activity of the liver lesions has resolved. Low-attenuation liver lesions are identified along the CT scan. No new abnormal areas of hypermetabolic activity. There is normal physiologic activity GI tract and GU tract. PELVIS: Normal physiologic activity in the urinary bladder. No hypermetabolic adenopathy or bony lesions. LOWER EXTREMITIES: No abnormal hypermetabolic activity in the lower extremities.     IMPRESSION: 1) Hyperattenuating brain lesions have enlarged since prior exam. No mass effect or herniation. 2) Resolution of the hypermetabolic activity of the liver lesions and an of the lesions in the chest. No abnormal hypermetabolic activity appreciated on today's exam.       IMPRESSION:  PATSYE SULLIVANT is a 61 y.o. female with metastatic melanoma to the brain.  Unfortunately she had multiple large lesions present at diagnosis but was able to completed treatment with fractionated stereotactic radiation.  Reviewing the MRI, while appears nearly all the lesions have somewhat increased in size, considering the response rate normally is between 80-90%, there is simply statistically a very small chance these are all progressing.  It is far more likely each are related to treatment related response and edema.  This is particularly true with the other imaging characteristics including loss of peripheral enhancement with many of the lesions having more necrotic centers.  For that reason, after speaking with her primary radiation oncologist and running the case  again by neurosurgery, Dr. Raul Del, we did feel these were all treatment related.  We agreed with continued steroid.     I have again had a lengthy conversation with her husband and daughters about prognosis and outlining the anticipated course from here, which is also quite unpredictable.  We are hopeful that her acute symptoms will improve on the steroid and that she will have a favorable long-term systemic and intracranial response but that it is too early to make accurate predictions.      We again discussed the possibility that neurosurgical intervention with either a laser ablative therapy, or craniotomy might be necessary if her local CNS symptoms do not improve and that this would come with a risk of substantial morbidity. They would like a consultation with Dr. Raul Del.     They had already agreed with Dr. Roosvelt Maser to repeat MRI in about 3 weeks.  They have also initiated occupational/physical therapy so that she and her family get appropriate and accurate advice on how to test function at home without injury.      PLAN:   1. I will arrange for her to see Dr. Raul Del for neurosurgical  consultation  2. We will consider sender her to [facility] to discuss auto-LITT if Dr. Raul Del decides against a surgical approach  3. I will not arrange a routine follow-up with me, but will be available on an as-needed basis  4. Follow with [facility] [place] regarding psychosocial issues        Portions of this note were copied from prior encounters and reviewed for accuracy, currency, and represent documentation and tasks completed during this encounter.  I verify and attest these portions to be unchanged from prior visits.    Milta Deiters, MD  03/09/17

## 2017-03-09 NOTE — Progress Notes (Signed)
SW received referral from RN Dyann Ruddle to assess pt and family psychosocial needs. SW met with pt, pt's husband, and pt's 3 adult daughters after MD appointment. Pt stated she feels nervous and verbalized frustration at inability to articulate herself on a regular basis. SW provided psychosocial support and normalized pt's experience and dismay. SW validated pt's concerns and actively listened.    Pt's family asked about support for themselves and pt. SW provided education about [place] supportive services and also offered referral to counseling and additional resources provided by [employer] [facility] [place]. Pt and family verbalized agreement to [employer] referral and frustration about not being told about resources at beginning of pt's tx, stating they have had to "advocate for ourselves." SW actively listened and validated their concerns, emphasizing SW's role on care team and ability to address some of these concerns. They verbalized appreciation. SW completed and faxed referral to [phone].    SW encouraged family to explore resource room in [employer] for additional educational materials and provided handbook for caregiving. They verbalized appreciation. SW offered to follow up with additional resources via email, per husband request. They verbalized agreement. SW emailed husband resources regarding [employer] and additional resources. No other immediate needs identified at this time. SW provided SW contact information and encouraged pt and family to call should any other needs arise. They verbalized understanding. SW intends to follow up.    This note will not be viewable in [facility].

## 2017-03-10 ENCOUNTER — Encounter: Admit: 2017-03-10 | Discharge: 2017-03-10 | Payer: PRIVATE HEALTH INSURANCE | Primary: Family Medicine

## 2017-03-12 ENCOUNTER — Inpatient Hospital Stay: Admit: 2017-03-12 | Payer: PRIVATE HEALTH INSURANCE | Attending: Internal Medicine | Primary: Family Medicine

## 2017-03-12 ENCOUNTER — Inpatient Hospital Stay: Admit: 2017-03-12 | Payer: PRIVATE HEALTH INSURANCE | Primary: Family Medicine

## 2017-03-12 ENCOUNTER — Ambulatory Visit
Admit: 2017-03-12 | Discharge: 2017-03-12 | Payer: PRIVATE HEALTH INSURANCE | Attending: Internal Medicine | Primary: Family Medicine

## 2017-03-12 DIAGNOSIS — C7931 Secondary malignant neoplasm of brain: Secondary | ICD-10-CM

## 2017-03-12 LAB — METABOLIC PANEL, COMPREHENSIVE
A-G Ratio: 0.6 — ABNORMAL LOW (ref 1.2–3.5)
ALT (SGPT): 39 U/L (ref 12–65)
AST (SGOT): 40 U/L — ABNORMAL HIGH (ref 15–37)
Albumin: 2.7 g/dL — ABNORMAL LOW (ref 3.2–4.6)
Alk. phosphatase: 105 U/L (ref 50–136)
Anion gap: 10 mmol/L (ref 7–16)
BUN: 16 MG/DL (ref 8–23)
Bilirubin, total: 0.5 MG/DL (ref 0.2–1.1)
CO2: 28 mmol/L (ref 21–32)
Calcium: 8.7 MG/DL (ref 8.3–10.4)
Chloride: 94 mmol/L — ABNORMAL LOW (ref 98–107)
Creatinine: 0.88 MG/DL (ref 0.6–1.0)
GFR est AA: >60 mL/min/{1.73_m2} (ref >60)
GFR est non-AA: >60 mL/min/{1.73_m2} (ref >60)
Globulin: 4.4 g/dL — ABNORMAL HIGH (ref 2.3–3.5)
Glucose: 129 mg/dL — ABNORMAL HIGH (ref 65–100)
Potassium: 3.1 mmol/L — ABNORMAL LOW (ref 3.5–5.1)
Protein, total: 7.1 g/dL (ref 6.3–8.2)
Sodium: 132 mmol/L — ABNORMAL LOW (ref 136–145)

## 2017-03-12 LAB — CBC WITH AUTOMATED DIFF
ABS. BASOPHILS: 0 10*3/uL (ref 0.0–0.2)
ABS. EOSINOPHILS: 0 10*3/uL (ref 0.0–0.8)
ABS. LYMPHOCYTES: 1.5 10*3/uL (ref 0.5–4.6)
ABS. MONOCYTES: 0.5 10*3/uL (ref 0.1–1.3)
ABS. NEUTROPHILS: 4.8 10*3/uL (ref 1.7–8.2)
ABSOLUTE NRBC: 0.01 10*3/uL (ref 0.0–0.2)
BASOPHILS: 0 % (ref 0.0–2.0)
EOSINOPHILS: 0 % — ABNORMAL LOW (ref 0.5–7.8)
HCT: 42.4 % (ref 35.8–46.3)
HGB: 14.8 g/dL (ref 11.7–15.4)
LYMPHOCYTES: 22 % (ref 13–44)
MCH: 30.5 PG (ref 26.1–32.9)
MCHC: 34.9 g/dL (ref 31.4–35.0)
MCV: 87.2 FL (ref 79.6–97.8)
MONOCYTES: 7 % (ref 4.0–12.0)
MPV: 8.4 FL — ABNORMAL LOW (ref 10.8–14.1)
NEUTROPHILS: 71 % (ref 43–78)
PLATELET: 268 10*3/uL (ref 150–450)
RBC: 4.86 M/uL (ref 4.05–5.25)
RDW: 14.8 % — ABNORMAL HIGH (ref 11.9–14.6)
WBC: 6.8 10*3/uL (ref 4.3–11.1)

## 2017-03-12 LAB — LD: LD: 377 U/L — ABNORMAL HIGH (ref 100–190)

## 2017-03-12 NOTE — Progress Notes (Signed)
Patient daughter calling, states that the appointment for Dr. Raul Del on 4/10.

## 2017-03-12 NOTE — Progress Notes (Signed)
Left VM for pts husband that Chest CT was clear.  Pt or husband will call with any further symptoms or concerns.

## 2017-03-12 NOTE — Progress Notes (Signed)
SW followed up with pt, pt's daughter and pt's husband at MD appointment. SW completed visit with pt, pt's family, MD and RN Angelia Mould. See MD note. SW then met with pt and family to answer any remaining questions. Pt's husband confirmed he received email from [place] with resources and SW provided print out of email for pt's daughter. SW provided list of local support groups and verified that [facility] referral has been faxed. Pt's family verbalized understanding and appreciation. No other needs identified. SW encouraged pt and family to call SW should any other needs arise. They verbalized understanding. SW intends to follow up.

## 2017-03-12 NOTE — Progress Notes (Signed)
[facility]: Office Visit Established Patient    Chief Complaint:    Chief Complaint   Patient presents with   ??? Follow-up         History of Present Illness:  Katelyn [name] is a 61 y.o. 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  She has a history of osteopenia, hypertension, hypercholesterolemia, and melanoma of the back s/p excision in 2008, sentinel node was negative but she is unsure what T stage.  She initially presented to her PCP on 10/14/16 reporting a one month history of intermittent upper abdominal pain with loss of appetite and right scapular pain radiating to her right arm.  Ultrasound of the abdomen, obtained on 10/15/16, identified multiple lesions in the liver, concerning for metastatic disease, as well as small hypoechoic areas near the tail of the pancreas, possibly adenopathy.  Further imaging was performed with CT scan of the chest, this showed hepatic and pulmonary lesions highly concerning for metastatic disease.  Lung nodules measured up to 12 mm and liver lesions measured up to 25 mm.  She was referred for urgent oncologic consultation, and liver biopsy was definitive for metastatic melanoma, and PET confirmed the multiple lesions in the liver, lungs and lymph nodes.  There were also suspicious changes in the visualized portion of the brain.  We ordered a STAT MRI which confirmed multiple brain lesions, prompting [place] to initiate steroids and refer to radiation oncology.  She completed CyberKnife radiation to the brain lesions, but the day of the first CyberKnife treatment, she was noted to have significant tremor, almost to the point of suggesting some possible partial seizure activity.  She had significant left sided weakness as well.  She was admitted and started on high dose steroids, antihypertensives, and Keppra.  MRI brain confirmed the known hemorrhagic brain lesions, but Neurosurgery did not feel there  was anything surgical.  She was able to complete the course of SRS and had some neurologic recovery, with no more seizure-like activity.  Her pathology returned showing a BRAF mutation, we were trying to screen her for the [phone] trial, but with the extent of her disease and the neurologic changes, we felt that we did not have time to wait for immunotherapy effect and recommended upfront dabrafenib/trametinib.  She went to [facility] and they agreed with the plan.  We also spent a great deal of time discussing long-term prognosis, which is limited by her metastatic disease which is incurable, but I explained that with BRAF/MEK inhibitors and with immunotherapy down the line, the potential for treatment response and prolongation of life was certainly realistic.  PET/CT performed after 3 months reviewed and shows dramatic systemic response, the liver and lung lesions have essentially resolved.  Her MRI of the brain was done today and shows a mixed response, Dr. Maceo Pro feels this may be pseudoprogression, he will present to the [facility] neuro tumor board for input and discussion of possible biopsy/decompression.            Here for follow-up.  Now on dabrafenib/trametinib for about 4 months.  Since we last saw her, she developed recurrence of her aphasia and weakness.  Restarted on dex 4 mg q6h, with not much improvement.  Previously, these episodes have only lasted a couple of days, but this time the symptoms seem to be more persistent.  Dr. Maceo Pro has referred her to Dr. Raul Del to discuss potential biopsy and decompression of one of the larger  lesions to both evaluate for progression vs pseudoprogression, and to attempt palliation of some of her CNS symptoms.  She does not have any systemic symptoms, no pain, no nausea, appetite is good.  She does have a fever this morning, and they have noticed some increase in a wet cough.  No dyspnea.  Tolerating Taf/Mek well otherwise.       Review of Systems:   Constitutional: Positive for fatigue.   HENT: Negative.   Eyes: Negative.   Respiratory: Negative.   Cardiovascular: Negative.   Gastrointestinal: Negative.   Genitourinary: Negative.   Musculoskeletal: Negative.   Skin: Negative.   Neurological: Positive for confusion and weakness.   Endo/Heme/Allergies: Negative.   Psychiatric/Behavioral: Negative.   All other systems reviewed and are negative.     Allergies   Allergen Reactions   ??? Adhesive Tape-Silicones Rash     Past Medical History:   Diagnosis Date   ??? Cancer ([place])     melanoma - back   ??? Hypercholesterolemia    ??? Hypertension     managed with meds   ??? Nausea & vomiting    ??? Osteopenia      Past Surgical History:   Procedure Laterality Date   ??? HX COLONOSCOPY     ??? HX GYN      c-section x 5   ??? HX ORTHOPAEDIC Left 04/30/2016    knee   ??? HX OTHER SURGICAL      melanoma removed from back     Family History   Problem Relation Age of Onset   ??? Hypertension Mother    ??? Elevated Lipids Mother    ??? Elevated Lipids Father    ??? Hypertension Father    ??? Arthritis-osteo Father      spine   ??? Heart Disease Father    ??? Breast Cancer Other    ??? Diabetes Sister      Social History     Social History   ??? Marital status: MARRIED     Spouse name: N/A   ??? Number of children: N/A   ??? Years of education: N/A     Occupational History   ??? Not on file.     Social History Main Topics   ??? Smoking status: Never Smoker   ??? Smokeless tobacco: Never Used   ??? Alcohol use No   ??? Drug use: Not on file   ??? Sexual activity: Not on file     Other Topics Concern   ??? Not on file     Social History Narrative     Current Outpatient Prescriptions   Medication Sig Dispense Refill   ??? dexamethasone (DECADRON) 4 mg tablet Take 1 Tab by mouth every six (6) hours for 30 days. (Patient taking differently: Take 4 mg by mouth every six (6) hours. 4 Tabs every 6 hours) 120 Tab 0   ??? pantoprazole (PROTONIX) 40 mg tablet Take 1 Tab by mouth daily. 30 Tab 0    ??? metoprolol tartrate (LOPRESSOR) 25 mg tablet Take 12.5 mg by mouth.     ??? traMADol (ULTRAM) 50 mg tablet Take 1 Tab by mouth every six (6) hours as needed for Pain. Max Daily Amount: 200 mg. Indications: Pain 60 Tab 0   ??? losartan (COZAAR) 100 mg tablet Take 100 mg by mouth.     ??? acetaminophen (TYLENOL) 325 mg tablet Take 650 mg by mouth.     ??? HYDROcodone-acetaminophen (NORCO) 7.5-325 mg per tablet Take 1 Tab  by mouth every four (4) hours as needed. Max Daily Amount: 6 Tabs. 20 Tab 0   ??? levETIRAcetam (KEPPRA) 500 mg tablet Take 1 Tab by mouth two (2) times a day. 60 Tab 11   ??? LORazepam (ATIVAN) 1 mg tablet Take 1 Tab by mouth nightly as needed (insomnia). Max Daily Amount: 1 mg. 30 Tab 0   ??? dabrafenib (TAFINLAR) 75 mg cap Take 2 Caps by mouth two (2) times a day. 120 Cap 3   ??? trametinib (MEKINIST) 2 mg tab Take 1 Tab by mouth daily. 30 Tab 3   ??? simvastatin (ZOCOR) 20 mg tablet Take 1 Tab by mouth nightly. 90 Tab 3   ??? amLODIPine (NORVASC) 10 mg tablet Take 1 Tab by mouth nightly. 90 Tab 3   ??? ibuprofen (MOTRIN) 200 mg tablet Take 200 mg by mouth every six (6) hours as needed for Pain.     ??? ferrous sulfate (IRON) 325 mg (65 mg iron) tablet Take  by mouth Daily (before breakfast).     ??? fluticasone (FLONASE) 50 mcg/actuation nasal spray 2 Sprays by Both Nostrils route nightly.     ??? calcium-cholecalciferol, d3, (CALCIUM 600 + D) 600-125 mg-unit tab Take  by mouth three (3) times daily.     ??? cetirizine (ZYRTEC) 10 mg tablet Take  by mouth.         OBJECTIVE:  Visit Vitals   ??? BP (!) 145/105  Comment: standing   ??? Pulse (!) 137   ??? Temp (!) 101.4 ??F (38.6 ??C) (Oral)   ??? Resp 22   ??? Ht 5' 7" (1.702 m)   ??? Wt 146 lb (66.2 kg)   ??? SpO2 94%   ??? BMI 22.87 kg/m2       Physical Exam:  Constitutional: Well developed, well nourished female in no acute distress, sitting comfortably in the exam room chair.    HEENT: Normocephalic and atraumatic. Oropharynx is clear, mucous membranes  are moist.  Sclerae anicteric. Neck supple without JVD. No thyromegaly present.    Lymph node   No palpable submandibular, cervical, supraclavicular lymph nodes.   Skin Warm and dry.  No bruising and no rash noted.  No erythema.  No pallor.    Respiratory Lungs are clear to auscultation bilaterally without wheezes, rales or rhonchi, normal air exchange without accessory muscle use.    CVS Normal rate, regular rhythm and normal S1 and S2.  No murmurs, gallops, or rubs.   Abdomen Soft, nontender and nondistended, normoactive bowel sounds.  No palpable mass.  No hepatosplenomegaly.   Neuro Grossly nonfocal with no obvious sensory or motor deficits.   MSK Normal range of motion in general.  No edema and no tenderness.   Psych Appropriate mood and affect.              Labs:  Recent Results (from the past 24 hour(s))   CBC WITH AUTOMATED DIFF    Collection Time: 03/12/17  8:23 AM   Result Value Ref Range    WBC 6.8 4.3 - 11.1 K/uL    RBC 4.86 4.05 - 5.25 M/uL    HGB 14.8 11.7 - 15.4 g/dL    HCT 42.4 35.8 - 46.3 %    MCV 87.2 79.6 - 97.8 FL    MCH 30.5 26.1 - 32.9 PG    MCHC 34.9 31.4 - 35.0 g/dL    RDW 14.8 (H) 11.9 - 14.6 %    PLATELET 268 150 - 450 K/uL  MPV 8.4 (L) 10.8 - 14.1 FL    ABSOLUTE NRBC 0.01 0.0 - 0.2 K/uL    DF AUTOMATED      NEUTROPHILS 71 43 - 78 %    LYMPHOCYTES 22 13 - 44 %    MONOCYTES 7 4.0 - 12.0 %    EOSINOPHILS 0 (L) 0.5 - 7.8 %    BASOPHILS 0 0.0 - 2.0 %    ABS. NEUTROPHILS 4.8 1.7 - 8.2 K/UL    ABS. LYMPHOCYTES 1.5 0.5 - 4.6 K/UL    ABS. MONOCYTES 0.5 0.1 - 1.3 K/UL    ABS. EOSINOPHILS 0.0 0.0 - 0.8 K/UL    ABS. BASOPHILS 0.0 0.0 - 0.2 K/UL   METABOLIC PANEL, COMPREHENSIVE    Collection Time: 03/12/17  8:23 AM   Result Value Ref Range    Sodium 132 (L) 136 - 145 mmol/L    Potassium 3.1 (L) 3.5 - 5.1 mmol/L    Chloride 94 (L) 98 - 107 mmol/L    CO2 28 21 - 32 mmol/L    Anion gap 10 7 - 16 mmol/L    Glucose 129 (H) 65 - 100 mg/dL    BUN 16 8 - 23 MG/DL    Creatinine 0.88 0.6 - 1.0 MG/DL     GFR est AA >60 >60 ml/min/1.68m    GFR est non-AA >60 >60 ml/min/1.744m   Calcium 8.7 8.3 - 10.4 MG/DL    Bilirubin, total 0.5 0.2 - 1.1 MG/DL    ALT (SGPT) 39 12 - 65 U/L    AST (SGOT) 40 (H) 15 - 37 U/L    Alk. phosphatase 105 50 - 136 U/L    Protein, total 7.1 6.3 - 8.2 g/dL    Albumin 2.7 (L) 3.2 - 4.6 g/dL    Globulin 4.4 (H) 2.3 - 3.5 g/dL    A-G Ratio 0.6 (L) 1.2 - 3.5     LD    Collection Time: 03/12/17  8:23 AM   Result Value Ref Range    LD 377 (H) 100 - 190 U/L         Imaging:  PET/CT: 10/29/2016  ??  INDICATION: Liver lesions. History melanoma in 2008.  ??  TECHNIQUE: After oral administration of gastroview and intravenous  administration of 15.44 mCi of F18 FDG, noncontrast CT images were obtained for  attenuation correction and for fusion with emission PET images. A series of  overlapping emission PET images were then obtained beginning 60 minutes after  injection of FDG. The area imaged spanned the region from the skull base to the  mid thighs.  ??  COMPARISON: CT chest, abdomen, and pelvis 10/15/2016  ??  FINDINGS:   NECK/CHEST:  Abnormal hyperdense brain masses are seen located in the left posterior temporal  lobe seen CT image 30 measuring 2.7 cm x 2.4 cm, and in the right frontal lobe  on image 28 measuring 1.2 cm x 0.8 cm. It is uncertain whether these represent  subacute spontaneous hemorrhages, hyperdense intracranial metastases, or  metastases which have subacutely hemorrhage. Mild abnormal PET activity is  favored to correspond to these findings both seen on PET image 302 and therefore  hyperdense intracranial metastases or intracranial metastases which have  subacutely hemorrhage are favored. These should be further assessed with a  contrasted brain MRI. Additional edema seen in the right parietal lobe on image  21 without a definite hyperdense abnormality.  ??  No abnormal activity is seen in the neck. No adenopathy  is seen.  ??   Abnormal activity is seen associated with prevascular lymph nodes the largest of  which is seen on image 109 measuring 1.3 cm x 1.1 cm concerning for metastatic  mediastinal adenopathy. In addition, abnormal activity is seen associated with  the previously described bilateral pulmonary nodules with the largest nodule  located in the medial right lower lobe on image 138 measuring 1.9 cm x 1.4 cm  consistent with pulmonary metastases. A subcutaneous lesion is seen in the left  back soft tissues on image 140 measuring 1.4 cm x 0.9 cm which demonstrates mild  activity raising concern for potential subcutaneous lesion such as recurrent  melanoma.  ??  ABDOMEN/PELVIS:  Abnormal activity is seen associated with previous as described hepatic lesions  consistent with hepatic metastases. The largest lesion is felt to reside in the  right lobe seen on image 151 measuring 3.5 cm x 3.2 cm. No worrisome activity is  otherwise seen. There is a 5 mm subcutaneous lesion in the right anterior  abdominal wall seen on CT image 202. No abnormal activity is seen associated  with this lesion although activity is likely underestimate given its small size.  An additional melanoma cannot be excluded. A gallstone is seen within a  noninflamed gallbladder.  ??  Only physiologic activity is seen in the pelvis.  ??  LEGS:  No hypermetabolic lesion or worrisome subcutaneous kidneys lesion is seen in the  lower extremities.  ??  IMPRESSION  IMPRESSION:   1. Small subcutaneous lesions in the left posterior back, and right anterior  abdominal wall whose appearance is concerning for recurrent melanoma.  ??  2. Abnormal activity associated with prior hepatic and pulmonary lesions  consistent with metastatic lesions. In addition, abnormal activity is seen  associated with mediastinal lymph nodes consistent with additional nodal  metastatic disease. Lastly, there are abnormal hyperdense masses in the brain   which demonstrate mild activity raising concern for intracranial metastases.  Given the hyperdense appearance, the possibility of subacute hemorrhage cannot  be excluded. These should be further assessed with a contrasted MRI of the  Brain.    Results for orders placed during the hospital encounter of 10/15/16   CT CHEST ABD PELV W CONT    Narrative CT SCAN OF THE CHEST WITH CONTRAST: 10/15/2016  Indication: Loss of appetite  Comparison: None   100cc of Isovue 350  was used in the detection of thoracic pathology.  Dose reduction techniques used: Automated exposure control, adjustment of the  mAs and/or kVp according to patient size, standardized low-dose protocol, and/or  iterative reconstruction technique.  Findings: Tracheobronchial tree is intact. There are multiple noncalcified  bilateral lung nodules of which the largest is in the left upper lobe measuring  12 mm. Thyroid is unremarkable. There is no significant mediastinal adenopathy.  Bony structures and the soft tissues are intact    CT scan the abdomen with contrast:    There are multiple low-density lesions throughout the hepatic parenchyma of  which the largest is in the left lobe measuring 20 mm x 25 mm. Gallstones are  present. Adrenal glands, pancreas, spleen and spleen is unremarkable. Right  kidney is unremarkable. There are multiple subcentimeter left renal cyst. Aorta,  small and large bowel, appendix, soft tissues, and the bony structures are  unremarkable. There is no significant adenopathy.    CT scan of pelvis with contrast:    The uterus, adnexal structures, bladder, soft tissues and the bony structures  are  unremarkable for mass lesions.      Impression IMPRESSION:: Gallstones, small left renal cysts, hepatic and pulmonary  metastatic lesions     Pathology:        ASSESSMENT:    ICD-10-CM ICD-9-CM    1. 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    Melanoma: metastatic to liver, lungs, nodes, and also concern for brain lesions on CT imaging from PET.  History of melanoma in 2008 resected from the back.  Symptoms including fatigue, malaise, anorexia, weight loss, blurry vision, mild intermittent confusion.  Her liver biopsy was definitive for metastatic melanoma, and PET confirmed the multiple lesions in the liver, lungs and lymph nodes.  There were also suspicious changes in the visualized portion of the brain.  We ordered a STAT MRI which confirmed multiple brain lesions, prompting [place] to initiate steroids and refer to radiation oncology.  She completed CyberKnife radiation to the brain lesions, but the day of the first CyberKnife treatment, she was noted to have significant tremor, almost to the point of suggesting some possible partial seizure activity.  She had significant left sided weakness as well.  She was admitted and started on high dose steroids, antihypertensives, and Keppra.  MRI brain confirmed the known hemorrhagic brain lesions, but Neurosurgery did not feel there was anything surgical.  She was able to complete the course of SRS and had some neurologic  recovery, with no more seizure-like activity.  Her pathology returned showing a BRAF mutation, we were trying to screen her for the [phone] trial, but with the extent of her disease and the neurologic changes, we felt that we did not have time to wait for immunotherapy effect and recommended upfront dabrafenib/trametinib.  [facility] went to [facility] and they agreed with the plan.  We also spent a great deal of time discussing long-term prognosis, which is limited by her metastatic disease which is incurable, but I explained that with BRAF/MEK inhibitors and with immunotherapy down the line, the potential for treatment response and prolongation of life was certainly realistic.  PET/CT after 3 months reviewed and shows dramatic systemic response, the liver and lung lesions have essentially resolved.  Her MRI of the brain was done today and shows a mixed response, Dr. Maceo Pro feels this may be pseudoprogression, he will present to the [facility] neuro tumor board for input and  discussion of possible biopsy/decompression.              Here for follow-up.  Now on dabrafenib/trametinib for about 4 months.  Since we last saw her, she developed recurrence of her aphasia and weakness.  Restarted on dex 4 mg q6h, with not much improvement.  Previously, these episodes have only lasted a couple of days, but this time the symptoms seem to be more persistent.  Dr. Maceo Pro has referred her to Dr. Raul Del to discuss potential biopsy and decompression of one of the larger lesions to both evaluate for progression vs pseudoprogression, and to attempt palliation of some of her CNS symptoms.  She does not have any systemic symptoms, no pain, no nausea, appetite is good.  She does have a fever this morning, and they have noticed some increase in a wet cough.  Although pyrexia is a very common AE from BRAF/MEK inhibition, I would still check a chest Xray to rule out any infiltrate which may require  antibiotic therapy.  Tolerating Taf/Mek well otherwise.  Labs reviewed and OK, her AST/ALT and her LDH are both improved.  At this point, given the excellent systemic response, I would not want to change her therapy as of yet.  If the CNS changes are documented as progressive disease, we may have no choice but to switch to immunotherapy.  Certainly, the concern remains with BRAF/MEK inhibitor therapy that patients can have dramatic responses, but they can be short-lived.  Continue Taf/Mek as planned for now.  All questions were asked and answered to the best of my ability.              Greer Ee, MD  [facility] [place] [place] [address]  Office : [phone]  Fax : [phone]

## 2017-03-12 NOTE — Patient Instructions (Signed)
Patient Instructions From Your Nurse    Reason for Visit:  Follow up visit    Plan:  -We think seeing the Neurosurgeons is still the best plan.  -Stay on the steroids as Dr. Maceo Pro has ordered.  -If we need to start the immunotherapy we would give Yervoy (Ipilumumab) and Opdivo (Nivolumab) together for 4 treatments (every 3 weeks) and then stay on the Pettit as a maintenance therapy.    Follow Up:  Follow up with Dr. Jenetta Loges or NP as scheduled    Recent Lab Results:  Recent Results (from the past 12 hour(s))   CBC WITH AUTOMATED DIFF    Collection Time: 03/12/17  8:23 AM   Result Value Ref Range    WBC 6.8 4.3 - 11.1 K/uL    RBC 4.86 4.05 - 5.25 M/uL    HGB 14.8 11.7 - 15.4 g/dL    HCT 42.4 35.8 - 46.3 %    MCV 87.2 79.6 - 97.8 FL    MCH 30.5 26.1 - 32.9 PG    MCHC 34.9 31.4 - 35.0 g/dL    RDW 14.8 (H) 11.9 - 14.6 %    PLATELET 268 150 - 450 K/uL    MPV 8.4 (L) 10.8 - 14.1 FL    ABSOLUTE NRBC 0.01 0.0 - 0.2 K/uL    DF AUTOMATED      NEUTROPHILS 71 43 - 78 %    LYMPHOCYTES 22 13 - 44 %    MONOCYTES 7 4.0 - 12.0 %    EOSINOPHILS 0 (L) 0.5 - 7.8 %    BASOPHILS 0 0.0 - 2.0 %    ABS. NEUTROPHILS 4.8 1.7 - 8.2 K/UL    ABS. LYMPHOCYTES 1.5 0.5 - 4.6 K/UL    ABS. MONOCYTES 0.5 0.1 - 1.3 K/UL    ABS. EOSINOPHILS 0.0 0.0 - 0.8 K/UL    ABS. BASOPHILS 0.0 0.0 - 0.2 K/UL   METABOLIC PANEL, COMPREHENSIVE    Collection Time: 03/12/17  8:23 AM   Result Value Ref Range    Sodium 132 (L) 136 - 145 mmol/L    Potassium 3.1 (L) 3.5 - 5.1 mmol/L    Chloride 94 (L) 98 - 107 mmol/L    CO2 28 21 - 32 mmol/L    Anion gap 10 7 - 16 mmol/L    Glucose 129 (H) 65 - 100 mg/dL    BUN 16 8 - 23 MG/DL    Creatinine 0.88 0.6 - 1.0 MG/DL    GFR est AA >60 >60 ml/min/1.63m    GFR est non-AA >60 >60 ml/min/1.76m   Calcium 8.7 8.3 - 10.4 MG/DL    Bilirubin, total 0.5 0.2 - 1.1 MG/DL    ALT (SGPT) 39 12 - 65 U/L    AST (SGOT) 40 (H) 15 - 37 U/L    Alk. phosphatase 105 50 - 136 U/L    Protein, total 7.1 6.3 - 8.2 g/dL    Albumin 2.7 (L) 3.2 - 4.6 g/dL     Globulin 4.4 (H) 2.3 - 3.5 g/dL    A-G Ratio 0.6 (L) 1.2 - 3.5     LD    Collection Time: 03/12/17  8:23 AM   Result Value Ref Range    LD 377 (H) 100 - 190 U/L         Care plan has been discussed and given to patient: n/a        -------------------------------------------------------------------------------------------------------------------  Please call our office at [phone] you have  any  of the following symptoms:   ?? Fever of 100.5 or greater  ?? Chills  ?? Shortness of breath  ?? Swelling or pain in one leg    After office hours an answering service is available and will contact a provider for emergencies or if you are experiencing any of the above symptoms.    ? Patient did express an interest in My Chart.  My Chart log in information explained on the after visit summary printout at the [facility] desk.    Salley Slaughter. Bobby Rumpf, RN

## 2017-03-15 ENCOUNTER — Encounter: Primary: Family Medicine

## 2017-03-15 ENCOUNTER — Encounter: Payer: PRIVATE HEALTH INSURANCE | Primary: Family Medicine

## 2017-03-16 ENCOUNTER — Encounter: Payer: PRIVATE HEALTH INSURANCE | Primary: Family Medicine

## 2017-03-17 ENCOUNTER — Encounter: Payer: PRIVATE HEALTH INSURANCE | Primary: Family Medicine

## 2017-03-17 NOTE — Progress Notes (Signed)
I have faxed the [place] to [phone] and received confirmation of receipt.  Scanned into media.

## 2017-03-18 ENCOUNTER — Encounter: Payer: PRIVATE HEALTH INSURANCE | Primary: Family Medicine

## 2017-03-22 ENCOUNTER — Encounter: Primary: Family Medicine

## 2017-03-22 ENCOUNTER — Inpatient Hospital Stay: Payer: Managed Care, Other (non HMO)

## 2017-03-23 ENCOUNTER — Encounter: Payer: PRIVATE HEALTH INSURANCE | Primary: Family Medicine

## 2017-03-24 ENCOUNTER — Encounter: Primary: Family Medicine

## 2017-03-24 ENCOUNTER — Ambulatory Visit: Payer: Self-pay

## 2017-03-24 ENCOUNTER — Ambulatory Visit: Payer: Self-pay | Admitting: Internal Medicine

## 2017-03-29 ENCOUNTER — Encounter: Payer: Self-pay | Admitting: Physician Assistant

## 2017-03-29 ENCOUNTER — Ambulatory Visit: Payer: Self-pay | Admitting: Physician Assistant

## 2017-03-29 VITALS — BP 140/80 | HR 79 | Temp 98.5°F

## 2017-03-29 DIAGNOSIS — J209 Acute bronchitis, unspecified: Secondary | ICD-10-CM

## 2017-03-29 MED ORDER — IPRATROPIUM-ALBUTEROL 0.5-2.5 (3) MG/3ML IN SOLN
3.0000 mL | Freq: Once | RESPIRATORY_TRACT | Status: AC
  Administered 2017-03-29: 3 mL via RESPIRATORY_TRACT

## 2017-03-29 MED ORDER — ALBUTEROL SULFATE HFA 108 (90 BASE) MCG/ACT IN AERS: 2.0000 | INHALATION_SPRAY | Freq: Four times a day (QID) | RESPIRATORY_TRACT | 0 refills | Status: DC | PRN

## 2017-03-29 MED ORDER — LEVOFLOXACIN 500 MG PO TABS: 500.0000 mg | ORAL_TABLET | Freq: Every day | ORAL | 0 refills | Status: DC

## 2017-03-29 NOTE — Progress Notes (Signed)
S: C/o cough and congestion with wheezing, chest is sore from coughing,+fever, chills on Thur/fri, mucus is green/brown, ; keeping pt awake at night;  denies cardiac type chest pain or sob, v/d, abd pain Remainder ros neg  O: vitals wnl, nad, tms clear, throat injected, neck supple no lymph, lungs with wheezing, cv rrr, neuro intact, svn duoneb given  A:  Acute bronchitis   P:  rx medication:  levaquin, albuterol inhaler, svn duoneb given in the clinic; use otc meds, tylenol or motrin as needed for fever/chills, return if not better in 3 -5 days, return earlier if worsening

## 2017-03-30 ENCOUNTER — Encounter

## 2017-03-30 ENCOUNTER — Encounter: Primary: Family Medicine

## 2017-03-31 ENCOUNTER — Inpatient Hospital Stay: Admit: 2017-03-31 | Payer: PRIVATE HEALTH INSURANCE | Primary: Family Medicine

## 2017-03-31 ENCOUNTER — Ambulatory Visit
Admit: 2017-03-31 | Discharge: 2017-03-31 | Payer: PRIVATE HEALTH INSURANCE | Attending: Internal Medicine | Primary: Family Medicine

## 2017-03-31 ENCOUNTER — Ambulatory Visit: Admit: 2017-03-31 | Discharge: 2017-03-31 | Payer: PRIVATE HEALTH INSURANCE | Primary: Family Medicine

## 2017-03-31 ENCOUNTER — Ambulatory Visit: Payer: Self-pay | Admitting: Physician Assistant

## 2017-03-31 VITALS — BP 130/90 | HR 95 | Temp 98.7°F

## 2017-03-31 DIAGNOSIS — C7931 Secondary malignant neoplasm of brain: Secondary | ICD-10-CM

## 2017-03-31 DIAGNOSIS — R530 Neoplastic (malignant) related fatigue: Secondary | ICD-10-CM

## 2017-03-31 DIAGNOSIS — R509 Fever, unspecified: Secondary | ICD-10-CM

## 2017-03-31 LAB — POCT INFLUENZA A/B
Influenza A, POC: NEGATIVE
Influenza B, POC: NEGATIVE

## 2017-03-31 LAB — CBC WITH AUTOMATED DIFF
ABS. BASOPHILS: 0 10*3/uL (ref 0.0–0.2)
ABS. EOSINOPHILS: 0 10*3/uL (ref 0.0–0.8)
ABS. LYMPHOCYTES: 1.6 10*3/uL (ref 0.5–4.6)
ABS. MONOCYTES: 0.5 10*3/uL (ref 0.1–1.3)
ABS. NEUTROPHILS: 4.7 10*3/uL (ref 1.7–8.2)
ABSOLUTE NRBC: 0.06 10*3/uL (ref 0.0–0.2)
BASOPHILS: 0 % (ref 0.0–2.0)
EOSINOPHILS: 0 % — ABNORMAL LOW (ref 0.5–7.8)
HCT: 41.4 % (ref 35.8–46.3)
HGB: 14.2 g/dL (ref 11.7–15.4)
LYMPHOCYTES: 24 % (ref 13–44)
MCH: 30.5 PG (ref 26.1–32.9)
MCHC: 34.3 g/dL (ref 31.4–35.0)
MCV: 88.8 FL (ref 79.6–97.8)
MONOCYTES: 7 % (ref 4.0–12.0)
MPV: 8.1 FL — ABNORMAL LOW (ref 10.8–14.1)
NEUTROPHILS: 69 % (ref 43–78)
PLATELET: 341 10*3/uL (ref 150–450)
RBC: 4.66 M/uL (ref 4.05–5.25)
RDW: 15.9 % — ABNORMAL HIGH (ref 11.9–14.6)
WBC: 6.8 10*3/uL (ref 4.3–11.1)

## 2017-03-31 LAB — METABOLIC PANEL, COMPREHENSIVE
A-G Ratio: 0.6 — ABNORMAL LOW (ref 1.2–3.5)
ALT (SGPT): 34 U/L (ref 12–65)
AST (SGOT): 18 U/L (ref 15–37)
Albumin: 2.5 g/dL — ABNORMAL LOW (ref 3.2–4.6)
Alk. phosphatase: 125 U/L (ref 50–136)
Anion gap: 8 mmol/L (ref 7–16)
BUN: 29 MG/DL — ABNORMAL HIGH (ref 8–23)
Bilirubin, total: 0.3 MG/DL (ref 0.2–1.1)
CO2: 29 mmol/L (ref 21–32)
Calcium: 9.1 MG/DL (ref 8.3–10.4)
Chloride: 97 mmol/L — ABNORMAL LOW (ref 98–107)
Creatinine: 1.16 MG/DL — ABNORMAL HIGH (ref 0.6–1.0)
GFR est AA: >60 mL/min/{1.73_m2} (ref >60)
GFR est non-AA: 51 mL/min/{1.73_m2} — ABNORMAL LOW (ref >60)
Globulin: 4.3 g/dL — ABNORMAL HIGH (ref 2.3–3.5)
Glucose: 113 mg/dL — ABNORMAL HIGH (ref 65–100)
Potassium: 3.1 mmol/L — ABNORMAL LOW (ref 3.5–5.1)
Protein, total: 6.8 g/dL (ref 6.3–8.2)
Sodium: 134 mmol/L — ABNORMAL LOW (ref 136–145)

## 2017-03-31 LAB — LD: LD: 345 U/L — ABNORMAL HIGH (ref 100–190)

## 2017-03-31 MED ORDER — OTHER(NON-FORMULARY)
0 refills | Status: DC
Start: 2017-03-31 — End: 2017-04-20

## 2017-03-31 MED ORDER — LEVOFLOXACIN 500 MG PO TABS: 500.0000 mg | ORAL_TABLET | Freq: Every day | ORAL | 0 refills | Status: DC

## 2017-03-31 NOTE — Progress Notes (Signed)
S: here for recheck, states mon and tues had fever/chills/sweats, is a little better but doesn't feel well yet, some sore throat, no cp/sob, no v/d  O: vitals wnl, nad, mouth with a few red spots on roof, throat wnl, neck supple no lymph, lungs c t a, cv rrr, flu swab neg  A: improving bronchitis/pneumonia  P: continue levaquin, added few extra days as pt has absorption problems, dukes magic mouthwash

## 2017-03-31 NOTE — Progress Notes (Signed)
[facility]: Office Visit Established Patient    Chief Complaint:    Chief Complaint   Patient presents with   ??? Melanoma         History of Present Illness:  Katelyn [name] is a 61 y.o. 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  She has a history of osteopenia, hypertension, hypercholesterolemia, and melanoma of the back s/p excision in 2008, sentinel node was negative but she is unsure what T stage.  She initially presented to her PCP on 10/14/16 reporting a one month history of intermittent upper abdominal pain with loss of appetite and right scapular pain radiating to her right arm.  Ultrasound of the abdomen, obtained on 10/15/16, identified multiple lesions in the liver, concerning for metastatic disease, as well as small hypoechoic areas near the tail of the pancreas, possibly adenopathy.  Further imaging was performed with CT scan of the chest, this showed hepatic and pulmonary lesions highly concerning for metastatic disease.  Lung nodules measured up to 12 mm and liver lesions measured up to 25 mm.  She was referred for urgent oncologic consultation, and liver biopsy was definitive for metastatic melanoma, and PET confirmed the multiple lesions in the liver, lungs and lymph nodes.  There were also suspicious changes in the visualized portion of the brain.  We ordered a STAT MRI which confirmed multiple brain lesions, prompting [place] to initiate steroids and refer to radiation oncology.  She completed CyberKnife radiation to the brain lesions, but the day of the first CyberKnife treatment, she was noted to have significant tremor, almost to the point of suggesting some possible partial seizure activity.  She had significant left sided weakness as well.  She was admitted and started on high dose steroids, antihypertensives, and Keppra.  MRI brain confirmed the known hemorrhagic brain lesions, but Neurosurgery did not feel there  was anything surgical.  She was able to complete the course of SRS and had some neurologic recovery, with no more seizure-like activity.  Her pathology returned showing a BRAF mutation, we were trying to screen her for the [id number] trial, but with the extent of her disease and the neurologic changes, we felt that we did not have time to wait for immunotherapy effect and recommended upfront dabrafenib/trametinib.  She went to [facility] and they agreed with the plan.  We also spent a great deal of time discussing long-term prognosis, which is limited by her metastatic disease which is incurable, but I explained that with BRAF/MEK inhibitors and with immunotherapy down the line, the potential for treatment response and prolongation of life was certainly realistic.  PET/CT performed after 3 months reviewed and shows dramatic systemic response, the liver and lung lesions have essentially resolved.  Her MRI of the brain was done today and shows a mixed response, Dr. Maceo Pro feels this may be pseudoprogression, he will present to the [facility] neuro tumor board for input and discussion of possible biopsy/decompression.            Here for follow-up.  Now on dabrafenib/trametinib for about 5 months.  She has been doing very well, her husband feels like this is the best she has been since last December.  She has been active, cleaned the kitchen this morning on her own.  She remains on dex, currently 4 q8, as they have been reluctant to decrease this given the possible association with her episodes of aphasia and weakness.  She met with Dr.  Mina to discuss potential biopsy and decompression of one of the larger lesions to both evaluate for progression vs pseudoprogression, and to attempt palliation of some of her CNS symptoms.  However, Dr. Raul Del felt this would be futile without completely resecting the entire lesion, and that her risk of permanent aphasia or other negative neurologic outcomes would be high with  resection.  She does not have any systemic symptoms, no pain, no nausea, appetite is good.  No dyspnea.  Tolerating Taf/Mek well with no emergent AEs.       Review of Systems:  Constitutional: Negative.   HENT: Negative.   Eyes: Negative.   Respiratory: Negative.   Cardiovascular: Negative.   Gastrointestinal: Negative.   Genitourinary: Negative.   Musculoskeletal: Negative.   Skin: Negative.   Neurological: Negative.   Endo/Heme/Allergies: Negative.   Psychiatric/Behavioral: Negative.   All other systems reviewed and are negative.        Allergies   Allergen Reactions   ??? Adhesive Tape-Silicones Rash     Past Medical History:   Diagnosis Date   ??? Cancer ([place])     melanoma - back   ??? Hypercholesterolemia    ??? Hypertension     managed with meds   ??? Nausea & vomiting    ??? Osteopenia      Past Surgical History:   Procedure Laterality Date   ??? HX COLONOSCOPY     ??? HX GYN      c-section x 5   ??? HX ORTHOPAEDIC Left 04/30/2016    knee   ??? HX OTHER SURGICAL      melanoma removed from back     Family History   Problem Relation Age of Onset   ??? Hypertension Mother    ??? Elevated Lipids Mother    ??? Elevated Lipids Father    ??? Hypertension Father    ??? Arthritis-osteo Father      spine   ??? Heart Disease Father    ??? Breast Cancer Other    ??? Diabetes Sister      Social History     Social History   ??? Marital status: MARRIED     Spouse name: N/A   ??? Number of children: N/A   ??? Years of education: N/A     Occupational History   ??? Not on file.     Social History Main Topics   ??? Smoking status: Never Smoker   ??? Smokeless tobacco: Never Used   ??? Alcohol use No   ??? Drug use: Not on file   ??? Sexual activity: Not on file     Other Topics Concern   ??? Not on file     Social History Narrative     Current Outpatient Prescriptions   Medication Sig Dispense Refill   ??? OTHER,NON-FORMULARY, Handicap Placard 1 Each 0   ??? ibuprofen (MOTRIN) 200 mg tablet Take 200 mg by mouth every six (6) hours as needed for Pain.      ??? dexamethasone (DECADRON) 4 mg tablet Take 1 Tab by mouth every six (6) hours for 30 days. (Patient taking differently: Take 4 mg by mouth every six (6) hours. 4 Tabs every 6 hours) 120 Tab 0   ??? pantoprazole (PROTONIX) 40 mg tablet Take 1 Tab by mouth daily. 30 Tab 0   ??? metoprolol tartrate (LOPRESSOR) 25 mg tablet Take 12.5 mg by mouth.     ??? traMADol (ULTRAM) 50 mg tablet Take 1 Tab by mouth every six (  6) hours as needed for Pain. Max Daily Amount: 200 mg. Indications: Pain 60 Tab 0   ??? losartan (COZAAR) 100 mg tablet Take 100 mg by mouth.     ??? acetaminophen (TYLENOL) 325 mg tablet Take 650 mg by mouth.     ??? HYDROcodone-acetaminophen (NORCO) 7.5-325 mg per tablet Take 1 Tab by mouth every four (4) hours as needed. Max Daily Amount: 6 Tabs. 20 Tab 0   ??? levETIRAcetam (KEPPRA) 500 mg tablet Take 1 Tab by mouth two (2) times a day. 60 Tab 11   ??? LORazepam (ATIVAN) 1 mg tablet Take 1 Tab by mouth nightly as needed (insomnia). Max Daily Amount: 1 mg. 30 Tab 0   ??? dabrafenib (TAFINLAR) 75 mg cap Take 2 Caps by mouth two (2) times a day. 120 Cap 3   ??? trametinib (MEKINIST) 2 mg tab Take 1 Tab by mouth daily. 30 Tab 3   ??? simvastatin (ZOCOR) 20 mg tablet Take 1 Tab by mouth nightly. 90 Tab 3   ??? amLODIPine (NORVASC) 10 mg tablet Take 1 Tab by mouth nightly. 90 Tab 3   ??? ferrous sulfate (IRON) 325 mg (65 mg iron) tablet Take  by mouth Daily (before breakfast).     ??? fluticasone (FLONASE) 50 mcg/actuation nasal spray 2 Sprays by Both Nostrils route nightly.     ??? calcium-cholecalciferol, d3, (CALCIUM 600 + D) 600-125 mg-unit tab Take  by mouth three (3) times daily.     ??? cetirizine (ZYRTEC) 10 mg tablet Take  by mouth.         OBJECTIVE:  There were no vitals taken for this visit.    Physical Exam:  Constitutional: Well developed, well nourished female in no acute distress, sitting comfortably in the exam room chair.    HEENT: Normocephalic and atraumatic. Oropharynx is clear, mucous membranes  are moist.  Sclerae anicteric. Neck supple without JVD. No thyromegaly present.    Lymph node   No palpable submandibular, cervical, supraclavicular lymph nodes.   Skin Warm and dry.  No bruising and no rash noted.  No erythema.  No pallor.    Respiratory Lungs are clear to auscultation bilaterally without wheezes, rales or rhonchi, normal air exchange without accessory muscle use.    CVS Normal rate, regular rhythm and normal S1 and S2.  No murmurs, gallops, or rubs.   Abdomen Soft, nontender and nondistended, normoactive bowel sounds.  No palpable mass.  No hepatosplenomegaly.   Neuro Grossly nonfocal with no obvious sensory or motor deficits.   MSK Normal range of motion in general.  No edema and no tenderness.   Psych Appropriate mood and affect.              Labs:  Recent Results (from the past 24 hour(s))   CBC WITH AUTOMATED DIFF    Collection Time: 03/31/17  2:38 PM   Result Value Ref Range    WBC 6.8 4.3 - 11.1 K/uL    RBC 4.66 4.05 - 5.25 M/uL    HGB 14.2 11.7 - 15.4 g/dL    HCT 41.4 35.8 - 46.3 %    MCV 88.8 79.6 - 97.8 FL    MCH 30.5 26.1 - 32.9 PG    MCHC 34.3 31.4 - 35.0 g/dL    RDW 15.9 (H) 11.9 - 14.6 %    PLATELET 341 150 - 450 K/uL    MPV 8.1 (L) 10.8 - 14.1 FL    ABSOLUTE NRBC 0.06 0.0 - 0.2  K/uL    DF AUTOMATED      NEUTROPHILS 69 43 - 78 %    LYMPHOCYTES 24 13 - 44 %    MONOCYTES 7 4.0 - 12.0 %    EOSINOPHILS 0 (L) 0.5 - 7.8 %    BASOPHILS 0 0.0 - 2.0 %    ABS. NEUTROPHILS 4.7 1.7 - 8.2 K/UL    ABS. LYMPHOCYTES 1.6 0.5 - 4.6 K/UL    ABS. MONOCYTES 0.5 0.1 - 1.3 K/UL    ABS. EOSINOPHILS 0.0 0.0 - 0.8 K/UL    ABS. BASOPHILS 0.0 0.0 - 0.2 K/UL   METABOLIC PANEL, COMPREHENSIVE    Collection Time: 03/31/17  2:38 PM   Result Value Ref Range    Sodium 134 (L) 136 - 145 mmol/L    Potassium 3.1 (L) 3.5 - 5.1 mmol/L    Chloride 97 (L) 98 - 107 mmol/L    CO2 29 21 - 32 mmol/L    Anion gap 8 7 - 16 mmol/L    Glucose 113 (H) 65 - 100 mg/dL    BUN 29 (H) 8 - 23 MG/DL    Creatinine 1.16 (H) 0.6 - 1.0 MG/DL     GFR est AA >60 >60 ml/min/1.38m    GFR est non-AA 51 (L) >60 ml/min/1.736m   Calcium 9.1 8.3 - 10.4 MG/DL    Bilirubin, total 0.3 0.2 - 1.1 MG/DL    ALT (SGPT) 34 12 - 65 U/L    AST (SGOT) 18 15 - 37 U/L    Alk. phosphatase 125 50 - 136 U/L    Protein, total 6.8 6.3 - 8.2 g/dL    Albumin 2.5 (L) 3.2 - 4.6 g/dL    Globulin 4.3 (H) 2.3 - 3.5 g/dL    A-G Ratio 0.6 (L) 1.2 - 3.5     LD    Collection Time: 03/31/17  2:38 PM   Result Value Ref Range    LD 345 (H) 100 - 190 U/L         Imaging:  PET/CT: 10/29/2016  ??  INDICATION: Liver lesions. History melanoma in 2008.  ??  TECHNIQUE: After oral administration of gastroview and intravenous  administration of 15.44 mCi of F18 FDG, noncontrast CT images were obtained for  attenuation correction and for fusion with emission PET images. A series of  overlapping emission PET images were then obtained beginning 60 minutes after  injection of FDG. The area imaged spanned the region from the skull base to the  mid thighs.  ??  COMPARISON: CT chest, abdomen, and pelvis 10/15/2016  ??  FINDINGS:   NECK/CHEST:  Abnormal hyperdense brain masses are seen located in the left posterior temporal  lobe seen CT image 30 measuring 2.7 cm x 2.4 cm, and in the right frontal lobe  on image 28 measuring 1.2 cm x 0.8 cm. It is uncertain whether these represent  subacute spontaneous hemorrhages, hyperdense intracranial metastases, or  metastases which have subacutely hemorrhage. Mild abnormal PET activity is  favored to correspond to these findings both seen on PET image 302 and therefore  hyperdense intracranial metastases or intracranial metastases which have  subacutely hemorrhage are favored. These should be further assessed with a  contrasted brain MRI. Additional edema seen in the right parietal lobe on image  21 without a definite hyperdense abnormality.  ??  No abnormal activity is seen in the neck. No adenopathy is seen.  ??   Abnormal activity is seen associated with prevascular lymph  nodes the largest of  which is seen on image 109 measuring 1.3 cm x 1.1 cm concerning for metastatic  mediastinal adenopathy. In addition, abnormal activity is seen associated with  the previously described bilateral pulmonary nodules with the largest nodule  located in the medial right lower lobe on image 138 measuring 1.9 cm x 1.4 cm  consistent with pulmonary metastases. A subcutaneous lesion is seen in the left  back soft tissues on image 140 measuring 1.4 cm x 0.9 cm which demonstrates mild  activity raising concern for potential subcutaneous lesion such as recurrent  melanoma.  ??  ABDOMEN/PELVIS:  Abnormal activity is seen associated with previous as described hepatic lesions  consistent with hepatic metastases. The largest lesion is felt to reside in the  right lobe seen on image 151 measuring 3.5 cm x 3.2 cm. No worrisome activity is  otherwise seen. There is a 5 mm subcutaneous lesion in the right anterior  abdominal wall seen on CT image 202. No abnormal activity is seen associated  with this lesion although activity is likely underestimate given its small size.  An additional melanoma cannot be excluded. A gallstone is seen within a  noninflamed gallbladder.  ??  Only physiologic activity is seen in the pelvis.  ??  LEGS:  No hypermetabolic lesion or worrisome subcutaneous kidneys lesion is seen in the  lower extremities.  ??  IMPRESSION  IMPRESSION:   1. Small subcutaneous lesions in the left posterior back, and right anterior  abdominal wall whose appearance is concerning for recurrent melanoma.  ??  2. Abnormal activity associated with prior hepatic and pulmonary lesions  consistent with metastatic lesions. In addition, abnormal activity is seen  associated with mediastinal lymph nodes consistent with additional nodal  metastatic disease. Lastly, there are abnormal hyperdense masses in the brain   which demonstrate mild activity raising concern for intracranial metastases.  Given the hyperdense appearance, the possibility of subacute hemorrhage cannot  be excluded. These should be further assessed with a contrasted MRI of the  Brain.    Results for orders placed during the hospital encounter of 10/15/16   CT CHEST ABD PELV W CONT    Narrative CT SCAN OF THE CHEST WITH CONTRAST: 10/15/2016  Indication: Loss of appetite  Comparison: [name]   100cc of Isovue 350  was used in the detection of thoracic pathology.  Dose reduction techniques used: Automated exposure control, adjustment of the  mAs and/or kVp according to patient size, standardized low-dose protocol, and/or  iterative reconstruction technique.  Findings: Tracheobronchial tree is intact. There are multiple noncalcified  bilateral lung nodules of which the largest is in the left upper lobe measuring  12 mm. Thyroid is unremarkable. There is no significant mediastinal adenopathy.  Bony structures and the soft tissues are intact    CT scan the abdomen with contrast:    There are multiple low-density lesions throughout the hepatic parenchyma of  which the largest is in the left lobe measuring 20 mm x 25 mm. Gallstones are  present. Adrenal glands, pancreas, spleen and spleen is unremarkable. Right  kidney is unremarkable. There are multiple subcentimeter left renal cyst. Aorta,  small and large bowel, appendix, soft tissues, and the bony structures are  unremarkable. There is no significant adenopathy.    CT scan of pelvis with contrast:    The uterus, adnexal structures, bladder, soft tissues and the bony structures  are unremarkable for mass lesions.      Impression IMPRESSION:: Gallstones, small left  renal cysts, hepatic and pulmonary  metastatic lesions     Pathology:        ASSESSMENT:    ICD-10-CM ICD-9-CM    1. 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    Melanoma: metastatic to liver, lungs, nodes, and also concern for brain lesions on CT imaging from PET.  History of melanoma in 2008 resected from the back.  Symptoms including fatigue, malaise, anorexia, weight loss, blurry vision, mild intermittent confusion.  Her liver biopsy was definitive for metastatic melanoma, and PET confirmed the multiple lesions in the liver, lungs and lymph nodes.  There were also suspicious changes in the visualized portion of the brain.  We ordered a STAT MRI which confirmed multiple brain lesions, prompting [place] to initiate steroids and refer to radiation oncology.  She completed CyberKnife radiation to the brain lesions, but the day of the first CyberKnife treatment, she was noted to have significant tremor, almost to the point of suggesting some possible partial seizure activity.  She had significant left sided weakness as well.  She was admitted and started on high dose steroids, antihypertensives, and Keppra.  MRI brain confirmed the known hemorrhagic brain lesions, but Neurosurgery did not feel there was anything surgical.  She was able to complete the course of SRS and had some neurologic  recovery, with no more seizure-like activity.  Her pathology returned showing a BRAF mutation, we were trying to screen her for the [phone] trial, but with the extent of her disease and the neurologic changes, we felt that we did not have time to wait for immunotherapy effect and recommended upfront dabrafenib/trametinib.  She went to [facility] and they agreed with the plan.  We also spent a great deal of time discussing long-term prognosis, which is limited by her metastatic disease which is incurable, but I explained that with BRAF/MEK inhibitors and with immunotherapy down the line, the potential for treatment response and prolongation of life was certainly realistic.  PET/CT after 3 months reviewed and shows dramatic systemic response, the liver and lung lesions have essentially resolved.  Her MRI of the brain was done today and shows a mixed response, Dr. Maceo Pro feels this may be pseudoprogression, he will present to the [facility] neuro tumor board for input and discussion of possible biopsy/decompression.  Here for follow-up.  Now on dabrafenib/trametinib for about 5 months.  She has been doing very well, her husband feels like this is the best she has been since last December.  She has been active, cleaned the kitchen this morning on her own.  She remains on dex, currently 4 q8, as they have been reluctant to decrease this given the possible association with her episodes of aphasia and weakness.  I agree that this should be done slowly, I would defer to Dr. Maceo Pro to make changes given the etiology and nature of her neurologic symptoms.  She met with Dr. Raul Del to discuss potential biopsy and decompression of one of the larger lesions to both evaluate for progression vs pseudoprogression, and to attempt palliation of some of her CNS symptoms.  However, Dr. Raul Del felt this would be futile without completely resecting the entire lesion, and that her risk of  permanent aphasia or other negative neurologic outcomes would be high with resection.  She does not have any systemic symptoms, no pain, no nausea, appetite is good.  No dyspnea.  Tolerating Taf/Mek well with no emergent AEs.  Labs reviewed and OK, her AST/ALT and her LDH are both improved.  At this point, given the excellent systemic response, I would not want to change her therapy as of yet.  If she were to have documented progressive disease in the brain, associated with neurologic decline, we may have no choice but to switch to immunotherapy.  However, at this point this is not the case and Taf/Mek should be continued.  Certainly, the concern remains with BRAF/MEK inhibitor therapy that patients can have dramatic responses, but they can be short-lived.  They are aware of this and will notify us with any new toxicity.  All questions were asked and answered to the best of my ability.  F/u in 1 month, PET in 2 months.             Greer Ee, MD  [facility] [place] [place] [address]  Office : [phone]  Fax : [phone]

## 2017-03-31 NOTE — Progress Notes (Signed)
Outpatient Palliative Care at the  [facility]: Office Visit Established Patient Follow Up    Chief Complaint:    Chief Complaint   Patient presents with   ??? Follow-up     Diagnosis: metastatic melanoma    Treatment Plan: s/p XRT; currently on Taflinar/Mekinist    Treatment Intent: palliative    Medical Oncologist: Dr. Jenetta Loges    Radiation Oncologist: Dr. Maceo Pro    History of Present Illness:  Ms. Hancher is a 61 y.o. female who presents today for evaluation regarding pain and symptom management, introduction to palliative care in the setting of metastatic melanoma with brain, liver, lung involvement.  Pt was initially diagnosed 09/2016.  Pt brain lesions were confirmed hemorrhagic, ut neurosurgery di not feel there was need for surgical intervention.  She did compelted SRS with some neurologic recovery.  Pathology showed BRAF mutation.  She started dabrafenib/trametinib with this plan confirmed by 2nd opinion at [facility].   Pt has been on dabrafenib/trametinib for 2.5 months. Pt had PET scan with evidence of resolution of the hypermetabolic activity of the liver lesions and of the lesion in chest. There appears to be enlargement of brain lesions, but there is the possibility of pseudo-progression vs actual progression.  Pt has had waxing/wanging of cognitive function and motor skills, roughly every 4-5 weeks. Was recently hospitalized for the same.  This typically coincides with reduction in dexamethasone.  Pt remains on dexamethasone 12 mg daily in 3 divided doses.  Had been on 16 mg daily.  Pt currently feels quite well.  She was able to clean up around her home which gave her a great sense of satisfaction.  She denies pain.  Is eating well.   Dr. Raul Del, neurosurgery, had been consulted regarding possible surgical options, however it is felt that these would be too risky and not yield a high enough benefit.  Pt and family have met with Dr. Raul Del and voice  understanding.  Pt is here today with her spouse and daughters x2.     Review of Systems:  Review of Systems   Constitutional: Positive for malaise/fatigue.        Markedly improving   HENT: Negative.    Eyes: Negative.    Respiratory: Negative.    Cardiovascular: Negative.    Gastrointestinal: Negative.    Genitourinary: Negative.    Musculoskeletal: Positive for back pain.        Resolved   Skin: Negative.    Neurological: Negative for headaches.   Endo/Heme/Allergies: Negative.    Psychiatric/Behavioral: Positive for memory loss.         Allergies   Allergen Reactions   ??? Adhesive Tape-Silicones Rash     Past Medical History:   Diagnosis Date   ??? Cancer ([place])     melanoma - back   ??? Hypercholesterolemia    ??? Hypertension     managed with meds   ??? Nausea & vomiting    ??? Osteopenia      Past Surgical History:   Procedure Laterality Date   ??? HX COLONOSCOPY     ??? HX GYN      c-section x 5   ??? HX ORTHOPAEDIC Left 04/30/2016    knee   ??? HX OTHER SURGICAL      melanoma removed from back     Family History   Problem Relation Age of Onset   ??? Hypertension Mother    ??? Elevated Lipids Mother    ??? Elevated Lipids  Father    ??? Hypertension Father    ??? Arthritis-osteo Father      spine   ??? Heart Disease Father    ??? Breast Cancer Other    ??? Diabetes Sister      Social History     Social History   ??? Marital status: MARRIED     Spouse name: N/A   ??? Number of children: N/A   ??? Years of education: N/A     Occupational History   ??? Not on file.     Social History Main Topics   ??? Smoking status: Never Smoker   ??? Smokeless tobacco: Never Used   ??? Alcohol use No   ??? Drug use: Not on file   ??? Sexual activity: Not on file     Other Topics Concern   ??? Not on file     Social History Narrative     Current Outpatient Prescriptions   Medication Sig Dispense Refill   ??? ibuprofen (MOTRIN) 200 mg tablet Take 200 mg by mouth every six (6) hours as needed for Pain.     ??? dexamethasone (DECADRON) 4 mg tablet Take 1 Tab by mouth every six (6)  hours for 30 days. (Patient taking differently: Take 4 mg by mouth every six (6) hours. 4 Tabs every 6 hours) 120 Tab 0   ??? metoprolol tartrate (LOPRESSOR) 25 mg tablet Take 12.5 mg by mouth.     ??? losartan (COZAAR) 100 mg tablet Take 100 mg by mouth.     ??? acetaminophen (TYLENOL) 325 mg tablet Take 650 mg by mouth.     ??? HYDROcodone-acetaminophen (NORCO) 7.5-325 mg per tablet Take 1 Tab by mouth every four (4) hours as needed. Max Daily Amount: 6 Tabs. 20 Tab 0   ??? levETIRAcetam (KEPPRA) 500 mg tablet Take 1 Tab by mouth two (2) times a day. 60 Tab 11   ??? LORazepam (ATIVAN) 1 mg tablet Take 1 Tab by mouth nightly as needed (insomnia). Max Daily Amount: 1 mg. 30 Tab 0   ??? dabrafenib (TAFINLAR) 75 mg cap Take 2 Caps by mouth two (2) times a day. 120 Cap 3   ??? trametinib (MEKINIST) 2 mg tab Take 1 Tab by mouth daily. 30 Tab 3   ??? simvastatin (ZOCOR) 20 mg tablet Take 1 Tab by mouth nightly. 90 Tab 3   ??? amLODIPine (NORVASC) 10 mg tablet Take 1 Tab by mouth nightly. 90 Tab 3   ??? fluticasone (FLONASE) 50 mcg/actuation nasal spray 2 Sprays by Both Nostrils route nightly.     ??? cetirizine (ZYRTEC) 10 mg tablet Take  by mouth.     ??? OTHER,NON-FORMULARY, Handicap Placard 1 Each 0   ??? pantoprazole (PROTONIX) 40 mg tablet Take 1 Tab by mouth daily. 30 Tab 0   ??? traMADol (ULTRAM) 50 mg tablet Take 1 Tab by mouth every six (6) hours as needed for Pain. Max Daily Amount: 200 mg. Indications: Pain 60 Tab 0   ??? ferrous sulfate (IRON) 325 mg (65 mg iron) tablet Take  by mouth Daily (before breakfast).     ??? calcium-cholecalciferol, d3, (CALCIUM 600 + D) 600-125 mg-unit tab Take  by mouth three (3) times daily.         OBJECTIVE:     Vitals 03/31/2017   Blood Pressure 141/99   Pulse 80   Temp 97.9   Resp 18   Height _0    Weight 146 lb 9.6 oz   SpO2  97   BSA 1.77 m2   BMI 22.96 kg/m2         Physical Exam:  Constitutional: Well developed, well nourished female in no acute distress. Daughters x2 and spouse present.    HEENT: Normocephalic and atraumatic. Oropharynx is clear, mucous membranes are moist.  Pupils are equal, round, and reactive to light. Extraocular muscles are intact.  Sclerae anicteric. Neck supple without JVD.   Lymph node   deferred   Skin Warm and dry.  No bruising and no rash noted.  No erythema.  No pallor.    Respiratory unlabored    CVS deferred   Abdomen deferred   Neuro Alert and oriented x3; no difficulty with speech/word finding   MSK Normal range of motion in general.  ambulatory   Psych Appropriate mood and affect.        Labs:  Recent Results (from the past 24 hour(s))   CBC WITH AUTOMATED DIFF    Collection Time: 03/31/17  2:38 PM   Result Value Ref Range    WBC 6.8 4.3 - 11.1 K/uL    RBC 4.66 4.05 - 5.25 M/uL    HGB 14.2 11.7 - 15.4 g/dL    HCT 41.4 35.8 - 46.3 %    MCV 88.8 79.6 - 97.8 FL    MCH 30.5 26.1 - 32.9 PG    MCHC 34.3 31.4 - 35.0 g/dL    RDW 15.9 (H) 11.9 - 14.6 %    PLATELET 341 150 - 450 K/uL    MPV 8.1 (L) 10.8 - 14.1 FL    ABSOLUTE NRBC 0.06 0.0 - 0.2 K/uL    DF AUTOMATED      NEUTROPHILS 69 43 - 78 %    LYMPHOCYTES 24 13 - 44 %    MONOCYTES 7 4.0 - 12.0 %    EOSINOPHILS 0 (L) 0.5 - 7.8 %    BASOPHILS 0 0.0 - 2.0 %    ABS. NEUTROPHILS 4.7 1.7 - 8.2 K/UL    ABS. LYMPHOCYTES 1.6 0.5 - 4.6 K/UL    ABS. MONOCYTES 0.5 0.1 - 1.3 K/UL    ABS. EOSINOPHILS 0.0 0.0 - 0.8 K/UL    ABS. BASOPHILS 0.0 0.0 - 0.2 K/UL   METABOLIC PANEL, COMPREHENSIVE    Collection Time: 03/31/17  2:38 PM   Result Value Ref Range    Sodium 134 (L) 136 - 145 mmol/L    Potassium 3.1 (L) 3.5 - 5.1 mmol/L    Chloride 97 (L) 98 - 107 mmol/L    CO2 29 21 - 32 mmol/L    Anion gap 8 7 - 16 mmol/L    Glucose 113 (H) 65 - 100 mg/dL    BUN 29 (H) 8 - 23 MG/DL    Creatinine 1.16 (H) 0.6 - 1.0 MG/DL    GFR est AA >60 >60 ml/min/1.82m    GFR est non-AA 51 (L) >60 ml/min/1.735m   Calcium 9.1 8.3 - 10.4 MG/DL    Bilirubin, total 0.3 0.2 - 1.1 MG/DL    ALT (SGPT) 34 12 - 65 U/L    AST (SGOT) 18 15 - 37 U/L     Alk. phosphatase 125 50 - 136 U/L    Protein, total 6.8 6.3 - 8.2 g/dL    Albumin 2.5 (L) 3.2 - 4.6 g/dL    Globulin 4.3 (H) 2.3 - 3.5 g/dL    A-G Ratio 0.6 (L) 1.2 - 3.5     LD    Collection  Time: 04/14/17  2:38 PM   Result Value Ref Range    LD 345 (H) 100 - 190 U/L       Imaging:  Results for orders placed during the hospital encounter of 10/15/16   CT CHEST ABD PELV W CONT    Narrative CT SCAN OF THE CHEST WITH CONTRAST: 10/15/2016  Indication: Loss of appetite  Comparison: None   100cc of Isovue 350  was used in the detection of thoracic pathology.  Dose reduction techniques used: Automated exposure control, adjustment of the  mAs and/or kVp according to patient size, standardized low-dose protocol, and/or  iterative reconstruction technique.  Findings: Tracheobronchial tree is intact. There are multiple noncalcified  bilateral lung nodules of which the largest is in the left upper lobe measuring  12 mm. Thyroid is unremarkable. There is no significant mediastinal adenopathy.  Bony structures and the soft tissues are intact    CT scan the abdomen with contrast:    There are multiple low-density lesions throughout the hepatic parenchyma of  which the largest is in the left lobe measuring 20 mm x 25 mm. Gallstones are  present. Adrenal glands, pancreas, spleen and spleen is unremarkable. Right  kidney is unremarkable. There are multiple subcentimeter left renal cyst. Aorta,  small and large bowel, appendix, soft tissues, and the bony structures are  unremarkable. There is no significant adenopathy.    CT scan of pelvis with contrast:    The uterus, adnexal structures, bladder, soft tissues and the bony structures  are unremarkable for mass lesions.      Impression IMPRESSION:: Gallstones, small left renal cysts, hepatic and pulmonary  metastatic lesions     ASSESSMENT:    ICD-10-CM ICD-9-CM    1. Neoplastic malignant related fatigue R53.0 780.79    2. Cognitive deficits R41.89 294.9     3. 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 (HCC) C78.7 197.7      172.9    4. 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    Pertinent old records were reviewed from [facility].    Case discussed with Dr. Jenetta Loges.    1. Fatigue: encouraged activity as tolerated    Unsure if pt proceeded with [facility] based Palliative Care.    Advanced Care Planning Discussed: none today    Will follow up as needed.    All questions were  asked and answered to the best of my ability.  In all, I spent 10 minutes in the care of Ms. Sirois today, over 50% of which was in direct counseling and coordination of care about pain and symptom management, home health services.        Ashley Royalty, NP  Outpatient Palliative Care at the  [facility]  [address] [place] [address]  Office : [phone]  Fax : [phone]

## 2017-04-03 ENCOUNTER — Other Ambulatory Visit: Payer: Self-pay | Admitting: Physician Assistant

## 2017-04-05 ENCOUNTER — Encounter: Primary: Family Medicine

## 2017-04-07 ENCOUNTER — Encounter: Primary: Family Medicine

## 2017-04-12 ENCOUNTER — Encounter: Primary: Family Medicine

## 2017-04-14 ENCOUNTER — Ambulatory Visit
Admit: 2017-04-14 | Discharge: 2017-04-14 | Payer: PRIVATE HEALTH INSURANCE | Attending: Family Medicine | Primary: Family Medicine

## 2017-04-14 DIAGNOSIS — I10 Essential (primary) hypertension: Secondary | ICD-10-CM

## 2017-04-14 MED ORDER — SPIRONOLACTONE 25 MG TAB
25 mg | ORAL_TABLET | Freq: Every day | ORAL | 6 refills | Status: DC
Start: 2017-04-14 — End: 2017-04-20

## 2017-04-14 NOTE — Progress Notes (Signed)
Katelyn [name] is a 61 y.o. female who presents with   Chief Complaint   Patient presents with   ??? Hypertension     does not check BP at home, sometimes high at other providers   ??? Cholesterol Problem     fasting if labs needed today       History of Present Illness    Pt currently being treated for metastatic melanoma to brain, lungs, liver.  Episodic aphasia and has had seizures.  Stable recently. BP running high.  Mild swelling.  On Decadron.  K has run low.     Review of Systems  Review of Systems   Constitutional: Negative for chills, fever and malaise/fatigue.   HENT: Negative for congestion and hearing loss.    Eyes: Negative for blurred vision, pain and discharge.   Respiratory: Negative for cough, hemoptysis, shortness of breath and wheezing.    Cardiovascular: Negative for chest pain, palpitations and leg swelling.   Gastrointestinal: Negative for abdominal pain, blood in stool, constipation, diarrhea, heartburn and nausea.   Genitourinary: Negative for dysuria, frequency, hematuria and urgency.   Musculoskeletal: Negative for joint pain and myalgias.   Skin: Negative for rash.   Neurological: Negative for dizziness, sensory change and headaches.   Endo/Heme/Allergies: Negative for polydipsia. Does not bruise/bleed easily.   Psychiatric/Behavioral: Negative for depression. The patient is not nervous/anxious and does not have insomnia.        Medications  Current Outpatient Prescriptions   Medication Sig   ??? dexamethasone (DECADRON) 4 mg tablet Take 4 mg by mouth four (4) times daily.   ??? spironolactone (ALDACTONE) 25 mg tablet Take 1 Tab by mouth daily.   ??? OTHER,NON-FORMULARY, Handicap Placard   ??? traMADol (ULTRAM) 50 mg tablet Take 1 Tab by mouth every six (6) hours as needed for Pain. Max Daily Amount: 200 mg. Indications: Pain   ??? losartan (COZAAR) 100 mg tablet Take 100 mg by mouth.   ??? levETIRAcetam (KEPPRA) 500 mg tablet Take 1 Tab by mouth two (2) times a day.    ??? LORazepam (ATIVAN) 1 mg tablet Take 1 Tab by mouth nightly as needed (insomnia). Max Daily Amount: 1 mg.   ??? dabrafenib (TAFINLAR) 75 mg cap Take 2 Caps by mouth two (2) times a day.   ??? trametinib (MEKINIST) 2 mg tab Take 1 Tab by mouth daily.   ??? simvastatin (ZOCOR) 20 mg tablet Take 1 Tab by mouth nightly.   ??? amLODIPine (NORVASC) 10 mg tablet Take 1 Tab by mouth nightly.   ??? fluticasone (FLONASE) 50 mcg/actuation nasal spray 2 Sprays by Both Nostrils route nightly.   ??? cetirizine (ZYRTEC) 10 mg tablet Take  by mouth.   ??? ibuprofen (MOTRIN) 200 mg tablet Take 200 mg by mouth every six (6) hours as needed for Pain.   ??? acetaminophen (TYLENOL) 325 mg tablet Take 650 mg by mouth.     No current facility-administered medications for this visit.        Past Medical History  Past Medical History:   Diagnosis Date   ??? Cancer ([place])     melanoma - back   ??? Hypercholesterolemia    ??? Hypertension     managed with meds   ??? Nausea & vomiting    ??? Osteopenia        Surgical History  Past Surgical History:   Procedure Laterality Date   ??? HX COLONOSCOPY     ??? HX GYN  c-section x 5   ??? HX ORTHOPAEDIC Left 04/30/2016    knee   ??? HX OTHER SURGICAL      melanoma removed from back          Family History  Family History   Problem Relation Age of Onset   ??? Hypertension Mother    ??? Elevated Lipids Mother    ??? Elevated Lipids Father    ??? Hypertension Father    ??? Arthritis-osteo Father      spine   ??? Heart Disease Father    ??? Breast Cancer Other    ??? Diabetes Sister        Social History  Social History     Social History   ??? Marital status: MARRIED     Spouse name: N/A   ??? Number of children: N/A   ??? Years of education: N/A     Occupational History   ??? Not on file.     Social History Main Topics   ??? Smoking status: Never Smoker   ??? Smokeless tobacco: Never Used   ??? Alcohol use No   ??? Drug use: Not on file   ??? Sexual activity: Not on file     Other Topics Concern   ??? Not on file     Social History Narrative       History    Smoking Status   ??? Never Smoker   Smokeless Tobacco   ??? Never Used       Allergies  Allergies   Allergen Reactions   ??? Adhesive Tape-Silicones Rash       Vital Signs  Body mass index is 22.4 kg/(m^2).  Vitals:    04/14/17 0915   BP: (!) 156/98   Pulse: 78   SpO2: 96%   Weight: 143 lb (64.9 kg)       Physical Exam  Physical Exam   Constitutional: She is well-developed, well-nourished, and in no distress.   HENT:   Right Ear: Tympanic membrane normal.   Left Ear: Tympanic membrane normal.   Mouth/Throat: No oropharyngeal exudate.   Neck: No thyromegaly present.   Cardiovascular: Normal rate, regular rhythm, normal heart sounds and normal pulses.  Exam reveals no gallop and no friction rub.    No murmur heard.  Pulmonary/Chest: Breath sounds normal. No respiratory distress. She has no wheezes. She has no rales.   Musculoskeletal: She exhibits no edema.   Lymphadenopathy:     She has no cervical adenopathy.   Skin: Skin is warm and dry.       Assessment and Plan  Diagnoses and all orders for this visit:    1. Essential hypertension    2. Memory difficulties    3. Hypokalemia  -     BMP  -     Venipuncture    Other orders  -     spironolactone (ALDACTONE) 25 mg tablet; Take 1 Tab by mouth daily.    recheck in 1 mo  Cont oncology f/u

## 2017-04-15 ENCOUNTER — Encounter: Primary: Family Medicine

## 2017-04-15 LAB — METABOLIC PANEL, BASIC
BUN/Creatinine ratio: 26 (ref 12–28)
BUN: 18 mg/dL (ref 8–27)
CO2: 27 mmol/L (ref 18–29)
Calcium: 8.4 mg/dL — ABNORMAL LOW (ref 8.7–10.3)
Chloride: 96 mmol/L (ref 96–106)
Creatinine: 0.69 mg/dL (ref 0.57–1.00)
GFR est AA: 109 mL/min/{1.73_m2} (ref >59)
GFR est non-AA: 95 mL/min/{1.73_m2} (ref >59)
Glucose: 124 mg/dL — ABNORMAL HIGH (ref 65–99)
Potassium: 3.2 mmol/L — ABNORMAL LOW (ref 3.5–5.2)
Sodium: 137 mmol/L (ref 134–144)

## 2017-04-15 NOTE — Progress Notes (Signed)
LMTC.

## 2017-04-16 MED ORDER — TRAMETINIB 2 MG TABLET
2 mg | ORAL_TABLET | Freq: Every day | ORAL | 3 refills | Status: DC
Start: 2017-04-16 — End: 2017-04-20

## 2017-04-16 MED ORDER — DABRAFENIB 75 MG CAPSULE
75 mg | ORAL_CAPSULE | Freq: Two times a day (BID) | ORAL | 3 refills | Status: DC
Start: 2017-04-16 — End: 2017-04-20

## 2017-04-16 NOTE — Progress Notes (Signed)
Patient notified. She asked me to contact husband to schedule appt.  Mier on husband's cell.

## 2017-04-17 ENCOUNTER — Inpatient Hospital Stay: Admit: 2017-04-18 | Payer: PRIVATE HEALTH INSURANCE | Primary: Family Medicine

## 2017-04-17 ENCOUNTER — Emergency Department: Admit: 2017-04-17 | Payer: PRIVATE HEALTH INSURANCE | Primary: Family Medicine

## 2017-04-17 ENCOUNTER — Inpatient Hospital Stay
Admit: 2017-04-17 | Discharge: 2017-04-17 | Disposition: A | Discharge status: Other Acute Inpatient Hospital | Payer: PRIVATE HEALTH INSURANCE | Attending: Internal Medicine | Admitting: Internal Medicine

## 2017-04-17 ENCOUNTER — Emergency Department: Payer: PRIVATE HEALTH INSURANCE | Primary: Family Medicine

## 2017-04-17 DIAGNOSIS — G40109 Localization-related (focal) (partial) symptomatic epilepsy and epileptic syndromes with simple partial seizures, not intractable, without status epilepticus: Secondary | ICD-10-CM

## 2017-04-17 LAB — TROPONIN I
Troponin-I, Qt.: 1.26 NG/ML — CR (ref 0.02–0.05)
Troponin-I, Qt.: 1.74 NG/ML — CR (ref 0.02–0.05)

## 2017-04-17 LAB — BLOOD GAS, ARTERIAL
ALLENS TEST: POSITIVE
Arterial O2 Hgb: 97.5 % — ABNORMAL HIGH (ref 94–97)
BASE DEFICIT: 19.6 mmol/L — ABNORMAL HIGH (ref 0–2)
BASE DEFICIT: 10.9 mmol/L — ABNORMAL HIGH (ref 0–2)
BICARBONATE: 7 mmol/L — ABNORMAL LOW (ref 22–26)
BICARBONATE: 13 mmol/L — ABNORMAL LOW (ref 22–26)
CARBOXYHEMOGLOBIN: 0 % — ABNORMAL LOW (ref 0.5–1.5)
DEOXYHEMOGLOBIN: 2 % (ref 0.0–5.0)
METHEMOGLOBIN: 0.3 % (ref 0.0–1.5)
O2 FLOW: 15 L/min
O2 FLOW: 9 L/min
O2 SAT: 98 % (ref 92–98.5)
PCO2: 22 mmHg — ABNORMAL LOW (ref 35–45)
PCO2: 26 mmHg — ABNORMAL LOW (ref 35–45)
PO2: 147 mmHg — ABNORMAL HIGH (ref 80–105)
PO2: 104 mmHg (ref 80–105)
TOTAL HEMOGLOBIN: 14.8 GM/DL (ref 11.7–15.0)
pH: 7.15 — ABNORMAL LOW (ref 7.35–7.45)
pH: 7.33 — ABNORMAL LOW (ref 7.35–7.45)

## 2017-04-17 LAB — GLUCOSE, POC
Glucose (POC): 336 mg/dL — ABNORMAL HIGH (ref 65–100)
Glucose (POC): 392 mg/dL — ABNORMAL HIGH (ref 65–100)

## 2017-04-17 LAB — CBC WITH AUTOMATED DIFF
ABS. LYMPHOCYTES: 5.5 10*3/uL — ABNORMAL HIGH (ref 0.5–4.6)
ABS. MONOCYTES: 0.4 10*3/uL (ref 0.1–1.3)
ABS. NEUTROPHILS: 7.7 10*3/uL (ref 1.7–8.2)
HCT: 47.1 % — ABNORMAL HIGH (ref 35.8–46.3)
HGB: 14.6 g/dL (ref 11.7–15.4)
LYMPHOCYTES: 40 % (ref 16–44)
MCH: 29 PG (ref 26.1–32.9)
MCHC: 31 g/dL — ABNORMAL LOW (ref 31.4–35.0)
MCV: 93.6 FL (ref 79.6–97.8)
MONOCYTES: 3 % (ref 3–9)
MPV: 9.4 FL — ABNORMAL LOW (ref 10.8–14.1)
NEUTROPHILS: 57 % (ref 47–75)
NRBC: 2 PER 100 WBC
PLATELET COMMENTS: ADEQUATE
PLATELET: 212 10*3/uL (ref 150–450)
RBC: 5.03 M/uL (ref 4.05–5.25)
RDW: 17.2 % — ABNORMAL HIGH (ref 11.9–14.6)
WBC: 13.6 10*3/uL — ABNORMAL HIGH (ref 4.3–11.1)

## 2017-04-17 LAB — METABOLIC PANEL, COMPREHENSIVE
A-G Ratio: 0.8 — ABNORMAL LOW (ref 1.2–3.5)
ALT (SGPT): 42 U/L (ref 12–65)
AST (SGOT): 31 U/L (ref 15–37)
Albumin: 2.9 g/dL — ABNORMAL LOW (ref 3.2–4.6)
Alk. phosphatase: 90 U/L (ref 50–136)
Anion gap: 21 mmol/L — ABNORMAL HIGH (ref 7–16)
BUN: 20 MG/DL (ref 8–23)
Bilirubin, total: 0.4 MG/DL (ref 0.2–1.1)
CO2: 21 mmol/L (ref 21–32)
Calcium: 9.1 MG/DL (ref 8.3–10.4)
Chloride: 99 mmol/L (ref 98–107)
Creatinine: 1.51 MG/DL — ABNORMAL HIGH (ref 0.6–1.0)
GFR est AA: 45 mL/min/{1.73_m2} — ABNORMAL LOW (ref >60)
GFR est non-AA: 37 mL/min/{1.73_m2} — ABNORMAL LOW (ref >60)
Globulin: 3.8 g/dL — ABNORMAL HIGH (ref 2.3–3.5)
Glucose: 384 mg/dL — ABNORMAL HIGH (ref 65–100)
Potassium: 3.6 mmol/L (ref 3.5–5.1)
Protein, total: 6.7 g/dL (ref 6.3–8.2)
Sodium: 141 mmol/L (ref 136–145)

## 2017-04-17 LAB — MAGNESIUM: Magnesium: 2.5 mg/dL — ABNORMAL HIGH (ref 1.8–2.4)

## 2017-04-17 LAB — PROTHROMBIN TIME + INR
INR: 1
Prothrombin time: 12.8 s (ref 11.5–14.5)

## 2017-04-17 LAB — LACTIC ACID: Lactic acid: 9.8 MMOL/L — CR (ref 0.4–2.0)

## 2017-04-17 LAB — CK: CK: 83 U/L (ref 21–215)

## 2017-04-17 LAB — PTT: aPTT: 26.1 s (ref 23.2–35.3)

## 2017-04-17 MED ORDER — LEVETIRACETAM 500 MG/5 ML IV SOLN
500 mg/5 mL | Freq: Two times a day (BID) | INTRAVENOUS | Status: DC
Start: 2017-04-17 — End: 2017-04-17

## 2017-04-17 MED ORDER — METOPROLOL TARTRATE 5 MG/5 ML IV SOLN
5 mg/ mL | INTRAVENOUS | Status: DC | PRN
Start: 2017-04-17 — End: 2017-04-17
  Administered 2017-04-17: 20:00:00 via INTRAVENOUS

## 2017-04-17 MED ORDER — SODIUM CHLORIDE 0.9% BOLUS IV
0.9 % | Freq: Once | INTRAVENOUS | Status: AC
Start: 2017-04-17 — End: 2017-04-17
  Administered 2017-04-17: 17:00:00 via INTRAVENOUS

## 2017-04-17 MED ORDER — SODIUM CHLORIDE 0.9 % IV
500 mg/5 mL | Freq: Two times a day (BID) | INTRAVENOUS | Status: DC
Start: 2017-04-17 — End: 2017-04-17

## 2017-04-17 MED ORDER — DEXTROMETHORPHAN-GUAIFENESIN 10 MG-100 MG/5 ML SYRUP
100-10 mg/5 mL | ORAL | Status: DC | PRN
Start: 2017-04-17 — End: 2017-04-17

## 2017-04-17 MED ORDER — IOPAMIDOL 76 % IV SOLN
370 mg iodine /mL (76 %) | Freq: Once | INTRAVENOUS | Status: AC
Start: 2017-04-17 — End: 2017-04-17
  Administered 2017-04-17: 18:00:00 via INTRAVENOUS

## 2017-04-17 MED ORDER — HEPARIN (PORCINE) IN D5W 25,000 UNIT/500 ML IV
25000 unit/500 mL (50 unit/mL) | INTRAVENOUS | Status: DC
Start: 2017-04-17 — End: 2017-04-17
  Administered 2017-04-17: 20:00:00 via INTRAVENOUS

## 2017-04-17 MED ORDER — LORAZEPAM 1 MG TAB
1 mg | ORAL | Status: DC | PRN
Start: 2017-04-17 — End: 2017-04-17

## 2017-04-17 MED ORDER — SODIUM CHLORIDE 0.9 % IJ SYRG
Freq: Three times a day (TID) | INTRAMUSCULAR | Status: DC
Start: 2017-04-17 — End: 2017-04-17
  Administered 2017-04-17: 19:00:00 via INTRAVENOUS

## 2017-04-17 MED ORDER — BISACODYL 5 MG TAB, DELAYED RELEASE
5 mg | Freq: Every day | ORAL | Status: DC | PRN
Start: 2017-04-17 — End: 2017-04-17

## 2017-04-17 MED ORDER — ENOXAPARIN 40 MG/0.4 ML SUB-Q SYRINGE
40 mg/0.4 mL | SUBCUTANEOUS | Status: DC
Start: 2017-04-17 — End: 2017-04-17

## 2017-04-17 MED ORDER — DEXTROSE 40 % ORAL GEL
40 % | ORAL | Status: DC | PRN
Start: 2017-04-17 — End: 2017-04-17

## 2017-04-17 MED ORDER — SODIUM CHLORIDE 0.9 % IV
5005 mg/5 mL | Freq: Two times a day (BID) | INTRAVENOUS | Status: DC
Start: 2017-04-17 — End: 2017-04-17

## 2017-04-17 MED ORDER — POLYETHYLENE GLYCOL 3350 17 GRAM (100 %) ORAL POWDER PACKET
17 gram | Freq: Every day | ORAL | Status: DC | PRN
Start: 2017-04-17 — End: 2017-04-17

## 2017-04-17 MED ORDER — LORAZEPAM 2 MG/ML IJ SOLN
2 mg/mL | INTRAMUSCULAR | Status: AC
Start: 2017-04-17 — End: 2017-04-17
  Administered 2017-04-17: 17:00:00 via INTRAVENOUS

## 2017-04-17 MED ORDER — DEXAMETHASONE SODIUM PHOSPHATE 4 MG/ML IJ SOLN
4 mg/mL | Freq: Four times a day (QID) | INTRAMUSCULAR | Status: DC
Start: 2017-04-17 — End: 2017-04-17
  Administered 2017-04-17: 22:00:00 via INTRAVENOUS

## 2017-04-17 MED ORDER — SALINE PERIPHERAL FLUSH PRN
Freq: Once | INTRAMUSCULAR | Status: AC
Start: 2017-04-17 — End: 2017-04-17
  Administered 2017-04-17: 18:00:00

## 2017-04-17 MED ORDER — DEXTROSE 50% IN WATER (D50W) IV SYRG
INTRAVENOUS | Status: DC | PRN
Start: 2017-04-17 — End: 2017-04-17

## 2017-04-17 MED ORDER — SODIUM CHLORIDE 0.9 % IV
Freq: Once | INTRAVENOUS | Status: AC
Start: 2017-04-17 — End: 2017-04-17
  Administered 2017-04-17: 22:00:00 via INTRAVENOUS

## 2017-04-17 MED ORDER — ONDANSETRON (PF) 4 MG/2 ML INJECTION
4 mg/2 mL | INTRAMUSCULAR | Status: DC | PRN
Start: 2017-04-17 — End: 2017-04-17

## 2017-04-17 MED ORDER — INSULIN LISPRO 100 UNIT/ML INJECTION
100 unit/mL | Freq: Once | SUBCUTANEOUS | Status: DC
Start: 2017-04-17 — End: 2017-04-17
  Administered 2017-04-17: 22:00:00 via SUBCUTANEOUS

## 2017-04-17 MED ORDER — NOREPINEPHRINE BITARTRATE 4 MG/250 ML (16 MCG/ML) IN DEXTROSE 5 % IV
4 mg/250 mL (16 mcg/mL) | INTRAVENOUS | Status: DC
Start: 2017-04-17 — End: 2017-04-17

## 2017-04-17 MED ORDER — IBUPROFEN 800 MG TAB
800 mg | Freq: Four times a day (QID) | ORAL | Status: DC | PRN
Start: 2017-04-17 — End: 2017-04-17

## 2017-04-17 MED ORDER — SODIUM CHLORIDE 0.9 % IJ SYRG
INTRAMUSCULAR | Status: DC | PRN
Start: 2017-04-17 — End: 2017-04-17

## 2017-04-17 MED ORDER — DEXTROSE 5% IN WATER (D5W) IV
1 mg/mL | INTRAVENOUS | Status: DC
Start: 2017-04-17 — End: 2017-04-17

## 2017-04-17 MED ORDER — SODIUM CHLORIDE 0.9 % IV
INTRAVENOUS | Status: DC
Start: 2017-04-17 — End: 2017-04-17
  Administered 2017-04-17: 19:00:00 via INTRAVENOUS

## 2017-04-17 MED ORDER — HYDROCODONE-ACETAMINOPHEN 10 MG-325 MG TAB
10-325 mg | ORAL | Status: DC | PRN
Start: 2017-04-17 — End: 2017-04-17

## 2017-04-17 MED ORDER — SODIUM CHLORIDE 0.9 % IV
5005 mg/5 mL | Freq: Once | INTRAVENOUS | Status: AC
Start: 2017-04-17 — End: 2017-04-17
  Administered 2017-04-17: 17:00:00 via INTRAVENOUS

## 2017-04-17 MED ORDER — AZITHROMYCIN 500 MG IN NS 250 ML
500 mg/250 mL | INTRAVENOUS | Status: DC
Start: 2017-04-17 — End: 2017-04-17
  Administered 2017-04-17: 20:00:00 via INTRAVENOUS

## 2017-04-17 MED ORDER — SODIUM CHLORIDE 0.9 % IV PIGGY BACK
4.5 gram | Freq: Three times a day (TID) | INTRAVENOUS | Status: DC
Start: 2017-04-17 — End: 2017-04-17

## 2017-04-17 MED ORDER — GLUCAGON 1 MG INJECTION
1 mg | INTRAMUSCULAR | Status: DC | PRN
Start: 2017-04-17 — End: 2017-04-17

## 2017-04-17 MED ORDER — INSULIN LISPRO 100 UNIT/ML INJECTION
100 unit/mL | Freq: Four times a day (QID) | SUBCUTANEOUS | Status: DC
Start: 2017-04-17 — End: 2017-04-17

## 2017-04-17 MED ORDER — HYDROCODONE-ACETAMINOPHEN 5 MG-325 MG TAB
5-325 mg | ORAL | Status: DC | PRN
Start: 2017-04-17 — End: 2017-04-17

## 2017-04-17 MED ORDER — NOREPINEPHRINE BITARTRATE 4 MG/250 ML (16 MCG/ML) IN DEXTROSE 5 % IV
4 mg/250 mL (16 mcg/mL) | INTRAVENOUS | Status: AC
Start: 2017-04-17 — End: 2017-04-17
  Administered 2017-04-17: 23:00:00 via INTRAVENOUS

## 2017-04-17 MED ORDER — HYDRALAZINE 20 MG/ML IJ SOLN
20 mg/mL | Freq: Four times a day (QID) | INTRAMUSCULAR | Status: DC | PRN
Start: 2017-04-17 — End: 2017-04-17

## 2017-04-17 MED ORDER — LORAZEPAM 2 MG/ML IJ SOLN
2 mg/mL | INTRAMUSCULAR | Status: AC
Start: 2017-04-17 — End: 2017-04-17
  Administered 2017-04-17: 16:00:00 via INTRAVENOUS

## 2017-04-17 MED ORDER — .PHARMACY TO SUBSTITUTE PER PROTOCOL
Status: DC | PRN
Start: 2017-04-17 — End: 2017-04-17

## 2017-04-17 MED ORDER — DABRAFENIB 75 MG CAPSULE
75 mg | Freq: Two times a day (BID) | ORAL | Status: DC
Start: 2017-04-17 — End: 2017-04-17

## 2017-04-17 MED ORDER — HEPARIN (PORCINE) 5,000 UNIT/ML IJ SOLN
5000 unit/mL | Freq: Once | INTRAMUSCULAR | Status: AC
Start: 2017-04-17 — End: 2017-04-17
  Administered 2017-04-17: 20:00:00 via INTRAVENOUS

## 2017-04-17 MED ORDER — SODIUM CHLORIDE 0.9 % IV
500 mg PE/10 mL | INTRAVENOUS | Status: DC
Start: 2017-04-17 — End: 2017-04-17

## 2017-04-17 MED ORDER — ZOLPIDEM 5 MG TAB
5 mg | Freq: Every evening | ORAL | Status: DC | PRN
Start: 2017-04-17 — End: 2017-04-17

## 2017-04-17 MED ORDER — ACETAMINOPHEN 325 MG TABLET
325 mg | ORAL | Status: DC | PRN
Start: 2017-04-17 — End: 2017-04-17

## 2017-04-17 MED ORDER — SIMVASTATIN 20 MG TAB
20 mg | Freq: Every evening | ORAL | Status: DC
Start: 2017-04-17 — End: 2017-04-17

## 2017-04-17 MED ORDER — TRAMETINIB 2 MG TABLET
2 mg | Freq: Every day | ORAL | Status: DC
Start: 2017-04-17 — End: 2017-04-17

## 2017-04-17 MED ORDER — SENNOSIDES-DOCUSATE SODIUM 8.6 MG-50 MG TAB
Freq: Every day | ORAL | Status: DC | PRN
Start: 2017-04-17 — End: 2017-04-17

## 2017-04-17 MED ORDER — TUBERCULIN PPD 5 UNIT/0.1 ML INTRADERMAL
5 tub. unit /0.1 mL | Freq: Once | INTRADERMAL | Status: DC
Start: 2017-04-17 — End: 2017-04-17

## 2017-04-17 MED ORDER — LEVETIRACETAM 250 MG TAB
250 mg | Freq: Two times a day (BID) | ORAL | Status: DC
Start: 2017-04-17 — End: 2017-04-17

## 2017-04-17 MED ORDER — ENOXAPARIN 120 MG/0.8 ML SUB-Q SYRINGE
120 mg/0.8 mL | Freq: Two times a day (BID) | SUBCUTANEOUS | Status: DC
Start: 2017-04-17 — End: 2017-04-17

## 2017-04-17 MED ORDER — DIPHENHYDRAMINE HCL 50 MG/ML IJ SOLN
50 mg/mL | INTRAMUSCULAR | Status: DC | PRN
Start: 2017-04-17 — End: 2017-04-17

## 2017-04-17 MED ORDER — DEXAMETHASONE 4 MG TAB
4 mg | Freq: Four times a day (QID) | ORAL | Status: DC
Start: 2017-04-17 — End: 2017-04-17

## 2017-04-17 MED ORDER — SODIUM CHLORIDE 0.9 % IV PIGGY BACK
1 gram | INTRAVENOUS | Status: DC
Start: 2017-04-17 — End: 2017-04-17
  Administered 2017-04-17: 19:00:00 via INTRAVENOUS

## 2017-04-17 MED ORDER — DILTIAZEM HCL 5 MG/ML IV SOLN
5 mg/mL | Freq: Once | INTRAVENOUS | Status: DC
Start: 2017-04-17 — End: 2017-04-17

## 2017-04-17 MED ORDER — SODIUM CHLORIDE 0.9% BOLUS IV
0.9 % | Freq: Once | INTRAVENOUS | Status: AC
Start: 2017-04-17 — End: 2017-04-17
  Administered 2017-04-17: 18:00:00 via INTRAVENOUS

## 2017-04-17 MED ORDER — NALOXONE 0.4 MG/ML INJECTION
0.4 mg/mL | INTRAMUSCULAR | Status: DC | PRN
Start: 2017-04-17 — End: 2017-04-17

## 2017-04-17 MED FILL — LORAZEPAM 2 MG/ML IJ SOLN: 2 mg/mL | INTRAMUSCULAR | Qty: 1

## 2017-04-17 MED FILL — LEVETIRACETAM 500 MG/5 ML IV SOLN: 500 mg/5 mL | INTRAVENOUS | Qty: 7.5

## 2017-04-17 MED FILL — SODIUM CHLORIDE 0.9 % IV: INTRAVENOUS | Qty: 1000

## 2017-04-17 MED FILL — DEXAMETHASONE SODIUM PHOSPHATE 4 MG/ML IJ SOLN: 4 mg/mL | INTRAMUSCULAR | Qty: 1

## 2017-04-17 MED FILL — HEPARIN (PORCINE) 5,000 UNIT/ML IJ SOLN: 5000 unit/mL | INTRAMUSCULAR | Qty: 2

## 2017-04-17 MED FILL — LEVETIRACETAM 500 MG/5 ML IV SOLN: 500 mg/5 mL | INTRAVENOUS | Qty: 10

## 2017-04-17 MED FILL — NOREPINEPHRINE BITARTRATE 4 MG/250 ML (16 MCG/ML) IN DEXTROSE 5 % IV: 4 mg/250 mL (16 mcg/mL) | INTRAVENOUS | Qty: 250

## 2017-04-17 MED FILL — FOSPHENYTOIN 500 MG PE/10 ML INJECTION: 500 mg PE/10 mL | INTRAMUSCULAR | Qty: 25

## 2017-04-17 MED FILL — AZITHROMYCIN 500 MG IN NS 250 ML: 500 mg/250 mL | INTRAVENOUS | Qty: 250

## 2017-04-17 MED FILL — CEFTRIAXONE 1 GRAM SOLUTION FOR INJECTION: 1 gram | INTRAMUSCULAR | Qty: 1

## 2017-04-17 MED FILL — METOPROLOL TARTRATE 5 MG/5 ML IV SOLN: 5 mg/ mL | INTRAVENOUS | Qty: 5

## 2017-04-17 MED FILL — HEPARIN (PORCINE) IN D5W 25,000 UNIT/500 ML IV: 25000 unit/500 mL (50 unit/mL) | INTRAVENOUS | Qty: 500

## 2017-04-17 MED FILL — NOREPINEPHRINE BITARTRATE 1 MG/ML IV: 1 mg/mL | INTRAVENOUS | Qty: 8

## 2017-04-17 MED FILL — DILTIAZEM HCL 5 MG/ML IV SOLN: 5 mg/mL | INTRAVENOUS | Qty: 5

## 2017-04-17 NOTE — Consults (Signed)
Consults  by Waverly Ferrari, MD at 04/17/17 1945                Author: Waverly Ferrari, MD  Service: Pulmonary Disease  Author Type: Physician       Filed: 04/17/17 2045  Date of Service: 04/17/17 1945  Status: Signed          Editor: Waverly Ferrari, MD (Physician)                                         CONSULT NOTE      [name]      04/17/2017      Date of Admission:  04/17/2017      The patient's chart is reviewed and the patient is discussed with the staff.        Subjective:        The patient is a 61 y.o. Caucasian female  seen and evaluated at the request of Dr. Quillian Quince and per the ICU/CCU [place] Protocol.      She has a history of Stage 4 Malignant melanoma with brain, liver, and lung involvement. She was doing reasonably well until this AM when she suddenly developed seizure activity with weakness. She had multiple recurrences. She presented to [name] and was  initially managed in the ICU. A CT of the chest was done in the course of her evaluation revealing extensive pulmonary emboli for which she is now on a heparin drip. IR was consulted and felt she was not a candidate for EKOS or any procedure at present.  She is currently off levophed.. 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      On arrival the patient has been noted to have grossly bloody stool.       He husband reports that prior to this AM she had no issue with cough, chest pain, dyspnea, fever, or dysuria.          Review of Systems      Denies: fevers, chills, sweats,  anorexia, weight loss    Denies: blurry vision, loss of vision, eye pain, photophobia   Denies: hearing loss, ringing in the ears, earache, epistaxis   Denies: chest pain, palpitations, syncope, orthopnea, paroxysmal nocturnal dyspnea, claudication   Denies: dysphagia, odynophagia, nausea, vomiting, diarrhea, constipation, abdominal pain, jaundice,    Denies: frequency, dysuria, nocturia, urinary incontinence, stones, hematuria    Denies: polydipsia/polyuria, skin changes, temperature intolerance, unexpected weight gain   Denies: back pain, joint pain, joint swelling, muscle pain, muscle weakness   Denies: bleeding problems, blood transfusions, bruising, pallor, swollen lymph nodes   Denies: headache, dysarthria, blurred vision, diplopia, focal deficits.      Admits to: fatigue, malaise, seizure, melena now noted.                      Patient Active Problem List        Diagnosis  Code         ?  Osteopenia  M85.80     ?  HTN (hypertension)  I10     ?  Hyperlipidemia  E78.5     ?  Liver lesion  K76.9     ?  Secondary malignant melanoma of brain ([place])  C79.31     ?  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HCC)  I61.9     ?  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  Memory difficulties  R41.3     ?  Aphasia  R47.01     ?  Seizure ([place])  R56.9     ?  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 (HCC)  C78.7     ?  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 (HCC)  C78.00     ?  AKI (acute kidney injury) ([place])  N17.9     ?  Demand ischemia (HCC)  I24.8     ?  Brain edema (HCC)  G93.6     ?  Acute respiratory failure (HCC)  J96.00     ?  SIRS (systemic inflammatory response syndrome) (HCC)  R65.10     ?  Acute pulmonary embolism (HCC)  I26.99     ?  Shock ([place])  R57.9         ?  Lactic acidosis  E87.2                   Prior to Admission Medications     Prescriptions  Last Dose  Informant  Patient Reported?  Taking?      LORazepam (ATIVAN) 1 mg tablet      No  No      Sig: Take 1 Tab by mouth nightly as needed (insomnia). Max Daily Amount: 1 mg.      OTHER,NON-FORMULARY,      No  No      Sig: Handicap Placard      acetaminophen (TYLENOL) 325 mg tablet      Yes  No      Sig: Take 650 mg by mouth.      amLODIPine (NORVASC) 10 mg tablet      No  No      Sig: Take 1 Tab by mouth nightly.      cetirizine (ZYRTEC) 10 mg tablet      Yes  No      Sig: Take  by mouth.      dabrafenib (TAFINLAR) 75 mg cap      No  No      Sig: Take 2 Caps by mouth two (2) times a  day.      dexamethasone (DECADRON) 4 mg tablet      Yes  No      Sig: Take 4 mg by mouth four (4) times daily.      fluticasone (FLONASE) 50 mcg/actuation nasal spray      Yes  No      Sig: 2 Sprays by Both Nostrils route nightly.      ibuprofen (MOTRIN) 200 mg tablet      Yes  No      Sig: Take 200 mg by mouth every six (6) hours as needed for Pain.      levETIRAcetam (KEPPRA) 500 mg tablet      No  No      Sig: Take 1 Tab by mouth two (2) times a day.      losartan (COZAAR) 100 mg tablet      Yes  No      Sig: Take 100 mg by mouth.      simvastatin (ZOCOR) 20 mg tablet      No  No      Sig: Take 1 Tab by mouth nightly.      spironolactone (ALDACTONE) 25 mg tablet      No  No      Sig: Take  1 Tab by mouth daily.      traMADol (ULTRAM) 50 mg tablet      No  No      Sig: Take 1 Tab by mouth every six (6) hours as needed for Pain. Max Daily Amount: 200 mg. Indications: Pain      trametinib (MEKINIST) 2 mg tab      No  No      Sig: Take 1 Tab by mouth daily.               Facility-Administered Medications: None             Past Medical History:        Diagnosis  Date         ?  Cancer ([place])            melanoma - back         ?  Hypercholesterolemia       ?  Hypertension            managed with meds         ?  Nausea & vomiting           ?  Osteopenia            Past Surgical History:         Procedure  Laterality  Date          ?  HX COLONOSCOPY         ?  HX GYN              c-section x 5          ?  HX ORTHOPAEDIC  Left  04/30/2016          knee          ?  HX OTHER SURGICAL              melanoma removed from back          Social History          Social History         ?  Marital status:  MARRIED              Spouse name:  N/A         ?  Number of children:  N/A         ?  Years of education:  N/A          Occupational History        ?  Not on file.          Social History Main Topics         ?  Smoking status:  Never Smoker     ?  Smokeless tobacco:  Never Used     ?  Alcohol use  No     ?  Drug use:  Not on file          ?  Sexual activity:  Not on file           Other Topics  Concern        ?  Not on file          Social History Narrative          Family History         Problem  Relation  Age of Onset          ?  Hypertension  Mother       ?  Elevated Lipids  Mother       ?  Elevated Lipids  Father            ?  Hypertension  Father            ?  Arthritis-osteo  Father               spine          ?  Heart Disease  Father       ?  Breast Cancer  Other            ?  Diabetes  Sister            Allergies        Allergen  Reactions         ?  Adhesive Tape-Silicones  Rash             Current Facility-Administered Medications          Medication  Dose  Route  Frequency           ?  ibuprofen (MOTRIN) tablet 800 mg   800 mg  Oral  Q6H PRN     ?  simvastatin (ZOCOR) tablet 20 mg   20 mg  Oral  QHS     ?  bisacodyl (DULCOLAX) tablet 5 mg   5 mg  Oral  DAILY PRN     ?  guaiFENesin-dextromethorphan (ROBITUSSIN DM) 100-10 mg/5 mL syrup 10 mL   10 mL  Oral  Q4H PRN     ?  hydrALAZINE (APRESOLINE) 20 mg/mL injection 20 mg   20 mg  IntraVENous  Q6H PRN     ?  HYDROcodone-acetaminophen (NORCO) 5-325 mg per tablet 1 Tab   1 Tab  Oral  Q4H PRN     ?  HYDROcodone-acetaminophen (NORCO) 10-325 mg tablet 1 Tab   1 Tab  Oral  Q4H PRN     ?  polyethylene glycol (MIRALAX) packet 17 g   17 g  Oral  DAILY PRN     ?  0.9% sodium chloride infusion   125 mL/hr  IntraVENous  CONTINUOUS     ?  sodium chloride (NS) flush 5-10 mL   5-10 mL  IntraVENous  Q8H     ?  sodium chloride (NS) flush 5-10 mL   5-10 mL  IntraVENous  PRN     ?  acetaminophen (TYLENOL) tablet 650 mg   650 mg  Oral  Q4H PRN     ?  naloxone (NARCAN) injection 0.4 mg   0.4 mg  IntraVENous  PRN     ?  diphenhydrAMINE (BENADRYL) injection 25 mg   25 mg  IntraVENous  Q4H PRN           ?  ondansetron (ZOFRAN) injection 4 mg   4 mg  IntraVENous  Q4H PRN           ?  senna-docusate (PERICOLACE) 8.6-50 mg per tablet 2 Tab   2 Tab  Oral  DAILY PRN     ?  LORazepam (ATIVAN) tablet 1 mg   1 mg   Oral  Q4H PRN     ?  zolpidem (AMBIEN) tablet 5 mg   5 mg  Oral  QHS PRN     ?  dexamethasone (DECADRON) 4 mg/mL injection 4 mg   4 mg  IntraVENous  Q6H     ?  metoprolol (LOPRESSOR) injection 5 mg   5 mg  IntraVENous  Q4H PRN     ?  tuberculin injection 5 Units   5 Units  IntraDERMal  ONCE     ?  dabrafenib cap 150 mg - patient supplied    150 mg  Oral  BID     ?  [START ON 04/18/2017] trametinib tab 2 mg - patient supplied  (Patient Supplied)   2 mg  Oral  DAILY     ?  heparin 25,000 units in dextrose 500 mL infusion   18-36 Units/kg/hr  IntraVENous  TITRATE     ?  insulin lispro (HUMALOG) injection     SubCUTAneous  AC&HS     ?  dextrose 40% (GLUTOSE) oral gel 1 Tube   15 g  Oral  PRN     ?  glucagon (GLUCAGEN) injection 1 mg   1 mg  IntraMUSCular  PRN     ?  dextrose (D50W) injection syrg 12.5-25 g   25-50 mL  IntraVENous  PRN     ?  piperacillin-tazobactam (ZOSYN) 4.5 g in 0.9% sodium chloride (MBP/ADV) 100 mL   4.5 g  IntraVENous  Q8H     ?  levETIRAcetam (KEPPRA) 500 mg in 0.9% sodium chloride 100 mL IVPB   500 mg  IntraVENous  Q12H           ?  NOREPINephrine (LEVOPHED) 4 mg in 5% dextrose 250 mL infusion   2-16 mcg/min  IntraVENous  TITRATE                Objective:          Vitals:             04/17/17 1830  04/17/17 1902  04/17/17 1903  04/17/17 1918           BP:  (!) 80/62  137/80    108/80     Pulse:  100  (!) 102    98     Resp:  (!) 48    29  22     Temp:             SpO2:  93%  94%    95%     Weight:                   Height:                     PHYSICAL EXAM        Constitutional:  the patient is well developed and in no acute distress   EENMT:  Sclera clear, pupils equal, oral mucosa moist   Respiratory: fairly clear   Cardiovascular:  RRR without M,G,R   Gastrointestinal: soft and non-tender; with positive bowel sounds.   Musculoskeletal: warm without cyanosis. There is slight lower leg edema.    Skin:  no jaundice or rashes; multiple ecchymoses   Neurologic: some intermittent L sized jerks    Psychiatric:  Postictal; intermittently arouses and phonate      Chest CT:        4/28:                      FINDINGS:  There is extensive pulmonary embolism bilaterally, most marked in   the right lower lobe.  There is no definite pneumothorax.  No pathologically   enlarged lymph nodes or abnormal fluid collection is seen.  There has been   interval improvement in bilateral pulmonary parenchymal nodules since October  2017 with a residual 9 mm nodule medially at the right base.  Visualized hepatic   metastatic disease has also improved.   ??   IMPRESSION:        1.  EXTENSIVE BILATERAL PULMONARY EMBOLI.   2.  INTERVAL IMPROVEMENT IN PULMONARY PARENCHYMAL AND HEPATIC METASTATIC DISEASE   SINCE OCTOBER 2017."            Head CT       4/28:      CLINICAL HISTORY:  Seizure-like activity with no new intracranial metastases   from malignant melanoma.   ??   COMPARISON:  February 28, 2017.   ??   REPORT:   Multiple bilateral parenchymal metastases are again noted.  There is   less edema adjacent to the dominant left parietal metastasis.  No new or   enlarging masses evident.  There is no significant midline shift.  The   ventricles are normal in size and configuration, accounting for the patient's   age.  Orbits and  paranasal sinuses are clear where imaged. Bone windows   demonstrate no definite fracture or destruction.   ??   IMPRESSION      MULTIFOCAL METASTASES WITH LESS MASS EFFECT THAN ON MARCH 11.    NO NEW INTRACRANIAL ABNORMALITY IS IDENTIFIED.           Recent Labs            04/17/17    1225     WBC   13.6*     HGB   14.6     HCT   47.1*     PLT   212        INR   1.0          Recent Labs             04/17/17    1625   04/17/17    1225     NA    --    141     K    --    3.6     CL    --    99     GLU    --    384*     CO2    --    21     BUN    --    20     CREA    --    1.51*     MG    --    2.5*     CA    --    9.1     TROIQ   1.74*   1.26*     ALB    --    2.9*     TBILI    --    0.4     ALT    --    42          SGOT    --    31          Recent Labs             04/17/17    1748   04/17/17    1230     PH   7.33*   7.15*     PCO2   26*   22*     PO2   104   147*         HCO3   13*   7*  Recent Labs            04/17/17    1625        LAC   9.8*             Assessment:  (Medical Decision Making)           [name] Problems   Date Reviewed:  04/26/17                Codes  Class  Noted  POA              Seizure ([place])  ICD-10-CM: R56.9   ICD-9-CM: 780.39    04/17/2017  Yes          Multiple brain mets on CT but stable              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 [facility]) (Chronic)  ICD-10-CM: C78.7   ICD-9-CM: 197.7, 172.9    04/17/2017  Yes          Improved per CT              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 [facility]) (Chronic)  ICD-10-CM: C78.00   ICD-9-CM: 197.0, 172.9    04/17/2017  Yes          Improved per CT              AKI (acute kidney injury) ([place])  ICD-10-CM: N17.9   ICD-9-CM: 584.9    04/17/2017  Yes                        Demand ischemia ([place])  ICD-10-CM: I24.8   ICD-9-CM: 411.89    04/17/2017  Yes                        Brain edema (HCC) (Chronic)  ICD-10-CM: G93.6   ICD-9-CM: 348.5    04/17/2017  Yes          On chronic decadron              Acute respiratory failure ([place])  ICD-10-CM: J96.00   ICD-9-CM: 518.81    04/17/2017  Yes                        SIRS (systemic inflammatory response syndrome) ([place])  ICD-10-CM: R65.10   ICD-9-CM: 995.90    04/17/2017  Yes                        * (Principal)Acute pulmonary embolism ([place])  ICD-10-CM: I26.99   ICD-9-CM: 415.19    04/17/2017  Yes          Tolerating NC              Shock ([place])  ICD-10-CM: R57.9   ICD-9-CM: 785.50    04/17/2017  Yes          On low dose levophed. Acidosis, decadron use, and prior BP meds may be contributing at present.              Lactic acidosis  ICD-10-CM: E87.2   ICD-9-CM: 276.2    04/17/2017  Unknown                        Aphasia (Chronic)  ICD-10-CM: R47.01   ICD-9-CM: 784.3    02/28/2017  Yes  Secondary malignant melanoma of brain  ([place]) (Chronic)  ICD-10-CM: C79.31   ICD-9-CM: 198.3    11/18/2016  Yes                        HTN (hypertension) (Chronic)  ICD-10-CM: I10   ICD-9-CM: 401.9    08/18/2013  Yes                            Plan:  (Medical Decision Making)        Change to bicarb drip.   Add Florinef if able to place NG..   Protonix q12.   Small bore NG to R/O UGI bleed.   Serial H/H. Transfuse prn.   GI Consult. May need to stop heparin.   CVL only if she needs higher levophed or heparin stopped.   Serial ABG.   Serial labs.      --      More than 50% of the time documented was spent in face-to-face contact with the patient and in the care of the patient on the floor/unit where the patient is located.      Thank you very much for this referral.  We appreciate the opportunity to participate in this patient's care.  Will follow along with above stated plan.      Waverly Ferrari, MD

## 2017-04-17 NOTE — Consults (Signed)
Consults  by Emogene Morgan, MD at 04/17/17 2110                Author: Emogene Morgan, MD  Service: Gastroenterology  Author Type: Physician       Filed: 04/17/17 2114  Date of Service: 04/17/17 2110  Status: Signed          Editor: Emogene Morgan, MD (Physician)            Consult Orders        1. IP CONSULT TO GASTROENTEROLOGY [[id number]] ordered by Waverly Ferrari, MD at 04/17/17 2040                                 [facility] Consult Note                Referring Physician:       Consult Date: 04/17/2017      Reason for Consult: Hematochezia      History of Present Illness:  Patient is a 61 y.o.  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  Had seizure today.  Chest CT shows extensive  bilateral PE's.  Started on heparin gtt.  Had small bloody bm [facility], and another larger one here.  Not actively bleeding at present.  Has NG about to be lavaged.  Some earlier hypotension today, but now off pressors.  Patient does not give a history.   Hgb 14.6.            Past Medical History:        Diagnosis  Date         ?  Cancer ([place])            melanoma - back         ?  Hypercholesterolemia       ?  Hypertension            managed with meds         ?  Nausea & vomiting           ?  Osteopenia             Past Surgical History:         Procedure  Laterality  Date          ?  HX COLONOSCOPY         ?  HX GYN              c-section x 5          ?  HX ORTHOPAEDIC  Left  04/30/2016          knee          ?  HX OTHER SURGICAL              melanoma removed from back           Family History         Problem  Relation  Age of Onset          ?  Hypertension  Mother       ?  Elevated Lipids  Mother       ?  Elevated Lipids  Father       ?  Hypertension  Father       ?  Arthritis-osteo  Father  spine          ?  Heart Disease  Father       ?  Breast Cancer  Other            ?  Diabetes  Sister            Social  History          Occupational History        ?  Not on file.          Social History Main Topics         ?  Smoking status:  Never Smoker     ?  Smokeless tobacco:  Never Used     ?  Alcohol use  No     ?  Drug use:  Not on file         ?  Sexual activity:  Not on file           Hospital Medications:     Current Facility-Administered Medications          Medication  Dose  Route  Frequency           ?  acetaminophen (TYLENOL) suppository 650 mg   650 mg  Rectal  Q6H PRN     ?  NOREPINephrine (LEVOPHED) 4 mg in dextrose 5% 250 mL infusion   0.5-20 mcg/min  IntraVENous  TITRATE     ?  [START ON 04/18/2017] piperacillin-tazobactam (ZOSYN) 4.5 g in 0.9% sodium chloride (MBP/ADV) 100 mL   4.5 g  IntraVENous  Q8H     ?  acetaminophen (TYLENOL) tablet 650 mg   650 mg  Oral  Q4H PRN     ?  diphenhydrAMINE (BENADRYL) injection 25 mg   25 mg  IntraVENous  Q4H PRN     ?  LORazepam (ATIVAN) tablet 1 mg   1 mg  Oral  Q4H PRN     ?  naloxone (NARCAN) injection 0.4 mg   0.4 mg  IntraVENous  PRN     ?  ondansetron (ZOFRAN) injection 4 mg   4 mg  IntraVENous  Q4H PRN     ?  senna-docusate (PERICOLACE) 8.6-50 mg per tablet 2 Tab   2 Tab  Oral  DAILY PRN     ?  sodium chloride (NS) flush 5-10 mL   5-10 mL  IntraVENous  Q8H     ?  sodium chloride (NS) flush 5-10 mL   5-10 mL  IntraVENous  PRN     ?  zolpidem (AMBIEN) tablet 5 mg   5 mg  Oral  QHS PRN     ?  heparin 25,000 units in dextrose 500 mL infusion   18-36 Units/kg/hr  IntraVENous  TITRATE           ?  dextrose (D50W) injection syrg 12.5-25 g   25-50 mL  IntraVENous  PRN           ?  dextrose 40% (GLUTOSE) oral gel 1 Tube   15 g  Oral  PRN     ?  glucagon (GLUCAGEN) injection 1 mg   1 mg  IntraMUSCular  PRN     ?  insulin lispro (HUMALOG) injection     SubCUTAneous  AC&HS     ?  0.9% sodium chloride infusion   125 mL/hr  IntraVENous  CONTINUOUS     ?  bisacodyl (DULCOLAX) tablet 5 mg   5 mg  Oral  DAILY PRN     ?  [START ON 04/18/2017] dabrafenib cap 150 mg   150 mg  Oral  BID      ?  [START ON 04/18/2017] dexamethasone (DECADRON) 4 mg/mL injection 4 mg   4 mg  IntraVENous  Q6H     ?  guaiFENesin-dextromethorphan (ROBITUSSIN DM) 100-10 mg/5 mL syrup 10 mL   10 mL  Oral  Q4H PRN     ?  hydrALAZINE (APRESOLINE) 20 mg/mL injection 20 mg   20 mg  IntraVENous  Q6H PRN     ?  HYDROcodone-acetaminophen (NORCO) 5-325 mg per tablet 1 Tab   1 Tab  Oral  Q4H PRN     ?  HYDROcodone-acetaminophen (NORCO) 10-325 mg tablet 1 Tab   1 Tab  Oral  Q4H PRN     ?  ibuprofen (MOTRIN) tablet 800 mg   800 mg  Oral  Q6H PRN     ?  metoprolol (LOPRESSOR) injection 5 mg   5 mg  IntraVENous  Q4H PRN     ?  polyethylene glycol (MIRALAX) packet 17 g   17 g  Oral  DAILY PRN     ?  simvastatin (ZOCOR) tablet 20 mg   20 mg  Oral  QHS     ?  [START ON 04/18/2017] trametinib tab 2 mg   2 mg  Oral  DAILY           ?  0.9% sodium chloride infusion 250 mL   250 mL  IntraVENous  PRN           ?  [START ON 04/18/2017] levETIRAcetam (KEPPRA) 500 mg in 0.9% sodium chloride 100 mL IVPB   500 mg  IntraVENous  Q12H          Facility-Administered Medications Ordered in Other Encounters          Medication  Dose  Route  Frequency           ?  pantoprazole (PROTONIX) 40 mg in sodium chloride 0.9% 10 mL injection   40 mg  IntraVENous  Q12H     ?  sodium bicarbonate (8.4%) 150 mEq in dextrose 5% 1,000 mL infusion     IntraVENous  CONTINUOUS           ?  0.9% sodium chloride infusion 250 mL   250 mL  IntraVENous  PRN           Allergies:     Allergies        Allergen  Reactions         ?  Adhesive Tape-Silicones  Rash           Review of Systems:   A comprehensive review of systems was negative except for that written in the History of Present Illness.        Objective:        Physical Exam:   Vitals:   There were no vitals taken for this visit.      General: No acute distress.   Skin:  Extremities and face reveal no rashes. No palmer erythema. No telangiectasias on the chest wall.   HEENT: Sclerae anicteric. No oral ulcers.  No abnormal  pigmentation of the lips.  The neck is supple.   Cardiovascular: Regular rate and rhythm. No murmurs, gallops, or rubs.   Respiratory:  Comfortable breathing  With no accessory muscle use. Clear breath sounds with no wheezes, rales, or rhonchi.   GI:  Abdomen nondistended, soft, and nontender.  Normal active bowel sounds. No enlargement of the liver or spleen. No masses palpable.   Musculoskeletal:  No pitting edema of the lower legs.  Extremities have good range of motion.    Neurological:  Gross memory appears intact.  Patient is alert and oriented.   Psychiatric:  Mood appears appropriate with judgement intact.   Lymphatic:  No cervical or supraclavicular adenopathy.      Laboratory:       Recent Labs            04/17/17    1225     WBC   13.6*     RBC   5.03     HGB   14.6     HCT   47.1*        PLT   212           Recent Labs            04/17/17    1225     GLU   384*     NA   141     K   3.6     CL   99     CO2   21     BUN   20     CREA   1.51*        CA   9.1          Recent Labs             04/17/17    1625   04/17/17    1225     PTP    --    12.8     INR    --    1.0         APTT   26.1    --           Recent Labs            04/17/17    1225     SGOT   31     AP   90     ALB   2.9*        TP   6.7             Assessment:           A 61 y.o. female with melanoma  with mets to brain, liver, and lung who is seen in consultation for hematochezia in setting of anticoagulation for PE.           Plan:           If further hematochezia or if NG lavage reveals upper source will scope tonight   Otherwise will monitor   Continuing heparin gtt             Signed By:  Emogene Morgan, MD        April 17, 2017

## 2017-04-17 NOTE — Discharge Summary (Signed)
Having a difficult time getting consistent BP readings despite multiple sites and attempts.  May need arterial line if pulmonary can do that with heparin gtt running.  I think she would be better served downtown in the ICU there.   She is not a candidate for TPA given her melanoma in the brain.  have called and spoken to Dr Shellee Milo who thinks she is probably not stable enough for any procedure at this point.   She appears to be much worse than she was earlier; legs are mottled, faint blue.  ABG is better but lactate is 9.8.      Start levophed because I do not think her last BP check of 91/78 MAP 83 is accurate given her appearance and lactic acidosis.  Give another bolus also.  Received 2L in ER.  will also check TTE, and switch abx to vanc/zosyn in case of concurrent infection. may be able to stop in a few days if blood cultures return negative.     Transferring patient downtown for closer monitoring.  Confirmed full code with husband for now though he will discuss with family.  Consult oncology also; appreciate help.

## 2017-04-17 NOTE — Progress Notes (Signed)
TRANSFER - IN REPORT:    Verbal report received from [place], RN on [name]  being received from ED for routine progression of care      Report consisted of patient???s Situation, Background, Assessment and   Recommendations(SBAR).     Information from the following report(s) SBAR, Kardex, ED Summary and MAR was reviewed with the receiving nurse.    Opportunity for questions and clarification was provided.      Assessment completed upon patient???s arrival to unit and care assumed.

## 2017-04-17 NOTE — Consults (Signed)
[facility] Consult Note           Referring Physician:     Consult Date: 04/17/2017    Reason for Consult: Hematochezia    History of Present Illness:  Patient is a 61 y.o. 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  Had seizure today.  Chest CT shows extensive bilateral PE's.  Started on heparin gtt.  Had small bloody bm at [facility], and another larger one here.  Not actively bleeding at present.  Has NG about to be lavaged.  Some earlier hypotension today, but now off pressors.  Patient does not give a history.  Hgb 14.6.       Past Medical History:   Diagnosis Date   ??? Cancer ([place])     melanoma - back   ??? Hypercholesterolemia    ??? Hypertension     managed with meds   ??? Nausea & vomiting    ??? Osteopenia       Past Surgical History:   Procedure Laterality Date   ??? HX COLONOSCOPY     ??? HX GYN      c-section x 5   ??? HX ORTHOPAEDIC Left 04/30/2016    knee   ??? HX OTHER SURGICAL      melanoma removed from back      Family History   Problem Relation Age of Onset   ??? Hypertension Mother    ??? Elevated Lipids Mother    ??? Elevated Lipids Father    ??? Hypertension Father    ??? Arthritis-osteo Father      spine   ??? Heart Disease Father    ??? Breast Cancer Other    ??? Diabetes Sister      Social History     Occupational History   ??? Not on file.     Social History Main Topics   ??? Smoking status: Never Smoker   ??? Smokeless tobacco: Never Used   ??? Alcohol use No   ??? Drug use: Not on file   ??? Sexual activity: Not on file       Hospital Medications:  Current Facility-Administered Medications   Medication Dose Route Frequency   ??? acetaminophen (TYLENOL) suppository 650 mg  650 mg Rectal Q6H PRN   ??? NOREPINephrine (LEVOPHED) 4 mg in dextrose 5% 250 mL infusion  0.5-20 mcg/min IntraVENous TITRATE   ??? [START ON 04/18/2017] piperacillin-tazobactam (ZOSYN) 4.5 g in 0.9% sodium chloride (MBP/ADV) 100 mL  4.5 g IntraVENous Q8H    ??? acetaminophen (TYLENOL) tablet 650 mg  650 mg Oral Q4H PRN   ??? diphenhydrAMINE (BENADRYL) injection 25 mg  25 mg IntraVENous Q4H PRN   ??? LORazepam (ATIVAN) tablet 1 mg  1 mg Oral Q4H PRN   ??? naloxone (NARCAN) injection 0.4 mg  0.4 mg IntraVENous PRN   ??? ondansetron (ZOFRAN) injection 4 mg  4 mg IntraVENous Q4H PRN   ??? senna-docusate (PERICOLACE) 8.6-50 mg per tablet 2 Tab  2 Tab Oral DAILY PRN   ??? sodium chloride (NS) flush 5-10 mL  5-10 mL IntraVENous Q8H   ??? sodium chloride (NS) flush 5-10 mL  5-10 mL IntraVENous PRN   ??? zolpidem (AMBIEN) tablet 5 mg  5 mg Oral QHS PRN   ??? heparin 25,000 units in dextrose 500 mL infusion  18-36 Units/kg/hr IntraVENous TITRATE   ??? dextrose (D50W) injection syrg 12.5-25 g  25-50 mL IntraVENous PRN   ??? dextrose  40% (GLUTOSE) oral gel 1 Tube  15 g Oral PRN   ??? glucagon (GLUCAGEN) injection 1 mg  1 mg IntraMUSCular PRN   ??? insulin lispro (HUMALOG) injection   SubCUTAneous AC&HS   ??? 0.9% sodium chloride infusion  125 mL/hr IntraVENous CONTINUOUS   ??? bisacodyl (DULCOLAX) tablet 5 mg  5 mg Oral DAILY PRN   ??? [START ON 04/18/2017] dabrafenib cap 150 mg  150 mg Oral BID   ??? [START ON 04/18/2017] dexamethasone (DECADRON) 4 mg/mL injection 4 mg  4 mg IntraVENous Q6H   ??? guaiFENesin-dextromethorphan (ROBITUSSIN DM) 100-10 mg/5 mL syrup 10 mL  10 mL Oral Q4H PRN   ??? hydrALAZINE (APRESOLINE) 20 mg/mL injection 20 mg  20 mg IntraVENous Q6H PRN   ??? HYDROcodone-acetaminophen (NORCO) 5-325 mg per tablet 1 Tab  1 Tab Oral Q4H PRN   ??? HYDROcodone-acetaminophen (NORCO) 10-325 mg tablet 1 Tab  1 Tab Oral Q4H PRN   ??? ibuprofen (MOTRIN) tablet 800 mg  800 mg Oral Q6H PRN   ??? metoprolol (LOPRESSOR) injection 5 mg  5 mg IntraVENous Q4H PRN   ??? polyethylene glycol (MIRALAX) packet 17 g  17 g Oral DAILY PRN   ??? simvastatin (ZOCOR) tablet 20 mg  20 mg Oral QHS   ??? [START ON 04/18/2017] trametinib tab 2 mg  2 mg Oral DAILY   ??? 0.9% sodium chloride infusion 250 mL  250 mL IntraVENous PRN    ??? [START ON 04/18/2017] levETIRAcetam (KEPPRA) 500 mg in 0.9% sodium chloride 100 mL IVPB  500 mg IntraVENous Q12H     Facility-Administered Medications Ordered in Other Encounters   Medication Dose Route Frequency   ??? pantoprazole (PROTONIX) 40 mg in sodium chloride 0.9% 10 mL injection  40 mg IntraVENous Q12H   ??? sodium bicarbonate (8.4%) 150 mEq in dextrose 5% 1,000 mL infusion   IntraVENous CONTINUOUS   ??? 0.9% sodium chloride infusion 250 mL  250 mL IntraVENous PRN       Allergies:  Allergies   Allergen Reactions   ??? Adhesive Tape-Silicones Rash       Review of Systems:  A comprehensive review of systems was negative except for that written in the History of Present Illness.    Objective:     Physical Exam:  Vitals:  There were no vitals taken for this visit.    General: No acute distress.  Skin:  Extremities and face reveal no rashes. No palmer erythema. No telangiectasias on the chest wall.  HEENT: Sclerae anicteric. No oral ulcers.  No abnormal pigmentation of the lips.  The neck is supple.  Cardiovascular: Regular rate and rhythm. No murmurs, gallops, or rubs.  Respiratory:  Comfortable breathing  With no accessory muscle use. Clear breath sounds with no wheezes, rales, or rhonchi.  GI:  Abdomen nondistended, soft, and nontender.  Normal active bowel sounds. No enlargement of the liver or spleen. No masses palpable.  Musculoskeletal:  No pitting edema of the lower legs.  Extremities have good range of motion.   Neurological:  Gross memory appears intact.  Patient is alert and oriented.  Psychiatric:  Mood appears appropriate with judgement intact.  Lymphatic:  No cervical or supraclavicular adenopathy.    Laboratory:    Recent Labs      04/17/17   1225   WBC  13.6*   RBC  5.03   HGB  14.6   HCT  47.1*   PLT  212      Recent Labs  04/17/17   1225   GLU  384*   NA  141   K  3.6   CL  99   CO2  21   BUN  20   CREA  1.51*   CA  9.1     Recent Labs      04/17/17   1625  04/17/17   1225   PTP   --   12.8    INR   --   1.0   APTT  26.1   --      Recent Labs      04/17/17   1225   SGOT  31   AP  90   ALB  2.9*   TP  6.7       Assessment:       A 61 y.o. 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      Plan:       If further hematochezia or if NG lavage reveals upper source will scope tonight  Otherwise will monitor  Continuing heparin gtt       Signed By: Emogene Morgan, MD     April 17, 2017

## 2017-04-17 NOTE — H&P (Addendum)
Hospitalist H&P Note     Admit Date:  04/17/2017 12:04 PM   Name:  Katelyn [name]   Age:  61 y.o.  DOB:  March 24, 1956   MRN:  [id number]   PCP:  Dianna Rossetti, MD  Treatment Team: Attending Provider: Elmer Ramp, MD    HPI:   Patient is a 61 y/o F with hx melanoma metastatic to liver, lung, and brain with brain edema and seizures, who presented to ER with convulsive activity.  Husband provides history as pt is very lethargic.  Apparently up until 11am this morning she was doing very well.  They went to breakfast and enjoyed their morning without any complaints.  She was getting out of the car around 11am and husband said she started having shaking of her left arm and leg with left sided weakness.  Husband said she has had 5 seizure-like episodes total at this point, and this episode strongly resembled her first episode.  She was brought to ER and continued to have recurrent shaking spells, some more generalized, and became progressively more lethargic.  Husband said she never lost consciousness.  Nursing witnessed one in the ICU but reports she remained aware during the episode.  Difficult to verify.  Petechiae noted on exam; husband says he first noticed those a week or 2 ago.     Cannot obtain history due to condition  Past Medical History:   Diagnosis Date   ??? Cancer ([place])     melanoma - back   ??? Hypercholesterolemia    ??? Hypertension     managed with meds   ??? Nausea & vomiting    ??? Osteopenia       Past Surgical History:   Procedure Laterality Date   ??? HX COLONOSCOPY     ??? HX GYN      c-section x 5   ??? HX ORTHOPAEDIC Left 04/30/2016    knee   ??? HX OTHER SURGICAL      melanoma removed from back      Allergies   Allergen Reactions   ??? Adhesive Tape-Silicones Rash      Social History   Substance Use Topics   ??? Smoking status: Never Smoker   ??? Smokeless tobacco: Never Used   ??? Alcohol use No      Family History   Problem Relation Age of Onset   ??? Hypertension Mother    ??? Elevated Lipids Mother     ??? Elevated Lipids Father    ??? Hypertension Father    ??? Arthritis-osteo Father      spine   ??? Heart Disease Father    ??? Breast Cancer Other    ??? Diabetes Sister       Immunization History   Administered Date(s) Administered   ??? TB Skin Test (PPD) Intradermal 10/14/2016, 11/19/2016     PTA Medications:  Prior to Admission Medications   Prescriptions Last Dose Informant Patient Reported? Taking?   LORazepam (ATIVAN) 1 mg tablet   No No   Sig: Take 1 Tab by mouth nightly as needed (insomnia). Max Daily Amount: 1 mg.   OTHER,NON-FORMULARY,   No No   Sig: Handicap Placard   acetaminophen (TYLENOL) 325 mg tablet   Yes No   Sig: Take 650 mg by mouth.   amLODIPine (NORVASC) 10 mg tablet   No No   Sig: Take 1 Tab by mouth nightly.   cetirizine (ZYRTEC) 10 mg tablet   Yes No  Sig: Take  by mouth.   dabrafenib (TAFINLAR) 75 mg cap   No No   Sig: Take 2 Caps by mouth two (2) times a day.   dexamethasone (DECADRON) 4 mg tablet   Yes No   Sig: Take 4 mg by mouth four (4) times daily.   fluticasone (FLONASE) 50 mcg/actuation nasal spray   Yes No   Sig: 2 Sprays by Both Nostrils route nightly.   ibuprofen (MOTRIN) 200 mg tablet   Yes No   Sig: Take 200 mg by mouth every six (6) hours as needed for Pain.   levETIRAcetam (KEPPRA) 500 mg tablet   No No   Sig: Take 1 Tab by mouth two (2) times a day.   losartan (COZAAR) 100 mg tablet   Yes No   Sig: Take 100 mg by mouth.   simvastatin (ZOCOR) 20 mg tablet   No No   Sig: Take 1 Tab by mouth nightly.   spironolactone (ALDACTONE) 25 mg tablet   No No   Sig: Take 1 Tab by mouth daily.   traMADol (ULTRAM) 50 mg tablet   No No   Sig: Take 1 Tab by mouth every six (6) hours as needed for Pain. Max Daily Amount: 200 mg. Indications: Pain   trametinib (MEKINIST) 2 mg tab   No No   Sig: Take 1 Tab by mouth daily.      Facility-Administered Medications: None       Objective:     Patient Vitals for the past 24 hrs:   Temp Pulse Resp BP SpO2   04/17/17 1630 - 96 (!) 36 - 92 %    04/17/17 1545 - (!) 108 (!) 32 125/75 93 %   04/17/17 1536 - (!) 150 (!) 31 (!) 167/112 93 %   04/17/17 1515 - (!) 152 (!) 32 (!) 175/118 94 %   04/17/17 1500 - (!) 129 29 (!) 135/110 94 %   04/17/17 1439 - - - - 94 %   04/17/17 1436 97.2 ??F (36.2 ??C) (!) 126 29 120/76 94 %   04/17/17 1333 - (!) 117 24 - 98 %   04/17/17 1332 - (!) 118 24 98/70 98 %   04/17/17 1327 - (!) 130 (!) 35 - -   04/17/17 1326 - (!) 126 (!) 46 119/85 95 %   04/17/17 1319 97.3 ??F (36.3 ??C) - - - -   04/17/17 1244 - (!) 151 (!) 33 - 96 %   04/17/17 1243 - (!) 152 (!) 96 194/82 96 %   04/17/17 1234 97.4 ??F (36.3 ??C) (!) 128 - 108/83 -     Oxygen Therapy  O2 Sat (%): 92 % (04/17/17 1630)  Pulse via Oximetry: 104 beats per minute (04/17/17 1630)  O2 Device: Hi flow nasal cannula (04/17/17 1439)  O2 Flow Rate (L/min): 9 l/min (04/17/17 1439)  FIO2 (%): 100 % (04/17/17 1439)  No intake or output data in the 24 hours ending 04/17/17 1655    Physical Exam:  General:    Well nourished.  Lethargic, barely arousable.  Oriented to self only.    Eyes:   Normal sclera.  Extraocular movements intact.  PERRL  ENT:  Normocephalic, atraumatic.  Moist mucous membranes  CV:   Tachy, reg.  No m/r/g.   Lungs:  CTAB anteriorly.  No wheezing, rhonchi, or rales.  Abdomen: Soft, nontender, nondistended.   Extremities: Warm and dry.  No cyanosis or edema.  Neurologic: CN II-XII grossly intact.  Sensation intact.  Skin:     Scattered nonblanching petechiae on arms and legs.   no jaundice.  Normal coloration  Psych:  Normal mood and affect.    I reviewed the labs, imaging, EKGs, telemetry, and other studies done this admission.  Data Review:   Recent Results (from the past 24 hour(s))   GLUCOSE, POC    Collection Time: 04/17/17 12:24 PM   Result Value Ref Range    Glucose (POC) 336 (H) 65 - 322 mg/dL   METABOLIC PANEL, COMPREHENSIVE    Collection Time: 04/17/17 12:25 PM   Result Value Ref Range    Sodium 141 136 - 145 mmol/L    Potassium 3.6 3.5 - 5.1 mmol/L     Chloride 99 98 - 107 mmol/L    CO2 21 21 - 32 mmol/L    Anion gap 21 (H) 7 - 16 mmol/L    Glucose 384 (H) 65 - 100 mg/dL    BUN 20 8 - 23 MG/DL    Creatinine 1.51 (H) 0.6 - 1.0 MG/DL    GFR est AA 45 (L) >60 ml/min/1.82m    GFR est non-AA 37 (L) >60 ml/min/1.733m   Calcium 9.1 8.3 - 10.4 MG/DL    Bilirubin, total 0.4 0.2 - 1.1 MG/DL    ALT (SGPT) 42 12 - 65 U/L    AST (SGOT) 31 15 - 37 U/L    Alk. phosphatase 90 50 - 136 U/L    Protein, total 6.7 6.3 - 8.2 g/dL    Albumin 2.9 (L) 3.2 - 4.6 g/dL    Globulin 3.8 (H) 2.3 - 3.5 g/dL    A-G Ratio 0.8 (L) 1.2 - 3.5     CBC WITH AUTOMATED DIFF    Collection Time: 04/17/17 12:25 PM   Result Value Ref Range    WBC 13.6 (H) 4.3 - 11.1 K/uL    RBC 5.03 4.05 - 5.25 M/uL    HGB 14.6 11.7 - 15.4 g/dL    HCT 47.1 (H) 35.8 - 46.3 %    MCV 93.6 79.6 - 97.8 FL    MCH 29.0 26.1 - 32.9 PG    MCHC 31.0 (L) 31.4 - 35.0 g/dL    RDW 17.2 (H) 11.9 - 14.6 %    PLATELET 212 150 - 450 K/uL    MPV 9.4 (L) 10.8 - 14.1 FL    NEUTROPHILS 57 47 - 75 %    LYMPHOCYTES 40 16 - 44 %    MONOCYTES 3 3 - 9 %    NRBC 2.0 PER 100 WBC    ABS. NEUTROPHILS 7.7 1.7 - 8.2 K/UL    ABS. LYMPHOCYTES 5.5 (H) 0.5 - 4.6 K/UL    ABS. MONOCYTES 0.4 0.1 - 1.3 K/UL    RBC COMMENTS SLIGHT  ANISOCYTOSIS + POIKILOCYTOSIS        RBC COMMENTS OCCASIONAL  POLYCHROMASIA        WBC COMMENTS OCCASIONAL      PLATELET COMMENTS ADEQUATE      DF AUTOMATED     PROTHROMBIN TIME + INR    Collection Time: 04/17/17 12:25 PM   Result Value Ref Range    Prothrombin time 12.8 11.5 - 14.5 sec    INR 1.0     MAGNESIUM    Collection Time: 04/17/17 12:25 PM   Result Value Ref Range    Magnesium 2.5 (H) 1.8 - 2.4 mg/dL   TROPONIN I    Collection Time: 04/17/17 12:25 PM  Result Value Ref Range    Troponin-I, Qt. 1.26 (HH) 0.02 - 0.05 NG/ML   EKG, 12 LEAD, INITIAL    Collection Time: 04/17/17 12:27 PM   Result Value Ref Range    Ventricular Rate 165 BPM    Atrial Rate 174 BPM    QRS Duration 98 ms    Q-T Interval 302 ms     QTC Calculation (Bezet) 500 ms    Calculated R Axis 90 degrees    Calculated T Axis 43 degrees    Diagnosis       Undetermined rhythm  Rightward axis  Incomplete right bundle branch block  Marked ST abnormality, possible inferior subendocardial injury  Abnormal ECG  When compared with ECG of 28-Feb-2017 10:21,  Significant changes have occurred     BLOOD GAS, ARTERIAL    Collection Time: 04/17/17 12:30 PM   Result Value Ref Range    pH 7.15 (L) 7.35 - 7.45      PCO2 22 (L) 35 - 45 mmHg    PO2 147 (H) 80 - 105 mmHg    BICARBONATE 7 (L) 22 - 26 mmol/L    BASE DEFICIT 19.6 (H) 0 - 2 mmol/L    TOTAL HEMOGLOBIN 14.8 11.7 - 15.0 GM/DL    O2 SAT 98 92 - 98.5 %    Arterial O2 Hgb 97.5 (H) 94 - 97 %    CARBOXYHEMOGLOBIN 0.0 (L) 0.5 - 1.5 %    METHEMOGLOBIN 0.3 0.0 - 1.5 %    DEOXYHEMOGLOBIN 2 0.0 - 5.0 %    SITE LB     ALLENS TEST NA     MODE NRB     O2 FLOW 15.00 L/min    Respiratory comment: Dr. Casimer Leek at 4 28 2018 12 35 53 PM. Read back.    GLUCOSE, POC    Collection Time: 04/17/17  4:40 PM   Result Value Ref Range    Glucose (POC) 392 (H) 65 - 100 mg/dL       All Micro Results     Procedure Component Value Units Date/Time    CULTURE, BLOOD [[id number]] Collected:  04/17/17 1625    Order Status:  Completed Specimen:  Blood from Blood Updated:  04/17/17 1652    CULTURE, BLOOD [[id number]] Collected:  04/17/17 1625    Order Status:  Completed Specimen:  Blood from Blood Updated:  04/17/17 1651          Other Studies:  Xr Chest Sngl V    Result Date: 04/17/2017  PORTABLE CHEST, April 17, 2017 at 1246 hours CLINICAL HISTORY:  Shortness of breath and seizure-like activity today with metastatic melanoma. COMPARISON:  March 12, 2017. FINDINGS:  AP erect image demonstrates no confluent infiltrate or significant pleural fluid.  There is mild left basilar and medial right upper lobe atelectasis/infiltrate.  The heart size is within normal limits without evidence of congestive heart failure  or pneumothorax.  The bony thorax appears intact on this view.  There are overlying radiopaque support devices.     IMPRESSION:  MILD RIGHT UPPER LOBE AND LEFT BASILAR ATELECTASIS/INFILTRATE.    Ct Head Wo Cont    Result Date: 04/17/2017  NONCONTRAST HEAD CT CLINICAL HISTORY:  Seizure-like activity with no new intracranial metastases from malignant melanoma. COMPARISON:  February 28, 2017. REPORT:   Multiple bilateral parenchymal metastases are again noted.  There is less edema adjacent to the dominant left parietal metastasis.  No new or enlarging masses evident.  There is no significant  midline shift.  The ventricles are normal in size and configuration, accounting for the patient's age.  Orbits and  paranasal sinuses are clear where imaged. Bone windows demonstrate no definite fracture or destruction.     IMPRESSION:     MULTIFOCAL METASTASES WITH LESS MASS EFFECT THAN ON MARCH 11. NO NEW INTRACRANIAL ABNORMALITY IS IDENTIFIED.     Ct Chest W Cont    Addendum Date: 04/17/2017    Addendum: Belenda Cruise at 1554 hours: The initial report was inadvertently signed prematurely.  The findings and impression should read instead: "FINDINGS:  There is extensive pulmonary embolism bilaterally, most marked in the right lower lobe.  There is no definite pneumothorax.  No pathologically enlarged lymph nodes or abnormal fluid collection is seen.  There has been interval improvement in bilateral pulmonary parenchymal nodules since October 2017 with a residual 9 mm nodule medially at the right base.  Visualized hepatic metastatic disease has also improved. IMPRESSION:  1.  EXTENSIVE BILATERAL PULMONARY EMBOLI. 2.  INTERVAL IMPROVEMENT IN PULMONARY PARENCHYMAL AND HEPATIC METASTATIC DISEASE SINCE OCTOBER 2017." Verification of these critical results and issuance of this addendum was discussed by telephone with the patient's ICU nurse, [name], at 1550 hours.     Result Date: 04/17/2017   CHEST CT ANGIOGRAPHY WITH ADDITIONAL REFORMATS:  CLINICAL HISTORY:  Shortness of breath. TECHNIQUE:  During bolus injection of nonionic intravenous contrast, the chest was scanned with spiral technique, and coronal reformats were produced. COMPARISON:  Portable chest today enhanced CT torso of October 15, 2016. FINDINGS:  There is expected opacification of the pulmonary arterial tree with no intraluminal soft tissue density to suggest acute pulmonary embolism.  There is no definite pneumothorax.  No pathologically enlarged lymph nodes or abnormal fluid collection is seen.  The epigastrium appears unremarkable as imaged.     IMPRESSION:  NO DEFINITE PULMONARY EMBOLISM OR OTHER ACUTE THORACIC ABNORMALITY IDENTIFIED.  The findings were called to the patient's ICU nurse, Joellen Jersey, on April 17, 2017 at 1544 hours by myself.  789 Critical results were communicated as outlined in Section II.C.2.a.i of the ACR Practice Guideline for Communication of Diagnostic Imaging Findings.       Assessment and Plan:     Hospital Problems as of 04/17/2017  Date Reviewed: 05-13-2017          Codes Class Noted - Resolved POA    Seizure ([place]) ICD-10-CM: R56.9  ICD-9-CM: 780.39  04/17/2017 - Present Yes        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 ([place]) (Chronic) ICD-10-CM: C78.7  ICD-9-CM: 197.7, 172.9  04/17/2017 - Present Yes        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 [facility]) (Chronic) ICD-10-CM: C78.00  ICD-9-CM: 197.0, 172.9  04/17/2017 - Present Yes        AKI (acute kidney injury) ([place]) ICD-10-CM: N17.9  ICD-9-CM: 584.9  04/17/2017 - Present Yes        Demand ischemia ([place]) ICD-10-CM: I24.8  ICD-9-CM: 411.89  04/17/2017 - Present Yes        Brain edema ([facility]) (Chronic) ICD-10-CM: G93.6  ICD-9-CM: 348.5  04/17/2017 - Present Yes        Acute respiratory failure ([place]) ICD-10-CM: J96.00  ICD-9-CM: 518.81  04/17/2017 - Present Yes        SIRS (systemic inflammatory response syndrome) ([place]) ICD-10-CM: R65.10  ICD-9-CM: 995.90  04/17/2017 - Present Yes         * ([place])Acute pulmonary embolism ([place]) ICD-10-CM: I26.99  ICD-9-CM: 415.19  04/17/2017 - Present Yes  Aphasia (Chronic) ICD-10-CM: R47.01  ICD-9-CM: 784.3  02/28/2017 - Present Yes        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 Admit to inpatient  ?? Seizure - cont keppra, IV as unable to take PO.  Monitor.   ?? PE vs PNA - check chest CTA chest stat; further adjustments to plan of care will be made when we have results.   ?? SIRS - likely from above  ?? Petechiae - husband reports have been present appx 1 week.  Unclear etiology.  caogs and platelets ok.  go ahead and draw blood cultures and start IV rocephin/azithro.  monitor  ?? AKI - give IVF.  Check CK  ?? Elevated trop - possibly demand ischemia.  trend.  Cardiac monitor.  Keep HR controlled with PRN metoprolol  ?? Metastatic Melanoma of brain - cont home decadron, convert to IV while postictal.  Cont home antineoplastics when taking PO    ADDENDUM:  Acute bilateral PEs confirmed on CTA chest; report was called to nursing.  Not a saddle PE per verbal report.  Start heparin gtt now.     Discharge planning:  PPD/PT/OT  DVT ppx: lovenox  Code status:  Full  Estimated LOS:  Greater than 2 midnights  Critical care time spent 45 minutes    Signed:  Elmer Ramp, MD

## 2017-04-17 NOTE — ED Notes (Signed)
TRANSFER - OUT REPORT:    Verbal report given to [name] on Katelyn [name]  being transferred to ICU 373 for routine progression of care       Report consisted of patient???s Situation, Background, Assessment and   Recommendations(SBAR).     Information from the following report(s) SBAR, ED Summary, Intake/Output, MAR and Cardiac Rhythm Sinus Tachycardia was reviewed with the receiving nurse.    Lines:   Peripheral IV 04/17/17 Right Antecubital (Active)   Site Assessment Clean, dry, & intact 04/17/2017 12:29 PM   Phlebitis Assessment 0 04/17/2017 12:29 PM   Infiltration Assessment 0 04/17/2017 12:29 PM   Dressing Status Clean, dry, & intact 04/17/2017 12:29 PM        Opportunity for questions and clarification was provided.      Patient transported with:   O2 @ 6 liters

## 2017-04-17 NOTE — ED Provider Notes (Signed)
HPI Comments: 61 year old female with history of metastatic melanoma to the brain who presents with seizure-like activity that began just prior to arrival.  Husband states she is compliant with Keppra at home. States she last had seizure-like activity around one month ago.  States patient status post [facility] by [facility] (Dr. Joneen Caraway) in March.  States she is currently on 2 oral chemotherapy agents at this time.  States that she was acting at her baseline mental status earlier today and that they got breakfast together.  States she began to have tonic-clonic jerking of left upper and lower extremities with groaning.  Denies tongue biting or bowel or bladder incontinence.  Denies any recent fever.     Patient is a 61 y.o. female presenting with seizures. The history is provided by the patient. No language interpreter was used.   Seizure    This is a recurrent problem. The current episode started less than 1 hour ago. The problem has not changed since onset.There was 1 seizure. The most recent episode lasted more than 5 minutes. Associated symptoms include confusion. Pertinent negatives include no headaches, no speech difficulty, no visual disturbance, no neck stiffness, no sore throat, no chest pain, no cough, no vomiting, no diarrhea and no muscle weakness. Characteristics include eye blinking, eye deviation, rhythmic jerking and loss of consciousness. Characteristics do not include bowel incontinence, bladder incontinence, bit tongue, apnea or cyanosis. The episode was witnessed.  The seizures continued in the ED. Possible causes do not include medication or dosage change or sleep deprivation. There has been no fever.  She reports confusion.  She reports no chest pain, no visual disturbance, no diarrhea, no vomiting, no headaches, no sore throat, no muscle weakness, no stiff neck, no speech difficulty, and no cough. There were no medications administered prior to arrival. Home seizure medications include: Keppra.        Past Medical History:   Diagnosis Date   ??? Cancer ([place])     melanoma - back   ??? Hypercholesterolemia    ??? Hypertension     managed with meds   ??? Nausea & vomiting    ??? Osteopenia        Past Surgical History:   Procedure Laterality Date   ??? HX COLONOSCOPY     ??? HX GYN      c-section x 5   ??? HX ORTHOPAEDIC Left 04/30/2016    knee   ??? HX OTHER SURGICAL      melanoma removed from back         Family History:   Problem Relation Age of Onset   ??? Hypertension Mother    ??? Elevated Lipids Mother    ??? Elevated Lipids Father    ??? Hypertension Father    ??? Arthritis-osteo Father      spine   ??? Heart Disease Father    ??? Breast Cancer Other    ??? Diabetes Sister        Social History     Social History   ??? Marital status: MARRIED     Spouse name: N/A   ??? Number of children: N/A   ??? Years of education: N/A     Occupational History   ??? Not on file.     Social History Main Topics   ??? Smoking status: Never Smoker   ??? Smokeless tobacco: Never Used   ??? Alcohol use No   ??? Drug use: Not on file   ??? Sexual activity: Not on  file     Other Topics Concern   ??? Not on file     Social History Narrative         ALLERGIES: Adhesive tape-silicones    Review of Systems   Constitutional: Negative for chills and fever.   HENT: Negative for sore throat.    Eyes: Negative for visual disturbance.   Respiratory: Positive for shortness of breath. Negative for apnea and cough.    Cardiovascular: Negative for chest pain and cyanosis.   Gastrointestinal: Negative for abdominal pain, bowel incontinence, diarrhea and vomiting.   Genitourinary: Negative for bladder incontinence.   Musculoskeletal: Negative for neck pain and neck stiffness.   Skin: Negative for pallor and rash.   Neurological: Positive for seizures and loss of consciousness. Negative for speech difficulty and headaches.   Psychiatric/Behavioral: Positive for confusion.       Vitals:    04/17/17 1545 04/17/17 1630 04/17/17 1657 04/17/17 1715   BP: 125/75  94/73 (!) 78/49    Pulse: (!) 108 96 (!) 104 99   Resp: (!) 32 (!) 36 (!) 38 (!) 41   Temp:       SpO2: 93% 92% 96% 96%   Weight:       Height:                Physical Exam   Constitutional: She is oriented to person, place, and time. She appears well-developed and well-nourished.   Patient actively seizing with left upper and lower extremity tonic clonic jerking.   HENT:   Head: Normocephalic and atraumatic.   Mouth/Throat: Oropharynx is clear and moist. No oropharyngeal exudate.   Eyes: Conjunctivae and EOM are normal. Pupils are equal, round, and reactive to light.   Neck: Normal range of motion. No JVD present. No tracheal deviation present.   Cardiovascular: Normal rate, regular rhythm, normal heart sounds and intact distal pulses.    Radial pulse 2+ and equal bilaterally.   Pulmonary/Chest: Effort normal and breath sounds normal.   CTAB.    Abdominal: Soft. There is no tenderness. There is no rebound.   Musculoskeletal: Normal range of motion. She exhibits no edema.   No significant calf TTP.   Neurological: She is alert and oriented to person, place, and time. No cranial nerve deficit. Coordination normal.   Patient with focal tonic-clonic jerking of the left upper and lower extremities. Leftward eye deviation. Pt intermittently able to carry out conversations during seizure episodes and remains AA&O x3.   Skin: No rash noted. No erythema.   Nursing note and vitals reviewed.       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  Seizure-like activity improved after ativan + keppra. CT head w/ stable findings.  Pt with increased SOB; placed on non-rebreather; ABG as noted below.   Pt with hx of lung mets; concern for possible PE.  Hospitalist consulted in regards for admission. CT chest pending at this time.  Patient taken from CT directly upstairs and not brought back to ED. Patient anticoagulated upon result of bilateral PEs.  Later informed of bilateral PEs from admitting doctor.         Amount and/or Complexity of Data Reviewed  Clinical lab tests:  ordered and reviewed  Tests in the radiology section of CPT??: ordered and reviewed  Tests in the medicine section of CPT??: ordered and reviewed  Review and summarize past medical records: yes  Independent visualization of images, tracings, or specimens: yes    Risk of Complications, Morbidity, and/or Mortality  Presenting problems: moderate  Diagnostic procedures: moderate  Management options: moderate    Patient Progress  Patient progress: stable        ED Course   Comment By Time   CT head IMPRESSION: ?? ?? MULTIFOCAL METASTASES WITH LESS MASS EFFECT THAN ON MARCH 11. NO NEW INTRACRANIAL ABNORMALITY IS IDENTIFIED. Tymara Saur Steger Shirlean Schlein., MD 04/28 1353   CXR IMPRESSION:  MILD RIGHT UPPER LOBE AND LEFT BASILAR ATELECTASIS/INFILTRATE. Zadiel Leyh Steger Shirlean Schlein., MD 04/28 1354       Procedures    Results Include:    Recent Results (from the past 24 hour(s))   GLUCOSE, POC    Collection Time: 04/17/17 12:24 PM   Result Value Ref Range    Glucose (POC) 336 (H) 65 - 045 mg/dL   METABOLIC PANEL, COMPREHENSIVE    Collection Time: 04/17/17 12:25 PM   Result Value Ref Range    Sodium 141 136 - 145 mmol/L    Potassium 3.6 3.5 - 5.1 mmol/L    Chloride 99 98 - 107 mmol/L    CO2 21 21 - 32 mmol/L    Anion gap 21 (H) 7 - 16 mmol/L    Glucose 384 (H) 65 - 100 mg/dL    BUN 20 8 - 23 MG/DL    Creatinine 1.51 (H) 0.6 - 1.0 MG/DL    GFR est AA 45 (L) >60 ml/min/1.66m    GFR est non-AA 37 (L) >60 ml/min/1.740m   Calcium 9.1 8.3 - 10.4 MG/DL    Bilirubin, total 0.4 0.2 - 1.1 MG/DL    ALT (SGPT) 42 12 - 65 U/L    AST (SGOT) 31 15 - 37 U/L     Alk. phosphatase 90 50 - 136 U/L    Protein, total 6.7 6.3 - 8.2 g/dL    Albumin 2.9 (L) 3.2 - 4.6 g/dL    Globulin 3.8 (H) 2.3 - 3.5 g/dL    A-G Ratio 0.8 (L) 1.2 - 3.5     CBC WITH AUTOMATED DIFF    Collection Time: 04/17/17 12:25 PM   Result Value Ref Range    WBC 13.6 (H) 4.3 - 11.1 K/uL    RBC 5.03 4.05 - 5.25 M/uL    HGB 14.6 11.7 - 15.4 g/dL    HCT 47.1 (H) 35.8 - 46.3 %    MCV 93.6 79.6 - 97.8 FL    MCH 29.0 26.1 - 32.9 PG    MCHC 31.0 (L) 31.4 - 35.0 g/dL    RDW 17.2 (H) 11.9 - 14.6 %    PLATELET 212 150 - 450 K/uL    MPV 9.4 (L) 10.8 - 14.1 FL    NEUTROPHILS 57 47 - 75 %    LYMPHOCYTES 40 16 - 44 %    MONOCYTES 3 3 - 9 %    NRBC 2.0 PER 100 WBC    ABS. NEUTROPHILS 7.7 1.7 - 8.2 K/UL    ABS. LYMPHOCYTES 5.5 (H) 0.5 - 4.6 K/UL    ABS. MONOCYTES 0.4 0.1 - 1.3 K/UL    RBC COMMENTS SLIGHT  ANISOCYTOSIS + POIKILOCYTOSIS        RBC COMMENTS OCCASIONAL  POLYCHROMASIA  WBC COMMENTS OCCASIONAL      PLATELET COMMENTS ADEQUATE      DF AUTOMATED     PROTHROMBIN TIME + INR    Collection Time: 04/17/17 12:25 PM   Result Value Ref Range    Prothrombin time 12.8 11.5 - 14.5 sec    INR 1.0     MAGNESIUM    Collection Time: 04/17/17 12:25 PM   Result Value Ref Range    Magnesium 2.5 (H) 1.8 - 2.4 mg/dL   TROPONIN I    Collection Time: 04/17/17 12:25 PM   Result Value Ref Range    Troponin-I, Qt. 1.26 (HH) 0.02 - 0.05 NG/ML   EKG, 12 LEAD, INITIAL    Collection Time: 04/17/17 12:27 PM   Result Value Ref Range    Ventricular Rate 165 BPM    Atrial Rate 174 BPM    QRS Duration 98 ms    Q-T Interval 302 ms    QTC Calculation (Bezet) 500 ms    Calculated R Axis 90 degrees    Calculated T Axis 43 degrees    Diagnosis       Undetermined rhythm  Rightward axis  Incomplete right bundle branch block  Marked ST abnormality, possible inferior subendocardial injury  Abnormal ECG  When compared with ECG of 28-Feb-2017 10:21,  Significant changes have occurred     BLOOD GAS, ARTERIAL    Collection Time: 04/17/17 12:30 PM    Result Value Ref Range    pH 7.15 (L) 7.35 - 7.45      PCO2 22 (L) 35 - 45 mmHg    PO2 147 (H) 80 - 105 mmHg    BICARBONATE 7 (L) 22 - 26 mmol/L    BASE DEFICIT 19.6 (H) 0 - 2 mmol/L    TOTAL HEMOGLOBIN 14.8 11.7 - 15.0 GM/DL    O2 SAT 98 92 - 98.5 %    Arterial O2 Hgb 97.5 (H) 94 - 97 %    CARBOXYHEMOGLOBIN 0.0 (L) 0.5 - 1.5 %    METHEMOGLOBIN 0.3 0.0 - 1.5 %    DEOXYHEMOGLOBIN 2 0.0 - 5.0 %    SITE LB     ALLENS TEST NA     MODE NRB     O2 FLOW 15.00 L/min    Respiratory comment: Dr. Casimer Leek at 4 28 2018 12 35 53 PM. Read back.    TROPONIN I    Collection Time: 04/17/17  4:25 PM   Result Value Ref Range    Troponin-I, Qt. 1.74 (HH) 0.02 - 0.05 NG/ML   CK    Collection Time: 04/17/17  4:25 PM   Result Value Ref Range    CK 83 21 - 215 U/L   LACTIC ACID    Collection Time: 04/17/17  4:25 PM   Result Value Ref Range    Lactic acid 9.8 (HH) 0.4 - 2.0 MMOL/L   PTT    Collection Time: 04/17/17  4:25 PM   Result Value Ref Range    aPTT 26.1 23.2 - 35.3 SEC   GLUCOSE, POC    Collection Time: 04/17/17  4:40 PM   Result Value Ref Range    Glucose (POC) 392 (H) 65 - 100 mg/dL   BLOOD GAS, ARTERIAL    Collection Time: 04/17/17  5:48 PM   Result Value Ref Range    pH 7.33 (L) 7.35 - 7.45      PCO2 26 (L) 35 - 45 mmHg    PO2 104 80 -  105 mmHg    BICARBONATE 13 (L) 22 - 26 mmol/L    BASE DEFICIT 10.9 (H) 0 - 2 mmol/L    SITE LR     ALLENS TEST POSITIVE      MODE HFNC     O2 FLOW 9.00 L/min    Respiratory comment: Dr. Quillian Quince at 4 28 2018 5 54 36 PM. Read back.

## 2017-04-17 NOTE — Consults (Signed)
CONSULT NOTE    Katelyn [name]    04/17/2017    Date of Admission:  04/17/2017    The patient's chart is reviewed and the patient is discussed with the staff.    Subjective:     The patient is a 61 y.o. Caucasian female seen and evaluated at the request of Dr. Quillian Quince and per the ICU/CCU [place] Protocol.    She has a history of Stage 4 Malignant melanoma with brain, liver, and lung involvement. She was doing reasonably well until this AM when she suddenly developed seizure activity with weakness. She had multiple recurrences. She presented to [facility] and was initially managed in the ICU. A CT of the chest was done in the course of her evaluation revealing extensive pulmonary emboli for which she is now on a heparin drip. IR was consulted and felt she was not a candidate for EKOS or any procedure at present. She is currently off levophed.. 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    On arrival the patient has been noted to have grossly bloody stool.     He husband reports that prior to this AM she had no issue with cough, chest pain, dyspnea, fever, or dysuria.       Review of Systems    Denies: fevers, chills, sweats,  anorexia, weight loss   Denies: blurry vision, loss of vision, eye pain, photophobia  Denies: hearing loss, ringing in the ears, earache, epistaxis  Denies: chest pain, palpitations, syncope, orthopnea, paroxysmal nocturnal dyspnea, claudication  Denies: dysphagia, odynophagia, nausea, vomiting, diarrhea, constipation, abdominal pain, jaundice,   Denies: frequency, dysuria, nocturia, urinary incontinence, stones, hematuria  Denies: polydipsia/polyuria, skin changes, temperature intolerance, unexpected weight gain  Denies: back pain, joint pain, joint swelling, muscle pain, muscle weakness  Denies: bleeding problems, blood transfusions, bruising, pallor, swollen lymph nodes  Denies: headache, dysarthria, blurred vision, diplopia, focal deficits.     Admits to: fatigue, malaise, seizure, melena now noted.              Patient Active Problem List   Diagnosis Code   ??? Osteopenia M85.80   ??? HTN (hypertension) I10   ??? Hyperlipidemia E78.5   ??? Liver lesion K76.9   ??? Secondary malignant melanoma of brain ([place]) C79.31   ??? 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[place]) I61.9   ??? 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 Memory difficulties R41.3   ??? Aphasia R47.01   ??? Seizure ([place]) R56.9   ??? 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 AKI (acute kidney injury) ([place]) N17.9   ??? Demand ischemia (HCC) I24.8   ??? Brain edema (HCC) G93.6   ??? Acute respiratory failure (HCC) J96.00   ??? SIRS (systemic inflammatory response syndrome) (HCC) R65.10   ??? Acute pulmonary embolism (HCC) I26.99   ??? Shock (HCC) R57.9   ??? Lactic acidosis E87.2           Prior to Admission Medications   Prescriptions Last Dose Informant Patient Reported? Taking?   LORazepam (ATIVAN) 1 mg tablet   No No   Sig: Take 1 Tab by mouth nightly as needed (insomnia). Max Daily Amount: 1 mg.   OTHER,NON-FORMULARY,   No No   Sig: Handicap Placard   acetaminophen (TYLENOL) 325 mg tablet   Yes No   Sig: Take 650 mg by mouth.   amLODIPine (NORVASC) 10 mg tablet  No No   Sig: Take 1 Tab by mouth nightly.   cetirizine (ZYRTEC) 10 mg tablet   Yes No   Sig: Take  by mouth.   dabrafenib (TAFINLAR) 75 mg cap   No No   Sig: Take 2 Caps by mouth two (2) times a day.   dexamethasone (DECADRON) 4 mg tablet   Yes No   Sig: Take 4 mg by mouth four (4) times daily.   fluticasone (FLONASE) 50 mcg/actuation nasal spray   Yes No   Sig: 2 Sprays by Both Nostrils route nightly.   ibuprofen (MOTRIN) 200 mg tablet   Yes No   Sig: Take 200 mg by mouth every six (6) hours as needed for Pain.   levETIRAcetam (KEPPRA) 500 mg tablet   No No   Sig: Take 1 Tab by mouth two (2) times a day.   losartan (COZAAR) 100 mg tablet   Yes No   Sig: Take 100 mg by mouth.    simvastatin (ZOCOR) 20 mg tablet   No No   Sig: Take 1 Tab by mouth nightly.   spironolactone (ALDACTONE) 25 mg tablet   No No   Sig: Take 1 Tab by mouth daily.   traMADol (ULTRAM) 50 mg tablet   No No   Sig: Take 1 Tab by mouth every six (6) hours as needed for Pain. Max Daily Amount: 200 mg. Indications: Pain   trametinib (MEKINIST) 2 mg tab   No No   Sig: Take 1 Tab by mouth daily.      Facility-Administered Medications: None       Past Medical History:   Diagnosis Date   ??? Cancer ([place])     melanoma - back   ??? Hypercholesterolemia    ??? Hypertension     managed with meds   ??? Nausea & vomiting    ??? Osteopenia      Past Surgical History:   Procedure Laterality Date   ??? HX COLONOSCOPY     ??? HX GYN      c-section x 5   ??? HX ORTHOPAEDIC Left 04/30/2016    knee   ??? HX OTHER SURGICAL      melanoma removed from back     Social History     Social History   ??? Marital status: MARRIED     Spouse name: N/A   ??? Number of children: N/A   ??? Years of education: N/A     Occupational History   ??? Not on file.     Social History Main Topics   ??? Smoking status: Never Smoker   ??? Smokeless tobacco: Never Used   ??? Alcohol use No   ??? Drug use: Not on file   ??? Sexual activity: Not on file     Other Topics Concern   ??? Not on file     Social History Narrative     Family History   Problem Relation Age of Onset   ??? Hypertension Mother    ??? Elevated Lipids Mother    ??? Elevated Lipids Father    ??? Hypertension Father    ??? Arthritis-osteo Father      spine   ??? Heart Disease Father    ??? Breast Cancer Other    ??? Diabetes Sister      Allergies   Allergen Reactions   ??? Adhesive Tape-Silicones Rash       Current Facility-Administered Medications   Medication Dose Route Frequency   ???  ibuprofen (MOTRIN) tablet 800 mg  800 mg Oral Q6H PRN   ??? simvastatin (ZOCOR) tablet 20 mg  20 mg Oral QHS   ??? bisacodyl (DULCOLAX) tablet 5 mg  5 mg Oral DAILY PRN   ??? guaiFENesin-dextromethorphan (ROBITUSSIN DM) 100-10 mg/5 mL syrup 10 mL  10 mL Oral Q4H PRN    ??? hydrALAZINE (APRESOLINE) 20 mg/mL injection 20 mg  20 mg IntraVENous Q6H PRN   ??? HYDROcodone-acetaminophen (NORCO) 5-325 mg per tablet 1 Tab  1 Tab Oral Q4H PRN   ??? HYDROcodone-acetaminophen (NORCO) 10-325 mg tablet 1 Tab  1 Tab Oral Q4H PRN   ??? polyethylene glycol (MIRALAX) packet 17 g  17 g Oral DAILY PRN   ??? 0.9% sodium chloride infusion  125 mL/hr IntraVENous CONTINUOUS   ??? sodium chloride (NS) flush 5-10 mL  5-10 mL IntraVENous Q8H   ??? sodium chloride (NS) flush 5-10 mL  5-10 mL IntraVENous PRN   ??? acetaminophen (TYLENOL) tablet 650 mg  650 mg Oral Q4H PRN   ??? naloxone (NARCAN) injection 0.4 mg  0.4 mg IntraVENous PRN   ??? diphenhydrAMINE (BENADRYL) injection 25 mg  25 mg IntraVENous Q4H PRN   ??? ondansetron (ZOFRAN) injection 4 mg  4 mg IntraVENous Q4H PRN   ??? senna-docusate (PERICOLACE) 8.6-50 mg per tablet 2 Tab  2 Tab Oral DAILY PRN   ??? LORazepam (ATIVAN) tablet 1 mg  1 mg Oral Q4H PRN   ??? zolpidem (AMBIEN) tablet 5 mg  5 mg Oral QHS PRN   ??? dexamethasone (DECADRON) 4 mg/mL injection 4 mg  4 mg IntraVENous Q6H   ??? metoprolol (LOPRESSOR) injection 5 mg  5 mg IntraVENous Q4H PRN   ??? tuberculin injection 5 Units  5 Units IntraDERMal ONCE   ??? dabrafenib cap 150 mg - patient supplied   150 mg Oral BID   ??? [START ON 04/18/2017] trametinib tab 2 mg - patient supplied  (Patient Supplied)  2 mg Oral DAILY   ??? heparin 25,000 units in dextrose 500 mL infusion  18-36 Units/kg/hr IntraVENous TITRATE   ??? insulin lispro (HUMALOG) injection   SubCUTAneous AC&HS   ??? dextrose 40% (GLUTOSE) oral gel 1 Tube  15 g Oral PRN   ??? glucagon (GLUCAGEN) injection 1 mg  1 mg IntraMUSCular PRN   ??? dextrose (D50W) injection syrg 12.5-25 g  25-50 mL IntraVENous PRN   ??? piperacillin-tazobactam (ZOSYN) 4.5 g in 0.9% sodium chloride (MBP/ADV) 100 mL  4.5 g IntraVENous Q8H   ??? levETIRAcetam (KEPPRA) 500 mg in 0.9% sodium chloride 100 mL IVPB  500 mg IntraVENous Q12H   ??? NOREPINephrine (LEVOPHED) 4 mg in 5% dextrose 250 mL infusion  2-16  mcg/min IntraVENous TITRATE         Objective:     Vitals:    04/17/17 1830 04/17/17 1902 04/17/17 1903 04/17/17 1918   BP: (!) 80/62 137/80  108/80   Pulse: 100 (!) 102  98   Resp: (!) 48  29 22   Temp:       SpO2: 93% 94%  95%   Weight:       Height:           PHYSICAL EXAM     Constitutional:  the patient is well developed and in no acute distress  EENMT:  Sclera clear, pupils equal, oral mucosa moist  Respiratory: fairly clear  Cardiovascular:  RRR without M,G,R  Gastrointestinal: soft and non-tender; with positive bowel sounds.  Musculoskeletal: warm without cyanosis.  There is slight lower leg edema.   Skin:  no jaundice or rashes; multiple ecchymoses  Neurologic: some intermittent L sized jerks  Psychiatric:  Postictal; intermittently arouses and phonate    Chest CT:      4/28:            FINDINGS:  There is extensive pulmonary embolism bilaterally, most marked in  the right lower lobe.  There is no definite pneumothorax.  No pathologically  enlarged lymph nodes or abnormal fluid collection is seen.  There has been  interval improvement in bilateral pulmonary parenchymal nodules since October  2017 with a residual 9 mm nodule medially at the right base.  Visualized hepatic  metastatic disease has also improved.  ??  IMPRESSION:      1.  EXTENSIVE BILATERAL PULMONARY EMBOLI.  2.  INTERVAL IMPROVEMENT IN PULMONARY PARENCHYMAL AND HEPATIC METASTATIC DISEASE  SINCE OCTOBER 2017."        Head CT     4/28:    CLINICAL HISTORY:  Seizure-like activity with no new intracranial metastases  from malignant melanoma.  ??  COMPARISON:  February 28, 2017.  ??  REPORT:   Multiple bilateral parenchymal metastases are again noted.  There is  less edema adjacent to the dominant left parietal metastasis.  No new or  enlarging masses evident.  There is no significant midline shift.  The  ventricles are normal in size and configuration, accounting for the patient's   age.  Orbits and  paranasal sinuses are clear where imaged. Bone windows  demonstrate no definite fracture or destruction.  ??  IMPRESSION    MULTIFOCAL METASTASES WITH LESS MASS EFFECT THAN ON MARCH 11.   NO NEW INTRACRANIAL ABNORMALITY IS IDENTIFIED.      Recent Labs      04/17/17   1225   WBC  13.6*   HGB  14.6   HCT  47.1*   PLT  212   INR  1.0     Recent Labs      04/17/17   1625  04/17/17   1225   NA   --   141   K   --   3.6   CL   --   99   GLU   --   384*   CO2   --   21   BUN   --   20   CREA   --   1.51*   MG   --   2.5*   CA   --   9.1   TROIQ  1.74*  1.26*   ALB   --   2.9*   TBILI   --   0.4   ALT   --   42   SGOT   --   31     Recent Labs      04/17/17   1748  04/17/17   1230   PH  7.33*  7.15*   PCO2  26*  22*   PO2  104  147*   HCO3  13*  7*     Recent Labs      04/17/17   1625   LAC  9.8*       Assessment:  (Medical Decision Making)     Hospital Problems  Date Reviewed: 04/19/17          Codes Class Noted POA    Seizure ([place]) ICD-10-CM: R56.9  ICD-9-CM: 780.39  04/17/2017 Yes    Multiple  brain mets on CT but stable    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 [facility]) (Chronic) ICD-10-CM: C78.7  ICD-9-CM: 197.7, 172.9  04/17/2017 Yes    Improved per CT    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 [facility]) (Chronic) ICD-10-CM: C78.00  ICD-9-CM: 197.0, 172.9  04/17/2017 Yes    Improved per CT    AKI (acute kidney injury) ([place]) ICD-10-CM: N17.9  ICD-9-CM: 584.9  04/17/2017 Yes        Demand ischemia ([place]) ICD-10-CM: I24.8  ICD-9-CM: 411.89  04/17/2017 Yes        Brain edema (HCC) (Chronic) ICD-10-CM: G93.6  ICD-9-CM: 348.5  04/17/2017 Yes    On chronic decadron    Acute respiratory failure ([place]) ICD-10-CM: J96.00  ICD-9-CM: 518.81  04/17/2017 Yes        SIRS (systemic inflammatory response syndrome) ([place]) ICD-10-CM: R65.10  ICD-9-CM: 995.90  04/17/2017 Yes        * (Principal)Acute pulmonary embolism ([place]) ICD-10-CM: I26.99  ICD-9-CM: 415.19  04/17/2017 Yes    Tolerating NC    Shock ([place]) ICD-10-CM: R57.9  ICD-9-CM: 785.50  04/17/2017 Yes     On low dose levophed. Acidosis, decadron use, and prior BP meds may be contributing at present.    Lactic acidosis ICD-10-CM: E87.2  ICD-9-CM: 276.2  04/17/2017 Unknown        Aphasia (Chronic) ICD-10-CM: R47.01  ICD-9-CM: 784.3  02/28/2017 Yes        Secondary malignant melanoma of brain ([place]) (Chronic) ICD-10-CM: C79.31  ICD-9-CM: 198.3  11/18/2016 Yes        HTN (hypertension) (Chronic) ICD-10-CM: I10  ICD-9-CM: 401.9  08/18/2013 Yes              Plan:  (Medical Decision Making)     Change to bicarb drip.  Add Florinef if able to place NG..  Protonix q12.  Small bore NG to R/O UGI bleed.  Serial H/H. Transfuse prn.  GI Consult. May need to stop heparin.  CVL only if she needs higher levophed or heparin stopped.  Serial ABG.  Serial labs.    --    More than 50% of the time documented was spent in face-to-face contact with the patient and in the care of the patient on the floor/unit where the patient is located.    Thank you very much for this referral.  We appreciate the opportunity to participate in this patient's care.  Will follow along with above stated plan.    Waverly Ferrari, MD

## 2017-04-17 NOTE — Progress Notes (Signed)
Called Dr. Despina Pole to verify ultrasound orders since not present in the computer. Orders for Stat BLE venous ultrasound obtained. Called Radiology department to notify.

## 2017-04-17 NOTE — Progress Notes (Signed)
TRANSFER - IN REPORT:    Verbal report received from [place], Therapist, sports (name) on Katelyn [name]  being received from [facility] ICU (unit) for change in patient condition(acute PE)      Report consisted of patient???s Situation, Background, Assessment and   Recommendations(SBAR).     Information from the following report(s) SBAR, Kardex, ED Summary, Procedure Summary, Intake/Output, MAR, Recent Results and Cardiac Rhythm sinus tach was reviewed with the receiving nurse.    Opportunity for questions and clarification was provided.      Assessment completed upon patient???s arrival to unit and care assumed.

## 2017-04-17 NOTE — Progress Notes (Signed)
Patient is more awake and alert upon reassessment. She is attempting to answer simple questions and is following simple commands. She is only oriented to person at this time and her speech is inappropriate at times. She is able to move BUE and RLE, but is having difficulty moving her LLE.

## 2017-04-17 NOTE — Progress Notes (Signed)
Patient arrived to unit with EMS via stretcher. Patient is disoriented and muttering incomprehensible words. Some seizure-like tremoring noted, particularly to LUE and LLE, MD aware. Patient will withdraw to pain with some spontaneous movement. Pupils 57mm, equal, round, and reactive to light. No command following. S1 and S2 heart sounds auscultated. Breath sounds clear and diminished bilaterally. Patient vomited tan emesis with undigested food particles twice upon turning. Abdomen semi-soft, rounded, and intact with active bowel sounds. Patient had two bloody bowel movements, MD aware. Foley catheter inserted and draining hazy, yellow urine. Palpable pulses present in all four extremities.    Dual skin assessment performed with Tally Due, RN. Skin is cool and dry with generalized petechiae and scattered bruising. Blanchable redness noted to bilateral heels. One skin tear is present on the left forearm and another skin tear is present on the right forearm. Both skin tears are dressed with xeroform and kling wrap. Sacrum is intact and a prophylactic allevyn is in place.

## 2017-04-17 NOTE — Progress Notes (Signed)
Pharmacokinetic Consult to Pharmacist    KATELIN KUTSCH is a 61 y.o. female being treated for sepsis with vancomycin and zosyn.          Lab Results   Component Value Date/Time    BUN 20 04/17/2017 12:25 PM    Creatinine 1.51 (H) 04/17/2017 12:25 PM    WBC 13.6 (H) 04/17/2017 12:25 PM    Lactic acid 9.8 (HH) 04/17/2017 04:25 PM    Lactic Acid (POC) 1.5 02/28/2017 10:19 AM      Estimated Creatinine Clearance: 38.5 mL/min (based on Cr of 1.51).    CULTURES:  -    Day 1 of vancomycin.  Goal trough is 15-20.  Vancomycin dose initiated at 1250mg  q 24h.   Will continue to follow patient.      Thank you,  Leonard Downing, PharmD  Clinical Pharmacist  [phone]

## 2017-04-17 NOTE — Progress Notes (Signed)
Chart accessed to assist primary RN.

## 2017-04-17 NOTE — Progress Notes (Signed)
Call received from Dr. Janith Lima. Chest CT results show bilateral PE. Dr. Quillian Quince paged, awaiting return phone call.

## 2017-04-17 NOTE — ED Notes (Signed)
Per Dr. Casimer Leek, hold Cardizem temporarily and give another Ativan 1mg  IV.  If heart rate doesn't decrease after Ativan then give Cardizem bolus.

## 2017-04-17 NOTE — Progress Notes (Addendum)
Verbal order received from Dr. Quillian Quince to start heparin gtt prior to CBC due to recent lab results. Heparin infusion started. Family updated at bedside by Dr. Quillian Quince.

## 2017-04-17 NOTE — ED Notes (Signed)
Per Dr. Casimer Leek, hold Cardizem due to patient's bp

## 2017-04-17 NOTE — Discharge Summary (Addendum)
Having a difficult time getting consistent BP readings despite multiple sites and attempts.  May need arterial line if pulmonary can do that with heparin gtt running.  I think she would be better served downtown in the ICU there.   She is not a candidate for TPA given her melanoma in the brain.  have called and spoken to Dr Shellee Milo who thinks she is probably not stable enough for any procedure at this point.   She appears to be much worse than she was earlier; legs are mottled, faint blue.  ABG is better but lactate is 9.8.      Start levophed because I do not think her last BP check of 91/78 MAP 83 is accurate given her appearance and lactic acidosis.  Give another bolus also.  Received 2L in ER.  will also check TTE, and switch abx to vanc/zosyn in case of concurrent infection. may be able to stop in a few days if blood cultures return negative.     Transferring patient downtown for closer monitoring.  Confirmed full code with husband for now though he will discuss with family.  Consult oncology also; appreciate help.

## 2017-04-17 NOTE — Progress Notes (Signed)
Patient clammy, cold. FSBS 392. Dr. Quillian Quince notified, MD to place orders.

## 2017-04-17 NOTE — H&P (Signed)
Please see notes from earlier today.

## 2017-04-17 NOTE — Progress Notes (Signed)
TRANSFER - OUT REPORT:    Verbal report given to [place], RN on Katelyn [name]  being transferred to [place] CCU bed 3304 for change in patient condition(acute PE)       Report consisted of patient???s Situation, Background, Assessment and   Recommendations(SBAR).     Information from the following report(s) SBAR, Kardex, ED Summary, MAR, Recent Results, Med Rec Status and Cardiac Rhythm Sinus tach was reviewed with the receiving nurse.    Lines:   Peripheral IV 04/17/17 Right Antecubital (Active)   Site Assessment Clean, dry, & intact 04/17/2017 12:29 PM   Phlebitis Assessment 0 04/17/2017 12:29 PM   Infiltration Assessment 0 04/17/2017 12:29 PM   Dressing Status Clean, dry, & intact 04/17/2017 12:29 PM        Opportunity for questions and clarification was provided.      Patient transported with:  Monitor, oxygen, IV pumps

## 2017-04-17 NOTE — Progress Notes (Signed)
Patient cleaned of large incontinent stool. BLE now mottled on thighs. Lactic acid 9.8. Dr. Quillian Quince notified, verbal order received for additional 1L NS. Bolus infusing now.

## 2017-04-17 NOTE — Progress Notes (Signed)
Patient admitted from ER into room 373, bathed with sage chlorhexidine wipes, and placed on monitors. Disoriented x4 upon admission, but within 15 mins oriented to self, year, and place.     Dual skin assessment performed with Doristine Church, RN. Patient with petechiae scattered over arms, abdomen, and back. Varicose veins present BLE. Large lateral incision to upper back (hx melanoma excision). Two skin tears- one right forearm, one left forearm- dressed with xeroform, 2x2, and kling wrap. Sacrum intact, alleyvn applied.

## 2017-04-17 NOTE — Progress Notes (Signed)
Paged Tye Dorado, NP with Heme/Onc to assess if patient requires special blood products due to history of cancer. No special products needed per NP. Notified [place] in blood bank.

## 2017-04-17 NOTE — Progress Notes (Signed)
MD called and notified of following changes: Patient now not responding to voice or sternal rub. Spontaneously moving right arm and leg, not following commands. Fluctuating/marginal hypotension. Last BP 91/78 MAP 83. Cap refill 5-6 seconds, patient clammy/cold.     Telephone order received for ABG and will transfer patient to downtown ICU. Family at bedside and updated.

## 2017-04-17 NOTE — Progress Notes (Signed)
1539 HR 140-150s, sinus tach on monitor. BP 167/112. 5 mg IV lopressor administered per prn order.     1545 HR 108. BP 125/75. Will continue to monitor.

## 2017-04-17 NOTE — Progress Notes (Signed)
Dr. Quillian Quince notified of chest CT results, MD to place orders.

## 2017-04-17 NOTE — Progress Notes (Addendum)
14 french dobhoff inserted into the right nare per Dr. Despina Pole. Stat KUB confirmed placement. Lavage performed to assess for blood in the upper GI tract. No blood noted upon lavage.    Dr. Ralph Dowdy at the bedside to assess patient for GI bleed. Instructed to call him if patient continues to have large bloody bowel movements.

## 2017-04-17 NOTE — ED Notes (Signed)
Patient in CT at this time.

## 2017-04-18 ENCOUNTER — Inpatient Hospital Stay
Admit: 2017-04-18 | Discharge: 2017-04-20 | Disposition: A | Discharge status: Hospice | Payer: PRIVATE HEALTH INSURANCE | Source: Other Acute Inpatient Hospital | Attending: Internal Medicine | Admitting: Internal Medicine

## 2017-04-18 DIAGNOSIS — G40901 Epilepsy, unspecified, not intractable, with status epilepticus: Secondary | ICD-10-CM

## 2017-04-18 LAB — BLOOD GAS, ARTERIAL
ALLENS TEST: POSITIVE
Arterial O2 Hgb: 94.6 % (ref 94–97)
BASE DEFICIT: 3.9 mmol/L — ABNORMAL HIGH (ref 0–2)
BICARBONATE: 21 mmol/L — ABNORMAL LOW (ref 22–26)
CARBOXYHEMOGLOBIN: 0.3 % — ABNORMAL LOW (ref 0.5–1.5)
DEOXYHEMOGLOBIN: 5 % (ref 0.0–5.0)
METHEMOGLOBIN: 0.5 % (ref 0.0–1.5)
O2 FLOW: 7 L/min
O2 SAT: 95 % (ref 92–98.5)
PCO2: 36 mmHg (ref 35–45)
PO2: 82 mmHg (ref 80–105)
TOTAL HEMOGLOBIN: 14.8 GM/DL (ref 11.7–15.0)
pH: 7.37 (ref 7.35–7.45)

## 2017-04-18 LAB — DRUG SCREEN, URINE
AMPHETAMINES: NEGATIVE
BARBITURATES: NEGATIVE
BENZODIAZEPINES: NEGATIVE
COCAINE: NEGATIVE
METHADONE: NEGATIVE
OPIATES: NEGATIVE
PCP(PHENCYCLIDINE): NEGATIVE
THC (TH-CANNABINOL): NEGATIVE

## 2017-04-18 LAB — HGB & HCT
HCT: 37 % (ref 35.8–46.3)
HGB: 11.8 g/dL (ref 11.7–15.4)

## 2017-04-18 LAB — CBC WITH AUTOMATED DIFF
ABS. BASOPHILS: 0 10*3/uL (ref 0.0–0.2)
ABS. EOSINOPHILS: 0 10*3/uL (ref 0.0–0.8)
ABS. IMM. GRANS.: 0.1 10*3/uL (ref 0.0–0.5)
ABS. LYMPHOCYTES: 1.2 10*3/uL (ref 0.5–4.6)
ABS. MONOCYTES: 0.5 10*3/uL (ref 0.1–1.3)
ABS. NEUTROPHILS: 8.2 10*3/uL (ref 1.7–8.2)
BASOPHILS: 0 % (ref 0.0–2.0)
EOSINOPHILS: 0 % — ABNORMAL LOW (ref 0.5–7.8)
HCT: 37.5 % (ref 35.8–46.3)
HGB: 12.8 g/dL (ref 11.7–15.4)
IMMATURE GRANULOCYTES: 1 % (ref 0.0–5.0)
LYMPHOCYTES: 12 % — ABNORMAL LOW (ref 13–44)
MCH: 30.1 PG (ref 26.1–32.9)
MCHC: 34.1 g/dL (ref 31.4–35.0)
MCV: 88.2 FL (ref 79.6–97.8)
MONOCYTES: 5 % (ref 4.0–12.0)
MPV: 8.7 FL — ABNORMAL LOW (ref 10.8–14.1)
NEUTROPHILS: 82 % — ABNORMAL HIGH (ref 43–78)
PLATELET: 128 10*3/uL — ABNORMAL LOW (ref 150–450)
RBC: 4.25 M/uL (ref 4.05–5.25)
RDW: 17.1 % — ABNORMAL HIGH (ref 11.9–14.6)
WBC: 10.1 10*3/uL (ref 4.3–11.1)

## 2017-04-18 LAB — LACTIC ACID
Lactic acid: 3.4 MMOL/L — CR (ref 0.4–2.0)
Lactic acid: 1.2 MMOL/L (ref 0.4–2.0)

## 2017-04-18 LAB — MAGNESIUM: Magnesium: 2 mg/dL (ref 1.8–2.4)

## 2017-04-18 LAB — PTT
aPTT: >200 s — CR (ref 23.2–35.3)
aPTT: >200 s — CR (ref 23.2–35.3)
aPTT: 172.4 s — CR (ref 23.2–35.3)

## 2017-04-18 LAB — EKG, 12 LEAD, INITIAL
Atrial Rate: 174 {beats}/min
Calculated R Axis: 90 degrees
Calculated T Axis: 43 degrees
Diagnosis: UNDETERMINED
Q-T Interval: 302 ms
QRS Duration: 98 ms
QTC Calculation (Bezet): 500 ms
Ventricular Rate: 165 {beats}/min

## 2017-04-18 LAB — TROPONIN I: Troponin-I, Qt.: 1.24 NG/ML — CR (ref 0.02–0.05)

## 2017-04-18 LAB — GLUCOSE, POC
Glucose (POC): 260 mg/dL — ABNORMAL HIGH (ref 65–100)
Glucose (POC): 127 mg/dL — ABNORMAL HIGH (ref 65–100)
Glucose (POC): 125 mg/dL — ABNORMAL HIGH (ref 65–100)

## 2017-04-18 LAB — METABOLIC PANEL, BASIC
Anion gap: 7 mmol/L (ref 7–16)
BUN: 15 MG/DL (ref 8–23)
CO2: 29 mmol/L (ref 21–32)
Calcium: 7 MG/DL — ABNORMAL LOW (ref 8.3–10.4)
Chloride: 107 mmol/L (ref 98–107)
Creatinine: 0.62 MG/DL (ref 0.6–1.0)
GFR est AA: >60 mL/min/{1.73_m2} (ref >60)
GFR est non-AA: >60 mL/min/{1.73_m2} (ref >60)
Glucose: 104 mg/dL — ABNORMAL HIGH (ref 65–100)
Potassium: 2.5 mmol/L — CL (ref 3.5–5.1)
Sodium: 143 mmol/L (ref 136–145)

## 2017-04-18 LAB — ALBUMIN: Albumin: 2.2 g/dL — ABNORMAL LOW (ref 3.2–4.6)

## 2017-04-18 LAB — PHOSPHORUS: Phosphorus: 2.9 MG/DL (ref 2.3–3.7)

## 2017-04-18 LAB — EKG 12-LEAD
Atrial Rate: 174 {beats}/min
Diagnosis: UNDETERMINED
Q-T Interval: 302 ms
QRS Duration: 98 ms
QTc Calculation (Bazett): 500 ms
R Axis: 90 degrees
T Axis: 43 degrees
Ventricular Rate: 165 {beats}/min

## 2017-04-18 LAB — ECHOCARDIOGRAM COMPLETE 2D W DOPPLER W COLOR: Left Ventricular Ejection Fraction: 53

## 2017-04-18 MED ORDER — ACETAMINOPHEN 325 MG TABLET
325 mg | Freq: Four times a day (QID) | ORAL | Status: DC | PRN
Start: 2017-04-18 — End: 2017-04-17

## 2017-04-18 MED ORDER — METOPROLOL TARTRATE 5 MG/5 ML IV SOLN
5 mg/ mL | Freq: Four times a day (QID) | INTRAVENOUS | Status: DC | PRN
Start: 2017-04-18 — End: 2017-04-20
  Administered 2017-04-18 – 2017-04-19 (×2): via INTRAVENOUS

## 2017-04-18 MED ORDER — NALOXONE 0.4 MG/ML INJECTION
0.4 mg/mL | INTRAMUSCULAR | Status: DC | PRN
Start: 2017-04-18 — End: 2017-04-20

## 2017-04-18 MED ORDER — ZOLPIDEM 5 MG TAB
5 mg | Freq: Every evening | ORAL | Status: DC | PRN
Start: 2017-04-18 — End: 2017-04-20

## 2017-04-18 MED ORDER — SODIUM CHLORIDE 0.9 % IV PIGGY BACK
4.5 gram | Freq: Three times a day (TID) | INTRAVENOUS | Status: DC
Start: 2017-04-18 — End: 2017-04-19
  Administered 2017-04-18 – 2017-04-19 (×5): via INTRAVENOUS

## 2017-04-18 MED ORDER — POTASSIUM CHLORIDE 10 % ORAL LIQUID
20 mEq/15 mL | Freq: Two times a day (BID) | ORAL | Status: DC
Start: 2017-04-18 — End: 2017-04-19
  Administered 2017-04-18 (×3): via ORAL

## 2017-04-18 MED ORDER — SIMVASTATIN 20 MG TAB
20 mg | Freq: Every evening | ORAL | Status: DC
Start: 2017-04-18 — End: 2017-04-20

## 2017-04-18 MED ORDER — VALPROATE SODIUM 100 MG/ML IV
5005100 mg/5 mL (100 mg/mL) | Freq: Once | INTRAVENOUS | Status: AC
Start: 2017-04-18 — End: 2017-04-20
  Administered 2017-04-19: 04:00:00 via INTRAVENOUS

## 2017-04-18 MED ORDER — DIPHENHYDRAMINE HCL 50 MG/ML IJ SOLN
50 mg/mL | INTRAMUSCULAR | Status: DC | PRN
Start: 2017-04-18 — End: 2017-04-20

## 2017-04-18 MED ORDER — HYDROCODONE-ACETAMINOPHEN 5 MG-325 MG TAB
5-325 mg | ORAL | Status: DC | PRN
Start: 2017-04-18 — End: 2017-04-20

## 2017-04-18 MED ORDER — DABRAFENIB 75 MG CAPSULE
75 mg | Freq: Two times a day (BID) | ORAL | Status: DC
Start: 2017-04-18 — End: 2017-04-20
  Administered 2017-04-20: 13:00:00 via ORAL

## 2017-04-18 MED ORDER — SODIUM CHLORIDE 0.9 % IV
INTRAVENOUS | Status: DC | PRN
Start: 2017-04-18 — End: 2017-04-20

## 2017-04-18 MED ORDER — DEXTROSE 5% IN WATER (D5W) IV
1 mEq/mL (8.4 %) | INTRAVENOUS | Status: DC
Start: 2017-04-18 — End: 2017-04-17

## 2017-04-18 MED ORDER — IBUPROFEN 200 MG TAB
200 mg | Freq: Four times a day (QID) | ORAL | Status: DC | PRN
Start: 2017-04-18 — End: 2017-04-20

## 2017-04-18 MED ORDER — SODIUM CHLORIDE 0.9 % INJECTION
40 mg | Freq: Two times a day (BID) | INTRAMUSCULAR | Status: DC
Start: 2017-04-18 — End: 2017-04-20
  Administered 2017-04-18 – 2017-04-20 (×6): via INTRAVENOUS

## 2017-04-18 MED ORDER — HYDRALAZINE 20 MG/ML IJ SOLN
20 mg/mL | Freq: Four times a day (QID) | INTRAMUSCULAR | Status: DC | PRN
Start: 2017-04-18 — End: 2017-04-20
  Administered 2017-04-19 – 2017-04-20 (×2): via INTRAVENOUS

## 2017-04-18 MED ORDER — INSULIN LISPRO 100 UNIT/ML INJECTION
100 unit/mL | Freq: Once | SUBCUTANEOUS | Status: AC
Start: 2017-04-18 — End: 2017-04-17
  Administered 2017-04-18: 01:00:00 via SUBCUTANEOUS

## 2017-04-18 MED ORDER — POLYETHYLENE GLYCOL 3350 17 GRAM (100 %) ORAL POWDER PACKET
17 gram | Freq: Every day | ORAL | Status: DC | PRN
Start: 2017-04-18 — End: 2017-04-20

## 2017-04-18 MED ORDER — NOREPINEPHRINE BITARTRATE 1 MG/ML IV
1 mg/mL | INTRAVENOUS | Status: DC
Start: 2017-04-18 — End: 2017-04-20

## 2017-04-18 MED ORDER — ACETAMINOPHEN 650 MG RECTAL SUPPOSITORY
650 mg | Freq: Four times a day (QID) | RECTAL | Status: DC | PRN
Start: 2017-04-18 — End: 2017-04-20

## 2017-04-18 MED ORDER — HYDROCODONE-ACETAMINOPHEN 10 MG-325 MG TAB
10-325 mg | ORAL | Status: DC | PRN
Start: 2017-04-18 — End: 2017-04-20

## 2017-04-18 MED ORDER — VALPROATE SODIUM 100 MG/ML IV
500 mg/5 mL (100 mg/mL) | Freq: Once | INTRAVENOUS | Status: AC
Start: 2017-04-18 — End: 2017-04-20
  Administered 2017-04-18: 17:00:00 via INTRAVENOUS

## 2017-04-18 MED ORDER — ONDANSETRON (PF) 4 MG/2 ML INJECTION
4 mg/2 mL | INTRAMUSCULAR | Status: DC | PRN
Start: 2017-04-18 — End: 2017-04-20

## 2017-04-18 MED ORDER — POTASSIUM CHLORIDE 20 MEQ/100 ML IV PIGGY BACK
20 mEq/100 mL | INTRAVENOUS | Status: AC
Start: 2017-04-18 — End: 2017-04-20
  Administered 2017-04-18 (×2): via INTRAVENOUS

## 2017-04-18 MED ORDER — INSULIN LISPRO 100 UNIT/ML INJECTION
100 unit/mL | Freq: Four times a day (QID) | SUBCUTANEOUS | Status: DC
Start: 2017-04-18 — End: 2017-04-20
  Administered 2017-04-20: 16:00:00 via SUBCUTANEOUS

## 2017-04-18 MED ORDER — LORAZEPAM 1 MG TAB
1 mg | ORAL | Status: DC | PRN
Start: 2017-04-18 — End: 2017-04-18

## 2017-04-18 MED ORDER — LEVETIRACETAM 500 MG/5 ML IV SOLN
5005 mg/5 mL | Freq: Two times a day (BID) | INTRAVENOUS | Status: DC
Start: 2017-04-18 — End: 2017-04-19
  Administered 2017-04-18 – 2017-04-19 (×3): via INTRAVENOUS

## 2017-04-18 MED ORDER — VANCOMYCIN IN 0.9 % SODIUM CHLORIDE 1.25 GRAM/250 ML IV
1.25 gram/250 mL | INTRAVENOUS | Status: DC
Start: 2017-04-18 — End: 2017-04-18
  Administered 2017-04-18: 02:00:00 via INTRAVENOUS

## 2017-04-18 MED ORDER — DEXTROMETHORPHAN-GUAIFENESIN 10 MG-100 MG/5 ML SYRUP
100-10 mg/5 mL | ORAL | Status: DC | PRN
Start: 2017-04-18 — End: 2017-04-20

## 2017-04-18 MED ORDER — PHARMACY VANCOMYCIN NOTE
Freq: Once | Status: AC
Start: 2017-04-18 — End: 2017-04-19
  Administered 2017-04-19: 13:00:00

## 2017-04-18 MED ORDER — LORAZEPAM 2 MG/ML IJ SOLN
2 mg/mL | Freq: Once | INTRAMUSCULAR | Status: AC
Start: 2017-04-18 — End: 2017-04-17

## 2017-04-18 MED ORDER — ACETAMINOPHEN 325 MG TABLET
325 mg | ORAL | Status: DC | PRN
Start: 2017-04-18 — End: 2017-04-20
  Administered 2017-04-18: 14:00:00 via ORAL

## 2017-04-18 MED ORDER — VANCOMYCIN 1,000 MG IV SOLR
1000 mg | Freq: Two times a day (BID) | INTRAVENOUS | Status: DC
Start: 2017-04-18 — End: 2017-04-19
  Administered 2017-04-18 – 2017-04-19 (×3): via INTRAVENOUS

## 2017-04-18 MED ORDER — LORAZEPAM 2 MG/ML IJ SOLN
2 mg/mL | INTRAMUSCULAR | Status: AC
Start: 2017-04-18 — End: 2017-04-18
  Administered 2017-04-18: 14:00:00 via INTRAVENOUS

## 2017-04-18 MED ORDER — METOPROLOL TARTRATE 5 MG/5 ML IV SOLN
5 mg/ mL | INTRAVENOUS | Status: DC | PRN
Start: 2017-04-18 — End: 2017-04-18

## 2017-04-18 MED ORDER — DEXTROSE 40 % ORAL GEL
40 % | ORAL | Status: DC | PRN
Start: 2017-04-18 — End: 2017-04-20

## 2017-04-18 MED ORDER — POTASSIUM CHLORIDE 20 MEQ/100 ML IV PIGGY BACK
20 mEq/100 mL | INTRAVENOUS | Status: DC
Start: 2017-04-18 — End: 2017-04-18

## 2017-04-18 MED ORDER — LORAZEPAM 2 MG/ML IJ SOLN
2 mg/mL | INTRAMUSCULAR | Status: DC | PRN
Start: 2017-04-18 — End: 2017-04-19
  Administered 2017-04-18: 23:00:00 via INTRAVENOUS

## 2017-04-18 MED ORDER — SODIUM CHLORIDE 0.9 % INJECTION
40 mg | Freq: Two times a day (BID) | INTRAMUSCULAR | Status: DC
Start: 2017-04-18 — End: 2017-04-17

## 2017-04-18 MED ORDER — BISACODYL 5 MG TAB, DELAYED RELEASE
5 mg | Freq: Every day | ORAL | Status: DC | PRN
Start: 2017-04-18 — End: 2017-04-20

## 2017-04-18 MED ORDER — SODIUM CHLORIDE 0.9 % IJ SYRG
Freq: Three times a day (TID) | INTRAMUSCULAR | Status: DC
Start: 2017-04-18 — End: 2017-04-20
  Administered 2017-04-18 – 2017-04-20 (×7): via INTRAVENOUS

## 2017-04-18 MED ORDER — LORAZEPAM 2 MG/ML IJ SOLN
2 mg/mL | INTRAMUSCULAR | Status: AC
Start: 2017-04-18 — End: 2017-04-17
  Administered 2017-04-18: 01:00:00 via INTRAVENOUS

## 2017-04-18 MED ORDER — SENNOSIDES-DOCUSATE SODIUM 8.6 MG-50 MG TAB
Freq: Every day | ORAL | Status: DC | PRN
Start: 2017-04-18 — End: 2017-04-20

## 2017-04-18 MED ORDER — GLUCAGON 1 MG INJECTION
1 mg | INTRAMUSCULAR | Status: DC | PRN
Start: 2017-04-18 — End: 2017-04-20

## 2017-04-18 MED ORDER — SODIUM CHLORIDE 0.9 % IV
INTRAVENOUS | Status: DC | PRN
Start: 2017-04-18 — End: 2017-04-17

## 2017-04-18 MED ORDER — SODIUM BICARBONATE 8.4 % IV
18.4 mEq/mL (8.4 %) | INTRAVENOUS | Status: DC
Start: 2017-04-18 — End: 2017-04-18
  Administered 2017-04-18: 02:00:00 via INTRAVENOUS

## 2017-04-18 MED ORDER — TRAMETINIB 2 MG TABLET
2 mg | Freq: Every day | ORAL | Status: DC
Start: 2017-04-18 — End: 2017-04-20
  Administered 2017-04-20: 13:00:00 via ORAL

## 2017-04-18 MED ORDER — SODIUM CHLORIDE 0.9 % IJ SYRG
INTRAMUSCULAR | Status: DC | PRN
Start: 2017-04-18 — End: 2017-04-20

## 2017-04-18 MED ORDER — HEPARIN (PORCINE) IN D5W 25,000 UNIT/500 ML IV
25000 unit/500 mL (50 unit/mL) | INTRAVENOUS | Status: DC
Start: 2017-04-18 — End: 2017-04-19
  Administered 2017-04-18 – 2017-04-19 (×7): via INTRAVENOUS

## 2017-04-18 MED ORDER — DEXTROSE 50% IN WATER (D50W) IV SYRG
INTRAVENOUS | Status: DC | PRN
Start: 2017-04-18 — End: 2017-04-20

## 2017-04-18 MED ORDER — DEXAMETHASONE SODIUM PHOSPHATE 4 MG/ML IJ SOLN
4 mg/mL | Freq: Four times a day (QID) | INTRAMUSCULAR | Status: DC
Start: 2017-04-18 — End: 2017-04-20
  Administered 2017-04-18 – 2017-04-20 (×11): via INTRAVENOUS

## 2017-04-18 MED ORDER — PERFLUTREN LIPID MICROSPHERES 1.1 MG/ML IV
1.1 mg/mL | INTRAVENOUS | Status: AC | PRN
Start: 2017-04-18 — End: 2017-04-18

## 2017-04-18 MED ORDER — POTASSIUM CHLORIDE 10 % ORAL LIQUID
20 mEq/15 mL | ORAL | Status: AC
Start: 2017-04-18 — End: 2017-04-18
  Administered 2017-04-18: 10:00:00 via NASOGASTRIC

## 2017-04-18 MED ORDER — SODIUM CHLORIDE 0.9 % IV
INTRAVENOUS | Status: DC
Start: 2017-04-18 — End: 2017-04-18
  Administered 2017-04-18 (×2): via INTRAVENOUS

## 2017-04-18 MED ORDER — SODIUM CHLORIDE 0.45 % IV
0.45 % | INTRAVENOUS | Status: DC
Start: 2017-04-18 — End: 2017-04-20
  Administered 2017-04-18 – 2017-04-19 (×2): via INTRAVENOUS

## 2017-04-18 MED ORDER — NUTRITIONAL PHARMACY SUPPORT ORDERS
Status: DC | PRN
Start: 2017-04-18 — End: 2017-04-20

## 2017-04-18 MED FILL — PHARMACY VANCOMYCIN NOTE: Qty: 1

## 2017-04-18 MED FILL — POTASSIUM CHLORIDE 10 % ORAL LIQUID: 20 mEq/15 mL | ORAL | Qty: 30

## 2017-04-18 MED FILL — LORAZEPAM 2 MG/ML IJ SOLN: 2 mg/mL | INTRAMUSCULAR | Qty: 1

## 2017-04-18 MED FILL — DEXTROSE 5% IN WATER (D5W) IV: INTRAVENOUS | Qty: 1000

## 2017-04-18 MED FILL — SODIUM CHLORIDE 0.9 % IV: INTRAVENOUS | Qty: 1000

## 2017-04-18 MED FILL — PIPERACILLIN-TAZOBACTAM 4.5 GRAM IV SOLR: 4.5 gram | INTRAVENOUS | Qty: 4.5

## 2017-04-18 MED FILL — PANTOPRAZOLE 40 MG IV SOLR: 40 mg | INTRAVENOUS | Qty: 40

## 2017-04-18 MED FILL — VANCOMYCIN 1,000 MG IV SOLR: 1000 mg | INTRAVENOUS | Qty: 1000

## 2017-04-18 MED FILL — SODIUM CHLORIDE 0.9 % IV: INTRAVENOUS | Qty: 250

## 2017-04-18 MED FILL — VALPROATE SODIUM 100 MG/ML IV: 500 mg/5 mL (100 mg/mL) | INTRAVENOUS | Qty: 10

## 2017-04-18 MED FILL — NOREPINEPHRINE BITARTRATE 1 MG/ML IV: 1 mg/mL | INTRAVENOUS | Qty: 4

## 2017-04-18 MED FILL — DEXAMETHASONE SODIUM PHOSPHATE 4 MG/ML IJ SOLN: 4 mg/mL | INTRAMUSCULAR | Qty: 1

## 2017-04-18 MED FILL — NUTRITIONAL PHARMACY SUPPORT ORDERS: Qty: 1

## 2017-04-18 MED FILL — METOPROLOL TARTRATE 5 MG/5 ML IV SOLN: 5 mg/ mL | INTRAVENOUS | Qty: 5

## 2017-04-18 MED FILL — TYLENOL 325 MG TABLET: 325 mg | ORAL | Qty: 2

## 2017-04-18 MED FILL — SODIUM CHLORIDE 0.45 % IV: 0.45 % | INTRAVENOUS | Qty: 1000

## 2017-04-18 MED FILL — LEVETIRACETAM 500 MG/5 ML IV SOLN: 500 mg/5 mL | INTRAVENOUS | Qty: 5

## 2017-04-18 MED FILL — DEFINITY 1.1 MG/ML INTRAVENOUS SUSPENSION: 1.1 mg/mL | INTRAVENOUS | Qty: 1.3

## 2017-04-18 MED FILL — POTASSIUM CHLORIDE 20 MEQ/100 ML IV PIGGY BACK: 20 mEq/100 mL | INTRAVENOUS | Qty: 100

## 2017-04-18 MED FILL — VANCOMYCIN IN 0.9 % SODIUM CHLORIDE 1.25 GRAM/250 ML IV: 1.25 gram/250 mL | INTRAVENOUS | Qty: 250

## 2017-04-18 NOTE — Consults (Signed)
Consults by  Greer Ee, MD at 04/18/17 1026                Author: Greer Ee, MD  Service: Hematology and Oncology  Author Type: Physician       Filed: 04/18/17 1040  Date of Service: 04/18/17 1026  Status: Signed          Editor: Greer Ee, MD (Physician)            Consult Orders        1. 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 Katelyn [name]  is a 61 y.o. female  admitted on 04/17/2017 with a primary diagnosis of Encounter Diagnoses        Name  Primary?         ?  Other acute pulmonary embolism without acute cor pulmonale (HCC)       ?  Acute respiratory failure with hypoxia ([place])       ?  Hypokalemia       ?  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  Secondary malignant melanoma of lung, unspecified laterality ([place])       ?  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  Seizure ([place])       .  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  She was doing extremely well, but yesterday developed acute onset of weakness and seizure-like activity, seen in [facility] ED and  transferred downtown, she was loaded with Keppra and given lorazepam with improvement.  She also had a possible right upper and left basilar infiltrate on CXR so she was taken for CT, which showed extensive bilateral PE as well as improvement in her metastatic  disease.  She had CT brain which showed no new hemorrhage and improvement in mass effect of known mets.  She was  transferred to [facility] ICU and we are consulted for recommendations.  Mentation is improved this AM although not to baseline, still having some  tremors/seizure-like activity in the extremities, but not generalized.  She is on UFH, she had 2 bloody BMs around her initial presentation but none since.           Review of Systems:   Unable to obtain due to patient condition.        Allergies        Allergen  Reactions         ?  Adhesive Tape-Silicones  Rash          Past Medical History:        Diagnosis  Date         ?  Cancer ([place])  melanoma - back         ?  Hypercholesterolemia       ?  Hypertension            managed with meds         ?  Nausea & vomiting           ?  Osteopenia            Past Surgical History:         Procedure  Laterality  Date          ?  HX COLONOSCOPY         ?  HX GYN              c-section x 5          ?  HX ORTHOPAEDIC  Left  04/30/2016          knee          ?  HX OTHER SURGICAL              melanoma removed from back          Family History         Problem  Relation  Age of Onset          ?  Hypertension  Mother       ?  Elevated Lipids  Mother       ?  Elevated Lipids  Father       ?  Hypertension  Father       ?  Arthritis-osteo  Father               spine          ?  Heart Disease  Father       ?  Breast Cancer  Other            ?  Diabetes  Sister            Social History          Social History         ?  Marital status:  MARRIED              Spouse name:  N/A         ?  Number of children:  N/A         ?  Years of education:  N/A          Occupational History        ?  Not on file.          Social History Main Topics         ?  Smoking status:  Never Smoker     ?  Smokeless tobacco:  Never Used     ?  Alcohol use  No     ?  Drug use:  Not on file         ?  Sexual activity:  Not on file           Other Topics  Concern        ?  Not on file          Social History Narrative          Current Facility-Administered Medications             Medication  Dose  Route  Frequency   Provider  Last Rate  Last Dose              ?  NUTRITIONAL SUPPORT ELECTROLYTE PRN ORDERS     Does Not Apply  PRN  Waverly Ferrari, MD           ?  0.45% sodium chloride infusion   50 mL/hr  IntraVENous  CONTINUOUS  Waverly Ferrari, MD  50 mL/hr at 04/18/17 0717  50 mL/hr at 04/18/17 0717     ?  potassium chloride (KAON 10%) 20 mEq/15 mL oral liquid 40 mEq   40 mEq  Oral  BID WITH MEALS  Myrtie Neither, MD     40 mEq at 04/18/17 1026              ?  metoprolol (LOPRESSOR) injection 2.5 mg   2.5 mg  IntraVENous  Q6H PRN  Greer Ee, MD     2.5 mg at 04/18/17 0851              ?  vancomycin (VANCOCIN) 1,000 mg in 0.9% sodium chloride (MBP/ADV) 250 mL   1 g  IntraVENous  Q12H  Margit Hanks, MD           ?  Derrill Memo ON 04/19/2017] [facility] TROUGH REMINDER     Other  ONCE  Margit Hanks, MD           ?  LORazepam (ATIVAN) injection 2 mg   2 mg  IntraVENous  Q2H PRN  Myrtie Neither, MD     2 mg at 04/18/17 1023     ?  acetaminophen (TYLENOL) suppository 650 mg   650 mg  Rectal  Q6H PRN  Elmer Ramp, MD           ?  NOREPINephrine (LEVOPHED) 4 mg in dextrose 5% 250 mL infusion   0.5-20 mcg/min  IntraVENous  TITRATE  Elmer Ramp, MD     Stopped at 04/17/17 2100     ?  piperacillin-tazobactam (ZOSYN) 4.5 g in 0.9% sodium chloride (MBP/ADV) 100 mL   4.5 g  IntraVENous  Q8H  Elmer Ramp, MD  25 mL/hr at 04/18/17 1025  4.5 g at 04/18/17 1025     ?  acetaminophen (TYLENOL) tablet 650 mg   650 mg  Oral  Q4H PRN  Elmer Ramp, MD     650 mg at 04/18/17 0944     ?  diphenhydrAMINE (BENADRYL) injection 25 mg   25 mg  IntraVENous  Q4H PRN  Elmer Ramp, MD           ?  naloxone [facility]) injection 0.4 mg   0.4 mg  IntraVENous  PRN  Elmer Ramp, MD           ?  ondansetron [facility]) injection 4 mg   4 mg  IntraVENous  Q4H PRN  Elmer Ramp, MD           ?  senna-docusate (PERICOLACE) 8.6-50 mg per tablet 2 Tab   2 Tab  Oral  DAILY PRN  Elmer Ramp, MD           ?  sodium  chloride (NS) flush 5-10 mL   5-10 mL  IntraVENous  Q8H  Elmer Ramp, MD     10 mL at 04/18/17 0549     ?  sodium chloride (NS) flush 5-10 mL   5-10 mL  IntraVENous  PRN  Elmer Ramp, MD           ?  zolpidem [facility]) tablet 5 mg  5 mg  Oral  QHS PRN  Elmer Ramp, MD           ?  heparin 25,000 units in dextrose 500 mL infusion   18-36 Units/kg/hr  IntraVENous  TITRATE  Elmer Ramp, MD     Stopped at 04/18/17 0848     ?  dextrose (D50W) injection syrg 12.5-25 g   25-50 mL  IntraVENous  PRN  Elmer Ramp, MD           ?  dextrose 40% (GLUTOSE) oral gel 1 Tube   15 g  Oral  PRN  Elmer Ramp, MD           ?  glucagon [facility]) injection 1 mg   1 mg  IntraMUSCular  PRN  Elmer Ramp, MD           ?  insulin lispro (HUMALOG) injection     SubCUTAneous  AC&HS  Elmer Ramp, MD     Stopped at 04/17/17 2200     ?  bisacodyl (DULCOLAX) tablet 5 mg   5 mg  Oral  DAILY PRN  Elmer Ramp, MD           ?  dabrafenib cap 150 mg   150 mg  Oral  BID  Elmer Ramp, MD     Stopped at 04/18/17 0900     ?  dexamethasone (DECADRON) 4 mg/mL injection 4 mg   4 mg  IntraVENous  Q6H  Elmer Ramp, MD     4 mg at 04/18/17 0549     ?  guaiFENesin-dextromethorphan (ROBITUSSIN DM) 100-10 mg/5 mL syrup 10 mL   10 mL  Oral  Q4H PRN  Elmer Ramp, MD           ?  hydrALAZINE (APRESOLINE) 20 mg/mL injection 20 mg   20 mg  IntraVENous  Q6H PRN  Elmer Ramp, MD           ?  HYDROcodone-acetaminophen [facility]) 5-325 mg per tablet 1 Tab   1 Tab  Oral  Q4H PRN  Elmer Ramp, MD           ?  HYDROcodone-acetaminophen [facility]) 10-325 mg tablet 1 Tab   1 Tab  Oral  Q4H PRN  Elmer Ramp, MD           ?  ibuprofen (MOTRIN) tablet 800 mg   800 mg  Oral  Q6H PRN  Elmer Ramp, MD           ?  polyethylene glycol [facility]) packet 17 g   17 g  Oral  DAILY PRN  Elmer Ramp, MD           ?  simvastatin (ZOCOR) tablet 20 mg   20 mg  Oral   QHS  Elmer Ramp, MD     Stopped at 04/17/17 2200     ?  trametinib tab 2 mg   2 mg  Oral  DAILY  Elmer Ramp, MD     Stopped at 04/18/17 0900     ?  0.9% sodium chloride infusion 250 mL   250 mL  IntraVENous  PRN  Waverly Ferrari, MD                    ?  levETIRAcetam (KEPPRA) 500 mg in 0.9% sodium chloride 100 mL IVPB   500 mg  IntraVENous  Q12H  Waverly Ferrari, MD     500 mg at 04/18/17 0050              ?  pantoprazole (PROTONIX) 40 mg in sodium chloride 0.9% 10 mL injection   40 mg  IntraVENous  Q12H  Emogene Morgan, MD     40 mg at 04/18/17 0851           OBJECTIVE:   Patient Vitals for the past 8 hrs:            BP  Temp  Pulse  Resp  SpO2     04/18/17 0947  (!) 185/110  -  (!) 107  26  96 %     04/18/17 0931  (!) 176/100  -  (!) 105  (!) 32  96 %     04/18/17 0918  (!) 170/100  -  (!) 102  (!) 92  95 %     04/18/17 0902  (!) 154/119  -  97  26  95 %     04/18/17 0851  (!) 167/107  -  (!) 112  -  -     04/18/17 0846  (!) 167/106  -  (!) 119  30  95 %     04/18/17 0831  (!) 163/98  -  (!) 117  23  96 %     04/18/17 0817  (!) 170/91  -  (!) 109  21  95 %     04/18/17 0802  (!) 172/105  -  (!) 108  27  96 %     04/18/17 0747  (!) 166/96  -  (!) 106  26  96 %     04/18/17 0731  (!) 159/105  -  (!) 109  21  97 %     04/18/17 0716  (!) 159/105  -  (!) 101  22  97 %     04/18/17 0701  128/88  98.5 ??F (36.9 ??C)  90  18  95 %     04/18/17 0632  (!) 140/95  -  (!) 102  20  97 %     04/18/17 0617  (!) 137/95  -  (!) 101  18  94 %     04/18/17 0601  (!) 132/96  -  95  25  95 %     04/18/17 0548  123/86  -  93  -  92 %     04/18/17 0531  121/90  -  90  22  92 %     04/18/17 0516  120/90  -  92  26  92 %     04/18/17 0501  120/84  -  96  23  92 %     04/18/17 0447  112/82  -  89  25  92 %     04/18/17 0432  120/87  -  94  25  91 %     04/18/17 0417  120/87  -  96  25  91 %     04/18/17 0401  111/73  -  96  30  93 %     04/18/17 0346  112/77  -  95  20  90 %     04/18/17 0331  118/67  -  97  20  91 %      04/18/17 0316  121/80  99.6 ??F (37.6 ??C)  99  15  93 %  04/18/17 0301  (!) 145/96  -  (!) 105  20  93 %     04/18/17 0247  (!) 129/94  -  (!) 110  29  96 %     04/18/17 0232  (!) 144/96  -  (!) 115  -  94 %        Temp (24hrs), Avg:98.1 ??F (36.7 ??C), Min:97.2 ??F (36.2 ??C), Max:99.6 ??F (37.6 ??C)             Physical Exam:      Constitutional:  Well developed, well nourished female in no acute  distress, sitting comfortably in the hospital bed.         HEENT:  Normocephalic and atraumatic. Oropharynx is clear, mucous membranes are moist.  Sclerae anicteric. Neck supple without JVD. No thyromegaly present.      Lymph node     No palpable submandibular, cervical, supraclavicular lymph nodes.     Skin  Warm and dry.  Multiple ecchymoses with some petechial appearing lesions over upper arms.  No erythema.  No pallor.      Respiratory  Lungs are clear to auscultation anteriorly without wheezes, rales or rhonchi, normal air exchange without accessory muscle use.      CVS  Normal rate, regular rhythm and normal S1 and S2.  No murmurs, gallops, or rubs.     Abdomen  Soft, nontender and nondistended, normoactive bowel sounds.  No palpable mass.  No hepatosplenomegaly.     Neuro  Lethargic, post-ictal.       MSK  Normal range of motion in general.  No edema and no tenderness.     Psych  Appropriate mood and affect.         Labs:       Recent Results (from the past 24 hour(s))     GLUCOSE, POC          Collection Time: 04/17/17 12:24 PM         Result  Value  Ref Range            Glucose (POC)  336 (H)  65 - 100 mg/dL       METABOLIC PANEL, COMPREHENSIVE          Collection Time: 04/17/17 12:25 PM         Result  Value  Ref Range            Sodium  141  136 - 145 mmol/L       Potassium  3.6  3.5 - 5.1 mmol/L       Chloride  99  98 - 107 mmol/L       CO2  21  21 - 32 mmol/L       Anion gap  21 (H)  7 - 16 mmol/L       Glucose  384 (H)  65 - 100 mg/dL       BUN  20  8 - 23 MG/DL       Creatinine  1.51 (H)  0.6 - 1.0 MG/DL        GFR est AA  45 (L)  >60 ml/min/1.23m       GFR est non-AA  37 (L)  >60 ml/min/1.75m      Calcium  9.1  8.3 - 10.4 MG/DL       Bilirubin, total  0.4  0.2 - 1.1 MG/DL       ALT (SGPT)  42  12 - 65 U/L  AST (SGOT)  31  15 - 37 U/L       Alk. phosphatase  90  50 - 136 U/L       Protein, total  6.7  6.3 - 8.2 g/dL       Albumin  2.9 (L)  3.2 - 4.6 g/dL       Globulin  3.8 (H)  2.3 - 3.5 g/dL       A-G Ratio  0.8 (L)  1.2 - 3.5         CBC WITH AUTOMATED DIFF          Collection Time: 04/17/17 12:25 PM         Result  Value  Ref Range            WBC  13.6 (H)  4.3 - 11.1 K/uL       RBC  5.03  4.05 - 5.25 M/uL       HGB  14.6  11.7 - 15.4 g/dL       HCT  47.1 (H)  35.8 - 46.3 %       MCV  93.6  79.6 - 97.8 FL       MCH  29.0  26.1 - 32.9 PG       MCHC  31.0 (L)  31.4 - 35.0 g/dL       RDW  17.2 (H)  11.9 - 14.6 %       PLATELET  212  150 - 450 K/uL       MPV  9.4 (L)  10.8 - 14.1 FL       NEUTROPHILS  57  47 - 75 %       LYMPHOCYTES  40  16 - 44 %       MONOCYTES  3  3 - 9 %       NRBC  2.0  PER 100 WBC       ABS. NEUTROPHILS  7.7  1.7 - 8.2 K/UL       ABS. LYMPHOCYTES  5.5 (H)  0.5 - 4.6 K/UL       ABS. MONOCYTES  0.4  0.1 - 1.3 K/UL       RBC COMMENTS  SLIGHT   ANISOCYTOSIS + POIKILOCYTOSIS             RBC COMMENTS  OCCASIONAL   POLYCHROMASIA             WBC COMMENTS  OCCASIONAL          PLATELET COMMENTS  ADEQUATE          DF  AUTOMATED          PROTHROMBIN TIME + INR          Collection Time: 04/17/17 12:25 PM         Result  Value  Ref Range            Prothrombin time  12.8  11.5 - 14.5 sec       INR  1.0          MAGNESIUM          Collection Time: 04/17/17 12:25 PM         Result  Value  Ref Range            Magnesium  2.5 (H)  1.8 - 2.4 mg/dL       TROPONIN I          Collection Time: 04/17/17 12:25 PM  Result  Value  Ref Range            Troponin-I, Qt.  1.26 (HH)  0.02 - 0.05 NG/ML       EKG, 12 LEAD, INITIAL          Collection Time: 04/17/17 12:27 PM         Result  Value  Ref Range             Ventricular Rate  165  BPM       Atrial Rate  174  BPM       QRS Duration  98  ms       Q-T Interval  302  ms       QTC Calculation (Bezet)  500  ms       Calculated R Axis  90  degrees       Calculated T Axis  43  degrees       Diagnosis                 Undetermined rhythm   Rightward axis   Incomplete right bundle branch block   Marked ST abnormality, possible inferior subendocardial injury   Abnormal ECG   When compared with ECG of 28-Feb-2017 10:21,   Significant changes have occurred          BLOOD GAS, ARTERIAL          Collection Time: 04/17/17 12:30 PM         Result  Value  Ref Range            pH  7.15 (L)  7.35 - 7.45         PCO2  22 (L)  35 - 45 mmHg       PO2  147 (H)  80 - 105 mmHg       BICARBONATE  7 (L)  22 - 26 mmol/L       BASE DEFICIT  19.6 (H)  0 - 2 mmol/L       TOTAL HEMOGLOBIN  14.8  11.7 - 15.0 GM/DL       O2 SAT  98  92 - 98.5 %       Arterial O2 Hgb  97.5 (H)  94 - 97 %       CARBOXYHEMOGLOBIN  0.0 (L)  0.5 - 1.5 %       METHEMOGLOBIN  0.3  0.0 - 1.5 %       DEOXYHEMOGLOBIN  2  0.0 - 5.0 %       SITE  LB         ALLENS TEST  NA         MODE  NRB         O2 FLOW  15.00  L/min       Respiratory comment:  Dr. Casimer Leek at 4 28 2018 12 35 53 PM. Read back.         TROPONIN I          Collection Time: 04/17/17  4:25 PM         Result  Value  Ref Range            Troponin-I, Qt.  1.74 (HH)  0.02 - 0.05 NG/ML       CK          Collection Time: 04/17/17  4:25 PM         Result  Value  Ref Range  CK  83  21 - 215 U/L       CULTURE, BLOOD          Collection Time: 04/17/17  4:25 PM         Result  Value  Ref Range            Special Requests:  NO SPECIAL REQUESTS   RIGHT   ARM             Culture result:  NO GROWTH AFTER 14 HOURS          CULTURE, BLOOD          Collection Time: 04/17/17  4:25 PM         Result  Value  Ref Range            Special Requests:  NO SPECIAL REQUESTS   RIGHT   ARM             Culture result:  NO GROWTH AFTER 14 HOURS          LACTIC ACID          Collection  Time: 04/17/17  4:25 PM         Result  Value  Ref Range            Lactic acid  9.8 (HH)  0.4 - 2.0 MMOL/L       PTT          Collection Time: 04/17/17  4:25 PM         Result  Value  Ref Range            aPTT  26.1  23.2 - 35.3 SEC       GLUCOSE, POC          Collection Time: 04/17/17  4:40 PM         Result  Value  Ref Range            Glucose (POC)  392 (H)  65 - 100 mg/dL       BLOOD GAS, ARTERIAL          Collection Time: 04/17/17  5:48 PM         Result  Value  Ref Range            pH  7.33 (L)  7.35 - 7.45         PCO2  26 (L)  35 - 45 mmHg       PO2  104  80 - 105 mmHg       BICARBONATE  13 (L)  22 - 26 mmol/L       BASE DEFICIT  10.9 (H)  0 - 2 mmol/L       SITE  LR         ALLENS TEST  POSITIVE          MODE  HFNC         O2 FLOW  9.00  L/min       Respiratory comment:  Dr. Quillian Quince at 4 28 2018 5 54 36 PM. Read back.         GLUCOSE, POC          Collection Time: 04/17/17  8:41 PM         Result  Value  Ref Range            Glucose (POC)  260 (H)  65 - 100 mg/dL       BLOOD  GAS, ARTERIAL          Collection Time: 04/17/17  9:14 PM         Result  Value  Ref Range            pH  7.37  7.35 - 7.45         PCO2  36  35 - 45 mmHg       PO2  82  80 - 105 mmHg       BICARBONATE  21 (L)  22 - 26 mmol/L       BASE DEFICIT  3.9 (H)  0 - 2 mmol/L       TOTAL HEMOGLOBIN  14.8  11.7 - 15.0 GM/DL       O2 SAT  95  92 - 98.5 %       Arterial O2 Hgb  94.6  94 - 97 %       CARBOXYHEMOGLOBIN  0.3 (L)  0.5 - 1.5 %       METHEMOGLOBIN  0.5  0.0 - 1.5 %       DEOXYHEMOGLOBIN  5  0.0 - 5.0 %       SITE  RR         ALLENS TEST  POSITIVE          MODE  NC         O2 FLOW  7.00  L/min       Respiratory comment:  Ann, RN at 4 28 2018 9 24 36 PM. Read back.         LACTIC ACID          Collection Time: 04/17/17  9:27 PM         Result  Value  Ref Range            Lactic acid  3.4 (HH)  0.4 - 2.0 MMOL/L       HGB & HCT          Collection Time: 04/17/17  9:27 PM         Result  Value  Ref Range            HGB  11.8  11.7 - 15.4 g/dL        HCT  37.0  35.8 - 46.3 %       DRUG SCREEN, URINE          Collection Time: 04/17/17  9:34 PM         Result  Value  Ref Range            PCP(PHENCYCLIDINE)  NEGATIVE           BENZODIAZEPINES  NEGATIVE           COCAINE  NEGATIVE           AMPHETAMINES  NEGATIVE           METHADONE  NEGATIVE           THC (TH-CANNABINOL)  NEGATIVE           OPIATES  NEGATIVE           BARBITURATES  NEGATIVE           TYPE + CROSSMATCH          Collection Time: 04/17/17  9:55 PM         Result  Value  Ref Range            Crossmatch Expiration  04/20/2017  ABO/Rh(D)  A POSITIVE         Antibody screen  NEG         Unit number  [id number]         Blood component type  RC LR         Unit division  00         Status of unit  ALLOCATED         Crossmatch result  Compatible         Unit number  [id number]         Blood component type  RC LR         Unit division  00         Status of unit  ALLOCATED         Crossmatch result  Compatible         Unit number  [id number]         Blood component type  RC LR         Unit division  00         Status of unit  ALLOCATED         Crossmatch result  Compatible         PTT          Collection Time: 04/17/17 10:01 PM         Result  Value  Ref Range            aPTT  >200.0 (HH)  23.2 - 35.3 SEC       TROPONIN I          Collection Time: 04/17/17 11:30 PM         Result  Value  Ref Range            Troponin-I, Qt.  1.24 (HH)  0.02 - 0.05 NG/ML       MAGNESIUM          Collection Time: 04/18/17  4:03 AM         Result  Value  Ref Range            Magnesium  2.0  1.8 - 2.4 mg/dL       METABOLIC PANEL, BASIC          Collection Time: 04/18/17  4:03 AM         Result  Value  Ref Range            Sodium  143  136 - 145 mmol/L       Potassium  2.5 (LL)  3.5 - 5.1 mmol/L       Chloride  107  98 - 107 mmol/L       CO2  29  21 - 32 mmol/L       Anion gap  7  7 - 16 mmol/L       Glucose  104 (H)  65 - 100 mg/dL       BUN  15  8 - 23 MG/DL       Creatinine  0.62  0.6 - 1.0 MG/DL       GFR  est AA  >60  >60 ml/min/1.31m       GFR est non-AA  >60  >60 ml/min/1.715m      Calcium  7.0 (L)  8.3 - 10.4 MG/DL       CBC WITH AUTOMATED DIFF          Collection Time: 04/18/17  4:03  AM         Result  Value  Ref Range            WBC  10.1  4.3 - 11.1 K/uL       RBC  4.25  4.05 - 5.25 M/uL       HGB  12.8  11.7 - 15.4 g/dL       HCT  37.5  35.8 - 46.3 %       MCV  88.2  79.6 - 97.8 FL       MCH  30.1  26.1 - 32.9 PG       MCHC  34.1  31.4 - 35.0 g/dL       RDW  17.1 (H)  11.9 - 14.6 %       PLATELET  128 (L)  150 - 450 K/uL       MPV  8.7 (L)  10.8 - 14.1 FL       DF  AUTOMATED          NEUTROPHILS  82 (H)  43 - 78 %       LYMPHOCYTES  12 (L)  13 - 44 %       MONOCYTES  5  4.0 - 12.0 %       EOSINOPHILS  0 (L)  0.5 - 7.8 %       BASOPHILS  0  0.0 - 2.0 %       IMMATURE GRANULOCYTES  1  0.0 - 5.0 %       ABS. NEUTROPHILS  8.2  1.7 - 8.2 K/UL       ABS. LYMPHOCYTES  1.2  0.5 - 4.6 K/UL       ABS. MONOCYTES  0.5  0.1 - 1.3 K/UL       ABS. EOSINOPHILS  0.0  0.0 - 0.8 K/UL       ABS. BASOPHILS  0.0  0.0 - 0.2 K/UL       ABS. IMM. GRANS.  0.1  0.0 - 0.5 K/UL       PHOSPHORUS          Collection Time: 04/18/17  4:03 AM         Result  Value  Ref Range            Phosphorus  2.9  2.3 - 3.7 MG/DL       LACTIC ACID          Collection Time: 04/18/17  4:03 AM         Result  Value  Ref Range            Lactic acid  1.2  0.4 - 2.0 MMOL/L       ALBUMIN          Collection Time: 04/18/17  4:03 AM         Result  Value  Ref Range            Albumin  2.2 (L)  3.2 - 4.6 g/dL       PTT          Collection Time: 04/18/17  6:54 AM         Result  Value  Ref Range            aPTT  >200.0 (HH)  23.2 - 35.3 SEC       GLUCOSE, POC          Collection Time: 04/18/17  7:30  AM         Result  Value  Ref Range            Glucose (POC)  127 (H)  65 - 100 mg/dL           Imaging:   No images are attached to the encounter.      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      Pertinent old records were reviewed.       Case discussed with Dr. Rollene Rotunda.      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  She has had intermittent episodes, almost like a TIA in presentation, that have resolved after 36-48 hours.  None of these had really been associated  with seizure-like activity until now, but this should certainly be considered as a possible etiology - now on Keppra and Neurology consulted.  PE noted on CT, agree with UFH especially given GI bleed noted on admission, Hgb stable now and GI following.   Transition to oral anticoagulation once stable.  Monitor petechial rash, her platelets are adequate on CBC today.  HTN per hospitalists, on Levophed briefly but now running high, PRN hydralazine and metoprolol available.  Hold Taf/Mek until she passes  swallow eval, then resume.        Thank you for allowing me to participate in the care of Ms.  [name].  We will follow along with you.                Greer Ee, MD   [facility] Hematology [place] [address]   Office : [phone]   Fax : [phone]

## 2017-04-18 NOTE — Progress Notes (Signed)
Critical Care Daily Progress Note: 04/18/2017    Katelyn [name]   Admission Date: 04/17/2017         The patient's chart is reviewed and the patient is discussed with the staff.    61 yo WF with hx of stage 4 melanoma (Brain, lung, liver) admitted with sz and pulmonary embolism. Some GI bleeding on heparin.       Subjective:     Hemodynamically stable off pressors. No further large bloody BMs. Heparin on hold for high INR. Venous US + for     Current Facility-Administered Medications   Medication Dose Route Frequency   ??? potassium chloride 20 mEq in 100 ml IVPB  20 mEq IntraVENous Q2H   ??? NUTRITIONAL SUPPORT ELECTROLYTE PRN ORDERS   Does Not Apply PRN   ??? acetaminophen (TYLENOL) suppository 650 mg  650 mg Rectal Q6H PRN   ??? NOREPINephrine (LEVOPHED) 4 mg in dextrose 5% 250 mL infusion  0.5-20 mcg/min IntraVENous TITRATE   ??? piperacillin-tazobactam (ZOSYN) 4.5 g in 0.9% sodium chloride (MBP/ADV) 100 mL  4.5 g IntraVENous Q8H   ??? acetaminophen (TYLENOL) tablet 650 mg  650 mg Oral Q4H PRN   ??? diphenhydrAMINE (BENADRYL) injection 25 mg  25 mg IntraVENous Q4H PRN   ??? LORazepam (ATIVAN) tablet 1 mg  1 mg Oral Q4H PRN   ??? naloxone (NARCAN) injection 0.4 mg  0.4 mg IntraVENous PRN   ??? ondansetron (ZOFRAN) injection 4 mg  4 mg IntraVENous Q4H PRN   ??? senna-docusate (PERICOLACE) 8.6-50 mg per tablet 2 Tab  2 Tab Oral DAILY PRN   ??? sodium chloride (NS) flush 5-10 mL  5-10 mL IntraVENous Q8H   ??? sodium chloride (NS) flush 5-10 mL  5-10 mL IntraVENous PRN   ??? zolpidem (AMBIEN) tablet 5 mg  5 mg Oral QHS PRN   ??? heparin 25,000 units in dextrose 500 mL infusion  18-36 Units/kg/hr IntraVENous TITRATE   ??? dextrose (D50W) injection syrg 12.5-25 g  25-50 mL IntraVENous PRN   ??? dextrose 40% (GLUTOSE) oral gel 1 Tube  15 g Oral PRN   ??? glucagon (GLUCAGEN) injection 1 mg  1 mg IntraMUSCular PRN   ??? insulin lispro (HUMALOG) injection   SubCUTAneous AC&HS   ??? 0.9% sodium chloride infusion  125 mL/hr IntraVENous CONTINUOUS    ??? bisacodyl (DULCOLAX) tablet 5 mg  5 mg Oral DAILY PRN   ??? dabrafenib cap 150 mg  150 mg Oral BID   ??? dexamethasone (DECADRON) 4 mg/mL injection 4 mg  4 mg IntraVENous Q6H   ??? guaiFENesin-dextromethorphan (ROBITUSSIN DM) 100-10 mg/5 mL syrup 10 mL  10 mL Oral Q4H PRN   ??? hydrALAZINE (APRESOLINE) 20 mg/mL injection 20 mg  20 mg IntraVENous Q6H PRN   ??? HYDROcodone-acetaminophen (NORCO) 5-325 mg per tablet 1 Tab  1 Tab Oral Q4H PRN   ??? HYDROcodone-acetaminophen (NORCO) 10-325 mg tablet 1 Tab  1 Tab Oral Q4H PRN   ??? ibuprofen (MOTRIN) tablet 800 mg  800 mg Oral Q6H PRN   ??? metoprolol (LOPRESSOR) injection 5 mg  5 mg IntraVENous Q4H PRN   ??? polyethylene glycol (MIRALAX) packet 17 g  17 g Oral DAILY PRN   ??? simvastatin (ZOCOR) tablet 20 mg  20 mg Oral QHS   ??? trametinib tab 2 mg  2 mg Oral DAILY   ??? 0.9% sodium chloride infusion 250 mL  250 mL IntraVENous PRN   ??? levETIRAcetam (KEPPRA) 500 mg in 0.9% sodium chloride 100  mL IVPB  500 mg IntraVENous Q12H   ??? pantoprazole (PROTONIX) 40 mg in sodium chloride 0.9% 10 mL injection  40 mg IntraVENous Q12H   ??? sodium bicarbonate (8.4%) 150 mEq in dextrose 5% 1,000 mL infusion   IntraVENous CONTINUOUS   ??? vancomycin (VANCOCIN) 1250 mg in NS 250 ml infusion  1,250 mg IntraVENous Q24H       Review of Systems    Constitutional:  negative for fever, chills, sweats  Cardiovascular:  negative for chest pain, palpitations, syncope, edema  Gastrointestinal:  negative for dysphagia, reflux, vomiting, diarrhea, abdominal pain, or melena  Neurologic:  negative for focal weakness, numbness, headache      Objective:     Vitals:    04/18/17 0531 04/18/17 0548 04/18/17 0601 04/18/17 0617   BP: 121/90 123/86 (!) 132/96 (!) 137/95   Pulse: 90 93 95 (!) 101   Resp: 22  25 18    Temp:       SpO2: 92% 92% 95% 94%   Weight:           Intake and Output:      04/28 1901 - 04/29 0700  In: 2341.5 [I.V.:2291.5]  Out: 1325 [Urine:1325]    Physical Exam:           Constitutional:  the patient is well developed and in no acute distress on NC  EENMT:  Sclera clear, pupils equal, oral mucosa moist  Respiratory: clear  Cardiovascular:  RRR without M,G,R  Gastrointestinal: soft and non-tender; with positive bowel sounds.  Musculoskeletal: warm without cyanosis. There is trace lower leg edema.  Skin:  no jaundice or rashes; scattered ecchymoses  Neurologic: no gross neuro deficits     Psychiatric:  Arouses but not interactive    LINES:  PIV    DRIPS:   Heparin     CXR:     4/28:        Venous US:    FINDINGS: There is compressibility, color-flow assignment and augmentation of  the venous waveform with calf compression in the right common femoral,  superficial femoral and popliteal veins. There is evidence of thrombus in the  left calf veins. The left deep venous system is otherwise normal.  ??  IMPRESSION     Acute left calf vein deep vein thrombosis.    LAB  Recent Labs      04/17/17   2041  04/17/17   1640  04/17/17   1224   GLUCPOC  260*  392*  336*      Recent Labs      04/18/17   0403  04/17/17   2127  04/17/17   1225   WBC  10.1   --   13.6*   HGB  12.8  11.8  14.6   HCT  37.5  37.0  47.1*   PLT  128*   --   212   INR   --    --   1.0     Recent Labs      04/18/17   0403  04/17/17   2330  04/17/17   1625  04/17/17   1225   NA  143   --    --   141   K  2.5*   --    --   3.6   CL  107   --    --   99   CO2  29   --    --   21  GLU  104*   --    --   384*   BUN  15   --    --   20   CREA  0.62   --    --   1.51*   MG  2.0   --    --   2.5*   PHOS  2.9   --    --    --    CA  7.0*   --    --   9.1   TROIQ   --   1.24*  1.74*  1.26*   ALB   --    --    --   2.9*   TBILI   --    --    --   0.4   ALT   --    --    --   42   SGOT   --    --    --   31     Recent Labs      04/17/17   2114  04/17/17   1748  04/17/17   1230   PH  7.37  7.33*  7.15*   PCO2  36  26*  22*   PO2  82  104  147*   HCO3  21*  13*  7*     Recent Labs      04/18/17   0403  04/17/17   2127  04/17/17   1625    LAC  1.2  3.4*  9.8*       Assessment:  (Medical Decision Making)     Hospital Problems  Date Reviewed: 2017/05/03          Codes Class Noted POA    Hypokalemia ICD-10-CM: E87.6  ICD-9-CM: 276.8  04/18/2017 Unknown    Repleting    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          Plan:  (Medical Decision Making)     Continue heparin.  Neurology eval in AM.  Follow H/H.  Lactate now normal.  Replete K  --    More than 50% of the time documented was spent in face-to-face contact with the patient and in the care of the patient on the floor/unit where the patient is located.    Waverly Ferrari, MD

## 2017-04-18 NOTE — Progress Notes (Addendum)
Bedside shift change report given to Lovena Le, RN (oncoming nurse) by Margreta Journey, RN (offgoing nurse). Report included the following information SBAR, Kardex, ED Summary, Procedure Summary, Intake/Output, MAR, Recent Results and Cardiac Rhythm ST.       Heparin gtt signed off

## 2017-04-18 NOTE — Progress Notes (Signed)
Patient has been married to [place] for 41 years.    Carolynn Comment, staff chaplain, [place], [place]  /   [email or web address]

## 2017-04-18 NOTE — Progress Notes (Signed)
Teleneuro at bedside.

## 2017-04-18 NOTE — Progress Notes (Signed)
Hospitalist Progress Note    04/18/2017  Admit Date: 04/17/2017  8:30 PM   NAME: Katelyn [name]   DOB:  08/11/1956   MRN:  [id number]   Attending: Margit Hanks, MD  PCP:  Dianna Rossetti, MD    SUBJECTIVE:     Katelyn [name] is a 61 years old female with pmhx of melanoma with metastatic disease to liver, lung, and brain with brain edema/seizures admitted on 4/28 for acute convulsive seizures with bilateral pulmonary emboli, left acute LE DVT and hemodynamic instability. Pt was admitted on SFE and transferred to [facility] ICU for further care. Pt was also noted to have GI bleeding, for which GI was consulted.   Pt is currently on heparin drip for bilateral PE and acute left LE DVT, IV keppra for seizures, IV zosyn/vanc for bacterial coverage, IV decadron for brain mets with edema.      4/29:  Pt seen at bedside  Pt's husband, son and daughter present at bedside.  Pt is having intermittent tremors with left hand appearing in contracture.  Appearing confused, occasionally answering questions.  Discussed with the patient's family about multiple medical comorbidities complicating pt's overall health.      Review of Systems negative with exception of pertinent positives noted above      PHYSICAL EXAM       Visit Vitals   ??? BP (!) 159/105   ??? Pulse (!) 101   ??? Temp 98.5 ??F (36.9 ??C)   ??? Resp 22   ??? Wt 68.6 kg (151 lb 3.8 oz)   ??? SpO2 97%   ??? Breastfeeding No   ??? BMI 23.69 kg/m2      Temp (24hrs), Avg:98.1 ??F (36.7 ??C), Min:97.2 ??F (36.2 ??C), Max:99.6 ??F (37.6 ??C)    Oxygen Therapy  O2 Sat (%): 97 % (04/18/17 0716)  Pulse via Oximetry: 102 beats per minute (04/18/17 0716)  O2 Device: Hi flow nasal cannula (04/18/17 0316)  O2 Flow Rate (L/min): 8 l/min (04/18/17 0447)    Intake/Output Summary (Last 24 hours) at 04/18/17 0724  Last data filed at 04/18/17 0558   Gross per 24 hour   Intake          2341.52 ml   Output             1325 ml   Net          1016.52 ml          General: Lethargic, confused, tremulous   HEENT:           Head NCAT, PERRLA+, no pallor or ict  Lungs:  CTA Bilaterally.   CVS:  Tachycardic, regular rhythm,?? No murmur, rub, or gallop, No JVD, No lower extremity edema.  Abdomen: Soft, Non distended, Non tender, Positive bowel sounds.  MSK:  No deformities, lesions, Spontaneously moves extremities.  Neurologic:?? GCS 11, following commands intermittently  Psychiatry:      Orientation and mood could not be assessed  Skin:   No rash/lesions. Good skin turgor  Heme/Lymph/Immune:  Has petechiae in UE, no ecchymoses      Recent Results (from the past 24 hour(s))   GLUCOSE, POC    Collection Time: 04/17/17 12:24 PM   Result Value Ref Range    Glucose (POC) 336 (H) 65 - 784 mg/dL   METABOLIC PANEL, COMPREHENSIVE    Collection Time: 04/17/17 12:25 PM   Result Value Ref Range    Sodium 141 136 - 145 mmol/L  Potassium 3.6 3.5 - 5.1 mmol/L    Chloride 99 98 - 107 mmol/L    CO2 21 21 - 32 mmol/L    Anion gap 21 (H) 7 - 16 mmol/L    Glucose 384 (H) 65 - 100 mg/dL    BUN 20 8 - 23 MG/DL    Creatinine 1.51 (H) 0.6 - 1.0 MG/DL    GFR est AA 45 (L) >60 ml/min/1.61m    GFR est non-AA 37 (L) >60 ml/min/1.733m   Calcium 9.1 8.3 - 10.4 MG/DL    Bilirubin, total 0.4 0.2 - 1.1 MG/DL    ALT (SGPT) 42 12 - 65 U/L    AST (SGOT) 31 15 - 37 U/L    Alk. phosphatase 90 50 - 136 U/L    Protein, total 6.7 6.3 - 8.2 g/dL    Albumin 2.9 (L) 3.2 - 4.6 g/dL    Globulin 3.8 (H) 2.3 - 3.5 g/dL    A-G Ratio 0.8 (L) 1.2 - 3.5     CBC WITH AUTOMATED DIFF    Collection Time: 04/17/17 12:25 PM   Result Value Ref Range    WBC 13.6 (H) 4.3 - 11.1 K/uL    RBC 5.03 4.05 - 5.25 M/uL    HGB 14.6 11.7 - 15.4 g/dL    HCT 47.1 (H) 35.8 - 46.3 %    MCV 93.6 79.6 - 97.8 FL    MCH 29.0 26.1 - 32.9 PG    MCHC 31.0 (L) 31.4 - 35.0 g/dL    RDW 17.2 (H) 11.9 - 14.6 %    PLATELET 212 150 - 450 K/uL    MPV 9.4 (L) 10.8 - 14.1 FL    NEUTROPHILS 57 47 - 75 %    LYMPHOCYTES 40 16 - 44 %    MONOCYTES 3 3 - 9 %    NRBC 2.0 PER 100 WBC     ABS. NEUTROPHILS 7.7 1.7 - 8.2 K/UL    ABS. LYMPHOCYTES 5.5 (H) 0.5 - 4.6 K/UL    ABS. MONOCYTES 0.4 0.1 - 1.3 K/UL    RBC COMMENTS SLIGHT  ANISOCYTOSIS + POIKILOCYTOSIS        RBC COMMENTS OCCASIONAL  POLYCHROMASIA        WBC COMMENTS OCCASIONAL      PLATELET COMMENTS ADEQUATE      DF AUTOMATED     PROTHROMBIN TIME + INR    Collection Time: 04/17/17 12:25 PM   Result Value Ref Range    Prothrombin time 12.8 11.5 - 14.5 sec    INR 1.0     MAGNESIUM    Collection Time: 04/17/17 12:25 PM   Result Value Ref Range    Magnesium 2.5 (H) 1.8 - 2.4 mg/dL   TROPONIN I    Collection Time: 04/17/17 12:25 PM   Result Value Ref Range    Troponin-I, Qt. 1.26 (HH) 0.02 - 0.05 NG/ML   EKG, 12 LEAD, INITIAL    Collection Time: 04/17/17 12:27 PM   Result Value Ref Range    Ventricular Rate 165 BPM    Atrial Rate 174 BPM    QRS Duration 98 ms    Q-T Interval 302 ms    QTC Calculation (Bezet) 500 ms    Calculated R Axis 90 degrees    Calculated T Axis 43 degrees    Diagnosis       Undetermined rhythm  Rightward axis  Incomplete right bundle branch block  Marked ST abnormality, possible inferior subendocardial injury  Abnormal ECG  When compared with ECG of 28-Feb-2017 10:21,  Significant changes have occurred     BLOOD GAS, ARTERIAL    Collection Time: 04/17/17 12:30 PM   Result Value Ref Range    pH 7.15 (L) 7.35 - 7.45      PCO2 22 (L) 35 - 45 mmHg    PO2 147 (H) 80 - 105 mmHg    BICARBONATE 7 (L) 22 - 26 mmol/L    BASE DEFICIT 19.6 (H) 0 - 2 mmol/L    TOTAL HEMOGLOBIN 14.8 11.7 - 15.0 GM/DL    O2 SAT 98 92 - 98.5 %    Arterial O2 Hgb 97.5 (H) 94 - 97 %    CARBOXYHEMOGLOBIN 0.0 (L) 0.5 - 1.5 %    METHEMOGLOBIN 0.3 0.0 - 1.5 %    DEOXYHEMOGLOBIN 2 0.0 - 5.0 %    SITE LB     ALLENS TEST NA     MODE NRB     O2 FLOW 15.00 L/min    Respiratory comment: Dr. Casimer Leek at 4 28 2018 12 35 53 PM. Read back.    TROPONIN I    Collection Time: 04/17/17  4:25 PM   Result Value Ref Range    Troponin-I, Qt. 1.74 (HH) 0.02 - 0.05 NG/ML   CK     Collection Time: 04/17/17  4:25 PM   Result Value Ref Range    CK 83 21 - 215 U/L   LACTIC ACID    Collection Time: 04/17/17  4:25 PM   Result Value Ref Range    Lactic acid 9.8 (HH) 0.4 - 2.0 MMOL/L   PTT    Collection Time: 04/17/17  4:25 PM   Result Value Ref Range    aPTT 26.1 23.2 - 35.3 SEC   GLUCOSE, POC    Collection Time: 04/17/17  4:40 PM   Result Value Ref Range    Glucose (POC) 392 (H) 65 - 100 mg/dL   BLOOD GAS, ARTERIAL    Collection Time: 04/17/17  5:48 PM   Result Value Ref Range    pH 7.33 (L) 7.35 - 7.45      PCO2 26 (L) 35 - 45 mmHg    PO2 104 80 - 105 mmHg    BICARBONATE 13 (L) 22 - 26 mmol/L    BASE DEFICIT 10.9 (H) 0 - 2 mmol/L    SITE LR     ALLENS TEST POSITIVE      MODE HFNC     O2 FLOW 9.00 L/min    Respiratory comment: Dr. Quillian Quince at 4 28 2018 5 54 36 PM. Read back.    GLUCOSE, POC    Collection Time: 04/17/17  8:41 PM   Result Value Ref Range    Glucose (POC) 260 (H) 65 - 100 mg/dL   BLOOD GAS, ARTERIAL    Collection Time: 04/17/17  9:14 PM   Result Value Ref Range    pH 7.37 7.35 - 7.45      PCO2 36 35 - 45 mmHg    PO2 82 80 - 105 mmHg    BICARBONATE 21 (L) 22 - 26 mmol/L    BASE DEFICIT 3.9 (H) 0 - 2 mmol/L    TOTAL HEMOGLOBIN 14.8 11.7 - 15.0 GM/DL    O2 SAT 95 92 - 98.5 %    Arterial O2 Hgb 94.6 94 - 97 %    CARBOXYHEMOGLOBIN 0.3 (L) 0.5 - 1.5 %    METHEMOGLOBIN 0.5 0.0 -  1.5 %    DEOXYHEMOGLOBIN 5 0.0 - 5.0 %    SITE RR     ALLENS TEST POSITIVE      MODE NC     O2 FLOW 7.00 L/min    Respiratory comment: Ann, RN at 4 28 2018 9 24 36 PM. Read back.    LACTIC ACID    Collection Time: 04/17/17  9:27 PM   Result Value Ref Range    Lactic acid 3.4 (HH) 0.4 - 2.0 MMOL/L   HGB & HCT    Collection Time: 04/17/17  9:27 PM   Result Value Ref Range    HGB 11.8 11.7 - 15.4 g/dL    HCT 37.0 35.8 - 46.3 %   DRUG SCREEN, URINE    Collection Time: 04/17/17  9:34 PM   Result Value Ref Range    PCP(PHENCYCLIDINE) NEGATIVE       BENZODIAZEPINES NEGATIVE       COCAINE NEGATIVE        AMPHETAMINES NEGATIVE       METHADONE NEGATIVE       THC (TH-CANNABINOL) NEGATIVE       OPIATES NEGATIVE       BARBITURATES NEGATIVE      TYPE + CROSSMATCH    Collection Time: 04/17/17  9:55 PM   Result Value Ref Range    Crossmatch Expiration 04/20/2017     ABO/Rh(D) A POSITIVE     Antibody screen NEG     Unit number [id number]     Blood component type RC LR     Unit division 00     Status of unit ALLOCATED     Crossmatch result Compatible     Unit number [id number]     Blood component type RC LR     Unit division 00     Status of unit ALLOCATED     Crossmatch result Compatible     Unit number [id number]     Blood component type RC LR     Unit division 00     Status of unit ALLOCATED     Crossmatch result Compatible    PTT    Collection Time: 04/17/17 10:01 PM   Result Value Ref Range    aPTT >200.0 (HH) 23.2 - 35.3 SEC   TROPONIN I    Collection Time: 04/17/17 11:30 PM   Result Value Ref Range    Troponin-I, Qt. 1.24 (HH) 0.02 - 0.05 NG/ML   MAGNESIUM    Collection Time: 04/18/17  4:03 AM   Result Value Ref Range    Magnesium 2.0 1.8 - 2.4 mg/dL   METABOLIC PANEL, BASIC    Collection Time: 04/18/17  4:03 AM   Result Value Ref Range    Sodium 143 136 - 145 mmol/L    Potassium 2.5 (LL) 3.5 - 5.1 mmol/L    Chloride 107 98 - 107 mmol/L    CO2 29 21 - 32 mmol/L    Anion gap 7 7 - 16 mmol/L    Glucose 104 (H) 65 - 100 mg/dL    BUN 15 8 - 23 MG/DL    Creatinine 0.62 0.6 - 1.0 MG/DL    GFR est AA >60 >60 ml/min/1.4m    GFR est non-AA >60 >60 ml/min/1.7105m   Calcium 7.0 (L) 8.3 - 10.4 MG/DL   CBC WITH AUTOMATED DIFF    Collection Time: 04/18/17  4:03 AM   Result Value Ref Range    WBC 10.1 4.3 - 11.1  K/uL    RBC 4.25 4.05 - 5.25 M/uL    HGB 12.8 11.7 - 15.4 g/dL    HCT 37.5 35.8 - 46.3 %    MCV 88.2 79.6 - 97.8 FL    MCH 30.1 26.1 - 32.9 PG    MCHC 34.1 31.4 - 35.0 g/dL    RDW 17.1 (H) 11.9 - 14.6 %    PLATELET 128 (L) 150 - 450 K/uL    MPV 8.7 (L) 10.8 - 14.1 FL    DF AUTOMATED       NEUTROPHILS 82 (H) 43 - 78 %    LYMPHOCYTES 12 (L) 13 - 44 %    MONOCYTES 5 4.0 - 12.0 %    EOSINOPHILS 0 (L) 0.5 - 7.8 %    BASOPHILS 0 0.0 - 2.0 %    IMMATURE GRANULOCYTES 1 0.0 - 5.0 %    ABS. NEUTROPHILS 8.2 1.7 - 8.2 K/UL    ABS. LYMPHOCYTES 1.2 0.5 - 4.6 K/UL    ABS. MONOCYTES 0.5 0.1 - 1.3 K/UL    ABS. EOSINOPHILS 0.0 0.0 - 0.8 K/UL    ABS. BASOPHILS 0.0 0.0 - 0.2 K/UL    ABS. IMM. GRANS. 0.1 0.0 - 0.5 K/UL   PHOSPHORUS    Collection Time: 04/18/17  4:03 AM   Result Value Ref Range    Phosphorus 2.9 2.3 - 3.7 MG/DL   LACTIC ACID    Collection Time: 04/18/17  4:03 AM   Result Value Ref Range    Lactic acid 1.2 0.4 - 2.0 MMOL/L   ALBUMIN    Collection Time: 04/18/17  4:03 AM   Result Value Ref Range    Albumin 2.2 (L) 3.2 - 4.6 g/dL         Imaging /Procedures /Studies   All diagnostic imaging personally reviewed by me.    BILATERAL LOWER EXTREMITY VENOUS DUPLEX ULTRASOUND.  ??  HISTORY:  Pain.  ??  TECHNIQUE: Direct skin-contact high resolution grayscale images, color-flow  Doppler and duplex waveform analysis.  ??  FINDINGS: There is compressibility, color-flow assignment and augmentation of  the venous waveform with calf compression in the right common femoral,  superficial femoral and popliteal veins. There is evidence of thrombus in the  left calf veins. The left deep venous system is otherwise normal.  ??  IMPRESSION  IMPRESSION: Acute left calf vein deep vein thrombosis.    Abdomen x-ray single view.  ??  HISTORY: Dobbhoff tube placement.  ??  COMPARISON: None.  ??  FINDINGS: Dobbhoff tube its tip in the gastric body. Bowel gas pattern is  nonspecific.  ??  IMPRESSION  IMPRESSION:  ??  Dobbhoff tube is tip in the gastric body.      CT chest w cont:  IMPRESSION:    1.  EXTENSIVE BILATERAL PULMONARY EMBOLI.  2.  INTERVAL IMPROVEMENT IN PULMONARY PARENCHYMAL AND HEPATIC METASTATIC DISEASE  SINCE OCTOBER 2017."      NONCONTRAST HEAD CT  ??  CLINICAL HISTORY:  Seizure-like activity with no new intracranial metastases   from malignant melanoma.  ??  COMPARISON:  February 28, 2017.  ??  REPORT:   Multiple bilateral parenchymal metastases are again noted.  There is  less edema adjacent to the dominant left parietal metastasis.  No new or  enlarging masses evident.  There is no significant midline shift.  The  ventricles are normal in size and configuration, accounting for the patient's  age.  Orbits and  paranasal sinuses are clear where  imaged. Bone windows  demonstrate no definite fracture or destruction.  ??  IMPRESSION  IMPRESSION:     MULTIFOCAL METASTASES WITH LESS MASS EFFECT THAN ON MARCH 11.   NO NEW INTRACRANIAL ABNORMALITY IS IDENTIFIED.    PORTABLE CHEST, April 17, 2017 at 1246 hours  ??  CLINICAL HISTORY:  Shortness of breath and seizure-like activity today with  metastatic melanoma.  ??  COMPARISON:  March 12, 2017.  ??  FINDINGS:  AP erect image demonstrates no confluent infiltrate or significant  pleural fluid.  There is mild left basilar and medial right upper lobe  atelectasis/infiltrate.  The heart size is within normal limits without evidence  of congestive heart failure or pneumothorax.  The bony thorax appears intact on  this view.  There are overlying radiopaque support devices.  ??  IMPRESSION  IMPRESSION:  MILD RIGHT UPPER LOBE AND LEFT BASILAR ATELECTASIS/INFILTRATE.    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 ([facility]) (Chronic) ICD-10-CM: C78.00  ICD-9-CM: 197.0, 172.9  04/17/2017 - Present Yes         Acute respiratory failure ([place]) ICD-10-CM: J96.00  ICD-9-CM: 518.81  04/17/2017 - Present Yes        * (Principal)Acute pulmonary embolism ([place]) ICD-10-CM: I26.99  ICD-9-CM: 415.19  04/17/2017 - Present Yes        Shock ([place]) ICD-10-CM: R57.9  ICD-9-CM: 785.50  04/17/2017 - Present Yes        Lactic acidosis ICD-10-CM: E87.2  ICD-9-CM: 276.2  04/17/2017 - Present Yes                  Plan:  - Patient is likely in Partial SE  Added IV depakene, 1000 mg once followed by 500 mg IV BID  Continue Keppra 500 mg IV q12h  EEG will be ordered.  teleneuro consulted, discussed the case with Dr. Fredda Hammed.  Prn ativan for agitation and sedation    - continue IV heparin for Bilateral PE and left acute DVT  High risk of intracranial bleed.  - tachypnea and tachycardia likely coming from PE with agitation due to tremors/seizures  Weaned off vasopressors.    - elevated troponin likely from demand ischemia due to acute bilateral PE. No chest pain reported.    - hypokalemia of 2.5, getting IV and oral replacements. Recheck in AM.  - lactic acidosis resolved, 1.2  - metastatic melanoma with brain, liver, lung meds: extremely poor prognosis, oral chemoRx on hold for now as pt is at high risk of aspiration. NPO for now  Continue decadron for brain mets  I have discussed the poor prognosis of cancer with new ongoing health issues.  Patient's family is aware of potential decline in pt's health and would like the pt to be comfortable if she doesn't improve.    - speech eval is pending  -  empiric antibiotics, IV vanc and zosyn, concerns for aspiration PNA.        DVT Prophylaxis: heparin drip  Continue monitoring in ICU. Pt is at high risk of rapid decompensation.      Myrtie Neither, MD

## 2017-04-18 NOTE — Progress Notes (Signed)
Dr. Jenetta Loges at bedside. OK to hold chemo medications at this time until patient able to tolerate PO.

## 2017-04-18 NOTE — Progress Notes (Signed)
Had a follow up visit with husband about end of life.  Please be aware of his needs during this time:    Husband has made no plans for arrangements -  Assured spouse that chaplain would inform nursing supervisor/ staff so body could be placed in morgue.  He said it may be a couple of days after death before they decide and assured him that's ok.    Provided husband with a listing of local funeral homes as requested.  He may pick one and this may eliminate need for body to be in morgue.    Guided husband thru comfort care phase - patient has family that is mostly out of state and if they need to be here before beginning comfort measures he needs to let staff know.    Suggested to husband that he have a family meeting with staff before beginning comfort measures to insure that all understand the patient's prognosis and also our mission for providing comfort care.    Husband was very thankful for our time together.  He has already started having conversations with the family locally.    Chaplain has talked with Nurse Lovena Le, Nursing Supervisor Ivin Booty and Dr. Rollene Rotunda about steps being taken so we can all be on same page.    Carolynn Comment, staff chaplain, [place], [place]  /   [email or web address]

## 2017-04-18 NOTE — Progress Notes (Signed)
Drucilla Chalet notified of patient status and family needs.

## 2017-04-18 NOTE — Progress Notes (Signed)
Patient with active tremors around 10:30. Echo will be attempted at a later time.

## 2017-04-18 NOTE — Progress Notes (Signed)
.  [facility] Consult Note             Date: 04/18/2017    GI Problem: Hematochezia    History of Present Illness:  Patient is a 61 y.o. 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  Came in with seizure.  Chest CT showed extensive bilateral PE's.  Started on heparin gtt.  Had small bloody bm at [facility], and another larger one here last night.  No further hematochezia since despite being on heparin gtt.  Hgb stable at 12.  NG was lavaged and no blood seen.      Hospital Medications:  Current Facility-Administered Medications   Medication Dose Route Frequency   ??? potassium chloride 20 mEq in 100 ml IVPB  20 mEq IntraVENous Q2H   ??? NUTRITIONAL SUPPORT ELECTROLYTE PRN ORDERS   Does Not Apply PRN   ??? 0.45% sodium chloride infusion  50 mL/hr IntraVENous CONTINUOUS   ??? potassium chloride (KAON 10%) 20 mEq/15 mL oral liquid 40 mEq  40 mEq Oral BID WITH MEALS   ??? acetaminophen (TYLENOL) suppository 650 mg  650 mg Rectal Q6H PRN   ??? NOREPINephrine (LEVOPHED) 4 mg in dextrose 5% 250 mL infusion  0.5-20 mcg/min IntraVENous TITRATE   ??? piperacillin-tazobactam (ZOSYN) 4.5 g in 0.9% sodium chloride (MBP/ADV) 100 mL  4.5 g IntraVENous Q8H   ??? acetaminophen (TYLENOL) tablet 650 mg  650 mg Oral Q4H PRN   ??? diphenhydrAMINE (BENADRYL) injection 25 mg  25 mg IntraVENous Q4H PRN   ??? LORazepam (ATIVAN) tablet 1 mg  1 mg Oral Q4H PRN   ??? naloxone (NARCAN) injection 0.4 mg  0.4 mg IntraVENous PRN   ??? ondansetron (ZOFRAN) injection 4 mg  4 mg IntraVENous Q4H PRN   ??? senna-docusate (PERICOLACE) 8.6-50 mg per tablet 2 Tab  2 Tab Oral DAILY PRN   ??? sodium chloride (NS) flush 5-10 mL  5-10 mL IntraVENous Q8H   ??? sodium chloride (NS) flush 5-10 mL  5-10 mL IntraVENous PRN   ??? zolpidem (AMBIEN) tablet 5 mg  5 mg Oral QHS PRN   ??? heparin 25,000 units in dextrose 500 mL infusion  18-36 Units/kg/hr IntraVENous TITRATE    ??? dextrose (D50W) injection syrg 12.5-25 g  25-50 mL IntraVENous PRN   ??? dextrose 40% (GLUTOSE) oral gel 1 Tube  15 g Oral PRN   ??? glucagon (GLUCAGEN) injection 1 mg  1 mg IntraMUSCular PRN   ??? insulin lispro (HUMALOG) injection   SubCUTAneous AC&HS   ??? bisacodyl (DULCOLAX) tablet 5 mg  5 mg Oral DAILY PRN   ??? dabrafenib cap 150 mg  150 mg Oral BID   ??? dexamethasone (DECADRON) 4 mg/mL injection 4 mg  4 mg IntraVENous Q6H   ??? guaiFENesin-dextromethorphan (ROBITUSSIN DM) 100-10 mg/5 mL syrup 10 mL  10 mL Oral Q4H PRN   ??? hydrALAZINE (APRESOLINE) 20 mg/mL injection 20 mg  20 mg IntraVENous Q6H PRN   ??? HYDROcodone-acetaminophen (NORCO) 5-325 mg per tablet 1 Tab  1 Tab Oral Q4H PRN   ??? HYDROcodone-acetaminophen (NORCO) 10-325 mg tablet 1 Tab  1 Tab Oral Q4H PRN   ??? ibuprofen (MOTRIN) tablet 800 mg  800 mg Oral Q6H PRN   ??? metoprolol (LOPRESSOR) injection 5 mg  5 mg IntraVENous Q4H PRN   ??? polyethylene glycol (MIRALAX) packet 17 g  17 g Oral DAILY PRN   ??? simvastatin (ZOCOR) tablet 20 mg  20  mg Oral QHS   ??? trametinib tab 2 mg  2 mg Oral DAILY   ??? 0.9% sodium chloride infusion 250 mL  250 mL IntraVENous PRN   ??? levETIRAcetam (KEPPRA) 500 mg in 0.9% sodium chloride 100 mL IVPB  500 mg IntraVENous Q12H   ??? pantoprazole (PROTONIX) 40 mg in sodium chloride 0.9% 10 mL injection  40 mg IntraVENous Q12H   ??? vancomycin (VANCOCIN) 1250 mg in NS 250 ml infusion  1,250 mg IntraVENous Q24H       Objective:     Physical Exam:  Vitals:  Visit Vitals   ??? BP (!) 172/105   ??? Pulse (!) 108   ??? Temp 98.5 ??F (36.9 ??C)   ??? Resp 27   ??? Wt 68.6 kg (151 lb 3.8 oz)   ??? SpO2 96%   ??? Breastfeeding No   ??? BMI 23.69 kg/m2       General: No acute distress.  Skin:  Extremities and face reveal no rashes. No palmer erythema. No telangiectasias on the chest wall.  HEENT: Sclerae anicteric. No oral ulcers.  No abnormal pigmentation of the lips.  The neck is supple.  Cardiovascular: Regular rate and rhythm. No murmurs, gallops, or rubs.   Respiratory:  Comfortable breathing  With no accessory muscle use. Clear breath sounds with no wheezes, rales, or rhonchi.  GI:  Abdomen nondistended, soft, and nontender.  Normal active bowel sounds. No enlargement of the liver or spleen. No masses palpable.  Musculoskeletal:  No pitting edema of the lower legs.  Extremities have good range of motion.   Neurological:  Gross memory appears intact.  Patient is alert and oriented.  Psychiatric:  Mood appears appropriate with judgement intact.  Lymphatic:  No cervical or supraclavicular adenopathy.    Laboratory:    Recent Labs      04/18/17   0403   WBC  10.1   RBC  4.25   HGB  12.8   HCT  37.5   PLT  128*      Recent Labs      04/18/17   0403   GLU  104*   NA  143   K  2.5*   CL  107   CO2  29   BUN  15   CREA  0.62   CA  7.0*     Recent Labs      04/18/17   0654   04/17/17   1225   PTP   --    --   12.8   INR   --    --   1.0   APTT  PENDING   < >   --     < > = values in this interval not displayed.     Recent Labs      04/18/17   0403  04/17/17   1225   SGOT   --   31   AP   --   90   ALB  2.2*  2.9*   TP   --   6.7       Assessment:       A 61 y.o. female with metastatic melanoma and PE's on heparin gtt with hematochezia yesterday that has stopped for now      Plan:       If the bleeding returns will do colonoscopy, however if no bleeding even while fully anticoagulated then little benefit in scoping in present condition  Signed By: Emogene Morgan, MD     April 18, 2017

## 2017-04-18 NOTE — Progress Notes (Signed)
Bedside shift change report given to Margreta Journey, Therapist, sports (oncoming nurse) by Lovena Le, RN (offgoing nurse). Report included the following information SBAR, Kardex, ED Summary, Procedure Summary, Intake/Output, MAR, Recent Results and Cardiac Rhythm ST.    Heparin gtt signed off.

## 2017-04-18 NOTE — Consults (Signed)
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 female admitted on 04/17/2017 with a primary diagnosis of Encounter Diagnoses   Name Primary?   ??? Other acute pulmonary embolism without acute cor pulmonale ([place])    ??? Acute respiratory failure with hypoxia ([place])    ??? Hypokalemia    ??? 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 Secondary malignant melanoma of lung, unspecified laterality ([place])    ??? 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 Seizure ([place])    .  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  She was doing extremely well, but yesterday developed acute onset of weakness and seizure-like activity, seen in [facility] ED and transferred downtown, she was loaded with Keppra and given lorazepam with improvement.  She also had a possible right upper and left basilar infiltrate on CXR so she was taken for CT, which showed extensive bilateral PE as well as improvement in her metastatic disease.  She had CT brain which showed no new hemorrhage and improvement in mass effect of known mets.  She was transferred to [facility] ICU and we are consulted for recommendations.  Mentation is improved this AM although not to baseline, still having some tremors/seizure-like activity in the extremities, but not generalized.  She is on UFH, she had 2 bloody BMs around her initial presentation but none since.        Review of Systems:  Unable to obtain due to patient condition.    Allergies   Allergen Reactions   ??? Adhesive Tape-Silicones Rash     Past Medical History:   Diagnosis Date    ??? Cancer ([place])     melanoma - back   ??? Hypercholesterolemia    ??? Hypertension     managed with meds   ??? Nausea & vomiting    ??? Osteopenia      Past Surgical History:   Procedure Laterality Date   ??? HX COLONOSCOPY     ??? HX GYN      c-section x 5   ??? HX ORTHOPAEDIC Left 04/30/2016    knee   ??? HX OTHER SURGICAL      melanoma removed from back     Family History   Problem Relation Age of Onset   ??? Hypertension Mother    ??? Elevated Lipids Mother    ??? Elevated Lipids Father    ??? Hypertension Father    ??? Arthritis-osteo Father      spine   ??? Heart Disease Father    ??? Breast Cancer Other    ??? Diabetes Sister      Social History     Social History   ??? Marital status: MARRIED     Spouse name: N/A   ??? Number of children: N/A   ??? Years of education: N/A     Occupational History   ??? Not on file.     Social History Main Topics   ??? Smoking status: Never Smoker   ??? Smokeless tobacco: Never Used   ??? Alcohol use  No   ??? Drug use: Not on file   ??? Sexual activity: Not on file     Other Topics Concern   ??? Not on file     Social History Narrative     Current Facility-Administered Medications   Medication Dose Route Frequency Provider Last Rate Last Dose   ??? NUTRITIONAL SUPPORT ELECTROLYTE PRN ORDERS   Does Not Apply PRN Waverly Ferrari, MD       ??? 0.45% sodium chloride infusion  50 mL/hr IntraVENous CONTINUOUS Waverly Ferrari, MD 50 mL/hr at 04/18/17 0717 50 mL/hr at 04/18/17 0717   ??? potassium chloride (KAON 10%) 20 mEq/15 mL oral liquid 40 mEq  40 mEq Oral BID WITH MEALS Myrtie Neither, MD   40 mEq at 04/18/17 1026   ??? metoprolol (LOPRESSOR) injection 2.5 mg  2.5 mg IntraVENous Q6H PRN Greer Ee, MD   2.5 mg at 04/18/17 0851   ??? vancomycin (VANCOCIN) 1,000 mg in 0.9% sodium chloride (MBP/ADV) 250 mL  1 g IntraVENous Q12H Margit Hanks, MD       ??? [START ON 04/19/2017] [facility] TROUGH REMINDER   Other ONCE Margit Hanks, MD       ??? LORazepam (ATIVAN) injection 2 mg  2 mg IntraVENous Q2H PRN Myrtie Neither, MD   2 mg at 04/18/17 1023    ??? acetaminophen (TYLENOL) suppository 650 mg  650 mg Rectal Q6H PRN Elmer Ramp, MD       ??? NOREPINephrine (LEVOPHED) 4 mg in dextrose 5% 250 mL infusion  0.5-20 mcg/min IntraVENous TITRATE Elmer Ramp, MD   Stopped at 04/17/17 2100   ??? piperacillin-tazobactam (ZOSYN) 4.5 g in 0.9% sodium chloride (MBP/ADV) 100 mL  4.5 g IntraVENous Q8H Elmer Ramp, MD 25 mL/hr at 04/18/17 1025 4.5 g at 04/18/17 1025   ??? acetaminophen (TYLENOL) tablet 650 mg  650 mg Oral Q4H PRN Elmer Ramp, MD   650 mg at 04/18/17 0944   ??? diphenhydrAMINE (BENADRYL) injection 25 mg  25 mg IntraVENous Q4H PRN Elmer Ramp, MD       ??? naloxone [facility]) injection 0.4 mg  0.4 mg IntraVENous PRN Elmer Ramp, MD       ??? ondansetron [facility]) injection 4 mg  4 mg IntraVENous Q4H PRN Elmer Ramp, MD       ??? senna-docusate (PERICOLACE) 8.6-50 mg per tablet 2 Tab  2 Tab Oral DAILY PRN Elmer Ramp, MD       ??? sodium chloride (NS) flush 5-10 mL  5-10 mL IntraVENous Q8H Elmer Ramp, MD   10 mL at 04/18/17 0549   ??? sodium chloride (NS) flush 5-10 mL  5-10 mL IntraVENous PRN Elmer Ramp, MD       ??? zolpidem (AMBIEN) tablet 5 mg  5 mg Oral QHS PRN Elmer Ramp, MD       ??? heparin 25,000 units in dextrose 500 mL infusion  18-36 Units/kg/hr IntraVENous TITRATE Elmer Ramp, MD   Stopped at 04/18/17 0848   ??? dextrose (D50W) injection syrg 12.5-25 g  25-50 mL IntraVENous PRN Elmer Ramp, MD       ??? dextrose 40% (GLUTOSE) oral gel 1 Tube  15 g Oral PRN Elmer Ramp, MD       ??? glucagon (GLUCAGEN) injection 1 mg  1 mg IntraMUSCular PRN Elmer Ramp, MD       ??? insulin lispro (HUMALOG) injection  SubCUTAneous AC&HS Elmer Ramp, MD   Stopped at 04/17/17 2200   ??? bisacodyl (DULCOLAX) tablet 5 mg  5 mg Oral DAILY PRN Elmer Ramp, MD       ??? dabrafenib cap 150 mg  150 mg Oral BID Elmer Ramp, MD   Stopped at 04/18/17 0900    ??? dexamethasone (DECADRON) 4 mg/mL injection 4 mg  4 mg IntraVENous Q6H Elmer Ramp, MD   4 mg at 04/18/17 0549   ??? guaiFENesin-dextromethorphan (ROBITUSSIN DM) 100-10 mg/5 mL syrup 10 mL  10 mL Oral Q4H PRN Elmer Ramp, MD       ??? hydrALAZINE (APRESOLINE) 20 mg/mL injection 20 mg  20 mg IntraVENous Q6H PRN Elmer Ramp, MD       ??? HYDROcodone-acetaminophen [facility]) 5-325 mg per tablet 1 Tab  1 Tab Oral Q4H PRN Elmer Ramp, MD       ??? HYDROcodone-acetaminophen [facility]) 10-325 mg tablet 1 Tab  1 Tab Oral Q4H PRN Elmer Ramp, MD       ??? ibuprofen (MOTRIN) tablet 800 mg  800 mg Oral Q6H PRN Elmer Ramp, MD       ??? polyethylene glycol (MIRALAX) packet 17 g  17 g Oral DAILY PRN Elmer Ramp, MD       ??? simvastatin (ZOCOR) tablet 20 mg  20 mg Oral QHS Elmer Ramp, MD   Stopped at 04/17/17 2200   ??? trametinib tab 2 mg  2 mg Oral DAILY Elmer Ramp, MD   Stopped at 04/18/17 0900   ??? 0.9% sodium chloride infusion 250 mL  250 mL IntraVENous PRN Waverly Ferrari, MD       ??? levETIRAcetam (KEPPRA) 500 mg in 0.9% sodium chloride 100 mL IVPB  500 mg IntraVENous Q12H Waverly Ferrari, MD   500 mg at 04/18/17 0050   ??? pantoprazole (PROTONIX) 40 mg in sodium chloride 0.9% 10 mL injection  40 mg IntraVENous Q12H Emogene Morgan, MD   40 mg at 04/18/17 0851       OBJECTIVE:  Patient Vitals for the past 8 hrs:   BP Temp Pulse Resp SpO2   04/18/17 0947 (!) 185/110 - (!) 107 26 96 %   04/18/17 0931 (!) 176/100 - (!) 105 (!) 32 96 %   04/18/17 0918 (!) 170/100 - (!) 102 (!) 92 95 %   04/18/17 0902 (!) 154/119 - 97 26 95 %   04/18/17 0851 (!) 167/107 - (!) 112 - -   04/18/17 0846 (!) 167/106 - (!) 119 30 95 %   04/18/17 0831 (!) 163/98 - (!) 117 23 96 %   04/18/17 0817 (!) 170/91 - (!) 109 21 95 %   04/18/17 0802 (!) 172/105 - (!) 108 27 96 %   04/18/17 0747 (!) 166/96 - (!) 106 26 96 %   04/18/17 0731 (!) 159/105 - (!) 109 21 97 %    04/18/17 0716 (!) 159/105 - (!) 101 22 97 %   04/18/17 0701 128/88 98.5 ??F (36.9 ??C) 90 18 95 %   04/18/17 0632 (!) 140/95 - (!) 102 20 97 %   04/18/17 0617 (!) 137/95 - (!) 101 18 94 %   04/18/17 0601 (!) 132/96 - 95 25 95 %   04/18/17 0548 123/86 - 93 - 92 %   04/18/17 0531 121/90 - 90 22 92 %   04/18/17 0516 120/90 - 92 26 92 %  04/18/17 0501 120/84 - 96 23 92 %   04/18/17 0447 112/82 - 89 25 92 %   04/18/17 0432 120/87 - 94 25 91 %   04/18/17 0417 120/87 - 96 25 91 %   04/18/17 0401 111/73 - 96 30 93 %   04/18/17 0346 112/77 - 95 20 90 %   04/18/17 0331 118/67 - 97 20 91 %   04/18/17 0316 121/80 99.6 ??F (37.6 ??C) 99 15 93 %   04/18/17 0301 (!) 145/96 - (!) 105 20 93 %   04/18/17 0247 (!) 129/94 - (!) 110 29 96 %   04/18/17 0232 (!) 144/96 - (!) 115 - 94 %     Temp (24hrs), Avg:98.1 ??F (36.7 ??C), Min:97.2 ??F (36.2 ??C), Max:99.6 ??F (37.6 ??C)         Physical Exam:  Constitutional: Well developed, well nourished female in no acute distress, sitting comfortably in the hospital bed.    HEENT: Normocephalic and atraumatic. Oropharynx is clear, mucous membranes are moist.  Sclerae anicteric. Neck supple without JVD. No thyromegaly present.    Lymph node   No palpable submandibular, cervical, supraclavicular lymph nodes.   Skin Warm and dry.  Multiple ecchymoses with some petechial appearing lesions over upper arms.  No erythema.  No pallor.    Respiratory Lungs are clear to auscultation anteriorly without wheezes, rales or rhonchi, normal air exchange without accessory muscle use.    CVS Normal rate, regular rhythm and normal S1 and S2.  No murmurs, gallops, or rubs.   Abdomen Soft, nontender and nondistended, normoactive bowel sounds.  No palpable mass.  No hepatosplenomegaly.   Neuro Lethargic, post-ictal.     MSK Normal range of motion in general.  No edema and no tenderness.   Psych Appropriate mood and affect.      Labs:    Recent Results (from the past 24 hour(s))   GLUCOSE, POC     Collection Time: 04/17/17 12:24 PM   Result Value Ref Range    Glucose (POC) 336 (H) 65 - 045 mg/dL   METABOLIC PANEL, COMPREHENSIVE    Collection Time: 04/17/17 12:25 PM   Result Value Ref Range    Sodium 141 136 - 145 mmol/L    Potassium 3.6 3.5 - 5.1 mmol/L    Chloride 99 98 - 107 mmol/L    CO2 21 21 - 32 mmol/L    Anion gap 21 (H) 7 - 16 mmol/L    Glucose 384 (H) 65 - 100 mg/dL    BUN 20 8 - 23 MG/DL    Creatinine 1.51 (H) 0.6 - 1.0 MG/DL    GFR est AA 45 (L) >60 ml/min/1.28m    GFR est non-AA 37 (L) >60 ml/min/1.789m   Calcium 9.1 8.3 - 10.4 MG/DL    Bilirubin, total 0.4 0.2 - 1.1 MG/DL    ALT (SGPT) 42 12 - 65 U/L    AST (SGOT) 31 15 - 37 U/L    Alk. phosphatase 90 50 - 136 U/L    Protein, total 6.7 6.3 - 8.2 g/dL    Albumin 2.9 (L) 3.2 - 4.6 g/dL    Globulin 3.8 (H) 2.3 - 3.5 g/dL    A-G Ratio 0.8 (L) 1.2 - 3.5     CBC WITH AUTOMATED DIFF    Collection Time: 04/17/17 12:25 PM   Result Value Ref Range    WBC 13.6 (H) 4.3 - 11.1 K/uL    RBC 5.03 4.05 - 5.25  M/uL    HGB 14.6 11.7 - 15.4 g/dL    HCT 47.1 (H) 35.8 - 46.3 %    MCV 93.6 79.6 - 97.8 FL    MCH 29.0 26.1 - 32.9 PG    MCHC 31.0 (L) 31.4 - 35.0 g/dL    RDW 17.2 (H) 11.9 - 14.6 %    PLATELET 212 150 - 450 K/uL    MPV 9.4 (L) 10.8 - 14.1 FL    NEUTROPHILS 57 47 - 75 %    LYMPHOCYTES 40 16 - 44 %    MONOCYTES 3 3 - 9 %    NRBC 2.0 PER 100 WBC    ABS. NEUTROPHILS 7.7 1.7 - 8.2 K/UL    ABS. LYMPHOCYTES 5.5 (H) 0.5 - 4.6 K/UL    ABS. MONOCYTES 0.4 0.1 - 1.3 K/UL    RBC COMMENTS SLIGHT  ANISOCYTOSIS + POIKILOCYTOSIS        RBC COMMENTS OCCASIONAL  POLYCHROMASIA        WBC COMMENTS OCCASIONAL      PLATELET COMMENTS ADEQUATE      DF AUTOMATED     PROTHROMBIN TIME + INR    Collection Time: 04/17/17 12:25 PM   Result Value Ref Range    Prothrombin time 12.8 11.5 - 14.5 sec    INR 1.0     MAGNESIUM    Collection Time: 04/17/17 12:25 PM   Result Value Ref Range    Magnesium 2.5 (H) 1.8 - 2.4 mg/dL   TROPONIN I    Collection Time: 04/17/17 12:25 PM    Result Value Ref Range    Troponin-I, Qt. 1.26 (HH) 0.02 - 0.05 NG/ML   EKG, 12 LEAD, INITIAL    Collection Time: 04/17/17 12:27 PM   Result Value Ref Range    Ventricular Rate 165 BPM    Atrial Rate 174 BPM    QRS Duration 98 ms    Q-T Interval 302 ms    QTC Calculation (Bezet) 500 ms    Calculated R Axis 90 degrees    Calculated T Axis 43 degrees    Diagnosis       Undetermined rhythm  Rightward axis  Incomplete right bundle branch block  Marked ST abnormality, possible inferior subendocardial injury  Abnormal ECG  When compared with ECG of 28-Feb-2017 10:21,  Significant changes have occurred     BLOOD GAS, ARTERIAL    Collection Time: 04/17/17 12:30 PM   Result Value Ref Range    pH 7.15 (L) 7.35 - 7.45      PCO2 22 (L) 35 - 45 mmHg    PO2 147 (H) 80 - 105 mmHg    BICARBONATE 7 (L) 22 - 26 mmol/L    BASE DEFICIT 19.6 (H) 0 - 2 mmol/L    TOTAL HEMOGLOBIN 14.8 11.7 - 15.0 GM/DL    O2 SAT 98 92 - 98.5 %    Arterial O2 Hgb 97.5 (H) 94 - 97 %    CARBOXYHEMOGLOBIN 0.0 (L) 0.5 - 1.5 %    METHEMOGLOBIN 0.3 0.0 - 1.5 %    DEOXYHEMOGLOBIN 2 0.0 - 5.0 %    SITE LB     ALLENS TEST NA     MODE NRB     O2 FLOW 15.00 L/min    Respiratory comment: Dr. Casimer Leek at 4 28 2018 12 35 53 PM. Read back.    TROPONIN I    Collection Time: 04/17/17  4:25 PM   Result Value Ref Range  Troponin-I, Qt. 1.74 (HH) 0.02 - 0.05 NG/ML   CK    Collection Time: 04/17/17  4:25 PM   Result Value Ref Range    CK 83 21 - 215 U/L   CULTURE, BLOOD    Collection Time: 04/17/17  4:25 PM   Result Value Ref Range    Special Requests: NO SPECIAL REQUESTS  RIGHT  ARM        Culture result: NO GROWTH AFTER 14 HOURS     CULTURE, BLOOD    Collection Time: 04/17/17  4:25 PM   Result Value Ref Range    Special Requests: NO SPECIAL REQUESTS  RIGHT  ARM        Culture result: NO GROWTH AFTER 14 HOURS     LACTIC ACID    Collection Time: 04/17/17  4:25 PM   Result Value Ref Range    Lactic acid 9.8 (HH) 0.4 - 2.0 MMOL/L   PTT    Collection Time: 04/17/17  4:25 PM    Result Value Ref Range    aPTT 26.1 23.2 - 35.3 SEC   GLUCOSE, POC    Collection Time: 04/17/17  4:40 PM   Result Value Ref Range    Glucose (POC) 392 (H) 65 - 100 mg/dL   BLOOD GAS, ARTERIAL    Collection Time: 04/17/17  5:48 PM   Result Value Ref Range    pH 7.33 (L) 7.35 - 7.45      PCO2 26 (L) 35 - 45 mmHg    PO2 104 80 - 105 mmHg    BICARBONATE 13 (L) 22 - 26 mmol/L    BASE DEFICIT 10.9 (H) 0 - 2 mmol/L    SITE LR     ALLENS TEST POSITIVE      MODE HFNC     O2 FLOW 9.00 L/min    Respiratory comment: Dr. Quillian Quince at 4 28 2018 5 54 36 PM. Read back.    GLUCOSE, POC    Collection Time: 04/17/17  8:41 PM   Result Value Ref Range    Glucose (POC) 260 (H) 65 - 100 mg/dL   BLOOD GAS, ARTERIAL    Collection Time: 04/17/17  9:14 PM   Result Value Ref Range    pH 7.37 7.35 - 7.45      PCO2 36 35 - 45 mmHg    PO2 82 80 - 105 mmHg    BICARBONATE 21 (L) 22 - 26 mmol/L    BASE DEFICIT 3.9 (H) 0 - 2 mmol/L    TOTAL HEMOGLOBIN 14.8 11.7 - 15.0 GM/DL    O2 SAT 95 92 - 98.5 %    Arterial O2 Hgb 94.6 94 - 97 %    CARBOXYHEMOGLOBIN 0.3 (L) 0.5 - 1.5 %    METHEMOGLOBIN 0.5 0.0 - 1.5 %    DEOXYHEMOGLOBIN 5 0.0 - 5.0 %    SITE RR     ALLENS TEST POSITIVE      MODE NC     O2 FLOW 7.00 L/min    Respiratory comment: Ann, RN at 4 28 2018 9 24 36 PM. Read back.    LACTIC ACID    Collection Time: 04/17/17  9:27 PM   Result Value Ref Range    Lactic acid 3.4 (HH) 0.4 - 2.0 MMOL/L   HGB & HCT    Collection Time: 04/17/17  9:27 PM   Result Value Ref Range    HGB 11.8 11.7 - 15.4 g/dL    HCT 37.0  35.8 - 46.3 %   DRUG SCREEN, URINE    Collection Time: 04/17/17  9:34 PM   Result Value Ref Range    PCP(PHENCYCLIDINE) NEGATIVE       BENZODIAZEPINES NEGATIVE       COCAINE NEGATIVE       AMPHETAMINES NEGATIVE       METHADONE NEGATIVE       THC (TH-CANNABINOL) NEGATIVE       OPIATES NEGATIVE       BARBITURATES NEGATIVE      TYPE + CROSSMATCH    Collection Time: 04/17/17  9:55 PM   Result Value Ref Range    Crossmatch Expiration 04/20/2017      ABO/Rh(D) A POSITIVE     Antibody screen NEG     Unit number [id number]     Blood component type RC LR     Unit division 00     Status of unit ALLOCATED     Crossmatch result Compatible     Unit number [id number]     Blood component type RC LR     Unit division 00     Status of unit ALLOCATED     Crossmatch result Compatible     Unit number [id number]     Blood component type RC LR     Unit division 00     Status of unit ALLOCATED     Crossmatch result Compatible    PTT    Collection Time: 04/17/17 10:01 PM   Result Value Ref Range    aPTT >200.0 (HH) 23.2 - 35.3 SEC   TROPONIN I    Collection Time: 04/17/17 11:30 PM   Result Value Ref Range    Troponin-I, Qt. 1.24 (HH) 0.02 - 0.05 NG/ML   MAGNESIUM    Collection Time: 04/18/17  4:03 AM   Result Value Ref Range    Magnesium 2.0 1.8 - 2.4 mg/dL   METABOLIC PANEL, BASIC    Collection Time: 04/18/17  4:03 AM   Result Value Ref Range    Sodium 143 136 - 145 mmol/L    Potassium 2.5 (LL) 3.5 - 5.1 mmol/L    Chloride 107 98 - 107 mmol/L    CO2 29 21 - 32 mmol/L    Anion gap 7 7 - 16 mmol/L    Glucose 104 (H) 65 - 100 mg/dL    BUN 15 8 - 23 MG/DL    Creatinine 0.62 0.6 - 1.0 MG/DL    GFR est AA >60 >60 ml/min/1.47m    GFR est non-AA >60 >60 ml/min/1.728m   Calcium 7.0 (L) 8.3 - 10.4 MG/DL   CBC WITH AUTOMATED DIFF    Collection Time: 04/18/17  4:03 AM   Result Value Ref Range    WBC 10.1 4.3 - 11.1 K/uL    RBC 4.25 4.05 - 5.25 M/uL    HGB 12.8 11.7 - 15.4 g/dL    HCT 37.5 35.8 - 46.3 %    MCV 88.2 79.6 - 97.8 FL    MCH 30.1 26.1 - 32.9 PG    MCHC 34.1 31.4 - 35.0 g/dL    RDW 17.1 (H) 11.9 - 14.6 %    PLATELET 128 (L) 150 - 450 K/uL    MPV 8.7 (L) 10.8 - 14.1 FL    DF AUTOMATED      NEUTROPHILS 82 (H) 43 - 78 %    LYMPHOCYTES 12 (L) 13 - 44 %    MONOCYTES 5 4.0 - 12.0 %  EOSINOPHILS 0 (L) 0.5 - 7.8 %    BASOPHILS 0 0.0 - 2.0 %    IMMATURE GRANULOCYTES 1 0.0 - 5.0 %    ABS. NEUTROPHILS 8.2 1.7 - 8.2 K/UL    ABS. LYMPHOCYTES 1.2 0.5 - 4.6 K/UL     ABS. MONOCYTES 0.5 0.1 - 1.3 K/UL    ABS. EOSINOPHILS 0.0 0.0 - 0.8 K/UL    ABS. BASOPHILS 0.0 0.0 - 0.2 K/UL    ABS. IMM. GRANS. 0.1 0.0 - 0.5 K/UL   PHOSPHORUS    Collection Time: 04/18/17  4:03 AM   Result Value Ref Range    Phosphorus 2.9 2.3 - 3.7 MG/DL   LACTIC ACID    Collection Time: 04/18/17  4:03 AM   Result Value Ref Range    Lactic acid 1.2 0.4 - 2.0 MMOL/L   ALBUMIN    Collection Time: 04/18/17  4:03 AM   Result Value Ref Range    Albumin 2.2 (L) 3.2 - 4.6 g/dL   PTT    Collection Time: 04/18/17  6:54 AM   Result Value Ref Range    aPTT >200.0 (HH) 23.2 - 35.3 SEC   GLUCOSE, POC    Collection Time: 04/18/17  7:30 AM   Result Value Ref Range    Glucose (POC) 127 (H) 65 - 100 mg/dL       Imaging:  No images are attached to the encounter.    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    Pertinent old records were reviewed.     Case discussed with Dr. Rollene Rotunda.    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  She has had intermittent episodes, almost like a TIA in presentation, that have resolved after 36-48 hours.  None of these had really been associated with seizure-like activity until now, but this  should certainly be considered as a possible etiology - now on Keppra and Neurology consulted.  PE noted on CT, agree with UFH especially given GI bleed noted on admission, Hgb stable now and GI following.  Transition to oral anticoagulation once stable.  Monitor petechial rash, her platelets are adequate on CBC today.  HTN per hospitalists, on Levophed briefly but now running high, PRN hydralazine and metoprolol available.  Hold Taf/Mek until she passes swallow eval, then resume.      Thank you for allowing me to participate in the care of Katelyn [name].  We will follow along with you.          Greer Ee, [facility]  [address] [place] [address]  Office : [phone]  Fax : [phone]

## 2017-04-18 NOTE — Progress Notes (Signed)
Request by staff.  Patient was very peaceful and even opened her eyes as we visited together.  Loving family at bedside as they placed their hands on her for reassurance and spoke words of comfort.  Very open discussion with the family as they seek guidance from God moving forward:  God is at the center of all discussions and decisions - Jenny Reichmann has placed her life in His hands by faith.  Patient has made a covenant with her husband Richardson Landry that they trust each other in the decisions they make concerning each other.  One of the things the patient made clear was the directives that was completed in Nov 2017.  She stated that she completely trusted Richardson Landry to make decisions on her behalf.  Reminded Richardson Landry and the family of this great blessing but also the responsibility that comes with it.  We never want to lose our loved ones but we also do not want them to hurt unrealistically when heaven is before [place].  Reminded family that the best medicine at this time is their presence, their words and their touch.  Family was very realistic in understanding the gravity of her illness.  They also are very open to compassionate guidance as we care for this family.  Prayer.    Carolynn Comment, staff chaplain, [place], [place]  /   [email or web address]

## 2017-04-18 NOTE — Progress Notes (Signed)
Patient with active tremors on L side with L gaze deviation. Patient unable to understand and/or answer questions appropriately or follow commands at this time. Teleneurology MD as witness and will follow up with Dr. Andria Frames for recommendations.

## 2017-04-18 NOTE — Progress Notes (Signed)
Upon reassessment, patient is still responding to verbal stimulus and following simple commands. She is still using inappropriate words and is only oriented to person. She is able to move all four extremities. She has not had any additional bloody BMs at this time.

## 2017-04-18 NOTE — Progress Notes (Signed)
Dr. Andria Frames notified of family discussing comfort care with [facility]. OK to hold off on EEG until tomorrow to allow family time to make decision. EEG tech on call notified.

## 2017-04-19 LAB — CBC WITH AUTOMATED DIFF
ABS. BASOPHILS: 0 10*3/uL (ref 0.0–0.2)
ABS. EOSINOPHILS: 0 10*3/uL (ref 0.0–0.8)
ABS. IMM. GRANS.: 0 10*3/uL (ref 0.0–0.5)
ABS. LYMPHOCYTES: 0.9 10*3/uL (ref 0.5–4.6)
ABS. MONOCYTES: 0.2 10*3/uL (ref 0.1–1.3)
ABS. NEUTROPHILS: 5.6 10*3/uL (ref 1.7–8.2)
BASOPHILS: 0 % (ref 0.0–2.0)
EOSINOPHILS: 0 % — ABNORMAL LOW (ref 0.5–7.8)
HCT: 37.2 % (ref 35.8–46.3)
HGB: 12 g/dL (ref 11.7–15.4)
IMMATURE GRANULOCYTES: 1 % (ref 0.0–5.0)
LYMPHOCYTES: 13 % (ref 13–44)
MCH: 29.5 PG (ref 26.1–32.9)
MCHC: 32.3 g/dL (ref 31.4–35.0)
MCV: 91.4 FL (ref 79.6–97.8)
MONOCYTES: 3 % — ABNORMAL LOW (ref 4.0–12.0)
MPV: 8.6 FL — ABNORMAL LOW (ref 10.8–14.1)
NEUTROPHILS: 83 % — ABNORMAL HIGH (ref 43–78)
PLATELET: 107 10*3/uL — ABNORMAL LOW (ref 150–450)
RBC: 4.07 M/uL (ref 4.05–5.25)
RDW: 17.8 % — ABNORMAL HIGH (ref 11.9–14.6)
WBC: 6.6 10*3/uL (ref 4.3–11.1)

## 2017-04-19 LAB — HEPATIC FUNCTION PANEL
A-G Ratio: 0.7 — ABNORMAL LOW (ref 1.2–3.5)
ALT (SGPT): 66 U/L — ABNORMAL HIGH (ref 12–65)
AST (SGOT): 53 U/L — ABNORMAL HIGH (ref 15–37)
Albumin: 2.2 g/dL — ABNORMAL LOW (ref 3.2–4.6)
Alk. phosphatase: 77 U/L (ref 50–136)
Bilirubin, direct: 0.1 MG/DL (ref <0.4)
Bilirubin, total: 0.5 MG/DL (ref 0.2–1.1)
Globulin: 3.1 g/dL (ref 2.3–3.5)
Protein, total: 5.3 g/dL — ABNORMAL LOW (ref 6.3–8.2)

## 2017-04-19 LAB — METABOLIC PANEL, BASIC
Anion gap: 9 mmol/L (ref 7–16)
BUN: 15 MG/DL (ref 8–23)
CO2: 25 mmol/L (ref 21–32)
Calcium: 7.7 MG/DL — ABNORMAL LOW (ref 8.3–10.4)
Chloride: 110 mmol/L — ABNORMAL HIGH (ref 98–107)
Creatinine: 0.57 MG/DL — ABNORMAL LOW (ref 0.6–1.0)
GFR est AA: >60 mL/min/{1.73_m2} (ref >60)
GFR est non-AA: >60 mL/min/{1.73_m2} (ref >60)
Glucose: 136 mg/dL — ABNORMAL HIGH (ref 65–100)
Potassium: 4.1 mmol/L (ref 3.5–5.1)
Sodium: 144 mmol/L (ref 136–145)

## 2017-04-19 LAB — VANCOMYCIN, TROUGH: Vancomycin,trough: 11.1 ug/mL (ref 5–20)

## 2017-04-19 LAB — PTT
aPTT: 134 s — CR (ref 23.2–35.3)
aPTT: 73.9 s — ABNORMAL HIGH (ref 23.2–35.3)
aPTT: 39.7 s — ABNORMAL HIGH (ref 23.2–35.3)
aPTT: 56.9 s — ABNORMAL HIGH (ref 23.2–35.3)
aPTT: 64 s — ABNORMAL HIGH (ref 23.2–35.3)

## 2017-04-19 LAB — GLUCOSE, POC
Glucose (POC): 152 mg/dL — ABNORMAL HIGH (ref 65–100)
Glucose (POC): 128 mg/dL — ABNORMAL HIGH (ref 65–100)
Glucose (POC): 114 mg/dL — ABNORMAL HIGH (ref 65–100)
Glucose (POC): 107 mg/dL — ABNORMAL HIGH (ref 65–100)

## 2017-04-19 LAB — HIT PROFILE
HEPARIN INDUCED PLT AB.: NEGATIVE
HIT INTERPRETATION: NEGATIVE
OPTICAL DENSITY READ: 0.135 ABS (ref <0.400)

## 2017-04-19 LAB — PROTHROMBIN TIME + INR
INR: 1
Prothrombin time: 12.5 s (ref 11.5–14.5)

## 2017-04-19 LAB — POTASSIUM: Potassium: 4.1 mmol/L (ref 3.5–5.1)

## 2017-04-19 LAB — PLATELET COUNT: PLATELET: 112 10*3/uL — ABNORMAL LOW (ref 150–450)

## 2017-04-19 MED ORDER — VANCOMYCIN IN 0.9 % SODIUM CHLORIDE 1.25 GRAM/250 ML IV
1.25 gram/250 mL | Freq: Two times a day (BID) | INTRAVENOUS | Status: DC
Start: 2017-04-19 — End: 2017-04-20
  Administered 2017-04-20 (×2): via INTRAVENOUS

## 2017-04-19 MED ORDER — SODIUM CHLORIDE 0.9 % IV
100 mg/mL | INTRAVENOUS | Status: DC
Start: 2017-04-19 — End: 2017-04-20
  Administered 2017-04-19 – 2017-04-20 (×8): via INTRAVENOUS

## 2017-04-19 MED ORDER — PIPERACILLIN-TAZOBACTAM 4.5 GRAM IV SOLR
4.5 gram | Freq: Four times a day (QID) | INTRAVENOUS | Status: DC
Start: 2017-04-19 — End: 2017-04-20
  Administered 2017-04-19 – 2017-04-20 (×3): via INTRAVENOUS

## 2017-04-19 MED ORDER — LEVETIRACETAM 500 MG/5 ML IV SOLN
5005 mg/5 mL | Freq: Two times a day (BID) | INTRAVENOUS | Status: DC
Start: 2017-04-19 — End: 2017-04-20
  Administered 2017-04-19 – 2017-04-20 (×2): via INTRAVENOUS

## 2017-04-19 MED ORDER — SODIUM CHLORIDE 0.9 % IV
5005100 mg/5 mL (100 mg/mL) | Freq: Two times a day (BID) | INTRAVENOUS | Status: DC
Start: 2017-04-19 — End: 2017-04-20
  Administered 2017-04-19 – 2017-04-20 (×2): via INTRAVENOUS

## 2017-04-19 MED ORDER — LORAZEPAM 2 MG/ML IJ SOLN
2 mg/mL | Freq: Four times a day (QID) | INTRAMUSCULAR | Status: DC | PRN
Start: 2017-04-19 — End: 2017-04-20

## 2017-04-19 MED ORDER — LORAZEPAM 2 MG/ML IJ SOLN
2 mg/mL | INTRAMUSCULAR | Status: AC
Start: 2017-04-19 — End: 2017-04-19
  Administered 2017-04-19: 17:00:00 via INTRAVENOUS

## 2017-04-19 MED ORDER — LORAZEPAM 2 MG/ML IJ SOLN
2 mg/mL | INTRAMUSCULAR | Status: DC | PRN
Start: 2017-04-19 — End: 2017-04-20
  Administered 2017-04-19 – 2017-04-20 (×2): via INTRAVENOUS

## 2017-04-19 MED FILL — ARGATROBAN 100 MG/ML IV SOLN: 100 mg/mL | INTRAVENOUS | Qty: 0.5

## 2017-04-19 MED FILL — LEVETIRACETAM 500 MG/5 ML IV SOLN: 500 mg/5 mL | INTRAVENOUS | Qty: 5

## 2017-04-19 MED FILL — DEXAMETHASONE SODIUM PHOSPHATE 4 MG/ML IJ SOLN: 4 mg/mL | INTRAMUSCULAR | Qty: 1

## 2017-04-19 MED FILL — HYDRALAZINE 20 MG/ML IJ SOLN: 20 mg/mL | INTRAMUSCULAR | Qty: 1

## 2017-04-19 MED FILL — VANCOMYCIN IN 0.9 % SODIUM CHLORIDE 1.25 GRAM/250 ML IV: 1.25 gram/250 mL | INTRAVENOUS | Qty: 250

## 2017-04-19 MED FILL — LORAZEPAM 2 MG/ML IJ SOLN: 2 mg/mL | INTRAMUSCULAR | Qty: 1

## 2017-04-19 MED FILL — PIPERACILLIN-TAZOBACTAM 4.5 GRAM IV SOLR: 4.5 gram | INTRAVENOUS | Qty: 4.5

## 2017-04-19 MED FILL — HEPARIN (PORCINE) IN D5W 25,000 UNIT/500 ML IV: 25000 unit/500 mL (50 unit/mL) | INTRAVENOUS | Qty: 500

## 2017-04-19 MED FILL — VALPROATE SODIUM 100 MG/ML IV: 500 mg/5 mL (100 mg/mL) | INTRAVENOUS | Qty: 5

## 2017-04-19 MED FILL — METOPROLOL TARTRATE 5 MG/5 ML IV SOLN: 5 mg/ mL | INTRAVENOUS | Qty: 5

## 2017-04-19 MED FILL — VANCOMYCIN 1,000 MG IV SOLR: 1000 mg | INTRAVENOUS | Qty: 1000

## 2017-04-19 MED FILL — LEVETIRACETAM 500 MG/5 ML IV SOLN: 500 mg/5 mL | INTRAVENOUS | Qty: 7.5

## 2017-04-19 MED FILL — PANTOPRAZOLE 40 MG IV SOLR: 40 mg | INTRAVENOUS | Qty: 40

## 2017-04-19 NOTE — Procedures (Signed)
[place]  EEG    Name:[name], Katelyn ANAND  MR#: [id number]  DOB: [birth year]  ACCOUNT #: [id number]   DATE OF SERVICE: 04/19/2017    ORDERING PHYSICIAN:  Myrtie Neither, MD    REASON:  Evaluation for seizure disorder.      MEDICATIONS:  Decadron, Keppra, Depacon.    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    DESCRIPTION:  The patient is basically in a comatose like state, unresponsive.    The record is a 21 channel EEG using 10-20 International electrode placement with the patient appearing awake, drowsing, sometimes sleeping and decreased responsiveness at times.    The background activity is a mixture of theta activities bilateral and diffuse theta background of 4.5-5.5 Hz, 50 microvolts.  There is throughout the record, about every 1.2 seconds, a periodic generalized high amplitude spike and wave epileptogenic activity that continues throughout the recording and is fairly generalized.  These are either bilateral periodic epileptiform discharges or what would be consistent with a nonconvulsive seizure.  The EKG is sinus rhythm.  Hyperventilation is not performed.  Photic stimulation is performed with some alteration of the background.  However, generalized slowing continues.    INTERPRETATION:    This is a markedly abnormal EEG because of the following:  1.  Epileptogenic activity throughout the recording that is regular and persistent and could be associated with persistent nonconvulsive seizure.  She does have some head movement during this seen on the audiovisual.  At the same, consideration would be for bilateral periodic lateralized epileptiform.  These are more apparent over the right hemisphere than the left as if there is an acute abnormality of the left hemisphere.  Sometimes during the recording during sleep, there is sleep potentials and spindling over the left hemisphere compared to the  right.  2.  Interpretation for possible convulsive and nonconvulsive seizure is recommended and warranted, although the only movements that I could see were some very subtle movements of the head during the recording.  Other consideration for periodic lateralized epileptiform discharges over the right hemisphere needs to be considered and correlated with a structural abnormality there as well.      Carolynn Serve, MD       BMT / MN  D: 04/19/2017 15:31     T: 04/19/2017 21:22  JOB #: [id number]

## 2017-04-19 NOTE — Consults (Signed)
Palliative Care    Patient: Katelyn [name] MRN: [id number]  SSN: [id number]    Date of Birth: 02-07-1956  Age: 61 y.o.  Sex: female       Date of Request: 04/19/2017  Date of Consult:  04/19/2017  Reason for Consult:  goals of care  Requesting Physician: Dr. Andria Frames     Assessment/Plan:     Principal Diagnosis:    Altered Mental Status R41.82  Additional Diagnoses:   ?? Frailty  R54  ?? Seizures, Convulsions  R56.9  ?? Counseling, Encounter for Medical Advice  Z71.9  ?? Encounter for Palliative Care  Z51.5    Palliative Performance Scale (PPS):  PPS: 30    Medical Decision Making:   Reviewed and summarized chart from admission to present.  Discussed case with appropriate providers: ICU IDT; Amy Barrett, primary RN; Tiana Loft, NP; Dr. Faith Rogue  Reviewed laboratory and x-ray data: CT head, recent MRI brain, CBC, CMP    Visit completed with Dr. Faith Rogue and Tiana Loft, NP.  Husband, and four children are at the bedside.  Patient resting in bed; alert, but aphasic during our visit, nods intermittently, follows commands intermittently.  Husband states that patient's mental status and ability to communicate waxes and wanes hour to hour.  Long discussion regarding patient's condition.  It is unknown if patient's brain disease is progression or pseudo progression; this has been discussed previously and during this admission with Dr. Maceo Pro and Dr. Raul Del.  Family appreciative of medical information, however, they raise concern about patient's functional status and quality of life regardless of the cause of her decline.  We discussed care options, mainly ongoing workup vs comfort measures via hospice.   Counseled on palliative care vs hospice.  They state that they are leaning towards hospice, but will take some time to consider.  In the meantime, antiepileptics have been increased, EEG is pending.  Will continue to follow.    Will discuss findings with members of the interdisciplinary team.      Thank you for this referral.           .    Subjective:     History obtained from:  Family, Care Provider and Chart    Chief Complaint: Seizures, AMS   History of Present Illness:  Katelyn [name] is a 61 yo female with PMH of metastatic melanoma with liver, lung, and brain mets, seizures, HLD, HTN, osteopenia, and other history as listed below. She presented to [facility] ER on 4/28- husband provided history of AMS and seizure like history prior to arrival.  In ER, she had ongoing jerking that became generalized, and patient became progressively lethargic. CT of the head showed multifocal metastases with improved mass effect compared to MRI in March.  Patient also found to have LE DVT and extensive bilateral PEs.  She was admitted for further management.       Advance Directive: Yes       Code Status:  DNR            Health Care Power of Attorney: Yes - Copy of [place] on file.    Past Medical History:   Diagnosis Date   ??? Cancer ([place])     melanoma - back   ??? Hypercholesterolemia    ??? Hypertension     managed with meds   ??? Nausea & vomiting    ??? Osteopenia       Past Surgical History:   Procedure Laterality Date   ???  HX COLONOSCOPY     ??? HX GYN      c-section x 5   ??? HX ORTHOPAEDIC Left 04/30/2016    knee   ??? HX OTHER SURGICAL      melanoma removed from back     Family History   Problem Relation Age of Onset   ??? Hypertension Mother    ??? Elevated Lipids Mother    ??? Elevated Lipids Father    ??? Hypertension Father    ??? Arthritis-osteo Father      spine   ??? Heart Disease Father    ??? Breast Cancer Other    ??? Diabetes Sister       Social History   Substance Use Topics   ??? Smoking status: Never Smoker   ??? Smokeless tobacco: Never Used   ??? Alcohol use No     Prior to Admission medications    Medication Sig Start Date End Date Taking? Authorizing Provider   spironolactone (ALDACTONE) 25 mg tablet Take 1 Tab by mouth daily. 04/14/17  Yes Dianna Rossetti, MD   traMADol (ULTRAM) 50 mg tablet Take 1 Tab by mouth every six (6) hours as needed for Pain. Max  Daily Amount: 200 mg. Indications: Pain 02/18/17  Yes Greer Ee, MD   dabrafenib (TAFINLAR) 75 mg cap Take 2 Caps by mouth two (2) times a day. 04/16/17   Greer Ee, MD   trametinib (MEKINIST) 2 mg tab Take 1 Tab by mouth daily. 04/16/17   Greer Ee, MD   dexamethasone (DECADRON) 4 mg tablet Take 4 mg by mouth four (4) times daily. 01/09/17   Historical Provider   OTHER,NON-FORMULARY, Handicap Placard 03/31/17   Greer Ee, MD   ibuprofen (MOTRIN) 200 mg tablet Take 200 mg by mouth every six (6) hours as needed for Pain.    Historical Provider   losartan (COZAAR) 100 mg tablet Take 100 mg by mouth. 01/10/17   Historical Provider   acetaminophen (TYLENOL) 325 mg tablet Take 650 mg by mouth every six (6) hours as needed. 01/09/17   Historical Provider   levETIRAcetam (KEPPRA) 500 mg tablet Take 1 Tab by mouth two (2) times a day. 11/23/16   Deland Pretty, MD   LORazepam (ATIVAN) 1 mg tablet Take 1 Tab by mouth nightly as needed (insomnia). Max Daily Amount: 1 mg. 11/23/16   Barbee Shropshire Reams, MD   simvastatin (ZOCOR) 20 mg tablet Take 1 Tab by mouth nightly. 10/14/16   Dianna Rossetti, MD   amLODIPine (NORVASC) 10 mg tablet Take 1 Tab by mouth nightly. 10/14/16   Dianna Rossetti, MD   fluticasone (FLONASE) 50 mcg/actuation nasal spray 2 Sprays by Both Nostrils route nightly as needed.    Historical Provider   cetirizine (ZYRTEC) 10 mg tablet Take  by mouth daily as needed.    Historical Provider       Allergies   Allergen Reactions   ??? Adhesive Tape-Silicones Rash        Review of Systems:  Review of systems not obtained due to patient factors: AMS, aphasic     Objective:     Visit Vitals   ??? BP (!) 175/107   ??? Pulse 81   ??? Temp 98.7 ??F (37.1 ??C)   ??? Resp 23   ??? Wt 68.6 kg (151 lb 3.8 oz)   ??? SpO2 96%   ??? Breastfeeding No   ??? BMI 23.69 kg/m2  Physical Exam:    General:  Frail. No acute distress.   Eyes:  Conjunctivae/corneas clear.    Nose: Nares normal. Septum midline. O2 via NC. NGT present.   Neck:  Supple, symmetrical, trachea midline.   Lungs:   Clear to auscultation bilaterally, unlabored.   Heart:  Irregular rate and rhythm.   Abdomen:   Soft, non-tender, non-distended.   Extremities: Normal, atraumatic, no cyanosis or edema.   Skin: Skin color, texture, turgor normal. No rash.   Neurologic: Aphasic.  Spontaneously moves head, extremities.     Psych: Alert, unable to assess orientation.      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

## 2017-04-19 NOTE — Progress Notes (Signed)
Malachy Mood, RN, liaison with [facility] met with family. Now, Katelyn [name], liaison with [facility] here to meet with family at their request. CM following.

## 2017-04-19 NOTE — Progress Notes (Signed)
Called Dr. Laural Golden to notify her of PTT=73.9 and platelets=107. Per heparin gtt protocol, patient should get a bolus of 35 units/kg and rate should be increased by 2 units/kg/hr. Asked MD if bolus and rate increase were appropriate for patient due to decreased platelets. Ordered to increase the rate by 2 units/kg/hr and hold the bolus dose.

## 2017-04-19 NOTE — Progress Notes (Signed)
Called lab to check status of timed PTT. Still in process, will call back if no result in 10-15 minutes.

## 2017-04-19 NOTE — Progress Notes (Signed)
Spoke with pt's spouse. Assuring him that we want him to have a peace about decision, and not to feel rushed,  and to let CM know when he is ready. States that he would like to hear a presentation from both agencies. [facility] and [facility]. Call to [facility], RN, liaison and she can meet with them now. Spouse agreed now is good. Call to [facility], [facility] to update regarding spouse decision. Hopefully they can meet with family after [facility]. CM to follow regarding any assist with POC.

## 2017-04-19 NOTE — Consults (Signed)
Palliative Care    Patient: Katelyn [name] MRN: [id number]  SSN: [id number]    Date of Birth: 09/21/1956  Age: 61 y.o.  Sex: female       Date of Request: 04/19/2017  Date of Consult:  04/19/2017  Reason for Consult:  goals of care  Requesting Physician: Dr. Andria Frames     Assessment/Plan:     Principal Diagnosis:    Altered Mental Status R41.82  Additional Diagnoses:   ?? Frailty  R54  ?? Seizures, Convulsions  R56.9  ?? Counseling, Encounter for Medical Advice  Z71.9  ?? Encounter for Palliative Care  Z51.5    Palliative Performance Scale (PPS):  PPS: 30    Medical Decision Making:   Reviewed and summarized chart from admission to present.  Discussed case with appropriate providers: ICU IDT; Amy Barrett, primary RN; Tiana Loft, NP; Dr. Faith Rogue  Reviewed laboratory and x-ray data: CT head, recent MRI brain, CBC, CMP    Visit completed with Dr. Faith Rogue and Tiana Loft, NP.  Husband, and four children are at the bedside.  Patient resting in bed; alert, but aphasic during our visit, nods intermittently, follows commands intermittently.  Husband states that patient's mental status and ability to communicate waxes and wanes hour to hour.  Long discussion regarding patient's condition.  It is unknown if patient's brain disease is progression or pseudo progression; this has been discussed previously and during this admission with Dr. Maceo Pro and Dr. Raul Del.  Family appreciative of medical information, however, they raise concern about patient's functional status and quality of life regardless of the cause of her decline.  We discussed care options, mainly ongoing workup vs comfort measures via hospice.   Counseled on palliative care vs hospice.  They state that they are leaning towards hospice, but will take some time to consider.  In the meantime, antiepileptics have been increased, EEG is pending.  Will continue to follow.    Will discuss findings with members of the interdisciplinary team.      Thank you for this referral.          .     Subjective:     History obtained from:  Family, Care Provider and Chart    Chief Complaint: Seizures, AMS   History of Present Illness:  Katelyn [name] is a 61 yo female with PMH of metastatic melanoma with liver, lung, and brain mets, seizures, HLD, HTN, osteopenia, and other history as listed below. She presented to [facility] ER on 4/28- husband provided history of AMS and seizure like history prior to arrival.  In ER, she had ongoing jerking that became generalized, and patient became progressively lethargic. CT of the head showed multifocal metastases with improved mass effect compared to MRI in March.  Patient also found to have LE DVT and extensive bilateral PEs.  She was admitted for further management.       Advance Directive: Yes       Code Status:  DNR            Health Care Power of Attorney: Yes - Copy of [place] on file.    Past Medical History:   Diagnosis Date   ??? Cancer ([place])     melanoma - back   ??? Hypercholesterolemia    ??? Hypertension     managed with meds   ??? Nausea & vomiting    ??? Osteopenia       Past Surgical History:   Procedure Laterality Date   ???  HX COLONOSCOPY     ??? HX GYN      c-section x 5   ??? HX ORTHOPAEDIC Left 04/30/2016    knee   ??? HX OTHER SURGICAL      melanoma removed from back     Family History   Problem Relation Age of Onset   ??? Hypertension Mother    ??? Elevated Lipids Mother    ??? Elevated Lipids Father    ??? Hypertension Father    ??? Arthritis-osteo Father      spine   ??? Heart Disease Father    ??? Breast Cancer Other    ??? Diabetes Sister       Social History   Substance Use Topics   ??? Smoking status: Never Smoker   ??? Smokeless tobacco: Never Used   ??? Alcohol use No     Prior to Admission medications    Medication Sig Start Date End Date Taking? Authorizing Provider   spironolactone (ALDACTONE) 25 mg tablet Take 1 Tab by mouth daily. 04/14/17  Yes Dianna Rossetti, MD   traMADol (ULTRAM) 50 mg tablet Take 1 Tab by mouth every six (6) hours as  needed for Pain. Max Daily Amount: 200 mg. Indications: Pain 02/18/17  Yes Greer Ee, MD   dabrafenib (TAFINLAR) 75 mg cap Take 2 Caps by mouth two (2) times a day. 04/16/17   Greer Ee, MD   trametinib (MEKINIST) 2 mg tab Take 1 Tab by mouth daily. 04/16/17   Greer Ee, MD   dexamethasone (DECADRON) 4 mg tablet Take 4 mg by mouth four (4) times daily. 01/09/17   Historical Provider   OTHER,NON-FORMULARY, Handicap Placard 03/31/17   Greer Ee, MD   ibuprofen (MOTRIN) 200 mg tablet Take 200 mg by mouth every six (6) hours as needed for Pain.    Historical Provider   losartan (COZAAR) 100 mg tablet Take 100 mg by mouth. 01/10/17   Historical Provider   acetaminophen (TYLENOL) 325 mg tablet Take 650 mg by mouth every six (6) hours as needed. 01/09/17   Historical Provider   levETIRAcetam (KEPPRA) 500 mg tablet Take 1 Tab by mouth two (2) times a day. 11/23/16   Deland Pretty, MD   LORazepam (ATIVAN) 1 mg tablet Take 1 Tab by mouth nightly as needed (insomnia). Max Daily Amount: 1 mg. 11/23/16   Barbee Shropshire Reams, MD   simvastatin (ZOCOR) 20 mg tablet Take 1 Tab by mouth nightly. 10/14/16   Dianna Rossetti, MD   amLODIPine (NORVASC) 10 mg tablet Take 1 Tab by mouth nightly. 10/14/16   Dianna Rossetti, MD   fluticasone (FLONASE) 50 mcg/actuation nasal spray 2 Sprays by Both Nostrils route nightly as needed.    Historical Provider   cetirizine (ZYRTEC) 10 mg tablet Take  by mouth daily as needed.    Historical Provider       Allergies   Allergen Reactions   ??? Adhesive Tape-Silicones Rash        Review of Systems:  Review of systems not obtained due to patient factors: AMS, aphasic     Objective:     Visit Vitals   ??? BP (!) 175/107   ??? Pulse 81   ??? Temp 98.7 ??F (37.1 ??C)   ??? Resp 23   ??? Wt 68.6 kg (151 lb 3.8 oz)   ??? SpO2 96%   ??? Breastfeeding No   ??? BMI 23.69 kg/m2  Physical Exam:    General:  Frail. No acute distress.   Eyes:  Conjunctivae/corneas clear.     Nose: Nares normal. Septum midline. O2 via NC. NGT present.   Neck: Supple, symmetrical, trachea midline.   Lungs:   Clear to auscultation bilaterally, unlabored.   Heart:  Irregular rate and rhythm.   Abdomen:   Soft, non-tender, non-distended.   Extremities: Normal, atraumatic, no cyanosis or edema.   Skin: Skin color, texture, turgor normal. No rash.   Neurologic: Aphasic.  Spontaneously moves head, extremities.     Psych: Alert, unable to assess orientation.      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

## 2017-04-19 NOTE — Procedures (Signed)
[place]  EEG    Name:[name], Katelyn FESLER  MR#: [id number]  DOB: [birth year]  ACCOUNT #: [id number]   DATE OF SERVICE: 04/19/2017    ORDERING PHYSICIAN:  Myrtie Neither, MD    REASON:  Evaluation for seizure disorder.      MEDICATIONS:  Decadron, Keppra, Depacon.    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    DESCRIPTION:  The patient is basically in a comatose like state, unresponsive.    The record is a 21 channel EEG using 10-20 International electrode placement with the patient appearing awake, drowsing, sometimes sleeping and decreased responsiveness at times.    The background activity is a mixture of theta activities bilateral and diffuse theta background of 4.5-5.5 Hz, 50 microvolts.  There is throughout the record, about every 1.2 seconds, a periodic generalized high amplitude spike and wave epileptogenic activity that continues throughout the recording and is fairly generalized.  These are either bilateral periodic epileptiform discharges or what would be consistent with a nonconvulsive seizure.  The EKG is sinus rhythm.  Hyperventilation is not performed.  Photic stimulation is performed with some alteration of the background.  However, generalized slowing continues.    INTERPRETATION:    This is a markedly abnormal EEG because of the following:  1.  Epileptogenic activity throughout the recording that is regular and persistent and could be associated with persistent nonconvulsive seizure.  She does have some head movement during this seen on the audiovisual.  At the same, consideration would be for bilateral periodic lateralized epileptiform.  These are more apparent over the right hemisphere than the left as if there is an acute abnormality of the left hemisphere.  Sometimes during the recording during sleep, there is sleep potentials and  spindling over the left hemisphere compared to the right.  2.  Interpretation for possible convulsive and nonconvulsive seizure is recommended and warranted, although the only movements that I could see were some very subtle movements of the head during the recording.  Other consideration for periodic lateralized epileptiform discharges over the right hemisphere needs to be considered and correlated with a structural abnormality there as well.      Carolynn Serve, MD       BMT / MN  D: 04/19/2017 15:31     T: 04/19/2017 21:22  JOB #: [id number]

## 2017-04-19 NOTE — Progress Notes (Signed)
I have faxed prescriptions for Mekinist and Tafinlar to [facility] and received confirmation of receipt.  Scanned into media.

## 2017-04-19 NOTE — Other (Signed)
Interdisciplinary team rounds were held 04/19/2017 with the following team members:Nursing, Nurse Practitioner, Palliative Care, Pastoral Care, Pharmacy, Physician, Respiratory Therapy and Clinical Coordinator and the patient.    Plan of care discussed. See clinical pathway and/or care plan for interventions and desired outcomes.

## 2017-04-19 NOTE — Progress Notes (Addendum)
[facility]        Inpatient Hematology / Oncology Progress Note      Admission Date: 04/17/2017  8:30 PM  Reason for Admission/Hospital Course: Extensive Bilateral Pulmonary Emboli  Melanoma  Metastatic Cancer  Seizures  Acute pulmonary embolism (HCC)      24 Hour Events:  No fevers  Eyes open but not responding  Tremors ongoing  Multiple family members present  DNR    ROS:   Unable to assess, pt not able to communicate     Allergies   Allergen Reactions   ??? Adhesive Tape-Silicones Rash       OBJECTIVE:  Patient Vitals for the past 8 hrs:   BP Temp Pulse Resp SpO2   04/19/17 1133 (!) 175/107 98.7 ??F (37.1 ??C) 81 23 96 %   04/19/17 0716 142/86 - (!) 59 (!) 33 98 %   04/19/17 0715 142/86 98.1 ??F (36.7 ??C) 65 29 98 %   04/19/17 0701 (!) 175/103 - 75 29 97 %   04/19/17 0631 (!) 159/93 - 61 24 97 %   04/19/17 0531 (!) 155/95 - 70 22 98 %     Temp (24hrs), Avg:98.7 ??F (37.1 ??C), Min:98.1 ??F (36.7 ??C), Max:99.7 ??F (37.6 ??C)    04/30 0701 - 04/30 1900  In: -   Out: 59 [Urine:925]    Physical Exam:  Constitutional: Ill appearing female in no acute distress, lying in hospital bed   HEENT: Normocephalic and atraumatic. Oropharynx is clear, mucous membranes are moist.    Lymph node   Deferred   Skin Warm and dry.  No bruising and no rash noted.  No erythema.  No pallor.    Respiratory Lungs are clear     CVS Normal rate, regular rhythm    Abdomen Soft, nontender and nondistended, normoactive bowel sounds.      Neuro Tremors to left leg, hands with involuntary movements; nonverbal, eyes open    MSK   No edema and no tenderness.       Labs:    Recent Labs      04/19/17   0932  04/19/17   0455  04/18/17   0403  04/17/17   2127  04/17/17   1225   WBC   --   6.6  10.1   --   13.6*   RBC   --   4.07  4.25   --   5.03   HGB   --   12.0  12.8  11.8  14.6   HCT   --   37.2  37.5  37.0  47.1*   MCV   --   91.4  88.2   --   93.6   MCH   --   29.5  30.1   --   29.0   MCHC   --   32.3  34.1   --   31.0*    RDW   --   17.8*  17.1*   --   17.2*   PLT  112*  107*  128*   --   212   GRANS   --   83*  82*   --   57   LYMPH   --   13  12*   --   40   MONOS   --   3*  5   --   3   EOS   --   0*  0*   --    --  BASOS   --   0  0   --    --    IG   --   1  1   --    --    DF   --   AUTOMATED  AUTOMATED   --   AUTOMATED   ANEU   --   5.6  8.2   --   7.7   ABL   --   0.9  1.2   --   5.5*   ABM   --   0.2  0.5   --   0.4   ABE   --   0.0  0.0   --    --    ABB   --   0.0  0.0   --    --    AIG   --   0.0  0.1   --    --       Recent Labs      04/19/17   0932  04/19/17   0455  04/18/17   2148  04/18/17   0403  04/17/17   1225   NA   --   144   --   143  141   K   --   4.1  4.1  2.5*  3.6   CL   --   110*   --   107  99   CO2   --   25   --   29  21   AGAP   --   9   --   7  21*   GLU   --   136*   --   104*  384*   BUN   --   15   --   15  20   CREA   --   0.57*   --   0.62  1.51*   GFRAA   --   >60   --   >60  45*   GFRNA   --   >60   --   >60  37*   CA   --   7.7*   --   7.0*  9.1   SGOT  53*   --    --    --   31   AP  77   --    --    --   90   TP  5.3*   --    --    --   6.7   ALB  2.2*   --    --   2.2*  2.9*   GLOB  3.1   --    --    --   3.8*   AGRAT  0.7*   --    --    --   0.8*   MG   --    --    --   2.0  2.5*   PHOS   --    --    --   2.9   --      Imaging:  DUPLEX LOWER EXT VENOUS BILAT [[id number]] Collected: 04/17/17 2348    ?? Order Status: Completed Updated: 04/17/17 2350   ?? Narrative: ??   ?? BILATERAL LOWER EXTREMITY VENOUS DUPLEX ULTRASOUND.    HISTORY: ??Pain.    TECHNIQUE: Direct skin-contact high resolution grayscale images, color-flow  Doppler and duplex waveform analysis.    FINDINGS: There  is compressibility, color-flow assignment and augmentation of  the venous waveform with calf compression in the right common femoral,  superficial femoral and popliteal veins. There is evidence of thrombus in the  left calf veins. The left deep venous system is otherwise normal.     ?? Impression: ??    ?? IMPRESSION: Acute left calf vein deep vein thrombosis.     ?? XR ABD (KUB) [[id number]] Collected: 04/17/17 2119   ?? Order Status: Completed Updated: 04/17/17 2121   ?? Narrative: ??   ?? Abdomen x-ray single view.    HISTORY: Dobbhoff tube placement.    COMPARISON: None.    FINDINGS: Dobbhoff tube its tip in the gastric body. Bowel gas pattern is  nonspecific.     ?? Impression: ??   ?? IMPRESSION:    Dobbhoff tube is tip in the gastric body.         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) ICD-10-CM: C78.00  ICD-9-CM: 197.0, 172.9  04/17/2017        AKI (acute kidney injury) ([place]) ICD-10-CM: N17.9  ICD-9-CM: 584.9  04/17/2017        Demand ischemia ([place]) ICD-10-CM: I24.8  ICD-9-CM: 411.89  04/17/2017        Brain edema (HCC) (Chronic) ICD-10-CM: G93.6  ICD-9-CM: 348.5  04/17/2017        Acute respiratory failure ([place]) ICD-10-CM: J96.00  ICD-9-CM: 518.81  04/17/2017        SIRS (systemic inflammatory response syndrome) ([place]) ICD-10-CM: R65.10  ICD-9-CM: 995.90  04/17/2017        * (Principal)Acute pulmonary embolism ([place]) ICD-10-CM: I26.99  ICD-9-CM: 415.19  04/17/2017        Shock ([place]) ICD-10-CM: R57.9  ICD-9-CM: 785.50  04/17/2017        Lactic acidosis ICD-10-CM: E87.2  ICD-9-CM: 276.2  04/17/2017        Aphasia (Chronic) ICD-10-CM: R47.01  ICD-9-CM: 784.3  02/28/2017        Memory difficulties ICD-10-CM: R41.3  ICD-9-CM: 780.93  11/20/2016        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[place]) ICD-10-CM: I61.9  ICD-9-CM: 784  11/19/2016         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  She has had intermittent episodes, almost like a TIA in presentation, that have resolved after 36-48 hours.  None of these had really been associated with seizure-like activity until now, but this should certainly be considered as a possible etiology - now on Keppra and Neurology consulted.  PE noted on CT, agree with UFH especially given GI bleed noted on admission, Hgb stable now and GI following.  Transition to oral anticoagulation once stable.  Monitor petechial rash, her platelets are adequate on CBC today.  HTN per hospitalists, on Levophed briefly but now running high, PRN hydralazine and metoprolol available.  Hold Taf/Mek until she passes swallow eval, then resume.     4/30 Multiple family members (4 children, son in law and husband) at bedside. They have been discussing the notion of comfort care and hospice with the hospitalist and ICU teams. Reviewed scans and Dr. Faith Rogue discussed case with Drs Maceo Pro and Jenetta Loges. 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 CT head seems to show improvement in mass effect on admission. ??Tele-neurology was consulted and recommended increasing  keppra and adding depakote BID for seizures. They have also suggested and MRI and follow up with local neurologist. Family has stated they have watched her decline over the last 6 months slowly and we discussed the notion of prolonging suffering or giving her meaningful life.  Family was given options to pursue hospice from today vs increasing seizure meds and reimaging the brain/ adjusting management strategies over the next couple of days then addressing goals of care. She is a DNR now which is appropriate. Palliative care was present for conversation. Will ask nursing to give a dose of ativan and follow up with family    ADDENDUM: 39: Spoke with patient's husband. They have elected for hosp[facility] support - [facility]. Anxiety/tremors improved with ativan. Continue PRN              Flavia Shipper, NP   [facility]  [address] [place] [address]  Office : [phone]  Fax : [phone]       Attending Addendum:  I personally evaluated the patient with Marcelyn Ditty, N.P.,  and agree with the assessment, findings and plan as documented.  Appears stable, heart regular without murmur, lungs clear, abdomen benign.  Case discussed w various specialists including rad-onc, as well as primary oncologist. Detailed conversation w family members. Agreement to titrate up seizure medications. They are deciding on weather to pursue hospice currently on see how patient does over next few days w better control of her seizures, and then make a decision. Atleast on imaging her underlying melanoma appears better.               Letha Cape, MD  [facility]  [facility]  [address] [place] [address]  Office : [phone]  Fax : [phone]

## 2017-04-19 NOTE — Progress Notes (Signed)
GI DAILY PROGRESS NOTE    Admit Date:  04/17/2017    Today's Date:  04/19/2017    CC:  Rectal bleed     Subjective:     Patient without rectal bleed ON.     Medications:   Current Facility-Administered Medications   Medication Dose Route Frequency   ??? argatroban 50 mg in 0.9% sodium chloride 50 mL (1000 mcg/mL) infusion  2-10 mcg/kg/min IntraVENous TITRATE   ??? NUTRITIONAL SUPPORT ELECTROLYTE PRN ORDERS   Does Not Apply PRN   ??? 0.45% sodium chloride infusion  50 mL/hr IntraVENous CONTINUOUS   ??? potassium chloride (KAON 10%) 20 mEq/15 mL oral liquid 40 mEq  40 mEq Oral BID WITH MEALS   ??? metoprolol (LOPRESSOR) injection 2.5 mg  2.5 mg IntraVENous Q6H PRN   ??? vancomycin (VANCOCIN) 1,000 mg in 0.9% sodium chloride (MBP/ADV) 250 mL  1 g IntraVENous Q12H   ??? VANC TROUGH REMINDER   Other ONCE   ??? LORazepam (ATIVAN) injection 2 mg  2 mg IntraVENous Q2H PRN   ??? acetaminophen (TYLENOL) suppository 650 mg  650 mg Rectal Q6H PRN   ??? NOREPINephrine (LEVOPHED) 4 mg in dextrose 5% 250 mL infusion  0.5-20 mcg/min IntraVENous TITRATE   ??? piperacillin-tazobactam (ZOSYN) 4.5 g in 0.9% sodium chloride (MBP/ADV) 100 mL  4.5 g IntraVENous Q8H   ??? acetaminophen (TYLENOL) tablet 650 mg  650 mg Oral Q4H PRN   ??? diphenhydrAMINE (BENADRYL) injection 25 mg  25 mg IntraVENous Q4H PRN   ??? naloxone (NARCAN) injection 0.4 mg  0.4 mg IntraVENous PRN   ??? ondansetron (ZOFRAN) injection 4 mg  4 mg IntraVENous Q4H PRN   ??? senna-docusate (PERICOLACE) 8.6-50 mg per tablet 2 Tab  2 Tab Oral DAILY PRN   ??? sodium chloride (NS) flush 5-10 mL  5-10 mL IntraVENous Q8H   ??? sodium chloride (NS) flush 5-10 mL  5-10 mL IntraVENous PRN   ??? zolpidem (AMBIEN) tablet 5 mg  5 mg Oral QHS PRN   ??? dextrose (D50W) injection syrg 12.5-25 g  25-50 mL IntraVENous PRN   ??? dextrose 40% (GLUTOSE) oral gel 1 Tube  15 g Oral PRN   ??? glucagon (GLUCAGEN) injection 1 mg  1 mg IntraMUSCular PRN   ??? insulin lispro (HUMALOG) injection   SubCUTAneous AC&HS    ??? bisacodyl (DULCOLAX) tablet 5 mg  5 mg Oral DAILY PRN   ??? dabrafenib cap 150 mg  150 mg Oral BID   ??? dexamethasone (DECADRON) 4 mg/mL injection 4 mg  4 mg IntraVENous Q6H   ??? guaiFENesin-dextromethorphan (ROBITUSSIN DM) 100-10 mg/5 mL syrup 10 mL  10 mL Oral Q4H PRN   ??? hydrALAZINE (APRESOLINE) 20 mg/mL injection 20 mg  20 mg IntraVENous Q6H PRN   ??? HYDROcodone-acetaminophen (NORCO) 5-325 mg per tablet 1 Tab  1 Tab Oral Q4H PRN   ??? HYDROcodone-acetaminophen (NORCO) 10-325 mg tablet 1 Tab  1 Tab Oral Q4H PRN   ??? ibuprofen (MOTRIN) tablet 800 mg  800 mg Oral Q6H PRN   ??? polyethylene glycol (MIRALAX) packet 17 g  17 g Oral DAILY PRN   ??? simvastatin (ZOCOR) tablet 20 mg  20 mg Oral QHS   ??? trametinib tab 2 mg  2 mg Oral DAILY   ??? 0.9% sodium chloride infusion 250 mL  250 mL IntraVENous PRN   ??? levETIRAcetam (KEPPRA) 500 mg in 0.9% sodium chloride 100 mL IVPB  500 mg IntraVENous Q12H   ??? pantoprazole (PROTONIX) 40 mg  in sodium chloride 0.9% 10 mL injection  40 mg IntraVENous Q12H       Review of Systems:  ROS was obtained, with pertinent positives as listed above.  No chest pain or SOB.    Diet:      Objective:   Vitals:  Visit Vitals   ??? BP 142/86 (BP 1 Location: Right arm, BP Patient Position: At rest)   ??? Pulse 65   ??? Temp 98.1 ??F (36.7 ??C)   ??? Resp 29   ??? Wt 68.6 kg (151 lb 3.8 oz)   ??? SpO2 98%   ??? Breastfeeding No   ??? BMI 23.69 kg/m2     Intake/Output:     04/28 1901 - 04/30 0700  In: 4688.6 [I.V.:4638.6]  Out: 2450 [Urine:2450]  Exam:  General appearance: ill appearing  HEENT: NG tube  Neuro: follows with eyes    Data Review (Labs):    Recent Labs      04/19/17   0455  04/18/17   2148  04/18/17   1451  04/18/17   0654  04/18/17   0403  04/17/17   2201  04/17/17   2127  04/17/17   1625  04/17/17   1225   WBC  6.6   --    --    --   10.1   --    --    --   13.6*   HGB  12.0   --    --    --   12.8   --   11.8   --   14.6   HCT  37.2   --    --    --   37.5   --   37.0   --   47.1*    PLT  107*   --    --    --   128*   --    --    --   212   MCV  91.4   --    --    --   88.2   --    --    --   93.6   NA  144   --    --    --   143   --    --    --   141   K  4.1  4.1   --    --   2.5*   --    --    --   3.6   CL  110*   --    --    --   107   --    --    --   99   CO2  25   --    --    --   29   --    --    --   21   BUN  15   --    --    --   15   --    --    --   20   CREA  0.57*   --    --    --   0.62   --    --    --   1.51*   CA  7.7*   --    --    --   7.0*   --    --    --  9.1   MG   --    --    --    --   2.0   --    --    --   2.5*   GLU  136*   --    --    --   104*   --    --    --   384*   AP   --    --    --    --    --    --    --    --   90   SGOT   --    --    --    --    --    --    --    --   31   ALB   --    --    --    --   2.2*   --    --    --   2.9*   TP   --    --    --    --    --    --    --    --   6.7   PTP   --    --    --    --    --    --    --    --   12.8   INR   --    --    --    --    --    --    --    --   1.0   APTT  73.9*  134.0*  172.4*  >200.0*   --   >200.0*   --   26.1   --        Assessment:     Principal Problem:    Acute pulmonary embolism (HCC) (04/17/2017)    Active Problems:    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 Thank you for allowing [place] to participate in the care of this patient. If we can be of any further assistance, please do not hesitate to contact us.    Susa Raring, MD  [facility] [place]

## 2017-04-19 NOTE — Progress Notes (Signed)
Call placed to [facility] at family request and spoke with Peter Congo. She will let Betsy know. [phone].

## 2017-04-19 NOTE — Progress Notes (Signed)
Pt's husband voices that he wants [facility].  Baker Janus, case mgmt notified.  Dr. Andria Frames notified.  Orders obtained.

## 2017-04-19 NOTE — Progress Notes (Signed)
Bedside shift change report given to Crystal, RN and Amy, RN (oncoming nurse) by Margreta Journey, RN (offgoing nurse). Report included the following information SBAR, Kardex, ED Summary, Procedure Summary, Intake/Output, MAR, Recent Results and Cardiac Rhythm NSR/SB.    Heparin gtt signed off.

## 2017-04-19 NOTE — Progress Notes (Signed)
Called to room to discuss hospice with pt's spouse and daughter. Daughter apparently has been discussing pt with [facility] via phone. Spouse is not sure if he wants [facility] and asking difference in [facility] and Palliative care. He is wanting smooth transition. Explained that it did not matter which agency, we could make smooth transition. Discussed wanting until oncology rounds and discuss with MD POC. Pt currently getting tx for PE, but no chemotherapy per nurse. CM to follow and assist with d/c POC.     Call placed back to [facility], with [facility] and ask her to hold referral at this time. Verbalized understanding.

## 2017-04-19 NOTE — Progress Notes (Signed)
Critical Care Daily Progress Note: 04/19/2017    Katelyn [name]   Admission Date: 04/17/2017         The patient's chart is reviewed and the patient is discussed with the staff.    61 yo WF with hx of stage 4 melanoma (Brain, lung, liver) admitted with sz and pulmonary embolism. Some GI bleeding on heparin.       Subjective:     Platelet count down to ~50% of baseline on admission- heparinoids stopped and patient switched to argotraban.    Current Facility-Administered Medications   Medication Dose Route Frequency   ??? argatroban 50 mg in 0.9% sodium chloride 50 mL (1000 mcg/mL) infusion  2-10 mcg/kg/min IntraVENous TITRATE   ??? NUTRITIONAL SUPPORT ELECTROLYTE PRN ORDERS   Does Not Apply PRN   ??? 0.45% sodium chloride infusion  50 mL/hr IntraVENous CONTINUOUS   ??? potassium chloride (KAON 10%) 20 mEq/15 mL oral liquid 40 mEq  40 mEq Oral BID WITH MEALS   ??? metoprolol (LOPRESSOR) injection 2.5 mg  2.5 mg IntraVENous Q6H PRN   ??? vancomycin (VANCOCIN) 1,000 mg in 0.9% sodium chloride (MBP/ADV) 250 mL  1 g IntraVENous Q12H   ??? VANC TROUGH REMINDER   Other ONCE   ??? LORazepam (ATIVAN) injection 2 mg  2 mg IntraVENous Q2H PRN   ??? acetaminophen (TYLENOL) suppository 650 mg  650 mg Rectal Q6H PRN   ??? NOREPINephrine (LEVOPHED) 4 mg in dextrose 5% 250 mL infusion  0.5-20 mcg/min IntraVENous TITRATE   ??? piperacillin-tazobactam (ZOSYN) 4.5 g in 0.9% sodium chloride (MBP/ADV) 100 mL  4.5 g IntraVENous Q8H   ??? acetaminophen (TYLENOL) tablet 650 mg  650 mg Oral Q4H PRN   ??? diphenhydrAMINE (BENADRYL) injection 25 mg  25 mg IntraVENous Q4H PRN   ??? naloxone (NARCAN) injection 0.4 mg  0.4 mg IntraVENous PRN   ??? ondansetron (ZOFRAN) injection 4 mg  4 mg IntraVENous Q4H PRN   ??? senna-docusate (PERICOLACE) 8.6-50 mg per tablet 2 Tab  2 Tab Oral DAILY PRN   ??? sodium chloride (NS) flush 5-10 mL  5-10 mL IntraVENous Q8H   ??? sodium chloride (NS) flush 5-10 mL  5-10 mL IntraVENous PRN    ??? zolpidem (AMBIEN) tablet 5 mg  5 mg Oral QHS PRN   ??? dextrose (D50W) injection syrg 12.5-25 g  25-50 mL IntraVENous PRN   ??? dextrose 40% (GLUTOSE) oral gel 1 Tube  15 g Oral PRN   ??? glucagon (GLUCAGEN) injection 1 mg  1 mg IntraMUSCular PRN   ??? insulin lispro (HUMALOG) injection   SubCUTAneous AC&HS   ??? bisacodyl (DULCOLAX) tablet 5 mg  5 mg Oral DAILY PRN   ??? dabrafenib cap 150 mg  150 mg Oral BID   ??? dexamethasone (DECADRON) 4 mg/mL injection 4 mg  4 mg IntraVENous Q6H   ??? guaiFENesin-dextromethorphan (ROBITUSSIN DM) 100-10 mg/5 mL syrup 10 mL  10 mL Oral Q4H PRN   ??? hydrALAZINE (APRESOLINE) 20 mg/mL injection 20 mg  20 mg IntraVENous Q6H PRN   ??? HYDROcodone-acetaminophen (NORCO) 5-325 mg per tablet 1 Tab  1 Tab Oral Q4H PRN   ??? HYDROcodone-acetaminophen (NORCO) 10-325 mg tablet 1 Tab  1 Tab Oral Q4H PRN   ??? ibuprofen (MOTRIN) tablet 800 mg  800 mg Oral Q6H PRN   ??? polyethylene glycol (MIRALAX) packet 17 g  17 g Oral DAILY PRN   ??? simvastatin (ZOCOR) tablet 20 mg  20 mg Oral QHS   ??? trametinib  tab 2 mg  2 mg Oral DAILY   ??? 0.9% sodium chloride infusion 250 mL  250 mL IntraVENous PRN   ??? levETIRAcetam (KEPPRA) 500 mg in 0.9% sodium chloride 100 mL IVPB  500 mg IntraVENous Q12H   ??? pantoprazole (PROTONIX) 40 mg in sodium chloride 0.9% 10 mL injection  40 mg IntraVENous Q12H       Review of Systems    Constitutional:  negative for fever, chills, sweats  Cardiovascular:  negative for chest pain, palpitations, syncope, edema  Gastrointestinal:  negative for dysphagia, reflux, vomiting, diarrhea, abdominal pain, or melena  Neurologic:  negative for focal weakness, numbness, headache      Objective:     Vitals:    04/19/17 0501 04/19/17 0531 04/19/17 0631 04/19/17 0715   BP: 155/89 (!) 155/95 (!) 159/93 142/86   Pulse: 80 70 61 65   Resp: 22 22 24 29    Temp:    98.1 ??F (36.7 ??C)   SpO2: 97% 98% 97% 98%   Weight:           Intake and Output:   04/28 1901 - 04/30 0700  In: 4688.6 [I.V.:4638.6]  Out: 2450 [Urine:2450]        Physical Exam:          Constitutional:  the patient is well developed and in no acute distress on NC  EENMT:  Sclera clear, pupils equal, oral mucosa moist  Respiratory: clear  Cardiovascular:  RRR without M,G,R  Gastrointestinal: soft and non-tender; with positive bowel sounds.  Musculoskeletal: warm without cyanosis. There is trace lower leg edema.  Skin:  no jaundice or rashes; scattered ecchymoses  Neurologic: no gross neuro deficits     Psychiatric: Awake and alert and interactive with family      LINES:  PIV    DRIPS:   Awaiting argotraban    CXR:     4/28:        Venous US:    FINDINGS: There is compressibility, color-flow assignment and augmentation of  the venous waveform with calf compression in the right common femoral,  superficial femoral and popliteal veins. There is evidence of thrombus in the  left calf veins. The left deep venous system is otherwise normal.  ??  IMPRESSION     Acute left calf vein deep vein thrombosis.        LAB  Recent Labs      04/19/17   0758  04/18/17   2140  04/18/17   1116  04/18/17   0730  04/17/17   2041   GLUCPOC  128*  152*  125*  127*  260*      Recent Labs      04/19/17   0455  04/18/17   0403  04/17/17   2127  04/17/17   1225   WBC  6.6  10.1   --   13.6*   HGB  12.0  12.8  11.8  14.6   HCT  37.2  37.5  37.0  47.1*   PLT  107*  128*   --   212   INR   --    --    --   1.0     Recent Labs      04/19/17   0455  04/18/17   2148  04/18/17   0403  04/17/17   2330  04/17/17   1625  04/17/17   1225   NA  144   --   143   --    --  141   K  4.1  4.1  2.5*   --    --   3.6   CL  110*   --   107   --    --   99   CO2  25   --   29   --    --   21   GLU  136*   --   104*   --    --   384*   BUN  15   --   15   --    --   20   CREA  0.57*   --   0.62   --    --   1.51*   MG   --    --   2.0   --    --   2.5*   PHOS   --    --   2.9   --    --    --    CA  7.7*   --   7.0*   --    --   9.1   TROIQ   --    --    --   1.24*  1.74*  1.26*   ALB   --    --   2.2*   --    --   2.9*    TBILI   --    --    --    --    --   0.4   ALT   --    --    --    --    --   42   SGOT   --    --    --    --    --   31     Recent Labs      04/17/17   2114  04/17/17   1748  04/17/17   1230   PH  7.37  7.33*  7.15*   PCO2  36  26*  22*   PO2  82  104  147*   HCO3  21*  13*  7*     Recent Labs      04/18/17   0403  04/17/17   2127  04/17/17   1625   LAC  1.2  3.4*  9.8*       Assessment:  (Medical Decision Making)     Patient Active Problem List   Diagnosis Code   ??? Osteopenia M85.80   ??? HTN (hypertension) I10   ??? Hyperlipidemia E78.5   ??? Liver lesion K76.9   ??? 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HCC) I61.9   ??? 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 Memory difficulties R41.3   ??? Aphasia R47.01   ??? Seizure (HCC) R56.9   ??? 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 AKI (acute kidney injury) ([place]) N17.9   ??? Demand ischemia (HCC) I24.8   ??? Brain edema (HCC) G93.6   ??? Acute respiratory failure (HCC) J96.00   ??? SIRS (systemic inflammatory response syndrome) (HCC) R65.10   ??? Acute pulmonary embolism (HCC) I26.99   ??? Shock (HCC) R57.9   ??? Lactic acidosis E87.2   ??? Hypokalemia E87.6           Plan:  (Medical Decision Making)  Hospital Problems  Date Reviewed: 19-Apr-2017          Codes Class Noted POA    Hypokalemia ICD-10-CM: E87.6  ICD-9-CM: 276.8  04/18/2017 Unknown    Repleted    Secondary malignant melanoma of brain [facility]) (Chronic) ICD-10-CM: C79.31  ICD-9-CM: 198.3  04/17/2017 Yes    With brain and liver mets    Seizure ([place]) ICD-10-CM: R56.9  ICD-9-CM: 780.39  04/17/2017 Yes    No recurrence here. 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[facility]) (Chronic) ICD-10-CM: C78.7  ICD-9-CM: 197.7, 172.9  04/17/2017 Yes    Better on CT    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 [facility]) (Chronic) ICD-10-CM: C78.00  ICD-9-CM: 197.0, 172.9  04/17/2017 Yes    Better on CT     Acute respiratory failure [facility]) ICD-10-CM: J96.00  ICD-9-CM: 518.81  04/17/2017 Yes    Wean oxygen as tolerated    * (Principal)Acute pulmonary embolism ([place]) ICD-10-CM: I26.99  ICD-9-CM: 415.19  04/17/2017 Yes    Large clot burdern    Shock ([place]) ICD-10-CM: R57.9  ICD-9-CM: 785.50  04/17/2017 Yes    Resolved    Lactic acidosis ICD-10-CM: L93.7  ICD-9-CM: 276.2  04/17/2017 Yes    Resolved.        Thrombocytopenia, ? abx effect vs HIT vs chemotherapy- heparin stopped and HIT panel obtained-prognosis is very poor and palliative care is appropriate.  Family interested in home hospice which would be appropriate.    Nothing to add from pulmonary stand point- will sign off for now, call as needed.    More than 50% of the time documented was spent in face-to-face contact with the patient and in the care of the patient on the floor/unit where the patient is located.    Rogue Bussing, MD

## 2017-04-19 NOTE — Progress Notes (Signed)
Pharmacokinetic Consult to Pharmacist    Katelyn [name] is a 61 y.o. female being treated for sepsis with vancomycin and zosyn.       Weight: 68.6 kg (151 lb 3.8 oz)  Lab Results   Component Value Date/Time    BUN 15 04/19/2017 04:55 AM    Creatinine 0.57 (L) 04/19/2017 04:55 AM    WBC 6.6 04/19/2017 04:55 AM    Lactic acid 1.2 04/18/2017 04:03 AM    Lactic Acid (POC) 1.5 02/28/2017 10:19 AM      Estimated Creatinine Clearance: 102.1 mL/min (based on Cr of 0.57).    Lab Results   Component Value Date/Time    Vancomycin,trough 11.1 04/19/2017 09:32 AM       Day 3 of vancomycin.  Goal trough is 15-20.  Trough slightly low - will increase dose to 1250 mg q 12 hours.  Will continue to follow patient.      Thank you,  Thersa Salt, PharmD  Clinical Pharmacist  [phone]

## 2017-04-19 NOTE — Progress Notes (Signed)
PT Note:  PT/OT orders received/reviewed and evaluation attempted.  RN requested therapy be held today.  Evaluation will be re-attempted tomorrow as schedule and patient's status allow.  Thanks,  Janyth Pupa, PT, DPT

## 2017-04-19 NOTE — Progress Notes (Signed)
Lab work ordered per protocol, before starting Argatroban.

## 2017-04-19 NOTE — Progress Notes (Signed)
EEG started.

## 2017-04-19 NOTE — Progress Notes (Signed)
Hospitalist Progress Note    04/19/2017  Admit Date: 04/17/2017  8:30 PM   NAME: Katelyn [name]   DOB:  [birth year]   MRN:  [id number]   Attending: Margit Hanks, MD  PCP:  Dianna Rossetti, MD    SUBJECTIVE:     Katelyn [name] is a 61 years old female with pmhx of melanoma with metastatic disease to liver, lung, and brain with brain edema/seizures admitted on 4/28 for acute convulsive seizures with bilateral pulmonary emboli, left acute LE DVT and hemodynamic instability. Pt was admitted on SFE and transferred to [facility] ICU for further care. Pt was also noted to have GI bleeding, for which GI was consulted.   Pt is currently on heparin drip for bilateral PE and acute left LE DVT, IV keppra for seizures, IV zosyn/vanc for bacterial coverage, IV decadron for brain mets with edema.      4/30:  Pt appears to be confused  Repeatedly saying "It's quiet at home."  Family present at bedside  No significant improvement in clinical condition except for mild improvement in tremors  Discussed about code status and goals of care.    Pt is NPO.  No nausea/vomiting, fever, chills, diarrhea reported per nursing staff or family    10 point ROS is negative except what mentioned above      PHYSICAL EXAM       Visit Vitals   ??? BP 142/86 (BP 1 Location: Right arm, BP Patient Position: At rest)   ??? Pulse 65   ??? Temp 98.1 ??F (36.7 ??C)   ??? Resp 29   ??? Wt 68.6 kg (151 lb 3.8 oz)   ??? SpO2 98%   ??? Breastfeeding No   ??? BMI 23.69 kg/m2      Temp (24hrs), Avg:98.7 ??F (37.1 ??C), Min:98.1 ??F (36.7 ??C), Max:99.7 ??F (37.6 ??C)    Oxygen Therapy  O2 Sat (%): 98 % (04/19/17 0715)  Pulse via Oximetry: 61 beats per minute (04/19/17 0631)  O2 Device: Hi flow nasal cannula (04/19/17 0715)  O2 Flow Rate (L/min): 6 l/min (04/19/17 0715)    Intake/Output Summary (Last 24 hours) at 04/19/17 1105  Last data filed at 04/19/17 0528   Gross per 24 hour   Intake          2347.09 ml   Output             1125 ml   Net          1222.09 ml           General: NAD, pleasantly confused  HEENT:           Head NCAT, PERRLA+, no pallor or ict  Lungs:  CTA Bilaterally.   CVS:  Tachycardic, regular rhythm,??No MRG, No lower extremity edema.  Abdomen: Soft, Non distended, Non tender, Positive bowel sounds.  MSK:  No deformities, lesions, Spontaneously moves extremities.  Neurologic:?? GCS 15, following commands intermittently  Psychiatry:      Orientation and mood could not be assessed  Skin:   No rash/lesions. Good skin turgor  Heme/Lymph/Immune:  Has petechiae in UE, no ecchymoses      Recent Results (from the past 24 hour(s))   GLUCOSE, POC    Collection Time: 04/18/17 11:16 AM   Result Value Ref Range    Glucose (POC) 125 (H) 65 - 100 mg/dL   PTT    Collection Time: 04/18/17  2:51 PM   Result Value Ref Range  aPTT 172.4 (HH) 23.2 - 35.3 SEC   GLUCOSE, POC    Collection Time: 04/18/17  9:40 PM   Result Value Ref Range    Glucose (POC) 152 (H) 65 - 100 mg/dL   PTT    Collection Time: 04/18/17  9:48 PM   Result Value Ref Range    aPTT 134.0 (HH) 23.2 - 35.3 SEC   POTASSIUM    Collection Time: 04/18/17  9:48 PM   Result Value Ref Range    Potassium 4.1 3.5 - 5.1 mmol/L   METABOLIC PANEL, BASIC    Collection Time: 04/19/17  4:55 AM   Result Value Ref Range    Sodium 144 136 - 145 mmol/L    Potassium 4.1 3.5 - 5.1 mmol/L    Chloride 110 (H) 98 - 107 mmol/L    CO2 25 21 - 32 mmol/L    Anion gap 9 7 - 16 mmol/L    Glucose 136 (H) 65 - 100 mg/dL    BUN 15 8 - 23 MG/DL    Creatinine 0.57 (L) 0.6 - 1.0 MG/DL    GFR est AA >60 >60 ml/min/1.37m    GFR est non-AA >60 >60 ml/min/1.726m   Calcium 7.7 (L) 8.3 - 10.4 MG/DL   CBC WITH AUTOMATED DIFF    Collection Time: 04/19/17  4:55 AM   Result Value Ref Range    WBC 6.6 4.3 - 11.1 K/uL    RBC 4.07 4.05 - 5.25 M/uL    HGB 12.0 11.7 - 15.4 g/dL    HCT 37.2 35.8 - 46.3 %    MCV 91.4 79.6 - 97.8 FL    MCH 29.5 26.1 - 32.9 PG    MCHC 32.3 31.4 - 35.0 g/dL    RDW 17.8 (H) 11.9 - 14.6 %    PLATELET 107 (L) 150 - 450 K/uL     MPV 8.6 (L) 10.8 - 14.1 FL    DF AUTOMATED      NEUTROPHILS 83 (H) 43 - 78 %    LYMPHOCYTES 13 13 - 44 %    MONOCYTES 3 (L) 4.0 - 12.0 %    EOSINOPHILS 0 (L) 0.5 - 7.8 %    BASOPHILS 0 0.0 - 2.0 %    IMMATURE GRANULOCYTES 1 0.0 - 5.0 %    ABS. NEUTROPHILS 5.6 1.7 - 8.2 K/UL    ABS. LYMPHOCYTES 0.9 0.5 - 4.6 K/UL    ABS. MONOCYTES 0.2 0.1 - 1.3 K/UL    ABS. EOSINOPHILS 0.0 0.0 - 0.8 K/UL    ABS. BASOPHILS 0.0 0.0 - 0.2 K/UL    ABS. IMM. GRANS. 0.0 0.0 - 0.5 K/UL   PTT    Collection Time: 04/19/17  4:55 AM   Result Value Ref Range    aPTT 73.9 (H) 23.2 - 35.3 SEC   GLUCOSE, POC    Collection Time: 04/19/17  7:58 AM   Result Value Ref Range    Glucose (POC) 128 (H) 65 - 100 mg/dL   PROTHROMBIN TIME + INR    Collection Time: 04/19/17  9:32 AM   Result Value Ref Range    Prothrombin time 12.5 11.5 - 14.5 sec    INR 1.0     PTT    Collection Time: 04/19/17  9:32 AM   Result Value Ref Range    aPTT 39.7 (H) 23.2 - 35.3 SEC   HEPATIC FUNCTION PANEL    Collection Time: 04/19/17  9:32 AM   Result Value Ref  Range    Protein, total 5.3 (L) 6.3 - 8.2 g/dL    Albumin 2.2 (L) 3.2 - 4.6 g/dL    Globulin 3.1 2.3 - 3.5 g/dL    A-G Ratio 0.7 (L) 1.2 - 3.5      Bilirubin, total 0.5 0.2 - 1.1 MG/DL    Bilirubin, direct 0.1 <0.4 MG/DL    Alk. phosphatase 77 50 - 136 U/L    AST (SGOT) 53 (H) 15 - 37 U/L    ALT (SGPT) 66 (H) 12 - 65 U/L   PLATELET COUNT    Collection Time: 04/19/17  9:32 AM   Result Value Ref Range    PLATELET 112 (L) 150 - 450 K/uL   VANCOMYCIN, TROUGH    Collection Time: 04/19/17  9:32 AM   Result Value Ref Range    Vancomycin,trough 11.1 5 - 20 ug/mL         Imaging /Procedures /Studies   All diagnostic imaging personally reviewed by me.    BILATERAL LOWER EXTREMITY VENOUS DUPLEX ULTRASOUND.  ??  HISTORY:  Pain.  ??  TECHNIQUE: Direct skin-contact high resolution grayscale images, color-flow  Doppler and duplex waveform analysis.  ??  FINDINGS: There is compressibility, color-flow assignment and augmentation of   the venous waveform with calf compression in the right common femoral,  superficial femoral and popliteal veins. There is evidence of thrombus in the  left calf veins. The left deep venous system is otherwise normal.  ??  IMPRESSION  IMPRESSION: Acute left calf vein deep vein thrombosis.    Abdomen x-ray single view.  ??  HISTORY: Dobbhoff tube placement.  ??  COMPARISON: None.  ??  FINDINGS: Dobbhoff tube its tip in the gastric body. Bowel gas pattern is  nonspecific.  ??  IMPRESSION  IMPRESSION:  ??  Dobbhoff tube is tip in the gastric body.      CT chest w cont:  IMPRESSION:    1.  EXTENSIVE BILATERAL PULMONARY EMBOLI.  2.  INTERVAL IMPROVEMENT IN PULMONARY PARENCHYMAL AND HEPATIC METASTATIC DISEASE  SINCE OCTOBER 2017."      NONCONTRAST HEAD CT  ??  CLINICAL HISTORY:  Seizure-like activity with no new intracranial metastases  from malignant melanoma.  ??  COMPARISON:  February 28, 2017.  ??  REPORT:   Multiple bilateral parenchymal metastases are again noted.  There is  less edema adjacent to the dominant left parietal metastasis.  No new or  enlarging masses evident.  There is no significant midline shift.  The  ventricles are normal in size and configuration, accounting for the patient's  age.  Orbits and  paranasal sinuses are clear where imaged. Bone windows  demonstrate no definite fracture or destruction.  ??  IMPRESSION  IMPRESSION:     MULTIFOCAL METASTASES WITH LESS MASS EFFECT THAN ON MARCH 11.   NO NEW INTRACRANIAL ABNORMALITY IS IDENTIFIED.    PORTABLE CHEST, April 17, 2017 at 1246 hours  ??  CLINICAL HISTORY:  Shortness of breath and seizure-like activity today with  metastatic melanoma.  ??  COMPARISON:  March 12, 2017.  ??  FINDINGS:  AP erect image demonstrates no confluent infiltrate or significant  pleural fluid.  There is mild left basilar and medial right upper lobe  atelectasis/infiltrate.  The heart size is within normal limits without evidence   of congestive heart failure or pneumothorax.  The bony thorax appears intact on  this view.  There are overlying radiopaque support devices.  ??  IMPRESSION  IMPRESSION:  MILD RIGHT UPPER LOBE AND LEFT BASILAR ATELECTASIS/INFILTRATE.    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 (HCC) (Chronic) ICD-10-CM: C78.7  ICD-9-CM: 197.7, 172.9  04/17/2017 - Present Yes        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 [facility]) (Chronic) ICD-10-CM: C78.00  ICD-9-CM: 197.0, 172.9  04/17/2017 - Present Yes        Acute respiratory failure ([place]) ICD-10-CM: J96.00  ICD-9-CM: 518.81  04/17/2017 - Present Yes        * (Principal)Acute pulmonary embolism ([place]) ICD-10-CM: I26.99  ICD-9-CM: 415.19  04/17/2017 - Present Yes        Shock ([place]) ICD-10-CM: R57.9  ICD-9-CM: 785.50  04/17/2017 - Present Yes        Lactic acidosis ICD-10-CM: E87.2  ICD-9-CM: 276.2  04/17/2017 - Present Yes                  Plan:  - Acute encephalopathy likely due to underlying seizure    Continue Keppra 500 mg IV q12h and IV depakote for q12h  EEG pending  Improvement in tremors  Prn ativan for agitation and sedation    - Bilateral PE and left acute DVT  Heparin drip stopped for > 50% drop in platelet count with concerns for HIT, HIT panel is pending  Will be started on Argatroban drip  High risk of intracranial bleed.  - tachypnea and tachycardia likely coming from PE   Prn hydralazine for SBP> 180  - elevated troponin likely from demand ischemia due to acute bilateral PE. No chest pain reported.    - hypokalemia resolved, 4.1 today. Oral KCL stopped  - lactic acidosis resolved, 1.2   - metastatic melanoma with brain, liver, lung meds: extremely poor prognosis    - speech eval is pending  - empiric antibiotics, IV vanc and zosyn, concerns for aspiration PNA.        DVT Prophylaxis: heparin drip  Patient is DNR/DNI  Discussed with family about hospice and comfort care. Patient's family is receptive for not pursuing any aggressive life prolonging measures.   Pt's daughter mentioned that they were following up with [facility] as outpatient over past 2 months, would like them be notified for patient's condition. I have spoken to [facility] over the phone with the family.  Pt's husband is wanting to get Dr. Jenetta Loges to evaluate the patient as well.  I have explained to the family that currently we care dealing with complications of her advanced metastatic melanoma, and there is high potential of rapid decline.  Nursing staff notified, case manager on board.        Myrtie Neither, MD

## 2017-04-20 LAB — CBC WITH AUTOMATED DIFF
ABS. BASOPHILS: 0 10*3/uL (ref 0.0–0.2)
ABS. EOSINOPHILS: 0 10*3/uL (ref 0.0–0.8)
ABS. IMM. GRANS.: 0 10*3/uL (ref 0.0–0.5)
ABS. LYMPHOCYTES: 0.8 10*3/uL (ref 0.5–4.6)
ABS. MONOCYTES: 0.2 10*3/uL (ref 0.1–1.3)
ABS. NEUTROPHILS: 5.8 10*3/uL (ref 1.7–8.2)
BASOPHILS: 0 % (ref 0.0–2.0)
EOSINOPHILS: 0 % — ABNORMAL LOW (ref 0.5–7.8)
HCT: 36.9 % (ref 35.8–46.3)
HGB: 12.4 g/dL (ref 11.7–15.4)
IMMATURE GRANULOCYTES: 0 % (ref 0.0–5.0)
LYMPHOCYTES: 12 % — ABNORMAL LOW (ref 13–44)
MCH: 30.4 PG (ref 26.1–32.9)
MCHC: 33.6 g/dL (ref 31.4–35.0)
MCV: 90.4 FL (ref 79.6–97.8)
MONOCYTES: 2 % — ABNORMAL LOW (ref 4.0–12.0)
MPV: 8.3 FL — ABNORMAL LOW (ref 10.8–14.1)
NEUTROPHILS: 86 % — ABNORMAL HIGH (ref 43–78)
PLATELET: 110 10*3/uL — ABNORMAL LOW (ref 150–450)
RBC: 4.08 M/uL (ref 4.05–5.25)
RDW: 17.6 % — ABNORMAL HIGH (ref 11.9–14.6)
WBC: 6.8 10*3/uL (ref 4.3–11.1)

## 2017-04-20 LAB — GLUCOSE, POC: Glucose (POC): 127 mg/dL — ABNORMAL HIGH (ref 65–100)

## 2017-04-20 LAB — PTT: aPTT: 63.7 s — ABNORMAL HIGH (ref 23.2–35.3)

## 2017-04-20 MED ORDER — DIVALPROEX 500 MG TAB, DELAYED RELEASE
500 mg | Freq: Two times a day (BID) | ORAL | Status: DC
Start: 2017-04-20 — End: 2017-04-20

## 2017-04-20 MED ORDER — DEXAMETHASONE 4 MG TAB
4 mg | Freq: Two times a day (BID) | ORAL | Status: DC
Start: 2017-04-20 — End: 2017-04-20
  Administered 2017-04-20: 17:00:00 via ORAL

## 2017-04-20 MED ORDER — DIVALPROEX 500 MG TAB, DELAYED RELEASE
500 mg | ORAL_TABLET | Freq: Two times a day (BID) | ORAL | 2 refills | Status: DC
Start: 2017-04-20 — End: 2017-04-20

## 2017-04-20 MED ORDER — DIVALPROEX 500 MG TAB, DELAYED RELEASE
500 mg | ORAL_TABLET | Freq: Two times a day (BID) | ORAL | 2 refills | Status: AC
Start: 2017-04-20 — End: 2017-05-20

## 2017-04-20 MED ORDER — LEVETIRACETAM 500 MG TAB
500 mg | Freq: Two times a day (BID) | ORAL | Status: DC
Start: 2017-04-20 — End: 2017-04-20

## 2017-04-20 MED ORDER — ENOXAPARIN 120 MG/0.8 ML SUB-Q SYRINGE
120 mg/0.8 mL | Freq: Two times a day (BID) | SUBCUTANEOUS | 0 refills | Status: AC
Start: 2017-04-20 — End: 2017-05-04

## 2017-04-20 MED ORDER — PANTOPRAZOLE 40 MG TAB, DELAYED RELEASE
40 mg | Freq: Every day | ORAL | Status: DC
Start: 2017-04-20 — End: 2017-04-20

## 2017-04-20 MED ORDER — ENOXAPARIN 120 MG/0.8 ML SUB-Q SYRINGE
120 mg/0.8 mL | Freq: Two times a day (BID) | SUBCUTANEOUS | 0 refills | Status: DC
Start: 2017-04-20 — End: 2017-04-20

## 2017-04-20 MED ORDER — ENOXAPARIN 80 MG/0.8 ML SUB-Q SYRINGE
80 mg/0.8 mL | Freq: Once | SUBCUTANEOUS | Status: AC
Start: 2017-04-20 — End: 2017-04-20
  Administered 2017-04-20: 17:00:00 via SUBCUTANEOUS

## 2017-04-20 MED FILL — PIPERACILLIN-TAZOBACTAM 4.5 GRAM IV SOLR: 4.5 gram | INTRAVENOUS | Qty: 4.5

## 2017-04-20 MED FILL — LORAZEPAM 2 MG/ML IJ SOLN: 2 mg/mL | INTRAMUSCULAR | Qty: 1

## 2017-04-20 MED FILL — LOVENOX 80 MG/0.8 ML SUBCUTANEOUS SYRINGE: 80 mg/0.8 mL | SUBCUTANEOUS | Qty: 0.8

## 2017-04-20 MED FILL — VANCOMYCIN IN 0.9 % SODIUM CHLORIDE 1.25 GRAM/250 ML IV: 1.25 gram/250 mL | INTRAVENOUS | Qty: 250

## 2017-04-20 MED FILL — DEXAMETHASONE SODIUM PHOSPHATE 4 MG/ML IJ SOLN: 4 mg/mL | INTRAMUSCULAR | Qty: 1

## 2017-04-20 MED FILL — HYDRALAZINE 20 MG/ML IJ SOLN: 20 mg/mL | INTRAMUSCULAR | Qty: 1

## 2017-04-20 MED FILL — ARGATROBAN 100 MG/ML IV SOLN: 100 mg/mL | INTRAVENOUS | Qty: 0.5

## 2017-04-20 MED FILL — LEVETIRACETAM 500 MG/5 ML IV SOLN: 500 mg/5 mL | INTRAVENOUS | Qty: 7.5

## 2017-04-20 MED FILL — PANTOPRAZOLE 40 MG IV SOLR: 40 mg | INTRAVENOUS | Qty: 40

## 2017-04-20 MED FILL — VALPROATE SODIUM 100 MG/ML IV: 500 mg/5 mL (100 mg/mL) | INTRAVENOUS | Qty: 5

## 2017-04-20 NOTE — Discharge Summary (Signed)
Discharge Summary by Myrtie Neither, MD at 04/20/17 1301                Author: Myrtie Neither, MD  Service: Internal Medicine  Author Type: Physician       Filed: 04/20/17 1303  Date of Service: 04/20/17 1301  Status: Signed          Editor: Myrtie Neither, MD (Physician)                                   Hospitalist Discharge Summary        Patient ID:   Katelyn [name]   [id number]   61 y.o.   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, lungs and brain   Acute encephalopathy due to seizures   Thrombocytopenia    GI bleed                  Principal Problem:     Acute pulmonary embolism ([place]) (04/17/2017)      Active Problems:     Secondary malignant melanoma of brain ([place]) (04/17/2017)        Seizure ([place]) (04/17/2017)        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 ([place]) (04/17/2017)        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 In summary, the patient is  61 years old female with pmhx of melanoma with metastatic disease to liver, lung, and brain with brain edema/seizures admitted on 4/28 for acute convulsive seizures with bilateral pulmonary emboli, left acute LE DVT and hemodynamic instability. Pt was  admitted on SFE and  transferred to [facility] ICU for further care. Pt was also noted to have GI bleeding, for which GI was consulted.    For first 24 hours, pt was started on IV heparin drip with IV keppra and depakene for seizure. Tele neuro thought that pt was in partial status epilepticus. Initially pt was very tremulous but  after starting IV depakene, tremors resolved. Pt's confusion and encephalopathy hasn't significantly changed.    I have met with patient's family, according to them, they felt that they were not warned about potential complications of pt's cancer, and all these new medical issues are sudden and quite overwhelming.   I explained to them that it's always very difficult to have specific timeline for when the complications would happen, but stage 4 cancer with brain mets has a  very poor diagnosis to start with.  Blood clots are common complications in pt's with malignancy and pt's current worsening of seizure is due to multiple brain mets.   Also, continuing blood thinners will always impose risk of life threatening bleeding in the brain or elsewhere in the body, specially GI tract which is also one of the active issues with the patient.   Due extensive complications and rapid decline in the pt's overall health, hospice and comfort care options were discussed with the family. Off note, pt's daughter mentioned that they were working  with [facility] hosp[facility] for past 2 months.   Patient's family agreed with making Ms. Waren comfortable and not pursuing any invasive/aggressive measures. Pt was made DNR/DNI on 4/30. Family is interested in taking pt home with hospice care.   I have stopped all the medications which will not improve quality of life. Anti seizure medications and prednisone should be continued to minimize further seizure episodes. It's quite possible  that pt may have further seizure episodes due to progressive brain disease, in such case, prn ativan or versed can be used based on hospice services.   I have allowed  for pleasure food.   After discussion with hospice, pt can be discharged on therapeutic lovenox and will be bridged with coumadin by hospice at home. Risk of bleeding has been explained.   Rest of the hospital course was uneventful.      Significant Diagnostic Studies:    BILATERAL LOWER EXTREMITY VENOUS DUPLEX ULTRASOUND.   ????   HISTORY: ??Pain.   ????   TECHNIQUE: Direct skin-contact high resolution grayscale images, color-flow   Doppler and duplex waveform analysis.   ????   FINDINGS: There is compressibility, color-flow assignment and augmentation of   the venous waveform with calf compression in the right common femoral,   superficial femoral and popliteal veins. There is evidence of thrombus in the   left calf veins. The left deep venous system is otherwise normal.   ????   IMPRESSION   IMPRESSION: Acute left calf vein deep vein thrombosis.   ????   Abdomen x-ray single view.   ????   HISTORY: Dobbhoff tube placement.   ????   COMPARISON: None.   ????   FINDINGS: Dobbhoff tube its tip in the gastric body. Bowel gas pattern is   nonspecific.   ????   IMPRESSION   IMPRESSION:   ????   Dobbhoff tube is tip in the gastric body.   ??   CT chest w contrast:   IMPRESSION: ??   1. ??EXTENSIVE BILATERAL PULMONARY EMBOLI.   2. ??INTERVAL IMPROVEMENT IN PULMONARY PARENCHYMAL AND HEPATIC METASTATIC DISEASE   SINCE OCTOBER 2017."         Labs:  Results:                  Chemistry  Recent Labs          04/19/17    0932   04/19/17    0455   04/18/17    2148   04/18/17    0403      GLU    --    136*    --    104*      NA    --    144    --    143      K    --    4.1   4.1   2.5*      CL    --  110*    --    107      CO2    --    25    --    29      BUN    --    15    --    15      CREA    --    0.57*    --    0.62      CA    --    7.7*    --    7.0*      AGAP    --    9    --    7      AP   77    --     --     --       TP   5.3*    --     --     --       ALB   2.2*    --     --    2.2*      GLOB   3.1    --     --     --       AGRAT   0.7*    --      --     --               CBC w/Diff  Recent Labs          04/20/17    0350   04/19/17    0932   04/19/17    0455   04/18/17    0403      WBC   6.8    --    6.6   10.1      RBC   4.08    --    4.07   4.25      HGB   12.4    --    12.0   12.8      HCT   36.9    --    37.2   37.5      PLT   110*   112*   107*   128*      GRANS   86*    --    83*   82*      LYMPH   12*    --    13   12*      EOS   0*    --    0*   0*           Cardiac Enzymes  Recent Labs          04/17/17    1625      CPK   83           Coagulation  Recent Labs          04/20/17    0350   04/19/17    1722     04/19/17    0932      PTP    --     --     --    12.5      INR    --     --     --    1.0      APTT   63.7*   64.0*    < >   39.7*       < > =  values in this interval not displayed.               Lipid Panel  Lab Results      Component  Value  Date/Time        Cholesterol, total  162  10/14/2016 09:48 AM        HDL Cholesterol  63  10/14/2016 09:48 AM        LDL, calculated  91  10/14/2016 09:48 AM        VLDL, calculated  8  10/14/2016 09:48 AM        Triglyceride  42  10/14/2016 09:48 AM                 BNP  No results for input(s): BNPP in the last 72 hours.        Liver Enzymes  Recent Labs          04/19/17    0932      TP   5.3*      ALB   2.2*      AP   77      SGOT   53*              Thyroid Studies  Lab Results      Component  Value  Date/Time        TSH  3.250  08/27/2015 08:08 AM                   Discharge Exam:     Visit Vitals         ?  BP  (!) 155/97     ?  Pulse  87     ?  Temp  97.6 ??F (36.4 ??C)     ?  Resp  22     ?  Wt  68.6 kg (151 lb 3.8 oz)     ?  SpO2  92%     ?  Breastfeeding  No         ?  BMI  23.69 kg/m2        General:????????????????????????????????????????NAD, pleasantly confused   HEENT: ????????????????????Head NCAT, PERRLA+, no pallor or ict   Lungs:??????????????????????????????????????????????????????????????????CTA Bilaterally.    CVS:????????????????????????????????????????????????????????????????????????Tachycardic, regular rhythm,??No MRG, No lower  extremity edema.   Abdomen:????????????????????????????????????Soft, Non  distended, Non tender, Positive bowel sounds.   MSK:??????????????????????????????????????????????????????????????????????No deformities, lesions, Spontaneously moves extremities.   Neurologic:????????????????????????????????GCS 15, following commands intermittently   Psychiatry: ??????????Orientation and mood could not be assessed   Skin: ??????????????????????????????????????????????????????????????????????No rash/lesions. Good skin turgor   Heme/Lymph/Immune: ??Has petechiae in UE, no ecchymoses      Disposition: home hospice   Discharge Condition: stable   Patient Instructions:      Current Discharge Medication List              START taking these medications          Details        divalproex DR (DEPAKOTE) 500 mg tablet  Take 1 Tab by mouth two (2) times a day for 30 days.   Qty: 60 Tab, Refills:  2               enoxaparin (LOVENOX) 120 mg/0.8 mL injection  70 mg by SubCUTAneous route every twelve (12) hours for 14 days.   Qty: 13.6 mL, Refills:  0                     CONTINUE these medications which have  NOT CHANGED          Details        dexamethasone (DECADRON) 4 mg tablet  Take 4 mg by mouth four (4) times daily.               levETIRAcetam (KEPPRA) 500 mg tablet  Take 1 Tab by mouth two (2) times a day.   Qty: 60 Tab, Refills:  11               amLODIPine (NORVASC) 10 mg tablet  Take 1 Tab by mouth nightly.   Qty: 90 Tab, Refills:  3          Associated Diagnoses: Essential hypertension                     STOP taking these medications                  spironolactone (ALDACTONE) 25 mg tablet  Comments:    Reason for Stopping:                      traMADol (ULTRAM) 50 mg tablet  Comments:    Reason for Stopping:                      dabrafenib (TAFINLAR) 75 mg cap  Comments:    Reason for Stopping:                      trametinib (MEKINIST) 2 mg tab  Comments:    Reason for Stopping:                      OTHER,NON-FORMULARY,  Comments:    Reason for Stopping:                      ibuprofen (MOTRIN) 200 mg tablet  Comments:    Reason for Stopping:                      losartan (COZAAR) 100 mg  tablet  Comments:    Reason for Stopping:                      acetaminophen (TYLENOL) 325 mg tablet  Comments:    Reason for Stopping:                      LORazepam (ATIVAN) 1 mg tablet  Comments:    Reason for Stopping:                      simvastatin (ZOCOR) 20 mg tablet  Comments:    Reason for Stopping:                      fluticasone (FLONASE) 50 mcg/actuation nasal spray  Comments:    Reason for Stopping:                      cetirizine (ZYRTEC) 10 mg tablet  Comments:    Reason for Stopping:                             Activity: Bedrest   Diet: Comfort feeding   Wound Care: None needed      Follow-up   ??  Follow up with hospice physician at home.   Time spent to discharge patient 35 minutes   Signed:   Myrtie Neither, MD   04/20/2017   1:01 PM

## 2017-04-20 NOTE — Progress Notes (Signed)
A follow up visit was made to the patient. Support was provided. Patience was being transferred to hospice.      [employer]

## 2017-04-20 NOTE — Progress Notes (Signed)
Bedside and Verbal shift change report given to Royce Macadamia, RN (oncoming nurse) by Hildred Alamin, RN (offgoing nurse). Report included the following information SBAR, Kardex, ED Summary, Intake/Output, MAR, Recent Results and Cardiac Rhythm NSR.  Dual skin assessment reviewed.    Pt lying quietly in bed, in NAD.  Oriented to person and recognizes family members at times.  NSR on monitor, HR 84.  BP 135/83.  Lung sounds clear/diminished.  O2 Sat 94% on 8L HFNC, RR unlabored at rest.  NGT in place and clamped.  However, diet ordered for pt for pleasure eating.  Foley in place w/good UOP.  Pt denies any pain at this time. Daughter and husband at bedside.    Plan is for pt to go home with [facility] today.  Palliative care following to help assist with transition.

## 2017-04-20 NOTE — Discharge Summary (Signed)
Hospitalist Discharge Summary     Patient ID:  Katelyn [name]  [id number]  60 y.o.  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, lungs and brain  Acute encephalopathy due to seizures  Thrombocytopenia   GI bleed            Principal Problem:    Acute pulmonary embolism ([place]) (04/17/2017)    Active Problems:    Secondary malignant melanoma of brain ([place]) (04/17/2017)      Seizure ([place]) (04/17/2017)      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 ([place]) (04/17/2017)      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 In summary, the patient is 61 years old female with pmhx of melanoma with metastatic disease to liver, lung, and brain with brain edema/seizures admitted on 4/28 for acute convulsive seizures with bilateral pulmonary emboli, left acute LE DVT and hemodynamic instability. Pt was admitted on SFE and transferred to [facility] ICU for further care. Pt was also noted to have GI bleeding, for which GI was consulted.   For first 24 hours, pt was started on IV heparin drip with IV keppra and depakene for seizure. Tele neuro thought that pt was in partial status epilepticus. Initially pt was very tremulous but after starting IV  depakene, tremors resolved. Pt's confusion and encephalopathy hasn't significantly changed.   I have met with patient's family, according to them, they felt that they were not warned about potential complications of pt's cancer, and all these new medical issues are sudden and quite overwhelming.  I explained to them that it's always very difficult to have specific timeline for when the complications would happen, but stage 4 cancer with brain mets has a very poor diagnosis to start with. Blood clots are common complications in pt's with malignancy and pt's current worsening of seizure is due to multiple brain mets.  Also, continuing blood thinners will always impose risk of life threatening bleeding in the brain or elsewhere in the body, specially GI tract which is also one of the active issues with the patient.  Due extensive complications and rapid decline in the pt's overall health, hospice and comfort care options were discussed with the family. Off note, pt's daughter mentioned that they were working with [facility] hosp[facility] for past 2 months.  Patient's family agreed with making [name] comfortable and not pursuing any invasive/aggressive measures. Pt was made DNR/DNI on 4/30. Family is interested in taking pt home with hospice care.  I have stopped all the medications which will not improve quality of life. Anti seizure medications and prednisone should be continued to minimize further seizure episodes. It's quite possible that  pt may have further seizure episodes due to progressive brain disease, in such case, prn ativan or versed can be used based on hospice services.  I have allowed for pleasure food.  After discussion with hospice, pt can be discharged on therapeutic lovenox and will be bridged with coumadin by hospice at home. Risk of bleeding has been explained.  Rest of the hospital course was uneventful.    Significant Diagnostic Studies:   BILATERAL LOWER EXTREMITY VENOUS DUPLEX ULTRASOUND.  ????   HISTORY: ??Pain.  ????  TECHNIQUE: Direct skin-contact high resolution grayscale images, color-flow  Doppler and duplex waveform analysis.  ????  FINDINGS: There is compressibility, color-flow assignment and augmentation of  the venous waveform with calf compression in the right common femoral,  superficial femoral and popliteal veins. There is evidence of thrombus in the  left calf veins. The left deep venous system is otherwise normal.  ????  IMPRESSION  IMPRESSION: Acute left calf vein deep vein thrombosis.  ????  Abdomen x-ray single view.  ????  HISTORY: Dobbhoff tube placement.  ????  COMPARISON: None.  ????  FINDINGS: Dobbhoff tube its tip in the gastric body. Bowel gas pattern is  nonspecific.  ????  IMPRESSION  IMPRESSION:  ????  Dobbhoff tube is tip in the gastric body.  ??  CT chest w contrast:  IMPRESSION: ??  1. ??EXTENSIVE BILATERAL PULMONARY EMBOLI.  2. ??INTERVAL IMPROVEMENT IN PULMONARY PARENCHYMAL AND HEPATIC METASTATIC DISEASE  SINCE OCTOBER 2017."    Labs: Results:       Chemistry Recent Labs      04/19/17   0932  04/19/17   0455  04/18/17   2148  04/18/17   0403   GLU   --   136*   --   104*   NA   --   144   --   143   K   --   4.1  4.1  2.5*   CL   --   110*   --   107   CO2   --   25   --   29   BUN   --   15   --   15   CREA   --   0.57*   --   0.62   CA   --   7.7*   --   7.0*   AGAP   --   9   --   7   AP  77   --    --    --    TP  5.3*   --    --    --    ALB  2.2*   --    --   2.2*   GLOB  3.1   --    --    --    AGRAT  0.7*   --    --    --       CBC w/Diff Recent Labs      04/20/17   0350  04/19/17   0932  04/19/17   0455  04/18/17   0403   WBC  6.8   --   6.6  10.1   RBC  4.08   --   4.07  4.25   HGB  12.4   --   12.0  12.8   HCT  36.9   --   37.2  37.5   PLT  110*  112*  107*  128*   GRANS  86*   --   83*  82*   LYMPH  12*   --   13  12*   EOS  0*   --   0*  0*      Cardiac Enzymes Recent Labs      04/17/17   1625   CPK  83      Coagulation Recent Labs      04/20/17   0350  04/19/17   1722   04/19/17    0932   PTP   --    --    --   12.5   INR   --    --    --   1.0   APTT  63.7*  64.0*   < >  39.7*    < > = values in this interval not displayed.       Lipid Panel Lab Results   Component Value Date/Time    Cholesterol, total 162 10/14/2016 09:48 AM    HDL Cholesterol 63 10/14/2016 09:48 AM    LDL, calculated 91 10/14/2016 09:48 AM    VLDL, calculated 8 10/14/2016 09:48 AM    Triglyceride 42 10/14/2016 09:48 AM      BNP No results for input(s): BNPP in the last 72 hours.   Liver Enzymes Recent Labs      04/19/17   0932   TP  5.3*   ALB  2.2*   AP  77   SGOT  53*      Thyroid Studies Lab Results   Component Value Date/Time    TSH 3.250 08/27/2015 08:08 AM            Discharge Exam:  Visit Vitals   ??? BP (!) 155/97   ??? Pulse 87   ??? Temp 97.6 ??F (36.4 ??C)   ??? Resp 22   ??? Wt 68.6 kg (151 lb 3.8 oz)   ??? SpO2 92%   ??? Breastfeeding No   ??? BMI 23.69 kg/m2     General:????????????????????????????????????????NAD, pleasantly confused  HEENT: ????????????????????Head NCAT, PERRLA+, no pallor or ict  Lungs:??????????????????????????????????????????????????????????????????CTA Bilaterally.   CVS:????????????????????????????????????????????????????????????????????????Tachycardic, regular rhythm,??No MRG, No lower extremity edema.  Abdomen:????????????????????????????????????Soft, Non distended, Non tender, Positive bowel sounds.  MSK:??????????????????????????????????????????????????????????????????????No deformities, lesions, Spontaneously moves extremities.  Neurologic:????????????????????????????????GCS 15, following commands intermittently  Psychiatry: ??????????Orientation and mood could not be assessed  Skin: ??????????????????????????????????????????????????????????????????????No rash/lesions. Good skin turgor  Heme/Lymph/Immune: ??Has petechiae in UE, no ecchymoses    Disposition: home hospice  Discharge Condition: stable  Patient Instructions:   Current Discharge Medication List      START taking these medications    Details   divalproex DR (DEPAKOTE) 500 mg tablet Take 1 Tab by mouth two (2) times a day for 30 days.  Qty: 60 Tab, Refills: 2      enoxaparin (LOVENOX) 120 mg/0.8 mL injection 70 mg by SubCUTAneous route  every twelve (12) hours for 14 days.  Qty: 13.6 mL, Refills: 0         CONTINUE these medications which have NOT CHANGED    Details   dexamethasone (DECADRON) 4 mg tablet Take 4 mg by mouth four (4) times daily.      levETIRAcetam (KEPPRA) 500 mg tablet Take 1 Tab by mouth two (2) times a day.  Qty: 60 Tab, Refills: 11      amLODIPine (NORVASC) 10 mg tablet Take 1 Tab by mouth nightly.  Qty: 90 Tab, Refills: 3    Associated  Diagnoses: Essential hypertension         STOP taking these medications       spironolactone (ALDACTONE) 25 mg tablet Comments:   Reason for Stopping:         traMADol (ULTRAM) 50 mg tablet Comments:   Reason for Stopping:         dabrafenib (TAFINLAR) 75 mg cap Comments:   Reason for Stopping:         trametinib (MEKINIST) 2 mg tab Comments:   Reason for Stopping:         OTHER,NON-FORMULARY, Comments:   Reason for Stopping:         ibuprofen (MOTRIN) 200 mg tablet Comments:   Reason for Stopping:         losartan (COZAAR) 100 mg tablet Comments:   Reason for Stopping:         acetaminophen (TYLENOL) 325 mg tablet Comments:   Reason for Stopping:         LORazepam (ATIVAN) 1 mg tablet Comments:   Reason for Stopping:         simvastatin (ZOCOR) 20 mg tablet Comments:   Reason for Stopping:         fluticasone (FLONASE) 50 mcg/actuation nasal spray Comments:   Reason for Stopping:         cetirizine (ZYRTEC) 10 mg tablet Comments:   Reason for Stopping:               Activity: Bedrest  Diet: Comfort feeding  Wound Care: None needed    Follow-up  ??     Follow up with hospice physician at home.  Time spent to discharge patient 35 minutes  Signed:  Myrtie Neither, MD  04/20/2017  1:01 PM

## 2017-04-20 NOTE — Progress Notes (Signed)
Per chart review, patient's family has elected for comfort care/ hospice services. Will sign off.   Callaway P Banks, OTR/L

## 2017-04-20 NOTE — Progress Notes (Signed)
[facility] here to pick up patient.  Pt discharged home with [facility].  Admissions staff w/[facility] to see patient on arrival home.  PIVs x 3 to Left arm all removed.  Foley catheter left in place for comfort.  Prescriptions for lovenox and depakote given to pt's daughter at bedside.  All patient belongings sent with pt's daughter.

## 2017-04-20 NOTE — Progress Notes (Signed)
Spoke with Scientist, clinical (histocompatibility and immunogenetics) RN [facility]).  She states equipment should be delivered to patient's home between hours of 1p-2p.  Per Gwinda Passe they should be ready for patient any time after 2:30pm.  CM will notify Gwinda Passe of transport time when set up.

## 2017-04-20 NOTE — Progress Notes (Signed)
Problem: Mobility Impaired (Adult and Pediatric)  Goal: *Acute Goals and Plan of Care (Insert Text)  No goals set.    PHYSICAL THERAPY: Initial Assessment, Discharge 04/20/2017  INPATIENT: Hospital Day: 3  Payor: Theme park manager / Plan: [place] HMO / Product Type: HMO /      NAME/AGE/GENDER: Katelyn [name] is Katelyn [name] 61 y.o. female   PRIMARY DIAGNOSIS: Extensive Bilateral Pulmonary Emboli  Melanoma  Metastatic Cancer  Seizures  Acute pulmonary embolism ([place]) Acute pulmonary embolism (HCC) Acute pulmonary embolism ([place])        ICD-10: Treatment Diagnosis:    ?? Generalized Muscle Weakness (M62.81)  ?? Other abnormalities of gait and mobility (R26.89)   Precaution/Allergies:  Adhesive tape-silicones      ASSESSMENT:     Katelyn [name] presents supine in bed in CCU with multiple family members at bedside.  Patient admitted with acute pulmonary embolism, but has Katelyn [name] history of metastatic cancer as well.  She is going home with hospice today and her husband wants to see if she can walk, since she was able to ambulate on Saturday prior to admission. Today she is able to state her name but not place.  She is unable to tell me who her husband is.  Patient appears confused and attempting to take off her gown and fidgeting with IV's.  Supine to sit required max Ax2.  When instructed to sit up, she moved her LE's slightly towards EOB, then stopped and stared off into space.  Sitting balance poor with posterior trunk lean.  Patient unable to follow commands for MMT.  Attempted standing, but required max to total assist of 2, patient leaning posteriorly.  Stood again so RN could place clean chuck pad under patient.  After this patient became tearful so spouse wanted to try again.  Stood Katelyn [name] third time and again it required max Ax2 with patient leaning posteriorly and wide BOS, unable to follow commands to increase trunk extension/upright posture.  SpO2 remained >90%  on nasal cannula.  Patient has experienced Katelyn [name] significant decline in functional mobility recently but her cognition and decreased safety awareness would make PT difficult as patient has decreased command following.  Patient to go home with hospice today and would benefit from hospital bed.  Spouse and family now aware of patient's need for assistance with bed mobility and inability to stand.  No further skilled PT warranted.    This section established at most recent assessment   PROBLEM LIST (Impairments causing functional limitations):  1. Decreased Strength  2. Decreased ADL/Functional Activities  3. Decreased Transfer Abilities  4. Decreased Ambulation Ability/Technique  5. Decreased Activity Tolerance  6. Decreased Cognition   INTERVENTIONS PLANNED: (Benefits and precautions of physical therapy have been discussed with the patient.)  1. none       Rehabilitation Potential For Stated Goals: Poor     RECOMMENDED REHABILITATION/EQUIPMENT: (at time of discharge pending progress): Due to the probability of continued deficits (see above) this patient will not likely need continued skilled physical therapy after discharge.  Equipment:   ? per hospice recommendations              HISTORY:   History of Present Injury/Illness (Reason for Referral):  Per MD note, "61 yo WF with hx of stage 4 melanoma (Brain, lung, liver) admitted with sz and pulmonary embolism. Some GI bleeding on heparin."  Past Medical History/Comorbidities:   Katelyn [name]  has Favor Hackler past medical history of Cancer (Katelyn [name]); Hypercholesterolemia; Hypertension; Nausea &  vomiting; and Osteopenia. She also has no past medical history of Adverse effect of anesthesia; Difficult intubation; Malignant hyperthermia due to anesthesia; or Pseudocholinesterase deficiency.  Katelyn [name]  has Bostyn Kunkler past surgical history that includes hx colonoscopy; hx gyn; hx other surgical; and hx orthopaedic (Left, 04/30/2016).  Social History/Living Environment:    Home Environment: Private residence  # Steps to Enter: 5  One/Two Story Residence: Two Actuary of Interior Steps: 16  Living Alone: No  Support Systems: Child(ren), Social worker / faith community, Family member(s), Friends \\ neighbors, Copy  Patient Expects to be Discharged to:: Private residence  Current DME Used/Available at Home: None  Prior Level of Function/Work/Activity:  Living at home with spouse, ambulating with SBA per family.      Number of Personal Factors/Comorbidities that affect the Plan of Care: 3+: HIGH COMPLEXITY   EXAMINATION:   Most Recent Physical Functioning:   Gross Assessment:  PROM: Generally decreased, functional  Strength: Grossly decreased, non-functional               Posture:  Posture (WDL): Exceptions to WDL  Posture Assessment: Forward head, Rounded shoulders  Balance:  Sitting: Impaired  Sitting - Static: Poor (constant support)  Sitting - Dynamic: Poor (constant support) Bed Mobility:  Supine to Sit: Maximum assistance;Assist x2  Sit to Supine: Maximum assistance;Assist x2  Scooting: Total assistance  Wheelchair Mobility:     Transfers:  Sit to Stand: Maximum assistance;Assist x2  Stand to Sit: Maximum assistance;Assist x2  Gait:            Body Structures Involved:  1. Voice/Speech  2. Lungs  3. Digestive Structures  4. Muscles Body Functions Affected:  1. Mental  2. Respiratory  3. Neuromusculoskeletal  4. Movement Related  5. Metobolic/Endocrine Activities and Participation Affected:  1. Learning and Applying Knowledge  2. General Tasks and Demands  3. Communication  4. Mobility  5. Self Care  6. Domestic Life   Number of elements that affect the Plan of Care: 4+: HIGH COMPLEXITY   CLINICAL PRESENTATION:   Presentation: Evolving clinical presentation with unstable and unpredictable characteristics: HIGH COMPLEXITY   CLINICAL DECISION MAKING:   [facility]	[place] AM-PAC??? ???6 Clicks???   Basic Mobility Inpatient Short Form   How much difficulty does the patient currently have... Unable Khristine Verno Lot Terrell Ostrand Little None   1.  Turning over in bed (including adjusting bedclothes, sheets and blankets)?   []  1   [x]  2   []  3   []  4   2.  Sitting down on and standing up from Theophil Thivierge chair with arms ( e.g., wheelchair, bedside commode, etc.)   []  1   [x]  2   []  3   []  4   3.  Moving from lying on back to sitting on the side of the bed?   []  1   [x]  2   []  3   []  4   How much help from another person does the patient currently need... Total Raven Furnas Lot Zayin Valadez Little None   4.  Moving to and from Mylon Mabey bed to Wynetta Seith chair (including Kyeshia Zinn wheelchair)?   [x]  1   []  2   []  3   []  4   5.  Need to walk in hospital room?   [x]  1   []  2   []  3   []  4   6.  Climbing 3-5 steps with Keora Eccleston railing?   [x]  1   []  2   []   3   []  4   ?? 2007, Trustees of [employer], under license to [place]. All rights reserved      Score:  Initial: 9 Most Recent: X (Date: -- )    Interpretation of Tool:  Represents activities that are increasingly more difficult (i.e. Bed mobility, Transfers, Gait).   Score 24 23 22-20 19-15 14-10 9-7 6     Modifier CH CI CJ CK CL CM CN      ? Mobility - Walking and Moving Around:    [phone] - CURRENT STATUS: CM - 80%-99% impaired, limited or restricted   S0109 - GOAL STATUS: CM - 80%-99% impaired, limited or restricted   N2355 - D/C STATUS:  CM - 80%-99% impaired, limited or restricted  Payor: Theme park manager / Plan: [place] HMO / Product Type: HMO /      ??    Use of outcome tool(s) and clinical judgement create Janelys Glassner POC that gives Timmya Blazier: Questionable prediction of patient's progress: MODERATE COMPLEXITY            TREATMENT:   (In addition to Assessment/Re-Assessment sessions the following treatments were rendered)   Pre-treatment Symptoms/Complaints:    Pain: Initial:   Pain Intensity 1: 0  Post Session:  0     Assessment/Reassessment only, no treatment provided today    Braces/Orthotics/Lines/Etc:   ?? IV  ?? foley catheter  ?? O2 Device: Nasal cannula   Treatment/Session Assessment:    ?? Response to Treatment:  Tearful, appears frustrated.  ?? Interdisciplinary Collaboration:   o Physical Therapist  o Registered Nurse  ?? After treatment position/precautions:   o Supine in bed  o Bed/Chair-wheels locked  o Family at bedside  o Nurse at bedside   ??   Total Treatment Duration:  PT Patient Time In/Time Out  Time In: 1120  Time Out: [place], PT, DPT

## 2017-04-20 NOTE — Progress Notes (Signed)
DNR order signed by patient's husband and MD.  Transport set up with [facility] for 2:45pm.  Gwinda Passe [facility]) aware.

## 2017-04-20 NOTE — Progress Notes (Addendum)
[facility] Hematology & Oncology        Inpatient Hematology / Oncology Progress Note      Admission Date: 04/18/2017  7:07 AM  Reason for Admission/Hospital Course: Extensive Bilateral Pulmonary Emboli  Melanoma  Metastatic Cancer  Seizures  Acute pulmonary embolism (HCC)      24 Hour Events:  No fevers  Responding to voice  Multiple family members present  DNR  Hospice plans for discharge this afternoon    ROS:   Unable to assess     Allergies   Allergen Reactions   ??? Adhesive Tape-Silicones Rash       OBJECTIVE:  Patient Vitals for the past 8 hrs:   BP Pulse Resp SpO2   04/20/17 0801 135/83 84 15 92 %   04/20/17 0701 144/80 (!) 106 23 90 %   04/20/17 0601 132/71 68 15 93 %   04/20/17 0554 (!) 189/93 (!) 53 - -   04/20/17 0552 (!) 189/93 68 23 96 %   04/20/17 0300 - (!) 58 20 96 %   04/20/17 0200 - (!) 56 18 96 %   04/20/17 0100 - 63 18 97 %     Temp (24hrs), Avg:98.7 ??F (37.1 ??C), Min:98.6 ??F (37 ??C), Max:98.7 ??F (37.1 ??C)         Physical Exam:  Constitutional: Well nourished appearing female in no acute distress, lying in hospital bed   HEENT: Normocephalic and atraumatic. Oropharynx is clear, mucous membranes are moist.    Lymph node   Deferred   Skin Warm and dry.  No bruising and no rash noted.  No erythema.  No pallor.    Respiratory Lungs are clear     CVS Normal rate, regular rhythm    Abdomen Soft, nontender and nondistended, normoactive bowel sounds.      Neuro Responding appropriately    MSK   No edema and no tenderness.       Labs:      Recent Labs      04/20/17   0350  04/19/17   0932  04/19/17   0455  04/18/17   0403   WBC  6.8   --   6.6  10.1   RBC  4.08   --   4.07  4.25   HGB  12.4   --   12.0  12.8   HCT  36.9   --   37.2  37.5   MCV  90.4   --   91.4  88.2   MCH  30.4   --   29.5  30.1   MCHC  33.6   --   32.3  34.1   RDW  17.6*   --   17.8*  17.1*   PLT  110*  112*  107*  128*   GRANS  86*   --   83*  82*   LYMPH  12*   --   13  12*   MONOS  2*   --   3*  5   EOS  0*   --   0*  0*    BASOS  0   --   0  0   IG  0   --   1  1   DF  AUTOMATED   --   AUTOMATED  AUTOMATED   ANEU  5.8   --   5.6  8.2   ABL  0.8   --   0.9  1.2  ABM  0.2   --   0.2  0.5   ABE  0.0   --   0.0  0.0   ABB  0.0   --   0.0  0.0   AIG  0.0   --   0.0  0.1        Recent Labs      04/19/17   0932  04/19/17   0455  04/18/17   2148  04/18/17   0403  04/17/17   1225   NA   --   144   --   143  141   K   --   4.1  4.1  2.5*  3.6   CL   --   110*   --   107  99   CO2   --   25   --   29  21   AGAP   --   9   --   7  21*   GLU   --   136*   --   104*  384*   BUN   --   15   --   15  20   CREA   --   0.57*   --   0.62  1.51*   GFRAA   --   >60   --   >60  45*   GFRNA   --   >60   --   >60  37*   CA   --   7.7*   --   7.0*  9.1   SGOT  53*   --    --    --   31   AP  77   --    --    --   90   TP  5.3*   --    --    --   6.7   ALB  2.2*   --    --   2.2*  2.9*   GLOB  3.1   --    --    --   3.8*   AGRAT  0.7*   --    --    --   0.8*   MG   --    --    --   2.0  2.5*   PHOS   --    --    --   2.9   --      Imaging:  DUPLEX LOWER EXT VENOUS BILAT [[id number]] Collected: 04/17/17 2348    ?? Order Status: Completed Updated: 04/17/17 2350   ?? Narrative: ??   ?? BILATERAL LOWER EXTREMITY VENOUS DUPLEX ULTRASOUND.    HISTORY: ??Pain.    TECHNIQUE: Direct skin-contact high resolution grayscale images, color-flow  Doppler and duplex waveform analysis.    FINDINGS: There is compressibility, color-flow assignment and augmentation of  the venous waveform with calf compression in the right common femoral,  superficial femoral and popliteal veins. There is evidence of thrombus in the  left calf veins. The left deep venous system is otherwise normal.     ?? Impression: ??   ?? IMPRESSION: Acute left calf vein deep vein thrombosis.     ?? XR ABD (KUB) [[id number]] Collected: 04/17/17 2119   ?? Order Status: Completed Updated: 04/17/17 2121   ?? Narrative: ??   ?? Abdomen x-ray single view.    HISTORY: Dobbhoff tube placement.    COMPARISON: None.  FINDINGS: Dobbhoff tube its tip in the gastric body. Bowel gas pattern is  nonspecific.     ?? Impression: ??   ?? IMPRESSION:    Dobbhoff tube is tip in the gastric body.         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 [facility]) (Chronic) ICD-10-CM: C78.7  ICD-9-CM: 197.7, 172.9  04/17/2017        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 [facility]) (Chronic) ICD-10-CM: C78.00  ICD-9-CM: 197.0, 172.9  04/17/2017        AKI (acute kidney injury) ([place]) ICD-10-CM: N17.9  ICD-9-CM: 584.9  04/17/2017        Demand ischemia ([place]) ICD-10-CM: I24.8  ICD-9-CM: 411.89  04/17/2017        Brain edema (HCC) (Chronic) ICD-10-CM: G93.6  ICD-9-CM: 348.5  04/17/2017        Acute respiratory failure ([place]) ICD-10-CM: J96.00  ICD-9-CM: 518.81  04/17/2017        SIRS (systemic inflammatory response syndrome) ([place]) ICD-10-CM: R65.10  ICD-9-CM: 995.90  04/17/2017        * ([place])Acute pulmonary embolism ([place]) ICD-10-CM: I26.99  ICD-9-CM: 415.19  04/17/2017        Shock ([place]) ICD-10-CM: R57.9  ICD-9-CM: 785.50  04/17/2017        Lactic acidosis ICD-10-CM: E87.2  ICD-9-CM: 276.2  04/17/2017        Aphasia (Chronic) ICD-10-CM: R47.01  ICD-9-CM: 784.3  02/28/2017        Memory difficulties ICD-10-CM: R41.3  ICD-9-CM: 780.93  11/20/2016        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[place]) ICD-10-CM: I61.9  ICD-9-CM: 161  11/19/2016        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  She has had intermittent episodes, almost like a TIA in presentation, that have resolved after 36-48 hours.  None of these had really been associated with seizure-like activity until now, but this should certainly be considered as a possible etiology - now on Keppra and Neurology consulted.  PE noted on CT, agree with UFH especially given GI bleed noted on admission, Hgb stable now and GI following.  Transition to oral anticoagulation once stable.  Monitor petechial rash, her platelets are adequate on CBC today.  HTN per hospitalists, on Levophed  briefly but now running high, PRN hydralazine and metoprolol available.  Hold Taf/Mek until she passes swallow eval, then resume.     4/30 Multiple family members (4 children, son in law and husband) at bedside. They have been discussing the notion of comfort care and hospice with the hospitalist and ICU teams. Reviewed scans and Dr. Faith Rogue discussed case with Drs Maceo Pro and Jenetta Loges. 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 CT head seems to show improvement in mass effect on admission. ??Tele-neurology was consulted and recommended increasing keppra and adding depakote BID for seizures. They have also suggested and MRI and follow up with local neurologist. Family has stated they have watched her decline over the last 6 months slowly and we discussed the notion of prolonging suffering or giving her meaningful life.  Family was  given options to pursue hospice from today vs increasing seizure meds and reimaging the brain/ adjusting management strategies over the next couple of days then addressing goals of care. She is a DNR now which is appropriate. Palliative care was present for conversation. Will ask nursing to give a dose of ativan and follow up with family    04/20/2017 Plan: family has decided on Hospice support. We will be available as needed.             Lannie Fields, NP   [facility]ematology [facility] On[facility]  [address] [place] [address]  Office : [phone]  Fax : [phone]             Attending Addendum:  I personally evaluated the patient with Lannie Fields N.P.,  and agree with the assessment, findings and plan as documented.  Appears stable, heart regular without murmur, lungs clear, abdomen benign.  Appears comfortable. Family has decided to proceed w hospice care. Various questions answered.               Letha Cape, MD  [facility]  [facility]  [address] [place] [address]  Office : [phone]  Fax : [phone]

## 2017-04-20 NOTE — Progress Notes (Signed)
Speech language pathology: bedside swallow note: Initial Assessment and Discharge    NAME/AGE/GENDER: Katelyn [name] is a 61 y.o. female  DATE: 04/20/2017  PRIMARY DIAGNOSIS: Extensive Bilateral Pulmonary Emboli  Melanoma  Metastatic Cancer  Seizures  Acute pulmonary embolism ([place])       ICD-10: Treatment Diagnosis: R13.12 Oropharyngeal Dysphagia.    INTERDISCIPLINARY COLLABORATION: Registered Nurse  PRECAUTIONS/ALLERGIES: Adhesive tape-silicones ASSESSMENT:Based on the objective data described below, Katelyn [name] presents with mild oropharyngeal dysphagia. Patient seen at the request of Katelyn Morales, NP- Palliative care. Current plans for patient to discharge home with hospice. Medical team requesting recommendations for safest po option to reduce risk of aspiration with continued po intake. Per report, patient coughing on ice chips yesterday. She was alert during assessment with daughter at bedside. Impaired command following, thus limiting oral motor assessment; however, no gross asymmetry noted.   Patient was presented thin liquid via tsp, cup, and serial straw sips. Delayed swallow initiation palpated. Mild throat clear following 1/3 trials via cup sip and 1/6 straw sips. No change in vocal quality or cough noted. Prolonged oral holding and reduced mastication with puree and mechanical soft trials. Mild oral residue noted after the swallow, which cleared with liquid wash.   Recommend mechanical soft diet- ground meats- and thin liquids. Medications crushed in puree. Discussed recommendations with daughter at bedside. Reviewed strategies to assist with safety of po intake including small sips/bites, checking oral cavity for residue, and using liquid wash to clear residue. They expressed understanding of recommendations. ????????This section established at most recent assessment??????????  PROBLEM LIST (Impairments causing functional limitations):  1. Oropharyngeal dysphagia   REHABILITATION POTENTIAL FOR STATED GOALS: Poor  PLAN OF CARE:   Patient will benefit from skilled intervention to address the following impairments.  RECOMMENDATIONS AND PLANNED INTERVENTIONS (Benefits and precautions of therapy have been discussed with the patient.):  ?? PO:  Mechanical soft with ground meat/chopped vegatables  ?? Liquids:  regular thin  MEDICATIONS:  ?? Crushed in puree  COMPENSATORY STRATEGIES/MODIFICATIONS INCLUDING:  ?? Small sips and bites  OTHER RECOMMENDATIONS (including follow up treatment recommendations):   ?? none  RECOMMENDED DIET MODIFICATIONS DISCUSSED WITH:  ?? Medical Sub-Specialist  ?? Nursing  ?? Family  ?? Patient  FREQUENCY/DURATION:No additional speech therapy indicated at this time. Marland KitchenRECOMMENDED REHABILITATION/EQUIPMENT: (at time of discharge pending progress): Due to the probability of continued deficits (see above) this patient will not likely need continued skilled speech therapy after discharge.  SUBJECTIVE:   Alert, but confused. Daughter at bedside. Plan for patient to d/c to home hospice.   History of Present Injury/Illness: Katelyn [name]  has a past medical history of Cancer ([place]); Hypercholesterolemia; Hypertension; Nausea & vomiting; and Osteopenia. She also has no past medical history of Adverse effect of anesthesia; Difficult intubation; Malignant hyperthermia due to anesthesia; or Pseudocholinesterase deficiency..  She also  has a past surgical history that includes hx colonoscopy; hx gyn; hx other surgical; and hx orthopaedic (Left, 04/30/2016).   Present Symptoms: mild oropharyngeal dysphagia as described above   Pain Intensity 1: 0  Pain Location 1: Knee  Pain Orientation 1: Left  Pain Intervention(s) 1: Medication (see MAR)  Current Medications:   No current facility-administered medications on file prior to encounter.      Current Outpatient Prescriptions on File Prior to Encounter   Medication Sig Dispense Refill    ??? spironolactone (ALDACTONE) 25 mg tablet Take 1 Tab by mouth daily. 30 Tab 6   ??? traMADol (ULTRAM) 50 mg  tablet Take 1 Tab by mouth every six (6) hours as needed for Pain. Max Daily Amount: 200 mg. Indications: Pain 60 Tab 0   ??? dabrafenib (TAFINLAR) 75 mg cap Take 2 Caps by mouth two (2) times a day. 120 Cap 3   ??? trametinib (MEKINIST) 2 mg tab Take 1 Tab by mouth daily. 30 Tab 3   ??? dexamethasone (DECADRON) 4 mg tablet Take 4 mg by mouth four (4) times daily.     ??? OTHER,NON-FORMULARY, Handicap Placard 1 Each 0   ??? ibuprofen (MOTRIN) 200 mg tablet Take 200 mg by mouth every six (6) hours as needed for Pain.     ??? losartan (COZAAR) 100 mg tablet Take 100 mg by mouth.     ??? acetaminophen (TYLENOL) 325 mg tablet Take 650 mg by mouth every six (6) hours as needed.     ??? levETIRAcetam (KEPPRA) 500 mg tablet Take 1 Tab by mouth two (2) times a day. 60 Tab 11   ??? LORazepam (ATIVAN) 1 mg tablet Take 1 Tab by mouth nightly as needed (insomnia). Max Daily Amount: 1 mg. 30 Tab 0   ??? simvastatin (ZOCOR) 20 mg tablet Take 1 Tab by mouth nightly. 90 Tab 3   ??? amLODIPine (NORVASC) 10 mg tablet Take 1 Tab by mouth nightly. 90 Tab 3   ??? fluticasone (FLONASE) 50 mcg/actuation nasal spray 2 Sprays by Both Nostrils route nightly as needed.     ??? cetirizine (ZYRTEC) 10 mg tablet Take  by mouth daily as needed.       Current Dietary Status:  Mechanical soft/thin liquids      ?   Social History/Home Situation:   Home Environment: Private residence  # Steps to Enter: 5  One/Two Story Residence: Two [place]uary of [place] Steps: 16  Living Alone: No  Support Systems: Child(ren), Social worker / faith community, Family member(s), Friends \\ neighbors, Copy  Patient Expects to be Discharged to:: Other (comment) (possibly home with hospice)  Current DME Used/Available at Home: None  OBJECTIVE:   Respiratory Status:        CXR Results:Stable pontine and supratentorial microischemia, interval increase   and decrease in some of the intracranial lesions as above with the most  pronounced change being the increase in size and mass effect of the left  periatrial tumor.  MRI/CT Results: MILD RIGHT UPPER LOBE AND LEFT BASILAR ATELECTASIS/INFILTRATE.  Oral Motor Structure/Speech:  Oral-Motor Structure/Motor Speech  Labial: No impairment  Dentition: Intact  Lingual: No impairment    Cognitive and Communication Status:  Neurologic State: Alert;Confused  Orientation Level: Oriented to person  Cognition: Decreased attention/concentration;Decreased command following  Perception: Appears intact  Perseveration: No perseveration noted  Safety/Judgement: Fall prevention;Decreased insight into deficits    BEDSIDE SWALLOW EVALUATION  Oral Assessment:  Oral Assessment  Labial: No impairment  Dentition: Intact  Lingual: No impairment  P.O. Trials:  Patient Position: Upright in bed    The patient was given tsp-small bite amounts of the following:   Consistency Presented: Thin liquid;Puree;Mixed consistency  How Presented: SLP-fed/presented;Cup/sip;Spoon;Straw;Successive swallows    ORAL PHASE:  Bolus Acceptance: No impairment  Bolus Formation/Control: Impaired  Propulsion: Delayed (# of seconds)  Type of Impairment: Mastication  Oral Residue: 10-50% of bolus    PHARYNGEAL PHASE:  Initiation of Swallow: Delayed (# of seconds)  Laryngeal Elevation: Functional  Aspiration Signs/Symptoms: Clear throat  Vocal Quality: No impairment           Pharyngeal Phase  Characteristics: Poor endurance    OTHER OBSERVATIONS:  Rate/bite size: Questionable   Endurance:  Questionable   Comments:      Tool Used: Dysphagia Outcome and Severity Scale (DOSS)    Score Comments   Normal Diet  []  7 With no strategies or extra time needed   Functional Swallow  []  6 May have mild oral or pharyngeal delay       Mild Dysphagia    [x]  5 Which may require one diet consistency restricted (those who demonstrate penetration which is entirely cleared on MBS would be  included)   Mild-Moderate Dysphagia  []  4 With 1-2 diet consistencies restricted       Moderate Dysphagia  []  3 With 2 or more diet consistencies restricted       Moderately Severe Dysphagia  []  2 With partial PO strategies (trials with ST only)       Severe Dysphagia  []  1 With inability to tolerate any PO safely          Score:  Initial: 5 Most Recent: X (Date: -- )   Interpretation of Tool: The Dysphagia Outcome and Severity Scale (DOSS) is a simple, easy-to-use, 7-point scale developed to systematically rate the functional severity of dysphagia based on objective assessment and make recommendations for diet level, independence level, and type of nutrition.     Score 7 6 5 4 3 2 1    Modifier CH CI CJ CK CL CM CN   ? Swallowing:    O8416 - CURRENT STATUS: CJ - 20%-39% impaired, limited or restricted   S0630 - GOAL STATUS:  CJ - 20%-39% impaired, limited or restricted   Z6010 - D/C STATUS:  CJ - 20%-39% impaired, limited or restricted  Payor: Theme park manager / Plan: [place] / Product Type: HMO /     TREATMENT:    (In addition to Assessment/Re-Assessment sessions the following treatments were rendered)  Assessment/Reassessment only, no treatment provided today  MODALITIES:                                                                    ORAL MOTOR  EXERCISES:                                                                                                                                                                      LARYNGEAL / PHARYNGEAL EXERCISES:  __________________________________________________________________________________________________  Safety:   After treatment position/precautions:  ?? Upright in Bed.  Recommendations/Intent for next treatment session:  No further speech therapy indicated at this time.   Total Treatment Duration:  Time In: 0859   Time Out: 0920    Delight Stare, MSP, CCC-SLP, CBIS

## 2017-04-20 NOTE — Discharge Summary (Deleted)
Hospitalist Discharge Summary     Patient ID:  Katelyn [name]  [id number]  60 y.o.  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, lungs and brain  Acute encephalopathy due to seizures  Thrombocytopenia   GI bleed      Principal Problem:    Acute pulmonary embolism ([place]) (04/17/2017)    Active Problems:    Secondary malignant melanoma of brain ([place]) (04/17/2017)      Seizure ([place]) (04/17/2017)      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 ([place]) (04/17/2017)      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 In summary, the patient is 61 years old female with pmhx of melanoma with metastatic disease to liver, lung, and brain with brain edema/seizures admitted on 4/28 for acute convulsive seizures with bilateral pulmonary emboli, left acute LE DVT and hemodynamic instability. Pt was admitted on SFE and transferred to [facility] ICU for further care. Pt was also noted to have GI bleeding, for which GI was consulted.   For first 24 hours, pt was started on IV heparin drip with IV keppra and depakene for seizure. Tele neuro thought that pt was in partial status epilepticus. Initially pt was very tremulous but after starting IV depakene, tremors resolved. Pt's confusion and encephalopathy hasn't  significantly changed.   I have met with patient's family, according to them, they felt that they were not warned about potential complications of pt's cancer, and all these new medical issues are sudden and quite overwhelming.  I explained to them that it's always very difficult to have specific timeline for when the complications would happen, but stage 4 cancer with brain mets has a very poor diagnosis to start with. Blood clots are common complications in pt's with malignancy and pt's current worsening of seizure is due to multiple brain mets.  Also, continuing blood thinners will always impose risk of life threatening bleeding in the brain or elsewhere in the body, specially GI tract which is also one of the active issues with the patient.  Due extensive complications and rapid decline in the pt's overall health, hospice and comfort care options were discussed with the family. Off note, pt's daughter mentioned that they were working with [facility] hosp[facility] for past 2 months.  Patient's family agreed with making Ms. Katelyn [name] comfortable and not pursuing any invasive/aggressive measures. Pt was made DNR/DNI on 4/30. Family is interested in taking pt home with hospice care.  I have stopped all the medications which will not improve quality of life. Anti seizure medications and prednisone should be continued to minimize further seizure episodes. It's quite possible that pt may have further seizure episodes due to  progressive brain disease, in such case, prn ativan or versed can be used based on hospice services.  I have allowed for pleasure food.  Rest of the hospital course was uneventful.    Significant Diagnostic Studies:   BILATERAL LOWER EXTREMITY VENOUS DUPLEX ULTRASOUND.  ??  HISTORY:  Pain.  ??  TECHNIQUE: Direct skin-contact high resolution grayscale images, color-flow  Doppler and duplex waveform analysis.  ??  FINDINGS: There is compressibility, color-flow assignment and augmentation of   the venous waveform with calf compression in the right common femoral,  superficial femoral and popliteal veins. There is evidence of thrombus in the  left calf veins. The left deep venous system is otherwise normal.  ??  IMPRESSION  IMPRESSION: Acute left calf vein deep vein thrombosis.  ??  Abdomen x-ray single view.  ??  HISTORY: Dobbhoff tube placement.  ??  COMPARISON: None.  ??  FINDINGS: Dobbhoff tube its tip in the gastric body. Bowel gas pattern is  nonspecific.  ??  IMPRESSION  IMPRESSION:  ??  Dobbhoff tube is tip in the gastric body.    CT chest w contrast:  IMPRESSION:    1.  EXTENSIVE BILATERAL PULMONARY EMBOLI.  2.  INTERVAL IMPROVEMENT IN PULMONARY PARENCHYMAL AND HEPATIC METASTATIC DISEASE  SINCE OCTOBER 2017."  Labs: Results:       Chemistry Recent Labs      04/19/17   0932  04/19/17   0455  04/18/17   2148  04/18/17   0403  04/17/17   1225   GLU   --   136*   --   104*  384*   NA   --   144   --   143  141   K   --   4.1  4.1  2.5*  3.6   CL   --   110*   --   107  99   CO2   --   25   --   29  21   BUN   --   15   --   15  20   CREA   --   0.57*   --   0.62  1.51*   CA   --   7.7*   --   7.0*  9.1   AGAP   --   9   --   7  21*   AP  77   --    --    --   90   TP  5.3*   --    --    --   6.7   ALB  2.2*   --    --   2.2*  2.9*   GLOB  3.1   --    --    --   3.8*   AGRAT  0.7*   --    --    --   0.8*      CBC w/Diff Recent Labs      04/20/17   0350  04/19/17   0932  04/19/17   0455  04/18/17   0403   WBC  6.8   --   6.6  10.1   RBC  4.08   --   4.07  4.25   HGB  12.4   --   12.0  12.8   HCT  36.9   --   37.2  37.5   PLT  110*  112*  107*  128*   GRANS  86*   --   83*  82*   LYMPH  12*   --   13  12*   EOS  0*   --   0*  0*      Cardiac Enzymes Recent Labs      04/17/17   1625   CPK  83      Coagulation Recent Labs      04/20/17   0350  04/19/17   1722   04/19/17   0932   04/17/17   1225   PTP   --    --    --   12.5   --   12.8   INR   --    --    --   1.0   --   1.0    APTT  63.7*  64.0*   < >  39.7*   < >   --     < > = values in this interval not displayed.       Lipid Panel Lab Results   Component Value Date/Time    Cholesterol, total 162 10/14/2016 09:48 AM    HDL Cholesterol 63 10/14/2016 09:48 AM    LDL, calculated 91 10/14/2016 09:48 AM    VLDL, calculated 8 10/14/2016 09:48 AM    Triglyceride 42 10/14/2016 09:48 AM      BNP No results for input(s): BNPP in the last 72 hours.   Liver Enzymes Recent Labs      04/19/17   0932   TP  5.3*   ALB  2.2*   AP  77   SGOT  53*      Thyroid Studies Lab Results   Component Value Date/Time    TSH 3.250 08/27/2015 08:08 AM            Discharge Exam:  Visit Vitals   ??? BP (!) 155/97   ??? Pulse 87   ??? Temp 97.6 ??F (36.4 ??C)   ??? Resp 22   ??? Wt 68.6 kg (151 lb 3.8 oz)   ??? SpO2 92%   ??? Breastfeeding No   ??? BMI 23.69 kg/m2     General:                    NAD, pleasantly confused  HEENT:           Head NCAT, PERRLA+, no pallor or ict  Lungs:                                 CTA Bilaterally.   CVS:                                    Tachycardic, regular rhythm,??No MRG, No lower extremity edema.  Abdomen:                  Soft, Non distended, Non tender, Positive bowel sounds.  MSK:                                   No deformities, lesions, Spontaneously moves extremities.  Neurologic:??               GCS 15, following commands intermittently  Psychiatry:  Orientation and mood could not be assessed  Skin:                                    No rash/lesions. Good skin turgor  Heme/Lymph/Immune:  Has petechiae in UE, no ecchymoses    Disposition: home hospice  Discharge Condition: stable  Patient Instructions:   Current Discharge Medication List          Activity: Bedrest  Diet: Comfort feeding  Wound Care: None needed    Follow-up  ??   Follow up with hospice physician at home.  Time spent to discharge patient 35 minutes  Signed:  Myrtie Neither, MD  04/20/2017  12:21 PM

## 2017-04-20 NOTE — Progress Notes (Addendum)
Palliative Care Progress Note    Patient: Katelyn [name] MRN: [id number]  SSN: [id number]    Date of Birth: Jul 09, 1956  Age: 61 y.o.  Sex: female       Assessment/Plan:     Chief Complaint/Interval History: Slightly more alert during my visit today     Principal Diagnosis:    ?? Altered Mental Status R41.82    Additional Diagnoses:   ?? Debility, Unspecified  R53.81  ?? Dysphagia  R13.10  ?? Seizures, Convulsions  R56.9  ?? Counseling, Encounter for Medical Advice  Z71.9  ?? Encounter for Palliative Care  Z51.5    Palliative Performance Scale (PPS)  PPS: 30    Medical Decision Making:   Reviewed and summarized notes over previous 24 hours.  Discussed case with appropriate providers: ICU IDT; Almyra Free, primary RN; Larene Beach, SLP  Reviewed laboratory and x-ray data: CBC    Patient resting in bed, slightly more alert during my visit today.  Patient listening to friend on speaker phone, able to laugh, say "I love you" with weak voice. Husband and two daughters are at the bedside. They have decided to go home with [facility].  Long discussion about hospice philosophy, what is included and what is not.  Counseled them on patient's risk of aspiration and aspiration pneumonia.  Counseled on signs and symptoms of these, and encouraged them to contact [facility] with any changes once at home.  They request SLP and PT evaluations today for safe recommendations for home.  Pending SLP assessment, will discuss current medications with [facility] to identify what they have on formulary and change orders accordingly.  Equipment is being delivered by [facility] today between 1 and 2.  Will continue to follow.  [place] with Psychologist, educational from [employer].  They have approved Decadron, Depakote, and Keppra.  They have also approved a Lovenox bridge with Coumadin to start for DVT and PEs.  Dr. Andria Frames aware and will provide 2 week prescription for Lovenox.  Discharge home with  [facility] later today.  Thank you for the opportunity to participate in Ms. Facemire's care.    Will discuss findings with members of the interdisciplinary team.         More than 50% of this 35 minute visit was spent counseling and coordination of care as outlined above.    Subjective:     Review of Systems:  A comprehensive review of systems was negative.     Objective:     Visit Vitals   ??? BP 135/83   ??? Pulse 84   ??? Temp 98.7 ??F (37.1 ??C)   ??? Resp 15   ??? Wt 68.6 kg (151 lb 3.8 oz)   ??? SpO2 92%   ??? Breastfeeding No   ??? BMI 23.69 kg/m2       Physical Exam:    General:  Frail and debilitated appearing. No acute distress.   Eyes:  Conjunctivae/corneas clear.    Nose: Nares normal. Septum midline. O2 via NC.   Neck: Supple, symmetrical, trachea midline.   Lungs:   Unlabored.   Heart:  Regular rate and rhythm.   Abdomen:   Soft, non-tender, non-distended.   Extremities: Normal, atraumatic, no cyanosis or edema.   Skin: Skin color, texture, turgor normal. No rash.   Neurologic: Nonfocal.   Psych: Alert and oriented to person.     Signed By: Renne Crigler, NP     Apr 20, 2017

## 2017-04-20 NOTE — Progress Notes (Signed)
PT Note:  Pt/family pursuing hospice post discharge.  Will remove pt from PT caseload at this time.  Thanks,  Janyth Pupa, PT, DPT

## 2017-04-20 NOTE — Progress Notes (Signed)
Spoke with Lysle Morales NP (Palliative Care) / she will call when patient ready to transport home with [facility] and CM will set up transport.

## 2017-04-20 NOTE — Other (Signed)
Interdisciplinary team rounds were held 04/20/2017 with the following team members:Care Management, Nursing, Nurse Practitioner, Nutrition, Palliative Care, Pastoral Care, Pharmacy, Physical Therapy, Physician, Respiratory Therapy, Speech Therapy and Clinical Coordinator and the patient, spouse and child(ren).    Plan of care discussed. See clinical pathway and/or care plan for interventions and desired outcomes.

## 2017-04-20 NOTE — Progress Notes (Signed)
[place] on husband's cell.

## 2017-04-21 LAB — TYPE + CROSSMATCH
ABO/Rh(D): A POS
Antibody screen: NEGATIVE
Unit division: 0
Unit division: 0
Unit division: 0

## 2017-04-21 LAB — SEROTONIN RELEASE ASSAY
Unfrac Heparin High Dose: 3 % (ref 0–20)
Unfrac Heparin Low Dose: 2 % (ref 0–20)

## 2017-04-21 NOTE — Progress Notes (Signed)
Per [facility] patient discharged home with [facility] on 04/20/2017, No Transitions of Care Outreach due to patient discharge disposition currently under home hospice care.This note will not be viewable in [place].

## 2017-04-22 LAB — CULTURE, BLOOD
Culture result:: NO GROWTH
Culture result:: NO GROWTH

## 2017-04-27 ENCOUNTER — Encounter

## 2017-04-27 ENCOUNTER — Encounter: Payer: Self-pay | Admitting: General Surgery

## 2017-04-28 ENCOUNTER — Encounter: Attending: Internal Medicine | Primary: Family Medicine

## 2017-04-28 ENCOUNTER — Encounter: Primary: Family Medicine

## 2017-04-28 ENCOUNTER — Other Ambulatory Visit: Payer: PRIVATE HEALTH INSURANCE | Primary: Family Medicine

## 2017-05-04 ENCOUNTER — Ambulatory Visit: Payer: Self-pay | Admitting: Physician Assistant

## 2017-05-04 ENCOUNTER — Ambulatory Visit: Payer: Self-pay | Admitting: General Surgery

## 2017-05-04 ENCOUNTER — Encounter: Payer: Self-pay | Admitting: Physician Assistant

## 2017-05-04 VITALS — BP 125/70 | HR 80 | Temp 98.5°F | Ht 62.0 in | Wt 242.0 lb

## 2017-05-04 DIAGNOSIS — Z0000 Encounter for general adult medical examination without abnormal findings: Secondary | ICD-10-CM

## 2017-05-04 DIAGNOSIS — M1710 Unilateral primary osteoarthritis, unspecified knee: Secondary | ICD-10-CM | POA: Insufficient documentation

## 2017-05-04 MED ORDER — VITAMIN D (ERGOCALCIFEROL) 1.25 MG (50000 UNIT) PO CAPS: 50000.0000 [IU] | ORAL_CAPSULE | ORAL | 3 refills | Status: DC

## 2017-05-04 MED ORDER — SERTRALINE HCL 100 MG PO TABS: 100.0000 mg | ORAL_TABLET | Freq: Every day | ORAL | 3 refills | Status: DC

## 2017-05-04 NOTE — Progress Notes (Signed)
S: pt here for wellness physical had biometrics for insurance purposes done at work, states her cholesterol was a little high but they were not concerned, no complaints ros neg. PMH:  As noted on chart  Social: as noted on chart Fam: as noted on chart  O: vitals wnl, nad, ENT wnl, neck supple no lymph, lungs c t a, cv rrr, abd soft nontender bs normal all 4 quads  A: wellness physical  P: mammogram ordered, pt is scheduled to see dr Lemar Livingsbyrnett for colonoscopy, f/u in August for repeat physical and biometrics for 2019 requirements

## 2017-05-14 ENCOUNTER — Encounter: Payer: PRIVATE HEALTH INSURANCE | Attending: Family Medicine | Primary: Family Medicine

## 2017-05-21 DEATH — deceased

## 2017-05-24 ENCOUNTER — Other Ambulatory Visit: Payer: Managed Care, Other (non HMO)

## 2017-06-14 ENCOUNTER — Inpatient Hospital Stay: Payer: Managed Care, Other (non HMO)

## 2017-06-16 ENCOUNTER — Ambulatory Visit: Payer: Self-pay

## 2017-06-16 ENCOUNTER — Ambulatory Visit: Payer: Self-pay | Admitting: Internal Medicine

## 2017-06-21 ENCOUNTER — Ambulatory Visit: Payer: Self-pay | Admitting: Family

## 2017-06-21 VITALS — BP 140/80 | HR 81 | Temp 98.4°F

## 2017-06-21 DIAGNOSIS — R21 Rash and other nonspecific skin eruption: Secondary | ICD-10-CM

## 2017-06-21 MED ORDER — DEXAMETHASONE SODIUM PHOSPHATE 100 MG/10ML IJ SOLN
10.0000 mg | Freq: Once | INTRAMUSCULAR | Status: AC
  Administered 2017-06-21: 10 mg via INTRAMUSCULAR

## 2017-06-21 NOTE — Progress Notes (Signed)
Ms. [name] is here today for rash. She reports she used a different colone but has also had very low-grade general viral symptoms. Scratchy throat , malaise, feeling chilled and with some loose stools. She woke up today noting this generalized pruritic rash.  O/: Vital signs are stable she is alert pleasant no acute distress rash is not present to her face is predominantly over her chest abdomen and upper legs, mildly erythematous scattered macular and papular areas, no welts  A/dermatitis  possibly viral  P/: OTC Benadryl as directed. Decadron 10 mg IM given in clinic.Note for out of work today.Supportive measures discussed. Follow up prn not improving    l

## 2017-06-24 ENCOUNTER — Ambulatory Visit: Payer: Self-pay | Admitting: Physician Assistant

## 2017-06-24 VITALS — BP 139/90 | HR 82 | Temp 98.5°F | Resp 16

## 2017-06-24 DIAGNOSIS — L259 Unspecified contact dermatitis, unspecified cause: Secondary | ICD-10-CM

## 2017-06-24 MED ORDER — PREDNISONE 10 MG (21) PO TBPK: ORAL_TABLET | ORAL | 0 refills | Status: DC

## 2017-06-24 NOTE — Progress Notes (Signed)
Subjective:    Patient ID: [name], female    DOB: April 11, 1956, 61 y.o.   MRN: [id number]  HPI Patient is a 61 year old female who presents for follow up after visit on 06/21/17. Patient reports that viral symptoms have now all resolved. She denies nausea, vomiting, diarrhea, or fevers.   Patient reports that on 06/22/17 after Decadron injection in office and Benadryl on 06/21/17 at hour of sleep that rash and almost completely resolved. She reports she has not taken any Benadryl since 06/21/17 and that rash has returned.  Rash is on lower abdomen, lower back to bilateral buttocks, and bilateral upper arms.  Reports itching. Denies any warmth or burning.  She denies any tick bites, exposures, or any other symptoms. She does report restarting Zoloft around two weeks ago and states " I had taken Zoloft for years before". Patient reports she feels these symptoms started when she used a new perfume. She has since returned the perfume.   Review of Systems  Constitutional: Negative for activity change, appetite change, chills, diaphoresis, fatigue, fever and unexpected weight change.  HENT: Negative for congestion, nosebleeds, postnasal drip, rhinorrhea, sinus pain, sinus pressure, sneezing, trouble swallowing and voice change.   Eyes: Negative for photophobia, pain, discharge, redness, itching and visual disturbance.  Respiratory: Negative for apnea, cough, choking, chest tightness, shortness of breath, wheezing and stridor.   Cardiovascular: Negative for chest pain, palpitations and leg swelling.  Gastrointestinal: Negative for abdominal distention, abdominal pain, constipation, diarrhea, nausea, rectal pain and vomiting.  Endocrine: Negative for polydipsia, polyphagia and polyuria.  Genitourinary: Negative for decreased urine volume, difficulty urinating, dyspareunia, dysuria, flank pain, urgency, vaginal bleeding, vaginal discharge and vaginal pain.  Musculoskeletal: Negative for arthralgias, back  pain, gait problem, joint swelling, myalgias, neck pain and neck stiffness.  Skin: Positive for rash. Negative for color change, pallor and wound.  Neurological: Negative.   Hematological: Negative.   Psychiatric/Behavioral: Negative for agitation, behavioral problems, confusion, decreased concentration, dysphoric mood, hallucinations, self-injury, sleep disturbance and suicidal ideas. The patient is not nervous/anxious and is not hyperactive.        Objective:   Physical Exam  Constitutional: She is oriented to person, place, and time. She appears well-developed and well-nourished. No distress.  HENT:  Head: Normocephalic and atraumatic.  Right Ear: External ear normal.  Left Ear: External ear normal.  Nose: Nose normal.  Mouth/Throat: Oropharynx is clear and moist. No oropharyngeal exudate.  Eyes: Conjunctivae and EOM are normal. Pupils are equal, round, and reactive to light. Right eye exhibits no discharge. Left eye exhibits no discharge. No scleral icterus.  Neck: Normal range of motion. Neck supple.  Cardiovascular: Normal rate, regular rhythm, normal heart sounds and intact distal pulses.  Exam reveals no gallop and no friction rub.   No murmur heard. Pulmonary/Chest: Effort normal and breath sounds normal. No respiratory distress. She has no wheezes. She has no rales. She exhibits no tenderness.  Abdominal: Soft. Bowel sounds are normal.  Musculoskeletal: Normal range of motion. She exhibits no edema, tenderness or deformity.  Neurological: She is alert and oriented to person, place, and time. She has normal reflexes.  Skin: Skin is warm and dry. Rash noted. She is not diaphoretic.  Psychiatric: She has a normal mood and affect. Her behavior is normal. Judgment and thought content normal.   She is in no acute distress, rash is not present on face, and is now located on lower abdomen, lower back and bilateral buttocks, it is  mildly erythematous scattered  macular without welts or  warmth.   Discussed patient with Miquel Dunnommie Ann NP in clinic who had recently seen patient/ will do oral prednisone dose pack and follow up with dermatology if no improvement in symptoms.       Assessment & Plan:  1.  Meds ordered this encounter  Medications  . predniSONE (STERAPRED UNI-PAK 21 TAB) 10 MG (21) TBPK tablet    Sig: Take 6 tablets day one and decrease by one tablet each day until completed.    Dispense:  21 tablet    Refill:  0  Take Benadryl per package instructions at bedtime due to drowsiness. No driving with Benadryl.  2. Rash- Take Prednisone as directed, be seen in clinic or urgent care if any new symptoms develop or if symptoms fail to improve, change or worsen at anytime. Will refer to Dermatology if symptoms persist.  Patient verbalized understanding of above instructions.

## 2017-07-15 ENCOUNTER — Ambulatory Visit: Payer: Self-pay | Admitting: General Surgery

## 2017-08-16 ENCOUNTER — Ambulatory Visit: Payer: Self-pay | Admitting: General Surgery

## 2017-09-07 ENCOUNTER — Ambulatory Visit: Payer: Self-pay | Admitting: Physician Assistant

## 2017-09-07 ENCOUNTER — Encounter: Payer: Self-pay | Admitting: Physician Assistant

## 2017-09-07 VITALS — BP 130/90 | HR 79 | Temp 98.7°F | Resp 16

## 2017-09-07 DIAGNOSIS — J029 Acute pharyngitis, unspecified: Secondary | ICD-10-CM

## 2017-09-07 LAB — POCT RAPID STREP A (OFFICE): Rapid Strep A Screen: NEGATIVE

## 2017-09-07 MED ORDER — MAGIC MOUTHWASH: 5.0000 mL | Freq: Three times a day (TID) | ORAL | 0 refills | Status: DC | PRN

## 2017-09-07 NOTE — Progress Notes (Signed)
S: c/o sore throat with white patches in the back, no fever/chills, just really hurts to swallow, sx for 3 days, no cough or congestion, no cp/sob  O: vitals wnl, nad, tms clear, throat red with small bomps on uvula and some on  Tonsils, neck supple no lymph, lungs c t a, cv rrr, q strep negative  A: acute pharyngitis  P: viral so no antibiotic needed, dukes magic mouthwash for comfort tid prn, return if worsening

## 2017-10-18 ENCOUNTER — Ambulatory Visit (INDEPENDENT_AMBULATORY_CARE_PROVIDER_SITE_OTHER): Payer: Managed Care, Other (non HMO) | Admitting: General Surgery

## 2017-10-18 ENCOUNTER — Encounter: Payer: Self-pay | Admitting: General Surgery

## 2017-10-18 VITALS — BP 130/70 | HR 72 | Resp 14 | Ht 62.0 in | Wt 249.0 lb

## 2017-10-18 DIAGNOSIS — R1310 Dysphagia, unspecified: Secondary | ICD-10-CM | POA: Diagnosis not present

## 2017-10-18 DIAGNOSIS — Z1211 Encounter for screening for malignant neoplasm of colon: Secondary | ICD-10-CM

## 2017-10-18 DIAGNOSIS — R1319 Other dysphagia: Secondary | ICD-10-CM

## 2017-10-18 MED ORDER — POLYETHYLENE GLYCOL 3350 17 GM/SCOOP PO POWD: ORAL | 0 refills | Status: DC

## 2017-10-18 NOTE — Patient Instructions (Addendum)
Upper Endoscopy Upper endoscopy is a procedure to look inside the upper GI (gastrointestinal) tract. The upper GI tract is made up of:  The tube that carries food and liquid from your throat to your stomach (esophagus).  The stomach.  The first part of your small intestine (duodenum).  This procedure is also called esophagogastroduodenoscopy (EGD) or gastroscopy. In this procedure, your health care provider passes a thin, flexible tube (endoscope) through your mouth and down your esophagus into your stomach. A small camera is attached to the end of the tube. Images from the camera appear on a monitor in the exam room. During this procedure, your health care provider may also remove a small piece of tissue to be sent to a lab and examined under a microscope (biopsy). Your health care provider may do an upper endoscopy to diagnose cancers of the upper GI tract. You may also have this procedure to find the cause of other conditions, such as:  Stomach pain.  Heartburn.  Pain or problems when swallowing.  Nausea and vomiting.  Stomach bleeding.  Stomach ulcers.  Tell a health care provider about:  Any allergies you have.  All medicines you are taking, including vitamins, herbs, eye drops, creams, and over-the-counter medicines.  Any problems you or family members have had with anesthetic medicines.  Any blood disorders you have.  Any surgeries you have had.  Any medical conditions you have.  Whether you are pregnant or may be pregnant. What are the risks? Generally, this is a safe procedure. However, problems may occur, including:  Infection.  Bleeding.  Allergic reactions to medicines.  A tear or hole (perforation) in the esophagus, stomach, or duodenum.  What happens before the procedure?  Follow instructions from your health care provider about eating or drinking restrictions.  Ask your health care provider about changing or stopping your regular medicines. This is  especially important if you are taking diabetes medicines or blood thinners.  Plan to have someone take you home after the procedure.  If you go home right after the procedure, plan to have someone with you for 24 hours. What happens during the procedure?  An IV tube will be inserted into one of your veins.  Your throat may be sprayed with medicine that numbs the area (local anesthetic).  You may be given a medicine to help you relax (sedative).  You will lie on your left side.  Your health care provider will pass the endoscope through your mouth and down your esophagus.  Your provider will use the scope to check the inside of your esophagus, stomach, and duodenum. Biopsies may be taken. The procedure may vary among health care providers and hospitals. What happens after the procedure?  Do not drive for 24 hours if you received a sedative.  Your blood pressure, heart rate, breathing rate, and blood oxygen level will be monitored often until the medicines you were given have worn off.  When your throat is no longer numb, you may be given some fluids to drink.  It is your responsibility to get the results of your procedure. Ask your health care provider or the department performing the procedure when your results will be ready. This information is not intended to replace advice given to you by your health care provider. Make sure you discuss any questions you have with your health care provider. Document Released: 12/04/2000 Document Revised: 05/19/2016 Document Reviewed: 09/19/2015 Elsevier Interactive Patient Education  2017 Elsevier Inc. Colonoscopy, Adult A colonoscopy is an  exam to look at the large intestine. It is done to check for problems, such as:  Lumps (tumors).  Growths (polyps).  Swelling (inflammation).  Bleeding.  What happens before the procedure? Eating and drinking Follow instructions from your doctor about eating and drinking. These instructions may  include:  A few days before the procedure - follow a low-fiber diet. ? Avoid nuts. ? Avoid seeds. ? Avoid dried fruit. ? Avoid raw fruits. ? Avoid vegetables.  1-3 days before the procedure - follow a clear liquid diet. Avoid liquids that have red or purple dye. Drink only clear liquids, such as: ? Clear broth or bouillon. ? Black coffee or tea. ? Clear juice. ? Clear soft drinks or sports drinks. ? Gelatin dessert. ? Popsicles.  On the day of the procedure - do not eat or drink anything during the 2 hours before the procedure.  Bowel prep If you were prescribed an oral bowel prep:  Take it as told by your doctor. Starting the day before your procedure, you will need to drink a lot of liquid. The liquid will cause you to poop (have bowel movements) until your poop is almost clear or light green.  If your skin or butt gets irritated from diarrhea, you may: ? Wipe the area with wipes that have medicine in them, such as adult wet wipes with aloe and vitamin E. ? Put something on your skin that soothes the area, such as petroleum jelly.  If you throw up (vomit) while drinking the bowel prep, take a break for up to 60 minutes. Then begin the bowel prep again. If you keep throwing up and you cannot take the bowel prep without throwing up, call your doctor.  General instructions  Ask your doctor about changing or stopping your normal medicines. This is important if you take diabetes medicines or blood thinners.  Plan to have someone take you home from the hospital or clinic. What happens during the procedure?  An IV tube may be put into one of your veins.  You will be given medicine to help you relax (sedative).  To reduce your risk of infection: ? Your doctors will wash their hands. ? Your anal area will be washed with soap.  You will be asked to lie on your side with your knees bent.  Your doctor will get a long, thin, flexible tube ready. The tube will have a camera and a  light on the end.  The tube will be put into your anus.  The tube will be gently put into your large intestine.  Air will be delivered into your large intestine to keep it open. You may feel some pressure or cramping.  The camera will be used to take photos.  A small tissue sample may be removed from your body to be looked at under a microscope (biopsy). If any possible problems are found, the tissue will be sent to a lab for testing.  If small growths are found, your doctor may remove them and have them checked for cancer.  The tube that was put into your anus will be slowly removed. The procedure may vary among doctors and hospitals. What happens after the procedure?  Your doctor will check on you often until the medicines you were given have worn off.  Do not drive for 24 hours after the procedure.  You may have a small amount of blood in your poop.  You may pass gas.  You may have mild cramps or bloating  in your belly (abdomen).  It is up to you to get the results of your procedure. Ask your doctor, or the department performing the procedure, when your results will be ready. This information is not intended to replace advice given to you by your health care provider. Make sure you discuss any questions you have with your health care provider. Document Released: 01/09/2011 Document Revised: 10/07/2016 Document Reviewed: 02/18/2016 Elsevier Interactive Patient Education  2017 Elsevier Inc.  Dulcolax 10 mg two days prior to prep

## 2017-10-18 NOTE — Progress Notes (Signed)
Patient ID: Helen BalboaCynthia L Stanley, female   DOB: 02/09/1956, 61 y.o.   MRN: [id number]  Chief Complaint  Patient presents with  . Colonoscopy    HPI [name] is a 61 y.o. female here today for a evalaution of a screening colonoscopy. Last colonoscopy was done 04/22/2007. Moves her bowels every other day. No reported diarrhea or constipation. She does have problems with gas and bloating. She reports that she feels like most foods get stuck when she swallows. She has not found it necessary to regurgitate food for relief.  She does have a sense of fullness with eating.  She has had nausea but no vomiting.     She has gain about 20-25 pounds over the last year or so.   She cannot drink milkshakes or eat ice cream. [facility]   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  He husband has been ill for a while with a heart transplant then developed post transplant lung cancer.  He developed colon cancer last year and only had surgery done. He has also developed additional cancer in the front of the right hip/leg.   HPI  Past Medical History:  Diagnosis Date  . Arthritis   . Chicken pox   . Depression   . Heartburn   . History of blood transfusion   . History of hemorrhoids   . IDA (iron deficiency anemia)   . Kidney stones   . Morbid obesity (HCC)    s/p Gastric bypass    Past Surgical History:  Procedure Laterality Date  . ABDOMINAL HYSTERECTOMY  1997  . CERVICAL SPINE SURGERY     Fusion  . CESAREAN SECTION  1987  . CHOLECYSTECTOMY    . COLONOSCOPY  04/22/2007  . GASTRIC BYPASS  2007  . HERNIA REPAIR  2011  . OOPHORECTOMY    . TONSILLECTOMY AND ADENOIDECTOMY    . 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    Family History  Problem Relation Age of Onset   . Arthritis Mother   . Arthritis Father   . Lung cancer Father   . Arthritis Maternal Grandmother   . Kidney disease Maternal Grandmother   . Depression Maternal Grandmother   . Breast cancer Sister [phone]       three times  . Colon cancer Sister [phone]    Social History Social History  Substance Use Topics  . Smoking status: Never Smoker  . Smokeless tobacco: Never Used  . Alcohol use 0.0 - 0.6 oz/week    Allergies  Allergen Reactions  . Cefuroxime Axetil Swelling    Hands, throat   . Penicillins Swelling  . Sulfur Swelling    Current Outpatient Prescriptions  Medication Sig Dispense Refill  . Vitamin D, Ergocalciferol, (DRISDOL) 50000 units CAPS capsule Take 1 capsule (50,000 Units total) by mouth every 7 (seven) days. 12 capsule 3  . polyethylene glycol powder (GLYCOLAX/MIRALAX) powder 255 grams one bottle for colonoscopy prep 255 g 0   No current facility-administered medications for this visit.     Review of Systems Review of Systems  Constitutional: Negative.   Respiratory: Negative.   Cardiovascular: Negative.   Gastrointestinal: Positive for abdominal distention. Negative for abdominal pain, anal bleeding, blood in stool, constipation, diarrhea, nausea, rectal pain and vomiting.    Blood  pressure 130/70, pulse 72, resp. rate 14, height 5\' 2"  (1.575 m), weight 249 lb (112.9 kg).  Pre-bypass weight in 2008: 363 pounds.  Physical Exam Physical Exam  Constitutional: She is oriented to person, place, and time. She appears well-developed and well-nourished.  Eyes: Conjunctivae are normal. No scleral icterus.  Neck: Neck supple.  Cardiovascular: Normal rate, regular rhythm and normal heart sounds.   Pulmonary/Chest: Effort normal and breath sounds normal.  Lymphadenopathy:    She has no cervical adenopathy.  Neurological: She is alert and oriented to person, place, and time.  Skin: Skin is warm and dry.  Psychiatric: She has a normal mood and affect.    Data  Reviewed Apr 22, 2007 colonoscopy showed a descending colon polyp.  Biopsy showed benign lymphoid aggregate.  Reticulosis was noted.  August 12, 1992 colonoscopy for a suspicion of inflammatory bowel disease is unremarkable.  September 27, 2000 colonoscopy for occult blood loss showed 1 sessile polyp in the cecum.  Pathology showed a lymphoid nodule with nonnecrotizing epithelioid granuloma.  Upper endoscopy of September 27, 2000 for heartburn showed mild gastritis.  No hiatal hernia.  Biopsies of the prepyloric area showed superficial chronic gastritis.  No evidence of H. pylori.  Assessment    Dysphagia.  Candidate for screening colonoscopy.    Plan    Will schedule colonoscopy and upper endoscopy   The possibility of dilatation if a significant stricture is identified an upper endoscopy was reviewed.  The risks associated with dilatation including perforation were reviewed.     Colonoscopy/upper endoscopy with possible biopsy/polypectomy prn: Information regarding the procedure, including its potential risks and complications (including but not limited to perforation of the bowel, which may require emergency surgery to repair, and bleeding) was verbally given to the patient. Educational information regarding lower intestinal endoscopy was given to the patient. Written instructions for how to complete the bowel prep using Miralax were provided. The importance of drinking ample fluids to avoid dehydration as a result of the prep emphasized.  HPI, Physical Exam, Assessment and Plan have been scribed under the direction and in the presence of Donnalee Curry, MD.  [name], CMA  I have completed the exam and reviewed the above documentation for accuracy and completeness.  I agree with the above.  Museum/gallery conservator has been used and any errors in dictation or transcription are unintentional.  Donnalee Curry, M.D., F.A.C.S.   [name] 10/19/2017, 9:11 AM  Patient has been  scheduled for an upper and lower endoscopy on 11-30-17 at [facility]. Miralax prescription has been sent in to the patient's pharmacy today. She will make use of Dulcolax 10 mg morning and afternoon day prior to prep. Colonoscopy instructions have been reviewed with the patient. This patient is aware to call the office if they have further questions.   Nicholes Mango, CMA

## 2017-10-19 DIAGNOSIS — R1319 Other dysphagia: Secondary | ICD-10-CM | POA: Insufficient documentation

## 2017-10-19 DIAGNOSIS — Z1211 Encounter for screening for malignant neoplasm of colon: Secondary | ICD-10-CM | POA: Insufficient documentation

## 2017-10-19 DIAGNOSIS — R1310 Dysphagia, unspecified: Secondary | ICD-10-CM | POA: Insufficient documentation

## 2017-11-22 ENCOUNTER — Encounter: Payer: Self-pay | Admitting: *Deleted

## 2017-11-30 ENCOUNTER — Ambulatory Visit
Admission: RE | Admit: 2017-11-30 | Discharge: 2017-11-30 | Disposition: A | Discharge status: Home / Self Care | Payer: Managed Care, Other (non HMO) | Source: Ambulatory Visit | Attending: General Surgery | Admitting: General Surgery

## 2017-11-30 ENCOUNTER — Encounter: Payer: Self-pay | Admitting: *Deleted

## 2017-11-30 ENCOUNTER — Ambulatory Visit: Payer: Managed Care, Other (non HMO) | Admitting: Anesthesiology

## 2017-11-30 ENCOUNTER — Encounter
Admission: RE | Disposition: A | Discharge status: Home / Self Care | Payer: Self-pay | Source: Ambulatory Visit | Attending: General Surgery

## 2017-11-30 DIAGNOSIS — M1990 Unspecified osteoarthritis, unspecified site: Secondary | ICD-10-CM | POA: Diagnosis not present

## 2017-11-30 DIAGNOSIS — Z888 Allergy status to other drugs, medicaments and biological substances status: Secondary | ICD-10-CM | POA: Insufficient documentation

## 2017-11-30 DIAGNOSIS — Z9884 Bariatric surgery status: Secondary | ICD-10-CM | POA: Insufficient documentation

## 2017-11-30 DIAGNOSIS — K5730 Diverticulosis of large intestine without perforation or abscess without bleeding: Secondary | ICD-10-CM | POA: Diagnosis not present

## 2017-11-30 DIAGNOSIS — R1310 Dysphagia, unspecified: Secondary | ICD-10-CM | POA: Diagnosis not present

## 2017-11-30 DIAGNOSIS — Z6841 Body Mass Index (BMI) 40.0 and over, adult: Secondary | ICD-10-CM | POA: Diagnosis not present

## 2017-11-30 DIAGNOSIS — Z880 Allergy status to penicillin: Secondary | ICD-10-CM | POA: Insufficient documentation

## 2017-11-30 DIAGNOSIS — Z1211 Encounter for screening for malignant neoplasm of colon: Secondary | ICD-10-CM | POA: Insufficient documentation

## 2017-11-30 DIAGNOSIS — Z882 Allergy status to sulfonamides status: Secondary | ICD-10-CM | POA: Diagnosis not present

## 2017-11-30 DIAGNOSIS — F329 Major depressive disorder, single episode, unspecified: Secondary | ICD-10-CM | POA: Insufficient documentation

## 2017-11-30 DIAGNOSIS — R1319 Other dysphagia: Secondary | ICD-10-CM

## 2017-11-30 HISTORY — PX: ESOPHAGOGASTRODUODENOSCOPY (EGD) WITH PROPOFOL: SHX5813

## 2017-11-30 HISTORY — PX: COLONOSCOPY WITH PROPOFOL: SHX5780

## 2017-11-30 SURGERY — COLONOSCOPY WITH PROPOFOL
Anesthesia: General

## 2017-11-30 MED ORDER — PROPOFOL 500 MG/50ML IV EMUL
INTRAVENOUS | Status: DC | PRN
  Administered 2017-11-30: 120 ug/kg/min via INTRAVENOUS

## 2017-11-30 MED ORDER — BUTAMBEN-TETRACAINE-BENZOCAINE 2-2-14 % EX AERO
INHALATION_SPRAY | CUTANEOUS | Status: AC
  Filled 2017-11-30: qty 5

## 2017-11-30 MED ORDER — PROPOFOL 500 MG/50ML IV EMUL
INTRAVENOUS | Status: AC
  Filled 2017-11-30: qty 50

## 2017-11-30 MED ORDER — SODIUM CHLORIDE 0.9 % IV SOLN
INTRAVENOUS | Status: DC
  Administered 2017-11-30: 1000 mL via INTRAVENOUS

## 2017-11-30 MED ORDER — FENTANYL CITRATE (PF) 100 MCG/2ML IJ SOLN
INTRAMUSCULAR | Status: DC | PRN
  Administered 2017-11-30: 50 ug via INTRAVENOUS

## 2017-11-30 MED ORDER — MIDAZOLAM HCL 2 MG/2ML IJ SOLN
INTRAMUSCULAR | Status: AC
  Filled 2017-11-30: qty 2

## 2017-11-30 MED ORDER — EPHEDRINE SULFATE 50 MG/ML IJ SOLN
INTRAMUSCULAR | Status: AC
  Filled 2017-11-30: qty 1

## 2017-11-30 MED ORDER — EPHEDRINE SULFATE 50 MG/ML IJ SOLN
INTRAMUSCULAR | Status: DC | PRN
  Administered 2017-11-30 (×2): 10 mg via INTRAVENOUS

## 2017-11-30 MED ORDER — FENTANYL CITRATE (PF) 100 MCG/2ML IJ SOLN
INTRAMUSCULAR | Status: AC
  Filled 2017-11-30: qty 2

## 2017-11-30 MED ORDER — MIDAZOLAM HCL 2 MG/2ML IJ SOLN
INTRAMUSCULAR | Status: DC | PRN
  Administered 2017-11-30: 2 mg via INTRAVENOUS

## 2017-11-30 NOTE — Anesthesia Preprocedure Evaluation (Signed)
Anesthesia Evaluation  Patient identified by MRN, date of birth, ID band Patient awake    Reviewed: Allergy & Precautions, NPO status , Patient's Chart, lab work & pertinent test results  History of Anesthesia Complications Negative for: history of anesthetic complications  Airway Mallampati: II  TM Distance: >3 FB Neck ROM: Full    Dental no notable dental hx.    Pulmonary neg pulmonary ROS, neg sleep apnea, neg COPD,    breath sounds clear to auscultation- rhonchi (-) wheezing      Cardiovascular Exercise Tolerance: Good (-) hypertension(-) CAD, (-) Past MI and (-) Cardiac Stents  Rhythm:Regular Rate:Normal - Systolic murmurs and - Diastolic murmurs    Neuro/Psych PSYCHIATRIC DISORDERS Anxiety Depression negative neurological ROS     GI/Hepatic negative GI ROS, Neg liver ROS,   Endo/Other  negative endocrine ROS  Renal/GU Renal disease: hx of nephrolithiasis.     Musculoskeletal  (+) Arthritis ,   Abdominal (+) + obese,   Peds  Hematology  (+) anemia ,   Anesthesia Other Findings Past Medical History: No date: Arthritis No date: Chicken pox No date: Depression No date: Heartburn No date: History of blood transfusion No date: History of hemorrhoids No date: IDA (iron deficiency anemia) No date: Kidney stones No date: Morbid obesity (HCC)     Comment:  s/p Gastric bypass   Reproductive/Obstetrics                             Anesthesia Physical Anesthesia Plan  ASA: II  Anesthesia Plan: General   Post-op Pain Management:    Induction: Intravenous  PONV Risk Score and Plan: 2 and Propofol infusion  Airway Management Planned: Natural Airway  Additional Equipment:   Intra-op Plan:   Post-operative Plan:   Informed Consent: I have reviewed the patients History and Physical, chart, labs and discussed the procedure including the risks, benefits and alternatives for the  proposed anesthesia with the patient or authorized representative who has indicated his/her understanding and acceptance.   Dental advisory given  Plan Discussed with: CRNA and Anesthesiologist  Anesthesia Plan Comments:         Anesthesia Quick Evaluation

## 2017-11-30 NOTE — Transfer of Care (Signed)
Immediate Anesthesia Transfer of Care Note  Patient: [name]  Procedure(s) Performed: COLONOSCOPY WITH PROPOFOL (N/A ) ESOPHAGOGASTRODUODENOSCOPY (EGD) WITH PROPOFOL (N/A )  Patient Location: PACU  Anesthesia Type:General  Level of Consciousness: awake, alert  and sedated  Airway & Oxygen Therapy: Patient Spontanous Breathing and Patient connected to nasal cannula oxygen  Post-op Assessment: Report given to RN and Post -op Vital signs reviewed and stable  Post vital signs: Reviewed and stable  Last Vitals:  Vitals:   11/30/17 1226  BP: (!) 176/97  Pulse: 81  Resp: 18  Temp: (!) 35.8 C  SpO2: 100%    Last Pain:  Vitals:   11/30/17 1226  TempSrc: Tympanic         Complications: No apparent anesthesia complications

## 2017-11-30 NOTE — H&P (Signed)
Helen BalboaCynthia L Stanley [id number] 04/26/1956     HPI:  61 y.o female for screening colonoscopy.  History of dysphagia over the last several months, no regurgitation. No significant reflux symptoms. For upper and lower endoscopy. Tolerated prep well.   Medications Prior to Admission  Medication Sig Dispense Refill Last Dose  . polyethylene glycol powder (GLYCOLAX/MIRALAX) powder 255 grams one bottle for colonoscopy prep 255 g 0   . Vitamin D, Ergocalciferol, (DRISDOL) 50000 units CAPS capsule Take 1 capsule (50,000 Units total) by mouth every 7 (seven) days. 12 capsule 3 Taking   Allergies  Allergen Reactions  . Cefuroxime Axetil Swelling    Hands, throat   . Penicillins Swelling  . Sulfur Swelling   Past Medical History:  Diagnosis Date  . Arthritis   . Chicken pox   . Depression   . Heartburn   . History of blood transfusion   . History of hemorrhoids   . IDA (iron deficiency anemia)   . Kidney stones   . Morbid obesity (HCC)    s/p Gastric bypass   Past Surgical History:  Procedure Laterality Date  . ABDOMINAL HYSTERECTOMY  1997  . CERVICAL SPINE SURGERY     Fusion  . CESAREAN SECTION  1987  . CHOLECYSTECTOMY    . COLONOSCOPY  04/22/2007  . GASTRIC BYPASS  2007  . HERNIA REPAIR  2011  . OOPHORECTOMY    . TONSILLECTOMY AND ADENOIDECTOMY    . 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   Social History   Socioeconomic History  . Marital status: Married    Spouse name: Not on file  . Number of children: Not on file  . Years of education: Not on file  . Highest education level: Not on file  Social Needs  . Financial resource strain: Not on file  . Food insecurity - worry: Not on file  . Food insecurity - inability: Not on file  . Transportation needs - medical: Not on file  . Transportation needs - non-medical: Not on file  Occupational History  . Not on file  Tobacco Use  . Smoking status: Never Smoker  . Smokeless tobacco: Never Used   Substance and Sexual Activity  . Alcohol use: Yes    Alcohol/week: 0.0 - 0.6 oz  . Drug use: No  . Sexual activity: Not on file  Other Topics Concern  . Not on file  Social History Narrative  . Not on file   Social History   Social History Narrative  . Not on file     ROS: Negative.     PE: HEENT: Negative. Lungs: Clear. Cardio: RR.Marland Kitchen.  Assessment/Plan:  Proceed with planned endoscopy.  Earline MayotteByrnett, Dakwon Wenberg W 11/30/2017

## 2017-11-30 NOTE — Anesthesia Procedure Notes (Signed)
Performed by: Cook-Martin, Janis Cuffe Pre-anesthesia Checklist: Patient identified, Emergency Drugs available, Suction available, Patient being monitored and Timeout performed Patient Re-evaluated:Patient Re-evaluated prior to induction Oxygen Delivery Method: Nasal cannula Preoxygenation: Pre-oxygenation with 100% oxygen Induction Type: IV induction Airway Equipment and Method: Bite block Placement Confirmation: CO2 detector and positive ETCO2       

## 2017-11-30 NOTE — Anesthesia Post-op Follow-up Note (Signed)
Anesthesia QCDR form completed.        

## 2017-11-30 NOTE — Op Note (Signed)
[facility] Gastroenterology Patient Name: [name] Procedure Date: 11/30/2017 12:52 PM MRN: [id number] Account #: [id number] Date of Birth: 08/18/1956 Admit Type: Outpatient Age: 61 [facility] Gender: Female Note Status: Finalized Procedure:            Upper GI endoscopy Indications:          Dysphagia Providers:            Earline MayotteJeffrey W. Byrnett, MD Medicines:            Total IV Anesthesia (TIVA), Monitored Anesthesia Care Complications:        No immediate complications. Procedure:            Pre-Anesthesia Assessment:                       - Prior to the procedure, a History and Physical was                        performed, and patient medications, allergies and                        sensitivities were reviewed. The patient's tolerance of                        previous anesthesia was reviewed.                       - The risks and benefits of the procedure and the                        sedation options and risks were discussed with the                        patient. All questions were answered and informed                        consent was obtained.                       After obtaining informed consent, the endoscope was                        passed under direct vision. Throughout the procedure,                        the patient's blood pressure, pulse, and oxygen                        saturations were monitored continuously. The                        Colonoscope was introduced through the mouth, and                        advanced to the jejunum. The upper GI endoscopy was                        accomplished without difficulty. The patient tolerated                        the procedure well. Findings:  The esophagus was normal.      The stomach was normal.      The examined duodenum was normal. Impression:           - Normal esophagus.                       - Normal stomach.                       - Normal examined duodenum.                     - No specimens collected. Recommendation:       - Perform a colonoscopy tomorrow. Procedure Code(s):    --- Professional ---                       [phone], Esophagogastroduodenoscopy, flexible, transoral;                        diagnostic, including collection of specimen(s) by                        brushing or washing, when performed (separate procedure) Diagnosis Code(s):    --- Professional ---                       R13.10, Dysphagia, unspecified CPT copyright 2016 American Medical Association. All rights reserved. The codes documented in this report are preliminary and upon coder review may  be revised to meet current compliance requirements. Earline MayotteJeffrey W. Byrnett, MD 11/30/2017 1:08:28 PM This report has been signed electronically. Number of Addenda: 0 Note Initiated On: 11/30/2017 12:52 PM      [facility]

## 2017-11-30 NOTE — Op Note (Signed)
[facility] G[facility] Patient Name: [name] Procedure Date: 11/30/2017 12:52 PM MRN: [id number] Account #: [id number] Date of Birth: 10/20/1956 Admit Type: Outpatient Age: [age] [facility] Gender: Female Note Status: Finalized Procedure:            Colonoscopy Indications:          Screening for colorectal malignant neoplasm Providers:            Earline MayotteJeffrey W. Lorene Samaan, MD Medicines:            Monitored Anesthesia Care Complications:        No immediate complications. Procedure:            Pre-Anesthesia Assessment:                       - Prior to the procedure, a History and Physical was                        performed, and patient medications, allergies and                        sensitivities were reviewed. The patient's tolerance of                        previous anesthesia was reviewed.                       - The risks and benefits of the procedure and the                        sedation options and risks were discussed with the                        patient. All questions were answered and informed                        consent was obtained.                       After obtaining informed consent, the colonoscope was                        passed under direct vision. Throughout the procedure,                        the patient's blood pressure, pulse, and oxygen                        saturations were monitored continuously. The                        Colonoscope was introduced through the anus and                        advanced to the the cecum, identified by appendiceal                        orifice and ileocecal valve. The colonoscopy was                        performed without difficulty. The patient tolerated the  procedure well. The quality of the bowel preparation                        was excellent. Findings:      Many medium-mouthed diverticula were found in the sigmoid colon.      The retroflexed  view of the distal rectum and anal verge was normal and       showed no anal or rectal abnormalities. Impression:           - Diverticulosis in the sigmoid colon.                       - The distal rectum and anal verge are normal on                        retroflexion view.                       - No specimens collected. Recommendation:       - Repeat colonoscopy in 10 years for screening purposes. Procedure Code(s):    --- Professional ---                       [phone] [id number], Colonoscopy, flexible; diagnostic, including                        collection of specimen(s) by brushing or washing, when                        performed (separate procedure) Diagnosis Code(s):    --- Professional ---                       Z12.11, Encounter for screening for malignant neoplasm                        of colon                       K57.30, Diverticulosis of large intestine without                        perforation or abscess without bleeding CPT copyright 2016 American Medical Association. All rights reserved. The codes documented in this report are preliminary and upon coder review may  be revised to meet current compliance requirements. Earline MayotteJeffrey W. Chong Wojdyla, MD 11/30/2017 1:31:18 PM This report has been signed electronically. Number of Addenda: 0 Note Initiated On: 11/30/2017 12:52 PM Scope Withdrawal Time: 0 hours 11 minutes 24 seconds  Total Procedure Duration: 0 hours 19 minutes 39 seconds       [facility]

## 2017-12-01 ENCOUNTER — Encounter: Payer: Self-pay | Admitting: General Surgery

## 2017-12-01 NOTE — Anesthesia Postprocedure Evaluation (Signed)
Anesthesia Post Note  Patient: [name]  Procedure(s) Performed: COLONOSCOPY WITH PROPOFOL (N/A ) ESOPHAGOGASTRODUODENOSCOPY (EGD) WITH PROPOFOL (N/A )  Patient location during evaluation: Endoscopy Anesthesia Type: General Level of consciousness: awake and alert and oriented Pain management: pain level controlled Vital Signs Assessment: post-procedure vital signs reviewed and stable Respiratory status: spontaneous breathing, nonlabored ventilation and respiratory function stable Cardiovascular status: blood pressure returned to baseline and stable Postop Assessment: no signs of nausea or vomiting Anesthetic complications: no     Last Vitals:  Vitals:   11/30/17 1226 11/30/17 1334  BP: (!) 176/97   Pulse: 81   Resp: 18   Temp: (!) 35.8 C (!) 35.7 C  SpO2: 100%     Last Pain:  Vitals:   11/30/17 1334  TempSrc: Tympanic                 Tyreona Panjwani

## 2017-12-07 ENCOUNTER — Telehealth: Payer: Self-pay | Admitting: Emergency Medicine

## 2017-12-17 ENCOUNTER — Other Ambulatory Visit: Payer: Self-pay

## 2017-12-17 DIAGNOSIS — Z299 Encounter for prophylactic measures, unspecified: Secondary | ICD-10-CM

## 2017-12-17 NOTE — Progress Notes (Signed)
Patient came in to have blood drawn for testing for her yearly testing.

## 2017-12-18 LAB — VITAMIN B12: VITAMIN B 12: 629 pg/mL (ref 232–1245)

## 2017-12-18 LAB — CMP12+LP+TP+TSH+6AC+CBC/D/PLT
A/G RATIO: 1.7 (ref 1.2–2.2)
ALBUMIN: 4.1 g/dL (ref 3.6–4.8)
ALK PHOS: 73 IU/L (ref 39–117)
ALT: 9 IU/L (ref 0–32)
AST: 22 IU/L (ref 0–40)
BASOS ABS: 0 10*3/uL (ref 0.0–0.2)
BASOS: 1 %
BILIRUBIN TOTAL: 0.4 mg/dL (ref 0.0–1.2)
BUN/Creatinine Ratio: 14 (ref 12–28)
BUN: 13 mg/dL (ref 8–27)
CHLORIDE: 107 mmol/L — AB (ref 96–106)
CHOLESTEROL TOTAL: 147 mg/dL (ref 100–199)
Calcium: 9.1 mg/dL (ref 8.7–10.3)
Chol/HDL Ratio: 2.2 ratio (ref 0.0–4.4)
Creatinine, Ser: 0.92 mg/dL (ref 0.57–1.00)
EOS (ABSOLUTE): 0.1 10*3/uL (ref 0.0–0.4)
EOS: 3 %
Estimated CHD Risk: <0.5 times avg. (ref 0.0–1.0)
FREE THYROXINE INDEX: 2.2 (ref 1.2–4.9)
GFR calc Af Amer: 78 mL/min/{1.73_m2} (ref >59)
GFR calc non Af Amer: 67 mL/min/{1.73_m2} (ref >59)
GGT: 8 IU/L (ref 0–60)
GLOBULIN, TOTAL: 2.4 g/dL (ref 1.5–4.5)
Glucose: 82 mg/dL (ref 65–99)
HDL: 66 mg/dL (ref >39)
HEMATOCRIT: 39.8 % (ref 34.0–46.6)
HEMOGLOBIN: 13 g/dL (ref 11.1–15.9)
IMMATURE GRANS (ABS): 0 10*3/uL (ref 0.0–0.1)
IMMATURE GRANULOCYTES: 0 %
IRON: 66 ug/dL (ref 27–139)
LDH: 223 IU/L (ref 119–226)
LDL CALC: 65 mg/dL (ref 0–99)
LYMPHS ABS: 1.2 10*3/uL (ref 0.7–3.1)
LYMPHS: 28 %
MCH: 29.1 pg (ref 26.6–33.0)
MCHC: 32.7 g/dL (ref 31.5–35.7)
MCV: 89 fL (ref 79–97)
MONOS ABS: 0.5 10*3/uL (ref 0.1–0.9)
Monocytes: 11 %
NEUTROS PCT: 57 %
Neutrophils Absolute: 2.5 10*3/uL (ref 1.4–7.0)
PLATELETS: 246 10*3/uL (ref 150–379)
Phosphorus: 3 mg/dL (ref 2.5–4.5)
Potassium: 3.9 mmol/L (ref 3.5–5.2)
RBC: 4.47 x10E6/uL (ref 3.77–5.28)
RDW: 14.8 % (ref 12.3–15.4)
SODIUM: 145 mmol/L — AB (ref 134–144)
T3 UPTAKE RATIO: 30 % (ref 24–39)
T4, Total: 7.2 ug/dL (ref 4.5–12.0)
TRIGLYCERIDES: 78 mg/dL (ref 0–149)
TSH: 3.07 u[IU]/mL (ref 0.450–4.500)
Total Protein: 6.5 g/dL (ref 6.0–8.5)
Uric Acid: 5.4 mg/dL (ref 2.5–7.1)
VLDL CHOLESTEROL CAL: 16 mg/dL (ref 5–40)
WBC: 4.3 10*3/uL (ref 3.4–10.8)

## 2017-12-18 LAB — VITAMIN D 25 HYDROXY (VIT D DEFICIENCY, FRACTURES): Vit D, 25-Hydroxy: 28.9 ng/mL — ABNORMAL LOW (ref 30.0–100.0)

## 2018-05-24 ENCOUNTER — Emergency Department: Payer: Self-pay

## 2018-05-24 ENCOUNTER — Encounter: Payer: Self-pay | Admitting: Emergency Medicine

## 2018-05-24 ENCOUNTER — Emergency Department
Admission: EM | Admit: 2018-05-24 | Discharge: 2018-05-24 | Disposition: A | Discharge status: Home / Self Care | Payer: Self-pay | Attending: Emergency Medicine | Admitting: Emergency Medicine

## 2018-05-24 ENCOUNTER — Other Ambulatory Visit: Payer: Self-pay

## 2018-05-24 DIAGNOSIS — Z79899 Other long term (current) drug therapy: Secondary | ICD-10-CM | POA: Insufficient documentation

## 2018-05-24 DIAGNOSIS — M5441 Lumbago with sciatica, right side: Secondary | ICD-10-CM | POA: Insufficient documentation

## 2018-05-24 DIAGNOSIS — Y9389 Activity, other specified: Secondary | ICD-10-CM | POA: Insufficient documentation

## 2018-05-24 DIAGNOSIS — M5442 Lumbago with sciatica, left side: Secondary | ICD-10-CM | POA: Insufficient documentation

## 2018-05-24 DIAGNOSIS — W06XXXA Fall from bed, initial encounter: Secondary | ICD-10-CM | POA: Insufficient documentation

## 2018-05-24 DIAGNOSIS — W19XXXA Unspecified fall, initial encounter: Secondary | ICD-10-CM

## 2018-05-24 DIAGNOSIS — Y998 Other external cause status: Secondary | ICD-10-CM | POA: Insufficient documentation

## 2018-05-24 DIAGNOSIS — Y92009 Unspecified place in unspecified non-institutional (private) residence as the place of occurrence of the external cause: Secondary | ICD-10-CM

## 2018-05-24 DIAGNOSIS — Y92013 Bedroom of single-family (private) house as the place of occurrence of the external cause: Secondary | ICD-10-CM | POA: Insufficient documentation

## 2018-05-24 DIAGNOSIS — S39012A Strain of muscle, fascia and tendon of lower back, initial encounter: Secondary | ICD-10-CM | POA: Insufficient documentation

## 2018-05-24 MED ORDER — HYDROCODONE-ACETAMINOPHEN 5-325 MG PO TABS: 1.0000 | ORAL_TABLET | ORAL | 0 refills | Status: DC | PRN

## 2018-05-24 MED ORDER — MELOXICAM 15 MG PO TABS: 15.0000 mg | ORAL_TABLET | Freq: Every day | ORAL | 0 refills | Status: DC

## 2018-05-24 MED ORDER — MORPHINE SULFATE (PF) 4 MG/ML IV SOLN
4.0000 mg | Freq: Once | INTRAVENOUS | Status: AC
  Administered 2018-05-24: 4 mg via INTRAMUSCULAR
  Filled 2018-05-24: qty 1

## 2018-05-24 MED ORDER — OXYCODONE-ACETAMINOPHEN 5-325 MG PO TABS
1.0000 | ORAL_TABLET | Freq: Once | ORAL | Status: AC
  Administered 2018-05-24: 1 via ORAL
  Filled 2018-05-24: qty 1

## 2018-05-24 MED ORDER — KETOROLAC TROMETHAMINE 30 MG/ML IJ SOLN
30.0000 mg | Freq: Once | INTRAMUSCULAR | Status: AC
  Administered 2018-05-24: 30 mg via INTRAMUSCULAR
  Filled 2018-05-24: qty 1

## 2018-05-24 MED ORDER — METHOCARBAMOL 500 MG PO TABS
500.0000 mg | ORAL_TABLET | Freq: Four times a day (QID) | ORAL | 0 refills | Status: DC
Start: 2018-05-24 — End: 2021-12-29

## 2018-05-24 MED ORDER — ONDANSETRON 8 MG PO TBDP
8.0000 mg | ORAL_TABLET | Freq: Once | ORAL | Status: AC
  Administered 2018-05-24: 8 mg via ORAL
  Filled 2018-05-24: qty 1

## 2018-05-24 MED ORDER — ORPHENADRINE CITRATE 30 MG/ML IJ SOLN
60.0000 mg | Freq: Once | INTRAMUSCULAR | Status: AC
  Administered 2018-05-24: 60 mg via INTRAMUSCULAR
  Filled 2018-05-24 (×2): qty 2

## 2018-05-24 NOTE — ED Notes (Signed)
See triage note  Presents with back pain since she fell from bed  Min to no relief with ibuprofen

## 2018-05-24 NOTE — ED Provider Notes (Addendum)
[facility] Emergency Department Provider Note  ____________________________________________  Time seen: Approximately 6:59 PM  I have reviewed the triage vital signs and the nursing notes.   HISTORY  Chief Complaint Fall and Back Pain    HPI [name] is a 62 y.o. female who presents the emergency department complaining of sharp lower back pain.  Patient reports that 2 days ago, she was in bed when her alarm on her phone went off.  Patient reached over to turn it off, falling out of bed and striking her lower back on a step stool.  Patient reports that she had instant sharp pain to the lower back.  She denies any bowel or bladder dysfunction, saddle anesthesia or paresthesias associated with this.  Patient reports that the pain has only been worsening instead of improving.  Patient does have a history of cervical spine fusion, lumbar degenerative disc disease.  She has some mild numbness and tingling in bilateral lower extremities at this time.  Patient did not hit her head or lose consciousness.  She denies any other injury or complaint at this time.  Patient has tried multiple over-the-counter medications, previously prescribed tramadol, heat, ice without relief.  Past Medical History:  Diagnosis Date  . Arthritis   . Chicken pox   . Depression   . Heartburn   . History of blood transfusion   . History of hemorrhoids   . IDA (iron deficiency anemia)   . Kidney stones   . Morbid obesity (HCC)    s/p Gastric bypass    Patient Active Problem List   Diagnosis Date Noted  . Esophageal dysphagia 10/19/2017  . Encounter for screening colonoscopy 10/19/2017  . Osteoarthritis of patellofemoral joint 05/04/2017  . Other iron deficiency anemia 07/24/2016  . S/P gastric bypass 06/11/2016  . Morbid obesity with BMI of 45.0-49.9, adult (HCC) 06/11/2016  . Anxiety and depression 06/11/2016  . Encounter for preventative adult health care exam with abnormal  findings 06/11/2016    Past Surgical History:  Procedure Laterality Date  . ABDOMINAL HYSTERECTOMY  1997  . CERVICAL SPINE SURGERY     Fusion  . CESAREAN SECTION  1987  . CHOLECYSTECTOMY    . COLONOSCOPY  04/22/2007  . COLONOSCOPY WITH PROPOFOL N/A 11/30/2017   Procedure: COLONOSCOPY WITH PROPOFOL;  Surgeon: [name], MD;  Location: [facility] ENDOSCOPY;  Service: Endoscopy;  Laterality: N/A;  . ESOPHAGOGASTRODUODENOSCOPY (EGD) WITH PROPOFOL N/A 11/30/2017   Procedure: ESOPHAGOGASTRODUODENOSCOPY (EGD) WITH PROPOFOL;  Surgeon: [name], MD;  Location: [facility] ENDOSCOPY;  Service: Endoscopy;  Laterality: N/A;  . GASTRIC BYPASS  2007  . HERNIA REPAIR  2011  . OOPHORECTOMY    . TONSILLECTOMY AND ADENOIDECTOMY    . 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    Prior to Admission medications   Medication Sig Start Date End Date Taking? Authorizing Provider  HYDROcodone-acetaminophen (NORCO/VICODIN) 5-325 MG tablet Take 1 tablet by mouth every 4 (four) hours as needed for moderate pain. 05/24/18   Cuthriell, Delorise Royals, PA-C  meloxicam (MOBIC) 15 MG tablet Take 1 tablet (15 mg total) by mouth daily. 05/24/18   Cuthriell, Delorise Royals, PA-C  methocarbamol (ROBAXIN) 500 MG tablet Take 1 tablet (500 mg total) by mouth 4 (four) times daily. 05/24/18   Cuthriell, Delorise Royals, PA-C  polyethylene glycol powder (GLYCOLAX/MIRALAX) powder 255 grams one bottle for colonoscopy prep 10/18/17   Byrnett, Merrily Pew, MD  Vitamin D, Ergocalciferol, (DRISDOL) 50000 units  CAPS capsule Take 1 capsule (50,000 Units total) by mouth every 7 (seven) days. 05/04/17   Faythe Ghee, PA-C    Allergies Cefuroxime axetil; Penicillins; and Sulfur  Family History  Problem Relation Age of Onset  . Arthritis Mother   . Arthritis Father   . Lung cancer Father   . Arthritis Maternal Grandmother   . Kidney disease Maternal Grandmother   . Depression Maternal Grandmother   . Breast cancer  Sister 28       three times  . Colon cancer Sister 64    Social History Social History   Tobacco Use  . Smoking status: Never Smoker  . Smokeless tobacco: Never Used  Substance Use Topics  . Alcohol use: Yes    Alcohol/week: 0.0 - 0.6 oz  . Drug use: No     Review of Systems  Constitutional: No fever/chills Eyes: No visual changes. Cardiovascular: no chest pain. Respiratory: no cough. No SOB. Gastrointestinal: No abdominal pain.  No nausea, no vomiting.  No diarrhea.  No constipation. Genitourinary: Negative for dysuria. No hematuria Musculoskeletal: Positive for sharp lower back pain with mild numbness and tingling bilateral lower extremities. Skin: Negative for rash, abrasions, lacerations, ecchymosis. Neurological: Negative for headaches, focal weakness or numbness. 10-point ROS otherwise negative.  ____________________________________________   PHYSICAL EXAM:  VITAL SIGNS: ED Triage Vitals  Enc Vitals Group     BP 05/24/18 1836 (!) 192/128     Pulse Rate 05/24/18 1836 70     Resp 05/24/18 1836 18     Temp 05/24/18 1836 98.7 F (37.1 C)     Temp Source 05/24/18 1836 Oral     SpO2 05/24/18 1836 100 %     Weight 05/24/18 1837 239 lb (108.4 kg)     Height 05/24/18 1837 5\' 3"  (1.6 m)     Head Circumference --      Peak Flow --      Pain Score 05/24/18 1837 9     Pain Loc --      Pain Edu? --      Excl. in GC? --      Constitutional: Alert and oriented. Well appearing and in no acute distress. Eyes: Conjunctivae are normal. PERRL. EOMI. Head: Atraumatic. Neck: No stridor.  No cervical spine tenderness to palpation.  Cardiovascular: Normal rate, regular rhythm. Normal S1 and S2.  Good peripheral circulation. Respiratory: Normal respiratory effort without tachypnea or retractions. Lungs CTAB. Good air entry to the bases with no decreased or absent breath sounds. Gastrointestinal: Bowel sounds 4 quadrants. Soft and nontender to palpation. No guarding or  rigidity. No palpable masses. No distention. No CVA tenderness. Musculoskeletal: Full range of motion to all extremities. No gross deformities appreciated.  No gross deformity, edema noted to the lumbar spine.  Patient is diffusely tender to palpation throughout the lumbar spine.  She is also tender to palpation bilateral paraspinal muscle regions, greater on left than right.  No palpable abnormality or step-off.  No tenderness to palpation over bilateral sciatic notches.  Positive straight leg raise bilaterally.  Dorsalis pedis pulse intact bilateral lower extremities.  Sensation intact and equal bilateral lower extremities. Neurologic:  Normal speech and language. No gross focal neurologic deficits are appreciated.  Skin:  Skin is warm, dry and intact. No rash noted. Psychiatric: Mood and affect are normal. Speech and behavior are normal. Patient exhibits appropriate insight and judgement.   ____________________________________________   LABS (all labs ordered are listed, but only abnormal results are displayed)  Labs Reviewed - No data to display ____________________________________________  EKG   ____________________________________________  RADIOLOGY Festus Barren Cuthriell, personally viewed and evaluated these images (plain radiographs) as part of my medical decision making, as well as reviewing the written report by the radiologist.  Multiple degenerative findings.  Consistent with previous MRI.  No acute osseous abnormality.  Dg Lumbar Spine 2-3 Views  Result Date: 05/24/2018 CLINICAL DATA:  Fall 2 days ago.  Left lumbosacral back pain. EXAM: LUMBAR SPINE - 2-3 VIEW COMPARISON:  Lumbar spine MRI 01/17/2015 FINDINGS: Mild levo scoliotic curvature of the lumbar spine, chronic. Grade 2 anterolisthesis of L5 on S1 is similar to prior MRI secondary to chronic bilateral L5 pars interarticularis defects. Mild retrolisthesis of L1 on L2 is also unchanged. No evidence of acute fracture. Mild  flattening of lower thoracic vertebra is unchanged from prior exam. Multilevel endplate spurring with disc space narrowing L1-L2 and T12-L1. Facet arthropathy at L2-L3 and L3-L4. Sacroiliac joints are congruent. IMPRESSION: 1. No fracture or acute abnormality of the lumbar spine. 2. Chronic degenerative disc disease and facet arthropathy. 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 Electronically Signed   By: Rubye Oaks M.D.   On: 05/24/2018 19:13    ____________________________________________    PROCEDURES  Procedure(s) performed:    Procedures    Medications  morphine 4 MG/ML injection 4 mg (4 mg Intramuscular Given 05/24/18 1917)  ondansetron (ZOFRAN-ODT) disintegrating tablet 8 mg (8 mg Oral Given 05/24/18 1917)     ____________________________________________   INITIAL IMPRESSION / ASSESSMENT AND PLAN / ED COURSE  Pertinent labs & imaging results that were available during my care of the patient were reviewed by me and considered in my medical decision making (see chart for details).  Review of the [place] CSRS was performed in accordance of the [facility] prior to dispensing any controlled drugs.     Patient's diagnosis is consistent with fall, lumbar contusion, lumbar strain.  Patient presents with lower back pain after falling out of her bed striking a stool.  Patient has significant degenerative changes at baseline.  She was concern for worsening.  X-ray reveals no acute osseous abnormality when compared with previous MRI on record.  Patient is given symptom control medications.  No further work-up deemed necessary at this time.. Patient will be discharged home with prescriptions for prescriptions for Vicodin, Robaxin, meloxicam. Patient is to follow up with primary care or neurosurgery as needed or otherwise directed. Patient is given ED precautions to return to the ED for any worsening or new  symptoms.  Addendum: Patient with significantly elevated blood pressure on arrival.  Patient reports that she has a remote history of hypertension but typically her blood pressure is unremarkable.  Patient reports that she is "freaked out" with the possibility of a permanent injury to her back.  Patient also reports that her pain is significant.  Patient's blood pressure improved to 170/100 after pain medication.  Patient reports that at her primary care office its typically 120s or 130s over 70s or 80s.  At this time, I will not place patient on antihypertensive medications.  I feel that this is likely stress and pain induced.  Patient is advised of the blood pressure readings and advised to follow-up with primary care.  If at that time she continues to have elevated readings, primary care will manage symptoms.  At this time, no indication of hypertensive emergency or hypertensive urgency.  ____________________________________________  FINAL CLINICAL IMPRESSION(S) / ED DIAGNOSES  Final diagnoses:  Fall in home, initial encounter  Strain of lumbar region, initial encounter  Acute midline low back pain with bilateral sciatica      NEW MEDICATIONS STARTED DURING THIS VISIT:  ED Discharge Orders        Ordered    HYDROcodone-acetaminophen (NORCO/VICODIN) 5-325 MG tablet  Every 4 hours PRN     05/24/18 2004    meloxicam (MOBIC) 15 MG tablet  Daily     05/24/18 2004    methocarbamol (ROBAXIN) 500 MG tablet  4 times daily     05/24/18 2004          This chart was dictated using voice recognition software/Dragon. Despite best efforts to proofread, errors can occur which can change the meaning. Any change was purely unintentional.    Cuthriell, Delorise RoyalsJonathan D, PA-C 05/24/18 2005    Cuthriell, Renold DonJonathan D, PA-C 05/24/18 2038    Sharyn CreamerQuale, Mark, MD 05/25/18 [phone]

## 2018-05-24 NOTE — ED Triage Notes (Signed)
Pt reports that her phone fell out her bed on Sunday, she tried to use Ibuprofen, heat and ice. She states that she can not get relief. She is hurting on her left lower back. She did fall on step stool. She is able is to ambulate with slow steady gait.

## 2020-03-08 ENCOUNTER — Ambulatory Visit: Payer: Self-pay

## 2020-09-27 ENCOUNTER — Other Ambulatory Visit: Payer: Self-pay | Admitting: Internal Medicine

## 2020-09-27 DIAGNOSIS — Z1231 Encounter for screening mammogram for malignant neoplasm of breast: Secondary | ICD-10-CM

## 2020-11-01 ENCOUNTER — Ambulatory Visit
Admission: RE | Admit: 2020-11-01 | Discharge: 2020-11-01 | Disposition: A | Discharge status: Home / Self Care | Payer: 59 | Source: Ambulatory Visit | Attending: Internal Medicine | Admitting: Internal Medicine

## 2020-11-01 ENCOUNTER — Other Ambulatory Visit: Payer: Self-pay

## 2020-11-01 DIAGNOSIS — Z1231 Encounter for screening mammogram for malignant neoplasm of breast: Secondary | ICD-10-CM | POA: Insufficient documentation

## 2021-01-10 ENCOUNTER — Ambulatory Visit: Payer: 59

## 2021-02-11 ENCOUNTER — Other Ambulatory Visit: Payer: Self-pay | Admitting: Physical Medicine & Rehabilitation

## 2021-02-11 DIAGNOSIS — M5441 Lumbago with sciatica, right side: Secondary | ICD-10-CM

## 2021-02-11 DIAGNOSIS — G8929 Other chronic pain: Secondary | ICD-10-CM

## 2021-02-11 DIAGNOSIS — M542 Cervicalgia: Secondary | ICD-10-CM

## 2021-02-26 ENCOUNTER — Ambulatory Visit
Admission: RE | Admit: 2021-02-26 | Discharge: 2021-02-26 | Disposition: A | Discharge status: Home / Self Care | Payer: 59 | Source: Ambulatory Visit | Attending: Physical Medicine & Rehabilitation | Admitting: Physical Medicine & Rehabilitation

## 2021-02-26 ENCOUNTER — Other Ambulatory Visit: Payer: Self-pay

## 2021-02-26 DIAGNOSIS — G8929 Other chronic pain: Secondary | ICD-10-CM | POA: Insufficient documentation

## 2021-02-26 DIAGNOSIS — M5441 Lumbago with sciatica, right side: Secondary | ICD-10-CM | POA: Diagnosis present

## 2021-02-26 DIAGNOSIS — M542 Cervicalgia: Secondary | ICD-10-CM

## 2021-12-04 ENCOUNTER — Other Ambulatory Visit: Payer: Self-pay | Admitting: Orthopedic Surgery

## 2021-12-04 DIAGNOSIS — M25561 Pain in right knee: Secondary | ICD-10-CM

## 2021-12-04 DIAGNOSIS — M2391 Unspecified internal derangement of right knee: Secondary | ICD-10-CM

## 2021-12-10 ENCOUNTER — Other Ambulatory Visit: Payer: Self-pay | Admitting: Physical Medicine & Rehabilitation

## 2021-12-10 DIAGNOSIS — M5441 Lumbago with sciatica, right side: Secondary | ICD-10-CM

## 2021-12-10 DIAGNOSIS — M48062 Spinal stenosis, lumbar region with neurogenic claudication: Secondary | ICD-10-CM

## 2021-12-12 ENCOUNTER — Encounter: Payer: Self-pay | Admitting: Internal Medicine

## 2021-12-17 ENCOUNTER — Encounter: Payer: Self-pay | Admitting: Internal Medicine

## 2021-12-23 ENCOUNTER — Ambulatory Visit
Admission: RE | Admit: 2021-12-23 | Discharge: 2021-12-23 | Disposition: A | Discharge status: Home / Self Care | Payer: Medicare Other | Source: Ambulatory Visit | Attending: Orthopedic Surgery | Admitting: Orthopedic Surgery

## 2021-12-23 ENCOUNTER — Encounter: Payer: Self-pay | Admitting: Radiology

## 2021-12-23 DIAGNOSIS — M25561 Pain in right knee: Secondary | ICD-10-CM | POA: Insufficient documentation

## 2021-12-23 DIAGNOSIS — G8929 Other chronic pain: Secondary | ICD-10-CM | POA: Diagnosis present

## 2021-12-23 DIAGNOSIS — M48062 Spinal stenosis, lumbar region with neurogenic claudication: Secondary | ICD-10-CM | POA: Insufficient documentation

## 2021-12-23 DIAGNOSIS — M5441 Lumbago with sciatica, right side: Secondary | ICD-10-CM | POA: Diagnosis present

## 2021-12-23 DIAGNOSIS — M2391 Unspecified internal derangement of right knee: Secondary | ICD-10-CM | POA: Insufficient documentation

## 2021-12-24 ENCOUNTER — Ambulatory Visit: Payer: Medicare Other

## 2021-12-29 ENCOUNTER — Other Ambulatory Visit: Payer: Self-pay | Admitting: Orthopedic Surgery

## 2022-01-06 ENCOUNTER — Other Ambulatory Visit: Payer: Self-pay

## 2022-01-06 ENCOUNTER — Other Ambulatory Visit
Admission: RE | Admit: 2022-01-06 | Discharge: 2022-01-06 | Disposition: A | Discharge status: Home / Self Care | Payer: PRIVATE HEALTH INSURANCE | Source: Ambulatory Visit | Attending: Orthopedic Surgery | Admitting: Orthopedic Surgery

## 2022-01-06 DIAGNOSIS — F32A Depression, unspecified: Secondary | ICD-10-CM

## 2022-01-06 DIAGNOSIS — F419 Anxiety disorder, unspecified: Secondary | ICD-10-CM

## 2022-01-06 DIAGNOSIS — Z9884 Bariatric surgery status: Secondary | ICD-10-CM

## 2022-01-06 HISTORY — DX: Personal history of urinary calculi: Z87.442

## 2022-01-06 NOTE — Patient Instructions (Addendum)
Your procedure is scheduled on: 01/15/2022 Report to the Registration Desk on the 1st floor of the [facility].  To find out your arrival time, please call [phone] between 1PM - 3PM on: 01/14/2022   REMEMBER: Instructions that are not followed completely may result in serious medical risk, up to and including death; or upon the discretion of your surgeon and anesthesiologist your surgery may need to be rescheduled.  Do not eat food after midnight the night before surgery.  No gum chewing, lozengers or hard candies.  You may however, drink CLEAR liquids up to 2 hours before you are scheduled to arrive for your surgery. Do not drink anything within 2 hours of your scheduled arrival time.  Clear liquids include: - water  - apple juice without pulp - gatorade (not RED, PURPLE, OR BLUE) - black coffee or tea (Do NOT add milk or creamers to the coffee or tea) Do NOT drink anything that is not on this list.   In addition, your doctor has ordered for you to drink the provided  Ensure Pre-Surgery Clear Carbohydrate Drink  Drinking this carbohydrate drink up to two hours before surgery helps to reduce insulin resistance and improve patient outcomes. Please complete drinking 2 hours prior to scheduled arrival time.  TAKE THESE MEDICATIONS THE MORNING OF SURGERY WITH A SIP OF WATER:  Wellbutrin Omeprazole- take one at night before and one in morning of surgery. Topamax   One week prior to surgery: Stop Anti-inflammatories (NSAIDS) such as Advil, Aleve, Ibuprofen, Motrin, Naproxen, Naprosyn and Aspirin based products such as Excedrin, Goodys Powder, BC Powder.  Stop ANY OVER THE COUNTER supplements until after surgery. You may however, continue to take Tylenol if needed for pain up until the day of surgery.  No Alcohol for 24 hours before or after surgery.  No Smoking including e-cigarettes for 24 hours prior to surgery.  No chewable tobacco products for at least 6 hours prior to  surgery.  No nicotine patches on the day of surgery.  Do not use any "recreational" drugs for at least a week prior to your surgery.  Please be advised that the combination of cocaine and anesthesia may have negative outcomes, up to and including death. If you test positive for cocaine, your surgery will be cancelled.  On the morning of surgery brush your teeth with toothpaste and water, you may rinse your mouth with mouthwash if you wish. Do not swallow any toothpaste or mouthwash.  Use CHG Soap or wipes as directed on instruction sheet.  Do not wear jewelry, make-up, hairpins, clips or nail polish.  Do not wear lotions, powders, or perfumes.   Do not shave body from the neck down 48 hours prior to surgery just in case you cut yourself which could leave a site for infection.  Also, freshly shaved skin may become irritated if using the CHG soap.  Contact lenses, hearing aids and dentures may not be worn into surgery.  Do not bring valuables to the hospital. [facility] is not responsible for any missing/lost belongings or valuables.    Notify your doctor if there is any change in your medical condition (cold, fever, infection).  Wear comfortable clothing (specific to your surgery type) to the hospital.  After surgery, you can help prevent lung complications by doing breathing exercises.  Take deep breaths and cough every 1-2 hours. Your doctor may order a device called an Incentive Spirometer to help you take deep breaths.  If you are being admitted  to the hospital overnight, leave your suitcase in the car. After surgery it may be brought to your room.  If you are being discharged the day of surgery, you will not be allowed to drive home. You will need a responsible adult (18 years or older) to drive you home and stay with you that night.   If you are taking public transportation, you will need to have a responsible adult (18 years or older) with you. Please confirm with your  physician that it is acceptable to use public transportation.   Please call the [place] [facility] at [phone] if you have any questions about these instructions.  Surgery Visitation Policy:  Patients undergoing a surgery or procedure may have one family member or support person with them as long as that person is not COVID-19 positive or experiencing its symptoms.  That person may remain in the waiting area during the procedure and may rotate out with other people.

## 2022-01-07 ENCOUNTER — Encounter: Payer: Self-pay | Admitting: Urgent Care

## 2022-01-07 ENCOUNTER — Encounter
Admission: RE | Admit: 2022-01-07 | Discharge: 2022-01-07 | Disposition: A | Discharge status: Home / Self Care | Payer: Medicare Other | Source: Ambulatory Visit | Attending: Orthopedic Surgery | Admitting: Orthopedic Surgery

## 2022-01-07 DIAGNOSIS — Z9884 Bariatric surgery status: Secondary | ICD-10-CM | POA: Diagnosis not present

## 2022-01-07 DIAGNOSIS — Z01810 Encounter for preprocedural cardiovascular examination: Secondary | ICD-10-CM | POA: Diagnosis not present

## 2022-01-07 DIAGNOSIS — Z01818 Encounter for other preprocedural examination: Secondary | ICD-10-CM | POA: Diagnosis present

## 2022-01-07 LAB — BASIC METABOLIC PANEL
Anion gap: 5 (ref 5–15)
BUN: 12 mg/dL (ref 8–23)
CO2: 22 mmol/L (ref 22–32)
Calcium: 8.9 mg/dL (ref 8.9–10.3)
Chloride: 112 mmol/L — ABNORMAL HIGH (ref 98–111)
Creatinine, Ser: 1.46 mg/dL — ABNORMAL HIGH (ref 0.44–1.00)
GFR, Estimated: 40 mL/min — ABNORMAL LOW (ref >60)
Glucose, Bld: 89 mg/dL (ref 70–99)
Potassium: 4 mmol/L (ref 3.5–5.1)
Sodium: 139 mmol/L (ref 135–145)

## 2022-01-07 LAB — CBC
HCT: 33.8 % — ABNORMAL LOW (ref 36.0–46.0)
Hemoglobin: 10.5 g/dL — ABNORMAL LOW (ref 12.0–15.0)
MCH: 28.2 pg (ref 26.0–34.0)
MCHC: 31.1 g/dL (ref 30.0–36.0)
MCV: 90.6 fL (ref 80.0–100.0)
Platelets: 309 10*3/uL (ref 150–400)
RBC: 3.73 MIL/uL — ABNORMAL LOW (ref 3.87–5.11)
RDW: 15.3 % (ref 11.5–15.5)
WBC: 4.9 10*3/uL (ref 4.0–10.5)
nRBC: 0 % (ref 0.0–0.2)

## 2022-01-15 ENCOUNTER — Ambulatory Visit: Payer: Medicare Other | Admitting: Certified Registered Nurse Anesthetist

## 2022-01-15 ENCOUNTER — Encounter: Payer: Self-pay | Admitting: Orthopedic Surgery

## 2022-01-15 ENCOUNTER — Other Ambulatory Visit: Payer: Self-pay

## 2022-01-15 ENCOUNTER — Encounter
Admission: RE | Disposition: A | Discharge status: Home / Self Care | Payer: Self-pay | Source: Home / Self Care | Attending: Orthopedic Surgery

## 2022-01-15 ENCOUNTER — Ambulatory Visit
Admission: RE | Admit: 2022-01-15 | Discharge: 2022-01-15 | Disposition: A | Discharge status: Home / Self Care | Payer: Medicare Other | Attending: Orthopedic Surgery | Admitting: Orthopedic Surgery

## 2022-01-15 DIAGNOSIS — S83271A Complex tear of lateral meniscus, current injury, right knee, initial encounter: Secondary | ICD-10-CM | POA: Insufficient documentation

## 2022-01-15 DIAGNOSIS — D649 Anemia, unspecified: Secondary | ICD-10-CM | POA: Insufficient documentation

## 2022-01-15 DIAGNOSIS — S83231A Complex tear of medial meniscus, current injury, right knee, initial encounter: Secondary | ICD-10-CM | POA: Insufficient documentation

## 2022-01-15 DIAGNOSIS — Z6841 Body Mass Index (BMI) 40.0 and over, adult: Secondary | ICD-10-CM | POA: Diagnosis not present

## 2022-01-15 DIAGNOSIS — M2341 Loose body in knee, right knee: Secondary | ICD-10-CM | POA: Diagnosis not present

## 2022-01-15 DIAGNOSIS — X58XXXA Exposure to other specified factors, initial encounter: Secondary | ICD-10-CM | POA: Insufficient documentation

## 2022-01-15 DIAGNOSIS — Z9049 Acquired absence of other specified parts of digestive tract: Secondary | ICD-10-CM | POA: Diagnosis not present

## 2022-01-15 DIAGNOSIS — M1711 Unilateral primary osteoarthritis, right knee: Secondary | ICD-10-CM | POA: Insufficient documentation

## 2022-01-15 HISTORY — PX: KNEE ARTHROSCOPY WITH MEDIAL MENISECTOMY: SHX5651

## 2022-01-15 SURGERY — Surgical Case
Anesthesia: *Unknown

## 2022-01-15 SURGERY — ARTHROSCOPY, KNEE, WITH MEDIAL MENISCECTOMY
Anesthesia: General | Site: Knee | Laterality: Right

## 2022-01-15 MED ORDER — ACETAMINOPHEN 10 MG/ML IV SOLN: 1000.0000 mg | Freq: Once | INTRAVENOUS | Status: DC | PRN

## 2022-01-15 MED ORDER — PROPOFOL 10 MG/ML IV BOLUS
INTRAVENOUS | Status: AC
  Filled 2022-01-15: qty 20

## 2022-01-15 MED ORDER — ONDANSETRON HCL 4 MG/2ML IJ SOLN: 4.0000 mg | Freq: Once | INTRAMUSCULAR | Status: DC | PRN

## 2022-01-15 MED ORDER — LIDOCAINE HCL (PF) 2 % IJ SOLN
INTRAMUSCULAR | Status: AC
  Filled 2022-01-15: qty 5

## 2022-01-15 MED ORDER — BUPIVACAINE-EPINEPHRINE (PF) 0.5% -1:200000 IJ SOLN
INTRAMUSCULAR | Status: AC
  Filled 2022-01-15: qty 30

## 2022-01-15 MED ORDER — FENTANYL CITRATE (PF) 100 MCG/2ML IJ SOLN
INTRAMUSCULAR | Status: AC
  Filled 2022-01-15: qty 2

## 2022-01-15 MED ORDER — DEXAMETHASONE SODIUM PHOSPHATE 10 MG/ML IJ SOLN
INTRAMUSCULAR | Status: DC | PRN
  Administered 2022-01-15: 10 mg via INTRAVENOUS

## 2022-01-15 MED ORDER — HYDROCODONE-ACETAMINOPHEN 5-325 MG PO TABS: 1.0000 | ORAL_TABLET | Freq: Four times a day (QID) | ORAL | 0 refills | Status: DC | PRN

## 2022-01-15 MED ORDER — ONDANSETRON HCL 4 MG/2ML IJ SOLN
INTRAMUSCULAR | Status: DC | PRN
Start: 2022-01-15 — End: 2022-01-15
  Administered 2022-01-15: 4 mg via INTRAVENOUS

## 2022-01-15 MED ORDER — CEFAZOLIN SODIUM-DEXTROSE 2-4 GM/100ML-% IV SOLN
INTRAVENOUS | Status: AC
  Filled 2022-01-15: qty 100

## 2022-01-15 MED ORDER — LIDOCAINE HCL (CARDIAC) PF 100 MG/5ML IV SOSY
PREFILLED_SYRINGE | INTRAVENOUS | Status: DC | PRN
  Administered 2022-01-15: 80 mg via INTRAVENOUS

## 2022-01-15 MED ORDER — FENTANYL CITRATE (PF) 100 MCG/2ML IJ SOLN
INTRAMUSCULAR | Status: AC
  Administered 2022-01-15: 25 ug via INTRAVENOUS
  Filled 2022-01-15: qty 2

## 2022-01-15 MED ORDER — FENTANYL CITRATE (PF) 100 MCG/2ML IJ SOLN
25.0000 ug | INTRAMUSCULAR | Status: DC | PRN
  Administered 2022-01-15 (×3): 25 ug via INTRAVENOUS

## 2022-01-15 MED ORDER — BUPIVACAINE-EPINEPHRINE (PF) 0.5% -1:200000 IJ SOLN
INTRAMUSCULAR | Status: DC | PRN
  Administered 2022-01-15: 20 mL via PERINEURAL

## 2022-01-15 MED ORDER — CEFAZOLIN SODIUM-DEXTROSE 2-4 GM/100ML-% IV SOLN
2.0000 g | INTRAVENOUS | Status: AC
  Administered 2022-01-15: 2 g via INTRAVENOUS

## 2022-01-15 MED ORDER — FENTANYL CITRATE (PF) 100 MCG/2ML IJ SOLN
INTRAMUSCULAR | Status: DC | PRN
Start: 2022-01-15 — End: 2022-01-15
  Administered 2022-01-15 (×2): 25 ug via INTRAVENOUS

## 2022-01-15 MED ORDER — OXYCODONE HCL 5 MG PO TABS
5.0000 mg | ORAL_TABLET | Freq: Once | ORAL | Status: AC | PRN
  Administered 2022-01-15: 5 mg via ORAL

## 2022-01-15 MED ORDER — OXYCODONE HCL 5 MG/5ML PO SOLN: 5.0000 mg | Freq: Once | ORAL | Status: AC | PRN

## 2022-01-15 MED ORDER — PROPOFOL 10 MG/ML IV BOLUS
INTRAVENOUS | Status: DC | PRN
  Administered 2022-01-15: 150 mg via INTRAVENOUS
  Administered 2022-01-15: 50 mg via INTRAVENOUS

## 2022-01-15 MED ORDER — LACTATED RINGERS IV SOLN: INTRAVENOUS | Status: DC

## 2022-01-15 MED ORDER — OXYCODONE HCL 5 MG PO TABS
ORAL_TABLET | ORAL | Status: AC
  Filled 2022-01-15: qty 1

## 2022-01-15 MED ORDER — CHLORHEXIDINE GLUCONATE 0.12 % MT SOLN
OROMUCOSAL | Status: AC
  Administered 2022-01-15: 15 mL via OROMUCOSAL
  Filled 2022-01-15: qty 15

## 2022-01-15 MED ORDER — LACTATED RINGERS IR SOLN
Status: DC | PRN
  Administered 2022-01-15 (×3): 3000 mL

## 2022-01-15 MED ORDER — ONDANSETRON HCL 4 MG/2ML IJ SOLN
INTRAMUSCULAR | Status: AC
  Filled 2022-01-15: qty 2

## 2022-01-15 MED ORDER — GLYCOPYRROLATE 0.2 MG/ML IJ SOLN
INTRAMUSCULAR | Status: DC | PRN
  Administered 2022-01-15: .2 mg via INTRAVENOUS

## 2022-01-15 MED ORDER — CHLORHEXIDINE GLUCONATE 0.12 % MT SOLN: 15.0000 mL | Freq: Once | OROMUCOSAL | Status: AC

## 2022-01-15 MED ORDER — ORAL CARE MOUTH RINSE: 15.0000 mL | Freq: Once | OROMUCOSAL | Status: AC

## 2022-01-15 MED ORDER — PHENYLEPHRINE HCL (PRESSORS) 10 MG/ML IV SOLN
INTRAVENOUS | Status: DC | PRN
  Administered 2022-01-15: 80 ug via INTRAVENOUS
  Administered 2022-01-15: 40 ug via INTRAVENOUS
  Administered 2022-01-15: 80 ug via INTRAVENOUS
  Administered 2022-01-15: 100 ug via INTRAVENOUS

## 2022-01-15 SURGICAL SUPPLY — 31 items
APL PRP STRL LF DISP 70% ISPRP (MISCELLANEOUS) ×1
BLADE INCISOR PLUS 4.5 (BLADE) IMPLANT
BNDG ELASTIC 4X5.8 VLCR STR LF (GAUZE/BANDAGES/DRESSINGS) IMPLANT
CHLORAPREP W/TINT 26 (MISCELLANEOUS) ×3 IMPLANT
CUFF TOURN SGL QUICK 24 (TOURNIQUET CUFF)
CUFF TOURN SGL QUICK 34 (TOURNIQUET CUFF)
CUFF TRNQT CYL 24X4X16.5-23 (TOURNIQUET CUFF) IMPLANT
CUFF TRNQT CYL 34X4.125X (TOURNIQUET CUFF) IMPLANT
DRAPE ARTHRO LIMB 89X125 STRL (DRAPES) ×3 IMPLANT
DRAPE C-ARMOR (DRAPES) IMPLANT
GAUZE SPONGE 4X4 12PLY STRL (GAUZE/BANDAGES/DRESSINGS) ×3 IMPLANT
GLOVE SURG SYN 9.0  PF PI (GLOVE) ×2
GLOVE SURG SYN 9.0 PF PI (GLOVE) ×2 IMPLANT
GOWN SRG 2XL LVL 4 RGLN SLV (GOWNS) ×2 IMPLANT
GOWN STRL NON-REIN 2XL LVL4 (GOWNS) ×2
GOWN STRL REUS W/ TWL LRG LVL3 (GOWN DISPOSABLE) ×4 IMPLANT
GOWN STRL REUS W/TWL LRG LVL3 (GOWN DISPOSABLE) ×4
IV LACTATED RINGER IRRG 3000ML (IV SOLUTION) ×6
IV LR IRRIG 3000ML ARTHROMATIC (IV SOLUTION) ×4 IMPLANT
KIT TURNOVER KIT A (KITS) ×3 IMPLANT
MANIFOLD NEPTUNE II (INSTRUMENTS) ×5 IMPLANT
NEEDLE HYPO 22GX1.5 SAFETY (NEEDLE) ×3 IMPLANT
PACK ARTHROSCOPY KNEE (MISCELLANEOUS) ×3 IMPLANT
SCALPEL PROTECTED #11 DISP (BLADE) ×3 IMPLANT
SPONGE T-LAP 18X18 ~~LOC~~+RFID (SPONGE) ×3 IMPLANT
SUT ETHILON 4-0 (SUTURE) ×2
SUT ETHILON 4-0 FS2 18XMFL BLK (SUTURE) ×1
SUTURE ETHLN 4-0 FS2 18XMF BLK (SUTURE) ×2 IMPLANT
TUBING INFLOW SET DBFLO PUMP (TUBING) ×3 IMPLANT
TUBING OUTFLOW SET DBLFO PUMP (TUBING) ×3 IMPLANT
WAND COBLATION FLOW 50 (SURGICAL WAND) IMPLANT

## 2022-01-15 NOTE — Transfer of Care (Signed)
Immediate Anesthesia Transfer of Care Note  Patient: [name]  Procedure(s) Performed: Right knee partial medial and lateral meniscectomies and loose body removal (Right: Knee)  Patient Location: PACU  Anesthesia Type:General  Level of Consciousness: drowsy  Airway & Oxygen Therapy: Patient Spontanous Breathing and Patient connected to face mask oxygen  Post-op Assessment: Report given to RN and Post -op Vital signs reviewed and stable  Post vital signs: Reviewed and stable  Last Vitals:  Vitals Value Taken Time  BP 117/91 01/15/22 1249  Temp    Pulse 95 01/15/22 1251  Resp 17 01/15/22 1251  SpO2 100 % 01/15/22 1251  Vitals shown include unvalidated device data.  Last Pain:  Vitals:   01/15/22 1044  TempSrc: Oral  PainSc: 0-No pain         Complications: No notable events documented.

## 2022-01-15 NOTE — Anesthesia Postprocedure Evaluation (Signed)
Anesthesia Post Note  Patient: [name]  Procedure(s) Performed: Right knee partial medial and lateral meniscectomies and loose body removal (Right: Knee)  Patient location during evaluation: PACU Anesthesia Type: General Level of consciousness: awake and alert Pain management: pain level controlled Vital Signs Assessment: post-procedure vital signs reviewed and stable Respiratory status: spontaneous breathing, nonlabored ventilation, respiratory function stable and patient connected to nasal cannula oxygen Cardiovascular status: blood pressure returned to baseline and stable Postop Assessment: no apparent nausea or vomiting Anesthetic complications: no   No notable events documented.   Last Vitals:  Vitals:   01/15/22 1328 01/15/22 1330  BP:  (!) 145/97  Pulse: 85 85  Resp: 16 17  Temp:  36.4 C  SpO2: 100% 100%    Last Pain:  Vitals:   01/15/22 1330  TempSrc:   PainSc: 4                  Corinda Gubler

## 2022-01-15 NOTE — H&P (Signed)
Chief Complaint  Patient presents with   Right Knee - Pain    History of the Present Illness: [name] is a 66 y.o. female here today for follow-up of right knee arthritis. Prior x-rays from 12/04/2021 shows significant patellofemoral, as well as medial compartment degenerative changes. She saw Baldwin [place], PA here at that time. 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 She returns today to discuss possible arthroscopy, as her MRI did show extensive medial and lateral meniscus tears.  The patient locates her pain to the posterior and lateral aspects of her right knee. She has pain when getting up from a chair and getting into a car. She cannot get into bed due to pain. She states the right leg pain is constant and sharp. She does not recall any injury or trauma.  The patient is going on a cruise to [place] with her family in 03/2022, and she has a lot of walking to do.  She is widowed and lives alone. She states she has been active all her life.  I have reviewed past medical, surgical, social and family history, and allergies as documented in the EMR.  Past Medical History: Past Medical History:  Diagnosis Date   Anxiety and depression 06/11/2016  Last Assessment & Plan: New problem (to me). Starting on Zoloft 50 mg daily.   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 # Ferritin- 13/ hb-13- plan IV Venofer- no previous reactions. Ferritin is pending down. Recommend IV iron. # trial of sublingual b12. Currently getting b12 IM thru employee health. # labs/ possible IV venofer in 4 months & 8 months. MD in 8 months/ labs few days prior #   Past Surgical History: Past Surgical History:  Procedure Laterality Date   LAPAROSCOPIC GASTRIC BYPASS 2009   CESAREAN SECTION    CHOLECYSTECTOMY   HERNIA REPAIR   HYSTERECTOMY   Past Family History: Family History  Problem Relation Age of Onset   Osteoporosis (Thinning of bones) Mother   Breast cancer Sister   Medications: Current Outpatient Medications Ordered in Epic  Medication Sig Dispense Refill   buPROPion (WELLBUTRIN XL) 300 MG XL tablet Take 1 tablet (300 mg total) by mouth once daily 90 tablet 11   cyanocobalamin, vitamin B-12, (VITAMIN B12 ORAL) Take by mouth once daily   gabapentin (NEURONTIN) 300 MG capsule 1 po bid 60 capsule 5   HYDROcodone-acetaminophen (NORCO) 5-325 mg tablet   omeprazole (PRILOSEC) 20 MG DR capsule Take 20 mg by mouth once daily   topiramate (TOPAMAX) 50 MG tablet Take 1 tablet (50 mg total) by mouth 2 (two) times daily 180 tablet 11   No current Epic-ordered facility-administered medications on file.   Allergies: Allergies  Allergen Reactions   Penicillins Rash   Ceftin [Cefuroxime Axetil] Swelling  Hands, throat   Cephalexin Swelling   Sulfa (Sulfonamide Antibiotics) Swelling    Body mass index is 41.41 kg/m.  Review of Systems: A comprehensive 14 point ROS was performed, reviewed, and the pertinent orthopaedic findings are documented in the HPI.  Vitals:  12/26/21 0911  BP: 130/82    General Physical Examination:  General/Constitutional: No apparent distress: well-nourished and well developed. Eyes: Pupils equal, round with synchronous movement. Lungs: Clear to auscultation HEENT: Normal Vascular: No edema, swelling or tenderness, except as noted in detailed exam. Cardiac: Heart rate  and rhythm is regular. Integumentary: No impressive skin lesions present, except as noted in detailed exam. Neuro/Psych: Normal mood and affect, oriented to person, place and time.  On exam, the patient has a positive lateral McMurray test. No instability. Large Baker cyst. Patellofemoral crepitation. Slight varus deformity that is passively correctable. Right knee range of  motion of 5-110 degrees.  Radiographs:  The patient had an MRI that shows medial and lateral meniscus tears.  Assessment: ICD-10-CM  1. Internal derangement of right knee M23.91  2. Morbid obesity with BMI of 40.0-44.9, adult (CMS-HCC) E66.01  Z68.41  3. Complex tear of medial meniscus of right knee as current injury, initial encounter S83.231A  4. Complex tear of lateral meniscus of right knee as current injury, initial encounter S83.271A  5. Primary osteoarthritis of right knee M17.11   Plan:  The patient has clinical findings of right knee medial and lateral meniscus tears, possible loose body.  We discussed the patient's prior x-ray and MRI findings. I explained she has fairly significant arthritis in her right knee, as well as meniscus tears on the lateral aspect of her right knee and osteophytes. The patient viewed the AAOS video on arthroscopy. We will plan for right knee arthroscopy, partial medial and lateral meniscectomy, and possible loose body removal and hopefully, this will let her get mobilized to do the things she wants to do in life. I did try to explain that if her arthritis is causing most of her pain, she is likely to have persistent pain and may need knee arthroplasty.  Surgical Risks:  The nature of the condition and the proposed procedure has been reviewed in detail with the patient. Surgical versus non-surgical options and prognosis for recovery have been reviewed and the inherent risks and benefits of each have been discussed including the risks of infection, bleeding, injury to nerves / blood vessels / tendons, incomplete relief of symptoms, persisting pain and / or stiffness, loss of function, complex regional pain syndrome, failure of procedure, as appropriate.  I, Sowjanya Panditi, Quality Documentation Specialist, completed documentation using DAX technology. Electronically signed by Marlena Clipper, MD at 12/26/2021 1:23 PM EST  Reviewed  H+P. No  changes noted.

## 2022-01-15 NOTE — Anesthesia Preprocedure Evaluation (Signed)
Anesthesia Evaluation  Patient identified by MRN, date of birth, ID band Patient awake    Reviewed: Allergy & Precautions, NPO status , Patient's Chart, lab work & pertinent test results  History of Anesthesia Complications Negative for: history of anesthetic complications  Airway Mallampati: II  TM Distance: <3 FB Neck ROM: Full    Dental no notable dental hx. (+) Teeth Intact   Pulmonary neg pulmonary ROS, neg sleep apnea, neg COPD, Patient abstained from smoking.Not current smoker,    Pulmonary exam normal breath sounds clear to auscultation       Cardiovascular Exercise Tolerance: Good METS(-) hypertension(-) CAD and (-) Past MI negative cardio ROS  (-) dysrhythmias  Rhythm:Regular Rate:Normal - Systolic murmurs    Neuro/Psych PSYCHIATRIC DISORDERS Anxiety Depression negative neurological ROS     GI/Hepatic neg GERD  ,(+)     (-) substance abuse  ,   Endo/Other  neg diabetes  Renal/GU negative Renal ROS     Musculoskeletal  (+) Arthritis ,   Abdominal   Peds  Hematology  (+) anemia ,   Anesthesia Other Findings Past Medical History: No date: Arthritis No date: Chicken pox No date: Depression No date: Heartburn No date: History of blood transfusion No date: History of hemorrhoids No date: History of kidney stones No date: IDA (iron deficiency anemia) No date: Kidney stones No date: Morbid obesity (HCC)     Comment:  s/p Gastric bypass  Reproductive/Obstetrics                            Anesthesia Physical Anesthesia Plan  ASA: 2  Anesthesia Plan: General   Post-op Pain Management: Ofirmev IV (intra-op) and Toradol IV (intra-op)   Induction: Intravenous  PONV Risk Score and Plan: 3 and Ondansetron, Dexamethasone and Midazolam  Airway Management Planned: LMA  Additional Equipment: None  Intra-op Plan:   Post-operative Plan: Extubation in OR  Informed Consent:  I have reviewed the patients History and Physical, chart, labs and discussed the procedure including the risks, benefits and alternatives for the proposed anesthesia with the patient or authorized representative who has indicated his/her understanding and acceptance.     Dental advisory given  Plan Discussed with: CRNA and Surgeon  Anesthesia Plan Comments: (Discussed risks of anesthesia with patient, including PONV, sore throat, lip/dental/eye damage. Rare risks discussed as well, such as cardiorespiratory and neurological sequelae, and allergic reactions. Discussed the role of CRNA in patient's perioperative care. Patient understands.  Patient has listed allergy to PCN (chldhood allergy) and cephalexin (swelling in the hands). Severe blistering skin reaction (SJS/TEN)? no Liver or kidney injury caused by PCN? no Hemolytic anemia from PCN? no Drug fever? no Painful swollen joints? no Severe reaction involving inside of mouth, eye, or genital ulcers? no Based on current evidence Elizebeth Koller et al, J Allergy Clin Immunol Pract, 2019), will proceed with cefazolin use: Yes . No shared sidechains between cefazolin and cephalexin. )        Anesthesia Quick Evaluation

## 2022-01-15 NOTE — Op Note (Signed)
01/15/2022  12:53 PM  PATIENT:  [name]  66 y.o. female  PRE-OPERATIVE DIAGNOSIS:  Internal derangement of right knee M23.91 Complex tear of medial meniscus of right knee as current injury, initial encounter  S83.231A Compex tear of lateral meniscus of right knee as current injury, initial encounter  S83.271A Degenerative arthritis right knee and possible loose body  POST-OPERATIVE DIAGNOSIS:  Internal derangement of right knee M23.91 Complex tear of medial meniscus of right knee as current injury, initial encounter  S83.231A Compex tear of lateral meniscus of right knee as current injury, initial encounter  S83.271A Degenerative arthritis right knee and loose body  PROCEDURE:  Procedure(s): Right knee partial medial and lateral meniscectomies and loose body removal (Right)  SURGEON: Leitha Schuller, MD  ASSISTANTS: None  ANESTHESIA:   general  EBL:  Total I/O In: 600 [I.V.:500; IV Piggyback:100] Out: 5 [Blood:5]  BLOOD ADMINISTERED:none  DRAINS: none   LOCAL MEDICATIONS USED:  MARCAINE     SPECIMEN:  No Specimen  DISPOSITION OF SPECIMEN:  N/A  COUNTS:  YES  TOURNIQUET:   Total Tourniquet Time Documented: Thigh (Left) - 30 minutes Total: Thigh (Left) - 30 minutes   IMPLANTS: None  DICTATION: .Dragon Dictation patient was brought to the operating room and after adequate anesthesia was obtained the right leg was placed in the arthroscopic leg holder with a tourniquet applied and the leg prepped and draped in usual sterile fashion.  After patient identification and timeout procedure and all arthroscopy equipment set up an inferior lateral portal was made and the arthroscope was introduced.  This was made more difficult secondary to patient body habitus.  Initial inspection revealed advanced degenerative changes of the patellofemoral joint with normal tracking but exposed bone on both sides of the joint.  There are no loose bodies in the gutters.  Coming on the medial  compartment there is exposed bone over the most of the tibial articular surface essentially all the femoral articular surface.  There was a tear of the posterior third to going into the middle third of large flap tear which was ablated with shaver and ArthroCare wand.  Going to the notch a loose body was identified and removed with the shaver and there was an anterior tear as well as a posterior horn tear of the lateral meniscus addressed with a shaver initially and then ArthroCare wand there again was significant degenerative changes to the lateral compartment with some areas of exposed bone.  The ACL was lax with evidence of chronic tear consistent with MRI findings.  After addressing meniscal pathology and thoroughly irrigating the knee all instrumentation was withdrawn and the wounds closed with simple erupted 4-0 nylon along with infiltration of 10 cc of half percent Sensorcaine in the area around each joint.  The wound was then dressed with Xeroform 4 x 4's web roll and Ace wrap after letting the tourniquet down.  PLAN OF CARE: Discharge to home after PACU  PATIENT DISPOSITION:  PACU - hemodynamically stable.

## 2022-01-15 NOTE — Anesthesia Procedure Notes (Signed)
Procedure Name: LMA Insertion Date/Time: 01/15/2022 11:49 AM Performed by: Hezzie Bump, CRNA Pre-anesthesia Checklist: Patient identified, Patient being monitored, Timeout performed, Emergency Drugs available and Suction available Patient Re-evaluated:Patient Re-evaluated prior to induction Oxygen Delivery Method: Circle system utilized Preoxygenation: Pre-oxygenation with 100% oxygen Induction Type: IV induction Ventilation: Mask ventilation without difficulty LMA: LMA inserted LMA Size: 3.5 Tube type: Oral Number of attempts: 1 Placement Confirmation: positive ETCO2 and breath sounds checked- equal and bilateral Tube secured with: Tape Dental Injury: Teeth and Oropharynx as per pre-operative assessment

## 2022-01-15 NOTE — Discharge Instructions (Addendum)
Keep leg elevated is much as possible. Pain medicine as directed Weight-bear as tolerated on right leg but try not to walk too much Call office if you are having any problems If bandage slides down your leg remove entire bandage and put a Band-Aid over each of the incisions and then rewrap the Ace wrap.  AMBULATORY SURGERY  DISCHARGE INSTRUCTIONS   The drugs that you were given will stay in your system until tomorrow so for the next 24 hours you should not:  Drive an automobile Make any legal decisions Drink any alcoholic beverage   You may resume regular meals tomorrow.  Today it is better to start with liquids and gradually work up to solid foods.  You may eat anything you prefer, but it is better to start with liquids, then soup and crackers, and gradually work up to solid foods.   Please notify your doctor immediately if you have any unusual bleeding, trouble breathing, redness and pain at the surgery site, drainage, fever, or pain not relieved by medication.    Additional Instructions:        Please contact your physician with any problems or Same Day Surgery at [phone], Monday through Friday 6 am to 4 pm, or [facility] number at [phone].

## 2022-01-16 ENCOUNTER — Encounter: Payer: Self-pay | Admitting: Orthopedic Surgery

## 2022-02-27 ENCOUNTER — Other Ambulatory Visit: Payer: Self-pay | Admitting: Internal Medicine

## 2022-02-27 DIAGNOSIS — N289 Disorder of kidney and ureter, unspecified: Secondary | ICD-10-CM

## 2022-03-03 ENCOUNTER — Other Ambulatory Visit: Payer: Self-pay | Admitting: Internal Medicine

## 2022-03-03 DIAGNOSIS — N289 Disorder of kidney and ureter, unspecified: Secondary | ICD-10-CM

## 2022-03-12 ENCOUNTER — Ambulatory Visit
Admission: RE | Admit: 2022-03-12 | Discharge: 2022-03-12 | Disposition: A | Discharge status: Home / Self Care | Payer: Medicare Other | Source: Ambulatory Visit | Attending: Internal Medicine | Admitting: Internal Medicine

## 2022-03-12 ENCOUNTER — Encounter: Payer: Self-pay | Admitting: Internal Medicine

## 2022-03-12 DIAGNOSIS — N289 Disorder of kidney and ureter, unspecified: Secondary | ICD-10-CM

## 2022-08-11 ENCOUNTER — Other Ambulatory Visit: Payer: Self-pay | Admitting: Internal Medicine

## 2022-08-11 DIAGNOSIS — Z1231 Encounter for screening mammogram for malignant neoplasm of breast: Secondary | ICD-10-CM

## 2022-09-04 ENCOUNTER — Ambulatory Visit
Admission: RE | Admit: 2022-09-04 | Discharge: 2022-09-04 | Disposition: A | Discharge status: Home / Self Care | Payer: Medicare Other | Source: Ambulatory Visit | Attending: Internal Medicine | Admitting: Internal Medicine

## 2022-09-04 DIAGNOSIS — Z1231 Encounter for screening mammogram for malignant neoplasm of breast: Secondary | ICD-10-CM | POA: Insufficient documentation

## 2024-01-03 ENCOUNTER — Inpatient Hospital Stay
Admission: EM | Admit: 2024-01-03 | Discharge: 2024-01-07 | DRG: 389 | Disposition: A | Discharge status: Other Acute Inpatient Hospital | Payer: Medicare Other | Attending: Internal Medicine | Admitting: Internal Medicine

## 2024-01-03 ENCOUNTER — Inpatient Hospital Stay: Payer: Medicare Other

## 2024-01-03 ENCOUNTER — Emergency Department: Payer: Medicare Other

## 2024-01-03 ENCOUNTER — Other Ambulatory Visit: Payer: Self-pay

## 2024-01-03 DIAGNOSIS — K922 Gastrointestinal hemorrhage, unspecified: Secondary | ICD-10-CM | POA: Diagnosis not present

## 2024-01-03 DIAGNOSIS — Z8261 Family history of arthritis: Secondary | ICD-10-CM

## 2024-01-03 DIAGNOSIS — I10 Essential (primary) hypertension: Secondary | ICD-10-CM | POA: Diagnosis present

## 2024-01-03 DIAGNOSIS — K219 Gastro-esophageal reflux disease without esophagitis: Secondary | ICD-10-CM | POA: Diagnosis present

## 2024-01-03 DIAGNOSIS — R1084 Generalized abdominal pain: Principal | ICD-10-CM

## 2024-01-03 DIAGNOSIS — R935 Abnormal findings on diagnostic imaging of other abdominal regions, including retroperitoneum: Secondary | ICD-10-CM | POA: Diagnosis not present

## 2024-01-03 DIAGNOSIS — Z9884 Bariatric surgery status: Secondary | ICD-10-CM | POA: Diagnosis not present

## 2024-01-03 DIAGNOSIS — F32A Depression, unspecified: Secondary | ICD-10-CM | POA: Diagnosis present

## 2024-01-03 DIAGNOSIS — K921 Melena: Secondary | ICD-10-CM | POA: Diagnosis present

## 2024-01-03 DIAGNOSIS — K567 Ileus, unspecified: Principal | ICD-10-CM | POA: Diagnosis present

## 2024-01-03 DIAGNOSIS — D62 Acute posthemorrhagic anemia: Secondary | ICD-10-CM | POA: Diagnosis not present

## 2024-01-03 DIAGNOSIS — E860 Dehydration: Secondary | ICD-10-CM | POA: Diagnosis present

## 2024-01-03 DIAGNOSIS — Z880 Allergy status to penicillin: Secondary | ICD-10-CM

## 2024-01-03 DIAGNOSIS — Z7985 Long-term (current) use of injectable non-insulin antidiabetic drugs: Secondary | ICD-10-CM

## 2024-01-03 DIAGNOSIS — Z79899 Other long term (current) drug therapy: Secondary | ICD-10-CM | POA: Diagnosis not present

## 2024-01-03 DIAGNOSIS — E669 Obesity, unspecified: Secondary | ICD-10-CM | POA: Insufficient documentation

## 2024-01-03 DIAGNOSIS — E66812 Obesity, class 2: Secondary | ICD-10-CM | POA: Diagnosis present

## 2024-01-03 DIAGNOSIS — E785 Hyperlipidemia, unspecified: Secondary | ICD-10-CM | POA: Diagnosis present

## 2024-01-03 DIAGNOSIS — D75839 Thrombocytosis, unspecified: Secondary | ICD-10-CM | POA: Diagnosis present

## 2024-01-03 DIAGNOSIS — Z980 Intestinal bypass and anastomosis status: Secondary | ICD-10-CM

## 2024-01-03 DIAGNOSIS — N179 Acute kidney failure, unspecified: Secondary | ICD-10-CM | POA: Diagnosis present

## 2024-01-03 DIAGNOSIS — Z818 Family history of other mental and behavioral disorders: Secondary | ICD-10-CM

## 2024-01-03 DIAGNOSIS — Z888 Allergy status to other drugs, medicaments and biological substances status: Secondary | ICD-10-CM | POA: Diagnosis not present

## 2024-01-03 DIAGNOSIS — Z882 Allergy status to sulfonamides status: Secondary | ICD-10-CM | POA: Diagnosis not present

## 2024-01-03 DIAGNOSIS — N1832 Chronic kidney disease, stage 3b: Secondary | ICD-10-CM | POA: Insufficient documentation

## 2024-01-03 DIAGNOSIS — R110 Nausea: Secondary | ICD-10-CM

## 2024-01-03 DIAGNOSIS — G4733 Obstructive sleep apnea (adult) (pediatric): Secondary | ICD-10-CM | POA: Diagnosis present

## 2024-01-03 DIAGNOSIS — Z6835 Body mass index (BMI) 35.0-35.9, adult: Secondary | ICD-10-CM | POA: Diagnosis not present

## 2024-01-03 LAB — URINALYSIS, ROUTINE W REFLEX MICROSCOPIC
Bacteria, UA: NONE SEEN
Bilirubin Urine: NEGATIVE
Glucose, UA: NEGATIVE mg/dL
Hgb urine dipstick: NEGATIVE
Ketones, ur: 5 mg/dL — AB
Nitrite: NEGATIVE
Protein, ur: 30 mg/dL — AB
Specific Gravity, Urine: 1.033 — ABNORMAL HIGH (ref 1.005–1.030)
Squamous Epithelial / HPF: 0 /[HPF] (ref 0–5)
pH: 5 (ref 5.0–8.0)

## 2024-01-03 LAB — CBC WITH DIFFERENTIAL/PLATELET
Abs Immature Granulocytes: 0.05 10*3/uL (ref 0.00–0.07)
Basophils Absolute: 0.1 10*3/uL (ref 0.0–0.1)
Basophils Relative: 1 %
Eosinophils Absolute: 0 10*3/uL (ref 0.0–0.5)
Eosinophils Relative: 0 %
HCT: 32.7 % — ABNORMAL LOW (ref 36.0–46.0)
Hemoglobin: 9.9 g/dL — ABNORMAL LOW (ref 12.0–15.0)
Immature Granulocytes: 0 %
Lymphocytes Relative: 9 %
Lymphs Abs: 1 10*3/uL (ref 0.7–4.0)
MCH: 28.7 pg (ref 26.0–34.0)
MCHC: 30.3 g/dL (ref 30.0–36.0)
MCV: 94.8 fL (ref 80.0–100.0)
Monocytes Absolute: 0.4 10*3/uL (ref 0.1–1.0)
Monocytes Relative: 4 %
Neutro Abs: 10 10*3/uL — ABNORMAL HIGH (ref 1.7–7.7)
Neutrophils Relative %: 86 %
Platelets: 416 10*3/uL — ABNORMAL HIGH (ref 150–400)
RBC: 3.45 MIL/uL — ABNORMAL LOW (ref 3.87–5.11)
RDW: 13.7 % (ref 11.5–15.5)
WBC: 11.6 10*3/uL — ABNORMAL HIGH (ref 4.0–10.5)
nRBC: 0 % (ref 0.0–0.2)

## 2024-01-03 LAB — TROPONIN I (HIGH SENSITIVITY)
Troponin I (High Sensitivity): 9 ng/L (ref <18)
Troponin I (High Sensitivity): 8 ng/L (ref <18)

## 2024-01-03 LAB — COMPREHENSIVE METABOLIC PANEL
ALT: 10 U/L (ref 0–44)
AST: 20 U/L (ref 15–41)
Albumin: 3.7 g/dL (ref 3.5–5.0)
Alkaline Phosphatase: 70 U/L (ref 38–126)
Anion gap: 14 (ref 5–15)
BUN: 29 mg/dL — ABNORMAL HIGH (ref 8–23)
CO2: 19 mmol/L — ABNORMAL LOW (ref 22–32)
Calcium: 8.6 mg/dL — ABNORMAL LOW (ref 8.9–10.3)
Chloride: 105 mmol/L (ref 98–111)
Creatinine, Ser: 1.46 mg/dL — ABNORMAL HIGH (ref 0.44–1.00)
GFR, Estimated: 39 mL/min — ABNORMAL LOW (ref >60)
Glucose, Bld: 211 mg/dL — ABNORMAL HIGH (ref 70–99)
Potassium: 4.2 mmol/L (ref 3.5–5.1)
Sodium: 138 mmol/L (ref 135–145)
Total Bilirubin: 0.7 mg/dL (ref 0.0–1.2)
Total Protein: 6.8 g/dL (ref 6.5–8.1)

## 2024-01-03 LAB — LIPASE, BLOOD: Lipase: 46 U/L (ref 11–51)

## 2024-01-03 MED ORDER — SODIUM CHLORIDE 0.9 % IV SOLN: INTRAVENOUS | Status: AC

## 2024-01-03 MED ORDER — ACETAMINOPHEN 650 MG RE SUPP: 650.0000 mg | Freq: Four times a day (QID) | RECTAL | Status: DC | PRN

## 2024-01-03 MED ORDER — TOPIRAMATE 25 MG PO TABS: 50.0000 mg | ORAL_TABLET | Freq: Two times a day (BID) | ORAL | Status: DC

## 2024-01-03 MED ORDER — VITAMIN D (ERGOCALCIFEROL) 1.25 MG (50000 UNIT) PO CAPS
50000.0000 [IU] | ORAL_CAPSULE | ORAL | Status: DC
  Administered 2024-01-05: 50000 [IU] via ORAL
  Filled 2024-01-03: qty 1

## 2024-01-03 MED ORDER — ENOXAPARIN SODIUM 60 MG/0.6ML IJ SOSY
45.0000 mg | PREFILLED_SYRINGE | INTRAMUSCULAR | Status: DC
  Administered 2024-01-03: 45 mg via SUBCUTANEOUS
  Filled 2024-01-03: qty 0.6

## 2024-01-03 MED ORDER — TRAZODONE HCL 50 MG PO TABS
25.0000 mg | ORAL_TABLET | Freq: Every evening | ORAL | Status: DC | PRN
  Administered 2024-01-05 – 2024-01-06 (×2): 25 mg via ORAL
  Filled 2024-01-03 (×2): qty 1

## 2024-01-03 MED ORDER — FAMOTIDINE IN NACL 20-0.9 MG/50ML-% IV SOLN
20.0000 mg | Freq: Once | INTRAVENOUS | Status: AC
  Administered 2024-01-03: 20 mg via INTRAVENOUS
  Filled 2024-01-03: qty 50

## 2024-01-03 MED ORDER — PANTOPRAZOLE SODIUM 40 MG IV SOLR
40.0000 mg | Freq: Two times a day (BID) | INTRAVENOUS | Status: DC
Start: 2024-01-03 — End: 2024-01-07
  Administered 2024-01-03 – 2024-01-06 (×8): 40 mg via INTRAVENOUS
  Filled 2024-01-03 (×8): qty 10

## 2024-01-03 MED ORDER — HYDROMORPHONE HCL 1 MG/ML IJ SOLN
0.5000 mg | Freq: Once | INTRAMUSCULAR | Status: AC
  Administered 2024-01-03: 0.5 mg via INTRAVENOUS
  Filled 2024-01-03: qty 0.5

## 2024-01-03 MED ORDER — BENZOCAINE 20 % MT SOLN
1.0000 | Freq: Once | OROMUCOSAL | Status: AC
  Administered 2024-01-03: 1 via OROMUCOSAL
  Filled 2024-01-03: qty 1

## 2024-01-03 MED ORDER — ONDANSETRON HCL 4 MG/2ML IJ SOLN
4.0000 mg | Freq: Four times a day (QID) | INTRAMUSCULAR | Status: DC | PRN
Start: 2024-01-03 — End: 2024-01-07
  Administered 2024-01-03 – 2024-01-04 (×3): 4 mg via INTRAVENOUS
  Filled 2024-01-03 (×3): qty 2

## 2024-01-03 MED ORDER — ACETAMINOPHEN 325 MG PO TABS
650.0000 mg | ORAL_TABLET | Freq: Four times a day (QID) | ORAL | Status: DC | PRN
  Administered 2024-01-04 – 2024-01-06 (×5): 650 mg via ORAL
  Filled 2024-01-03 (×5): qty 2

## 2024-01-03 MED ORDER — ONDANSETRON HCL 4 MG PO TABS: 4.0000 mg | ORAL_TABLET | Freq: Four times a day (QID) | ORAL | Status: DC | PRN

## 2024-01-03 MED ORDER — BENZOCAINE 20 % MT AERO
1.0000 | INHALATION_SPRAY | Freq: Once | OROMUCOSAL | Status: DC
  Filled 2024-01-03: qty 57

## 2024-01-03 MED ORDER — HYDROCODONE-ACETAMINOPHEN 5-325 MG PO TABS: 1.0000 | ORAL_TABLET | Freq: Four times a day (QID) | ORAL | Status: DC | PRN

## 2024-01-03 MED ORDER — MAGNESIUM HYDROXIDE 400 MG/5ML PO SUSP
30.0000 mL | Freq: Every day | ORAL | Status: DC | PRN
Start: 2024-01-03 — End: 2024-01-07

## 2024-01-03 MED ORDER — IOHEXOL 300 MG/ML  SOLN
80.0000 mL | Freq: Once | INTRAMUSCULAR | Status: AC | PRN
  Administered 2024-01-03: 80 mL via INTRAVENOUS

## 2024-01-03 MED ORDER — BUPROPION HCL ER (XL) 150 MG PO TB24: 300.0000 mg | ORAL_TABLET | Freq: Every day | ORAL | Status: DC

## 2024-01-03 MED ORDER — GABAPENTIN 300 MG PO CAPS
300.0000 mg | ORAL_CAPSULE | Freq: Every evening | ORAL | Status: DC | PRN
  Administered 2024-01-04 – 2024-01-06 (×2): 300 mg via ORAL
  Filled 2024-01-03 (×2): qty 1

## 2024-01-03 MED ORDER — ONDANSETRON HCL 4 MG/2ML IJ SOLN
4.0000 mg | Freq: Once | INTRAMUSCULAR | Status: AC
  Administered 2024-01-03: 4 mg via INTRAVENOUS
  Filled 2024-01-03: qty 2

## 2024-01-03 MED ORDER — SODIUM CHLORIDE 0.9 % IV BOLUS
1000.0000 mL | Freq: Once | INTRAVENOUS | Status: AC
  Administered 2024-01-03: 1000 mL via INTRAVENOUS

## 2024-01-03 MED ORDER — OMEPRAZOLE MAGNESIUM 20 MG PO TBEC: 20.0000 mg | DELAYED_RELEASE_TABLET | Freq: Every day | ORAL | Status: DC | PRN

## 2024-01-03 NOTE — ED Triage Notes (Signed)
 Pt BIB [facility] from home c/o R/LUQ abd pain since 2000 10/10 and "getting worse." CBG=256 for EMS, pt states not diabetic, taking Ozempic. +N,-v/d, c/o constipation.Hx chole 68M ago

## 2024-01-03 NOTE — Assessment & Plan Note (Addendum)
Improved.  Patient had a bowel movement.  Continue clear liquid diet.

## 2024-01-03 NOTE — Progress Notes (Signed)
 PROGRESS NOTE    [name]  FMW:[id number] DOB: 1956-05-17 DOA: 01/03/2024 PCP: Lenon Layman ORN, MD  Chief Complaint  Patient presents with   Abdominal Pain    Hospital Course:  [name] is 68 y.o. female with osteoarthritis, depression, obesity status post Roux-en-Y gastric bypass, prior hysterectomy, prior cholecystectomy, prior hernia repair, who presents to the ED complaining of abdominal pain.  Patient also reported inability to eat due to nausea/vomiting, generalized weakness, vomiting.  She did report recently increasing her dose of Ozempic from 0.25-0.5. In the ED she was found to be hemodynamically stable, creatinine 1.46 which appears close to baseline.  WBC revealed mild leukocytosis of 11.6 with neutrophilia and anemia with hemoglobin of 9.9, thrombocytosis of 416.  CT scan revealed history of gastric bypass, no evidence of bowel obstruction.  It did also revealed distended excluded stomach with hyperdense debris.  Subjective: Patient this morning patient reports that she is still experiencing abdominal pain.  She denies any hunger.  NG tube with minimal output   Objective: Vitals:   01/03/24 0400 01/03/24 0500 01/03/24 0600 01/03/24 0700  BP:  133/85 119/80 122/85  Pulse:  95 97 93  Resp: 17 16 (!) 21 20  Temp:      TempSrc:      SpO2:  100% 99% 100%  Weight:      Height:        Intake/Output Summary (Last 24 hours) at 01/03/2024 0846 Last data filed at 01/03/2024 9177 Gross per 24 hour  Intake 1000 ml  Output --  Net 1000 ml   Filed Weights   01/03/24 0210  Weight: 91.6 kg    Examination: General exam: Appears calm and comfortable, NAD  Respiratory system: No work of breathing, symmetric chest wall expansion Cardiovascular system: S1 & S2 heard, RRR.  Gastrointestinal system: Abdomen diffusely tender to palpation, nondistended, no bruising or ecchymosis, midline surgical scar Neuro: Alert and oriented. No focal neurological  deficits. Extremities: Symmetric, expected ROM Skin: No rashes, lesions Psychiatry: Demonstrates appropriate judgement and insight. Mood & affect appropriate for situation.   Assessment & Plan:  Principal Problem:   Ileus (HCC) Active Problems:   Dehydration   Dyslipidemia   Depression   GERD without esophagitis   Stage 3b chronic kidney disease (CKD) (HCC)    Ileus distended excluded stomach - In setting of multiple prior abdominal surgeries including hysterectomy, cholecystectomy, Roux-en-Y gastric bypass, hernia repair - Complicated by distended excluded stomach --Unclear cause of extended excluded stomach.  Could be secondary to gastroparesis prompted by Ozempic.  There is also concern for gastrogastric fistula as given the debris seen in the excluded stomach, though this could also be blood. -- Gastroenterology was consulted for possible endoscopic intervention however excluded stomach is likely out of the reach of endoscope (>100cm) If endoscopic evaluation becomes necessary patient will likely need to transfer to another facility for for double-balloon enteroscopy - For now we will proceed with observation and monitoring of ileus - Likely remove NG tube tomorrow as it does not appear to be providing clear benefit. - Continue IV hydration - Optimize electrolytes - NG tube in place  Dehydration -  anorexia prior to arrival - Continue with IV fluids - Trend CMP  Dyslipidemia - Continue statin  Depression - Resume home meds  CKD stage IIIb - Appears to be at baseline - Will continue to trend CMP - Avoid nephrotoxic meds - Renally dose when needed  GERD without esophagitis - Continue PPI therapy  History of cholecystectomy History of Roux-en-Y bypass History of hernia repair History of hysterectomy - High risk for SBO, no current sign of obstruction - Monitor electrolytes closely, high risk for malnutrition given history of Roux-en-Y bypass  OSA - CPAP at  night  DVT prophylaxis: lovenox    Code Status: Full Code Family Communication: Directly with patient Disposition:  Status is: Inpatient    Consultants:  General Surgery: Dr Tye   Treatment Team:  Consulting Physician: Jinny Carmine, MD  Procedures:    Antimicrobials:  Anti-infectives (From admission, onward)    None       Data Reviewed: I have personally reviewed following labs and imaging studies CBC: Recent Labs  Lab 01/03/24 0203  WBC 11.6*  NEUTROABS 10.0*  HGB 9.9*  HCT 32.7*  MCV 94.8  PLT 416*   Basic Metabolic Panel: Recent Labs  Lab 01/03/24 0203  NA 138  K 4.2  CL 105  CO2 19*  GLUCOSE 211*  BUN 29*  CREATININE 1.46*  CALCIUM 8.6*   GFR: Estimated Creatinine Clearance: 40.2 mL/min (A) (by C-G formula based on SCr of 1.46 mg/dL (H)). Liver Function Tests: Recent Labs  Lab 01/03/24 0203  AST 20  ALT 10  ALKPHOS 70  BILITOT 0.7  PROT 6.8  ALBUMIN 3.7   CBG: No results for input(s): GLUCAP in the last 168 hours.  No results found for this or any previous visit (from the past 240 hours).   Radiology Studies: DG Abd 2 Views Result Date: 01/03/2024 CLINICAL DATA:  68 year old female with abdominal pain, enteric tube placement. EXAM: ABDOMEN - 2 VIEW COMPARISON:  CT Abdomen and Pelvis 0333 hours today. FINDINGS: Portable AP upright view of the chest at 0557 hours. Supine views of the abdomen at 0602 hours. Enteric tube has been placed into the stomach, side hole at the level of the gastric fundus. Underlying gastric bypass changes better demonstrated by CT. Stable bowel gas pattern since that time. Stable cholecystectomy clips. Excreted IV contrast in the urinary bladder. Lungs and mediastinal contours appear stable, no confluent pulmonary opacity. Stable visualized osseous structures. IMPRESSION: 1. Satisfactory enteric tube placement into the stomach. 2. Underlying gastric bypass changes as demonstrated by CT. Stable bowel gas pattern. 3. No  acute cardiopulmonary abnormality. Electronically Signed   By: VEAR Hurst M.D.   On: 01/03/2024 06:19   CT ABDOMEN PELVIS W CONTRAST Result Date: 01/03/2024 CLINICAL DATA:  Upper abdominal pain, nausea EXAM: CT ABDOMEN AND PELVIS WITH CONTRAST TECHNIQUE: Multidetector CT imaging of the abdomen and pelvis was performed using the standard protocol following bolus administration of intravenous contrast. RADIATION DOSE REDUCTION: This exam was performed according to the departmental dose-optimization program which includes automated exposure control, adjustment of the mA and/or kV according to patient size and/or use of iterative reconstruction technique. CONTRAST:  80mL OMNIPAQUE  IOHEXOL  300 MG/ML  SOLN COMPARISON:  12/13/2014 FINDINGS: Lower chest: Mild compressive atelectasis in the medial bilateral lower lobes. Hepatobiliary: Liver is within normal limits. Status post cholecystectomy. No intrahepatic or extrahepatic dilatation. Pancreas: Within normal limits. Spleen: Within normal limits. Adrenals/Urinary Tract: Adrenal glands are within normal limits. Right kidney is within normal limits. Two left upper pole renal cysts measuring up to 5.0 cm (series 2/image 30), measuring simple fluid density, benign (Bosniak I). No follow-up is recommended. No hydronephrosis. Bladder is within normal limits. Stomach/Bowel: Status post gastric bypass. Distended excluded stomach with hyperdense debris. No evidence of bowel obstruction. Normal appendix (series 2/image 55). Left colonic diverticulosis, without evidence of  diverticulitis. Vascular/Lymphatic: No evidence of abdominal aortic aneurysm. No suspicious abdominopelvic lymphadenopathy. Reproductive: Status post hysterectomy. No adnexal masses. Other: Small volume upper abdominal ascites. Musculoskeletal: Degenerative changes of the visualized thoracolumbar spine. Grade 2 spondylolisthesis at L5-S1. IMPRESSION: Status post gastric bypass.  No evidence of bowel obstruction.  Distended excluded stomach with hyperdense debris. Consider endoscopic evaluation of the excluded stomach, as clinically warranted. Small volume upper abdominal ascites. Additional ancillary findings as above. Electronically Signed   By: Pinkie Pebbles M.D.   On: 01/03/2024 03:51   DG Chest Port 1 View Result Date: 01/03/2024 CLINICAL DATA:  Abdominal pain. EXAM: PORTABLE CHEST 1 VIEW COMPARISON:  None Available. FINDINGS: The heart size and mediastinal contours are within normal limits. Both lungs are clear. A radiopaque fusion plate and screws are seen overlying the cervical spine. Multilevel degenerative changes are noted throughout the thoracic spine. IMPRESSION: No active cardiopulmonary disease. Electronically Signed   By: Suzen Dials M.D.   On: 01/03/2024 02:39    Scheduled Meds:  enoxaparin  (LOVENOX ) injection  45 mg Subcutaneous Q24H   pantoprazole  (PROTONIX ) IV  40 mg Intravenous Q12H   Continuous Infusions:  sodium chloride  100 mL/hr at 01/03/24 0604     LOS: 0 days    Time spent:  55min  Raoul Ciano, DO [facility]  To contact the attending physician between 7A-7P please use Epic Chat. To contact the covering physician during after hours 7P-7A, please review Amion.   01/03/2024, 8:46 AM   *This document has been created with the assistance of dictation software. Please excuse typographical errors. *

## 2024-01-03 NOTE — ED Provider Notes (Signed)
 [facility] Provider Note    Event Date/Time   First MD Initiated Contact with Patient 01/03/24 [phone]     (approximate)   History   Abdominal Pain   HPI  [name] is a 68 y.o. female to the ED via EMS from home with a chief complaint of generalized, maximally burning, radiating upper abdominal pain starting 2 days ago but increased in intensity around 8 PM and worsening.  Endorses associated nausea.  Surgical history of Roux-en-Y gastric bypass, hernia, hysterectomy, cholecystectomy.  Denies associated fever/chills, chest pain, shortness of breath, vomiting, dysuria.     Past Medical History   Past Medical History:  Diagnosis Date   Arthritis    Chicken pox    Depression    Heartburn    History of blood transfusion    History of hemorrhoids    History of kidney stones    IDA (iron  deficiency anemia)    Kidney stones    Morbid obesity (HCC)    s/p Gastric bypass     Active Problem List   Patient Active Problem List   Diagnosis Date Noted   Ileus (HCC) 01/03/2024   Dehydration 01/03/2024   Dyslipidemia 01/03/2024   Depression 01/03/2024   GERD without esophagitis 01/03/2024   Stage 3b chronic kidney disease (CKD) (HCC) 01/03/2024   Esophageal dysphagia 10/19/2017   Encounter for screening colonoscopy 10/19/2017   Osteoarthritis of patellofemoral joint 05/04/2017   Other iron  deficiency anemia 07/24/2016   S/P gastric bypass 06/11/2016   Morbid obesity with BMI of 45.0-49.9, adult (HCC) 06/11/2016   Anxiety and depression 06/11/2016   Encounter for preventative adult health care exam with abnormal findings 06/11/2016     Past Surgical History   Past Surgical History:  Procedure Laterality Date   ABDOMINAL HYSTERECTOMY  1997   CERVICAL SPINE SURGERY  2009   Fusion   CESAREAN SECTION  1987   CHOLECYSTECTOMY     COLONOSCOPY  04/22/2007   COLONOSCOPY WITH PROPOFOL  N/A 11/30/2017   Procedure: COLONOSCOPY WITH PROPOFOL ;   Surgeon: Dessa Reyes ORN, MD;  Location: [facility] ENDOSCOPY;  Service: Endoscopy;  Laterality: N/A;   ESOPHAGOGASTRODUODENOSCOPY (EGD) WITH PROPOFOL  N/A 11/30/2017   Procedure: ESOPHAGOGASTRODUODENOSCOPY (EGD) WITH PROPOFOL ;  Surgeon: Dessa Reyes ORN, MD;  Location: [facility] ENDOSCOPY;  Service: Endoscopy;  Laterality: N/A;   GASTRIC BYPASS  2007   HERNIA REPAIR  2011   KNEE ARTHROSCOPY WITH MEDIAL MENISECTOMY Right 01/15/2022   Procedure: Right knee partial medial and lateral meniscectomies and loose body removal;  Surgeon: Kathlynn Sharper, MD;  Location: [facility] ORS;  Service: Orthopedics;  Laterality: Right;   OOPHORECTOMY     TONSILLECTOMY AND ADENOIDECTOMY     vulvar cysts     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     Home Medications   Prior to Admission medications   Medication Sig Start Date End Date Taking? Authorizing Provider  buPROPion  (WELLBUTRIN  XL) 300 MG 24 hr tablet Take 300 mg by mouth daily. 12/24/21   [provider]  gabapentin  (NEURONTIN ) 300 MG capsule Take 300 mg by mouth at bedtime as needed (pain). 12/10/21   [provider]  HYDROcodone -acetaminophen  (NORCO) 5-325 MG tablet Take 1 tablet by mouth every 6 (six) hours as needed for moderate pain. 01/15/22   Kathlynn Sharper, MD  omeprazole  (PRILOSEC  OTC) 20 MG tablet Take 20 mg by mouth daily as needed (acid reflux).    [provider]  topiramate  (TOPAMAX ) 50 MG tablet Take 50 mg  by mouth 2 (two) times daily. 12/24/21   [provider]     Allergies  Keflex [cephalexin], Penicillins, and Sulfa antibiotics   Family History   Family History  Problem Relation Age of Onset   Arthritis Mother    Arthritis Father    Lung cancer Father    Arthritis Maternal Grandmother    Kidney disease Maternal Grandmother    Depression Maternal Grandmother    Breast cancer Sister 89       three times   Colon cancer Sister 20     Physical Exam  Triage Vital Signs: ED Triage Vitals   Encounter Vitals Group     BP      Systolic BP Percentile      Diastolic BP Percentile      Pulse      Resp      Temp      Temp src      SpO2      Weight      Height      Head Circumference      Peak Flow      Pain Score      Pain Loc      Pain Education      Exclude from Growth Chart     Updated Vital Signs: BP 119/80   Pulse 97   Temp 97.6 F (36.4 C) (Oral)   Resp (!) 21   Ht 5' 3 (1.6 m)   Wt 91.6 kg   SpO2 99%   BMI 35.78 kg/m    General: Awake, moderate distress.  CV:  RRR.  Good peripheral perfusion.  Resp:  Normal effort.  CTAB. Abd:  Moderate diffuse tenderness to palpation without rebound or guarding.  No distention.  Other:  No truncal vesicles.   ED Results / Procedures / Treatments  Labs (all labs ordered are listed, but only abnormal results are displayed) Labs Reviewed  CBC WITH DIFFERENTIAL/PLATELET - Abnormal; Notable for the following components:      Result Value   WBC 11.6 (*)    RBC 3.45 (*)    Hemoglobin 9.9 (*)    HCT 32.7 (*)    Platelets 416 (*)    Neutro Abs 10.0 (*)    All other components within normal limits  COMPREHENSIVE METABOLIC PANEL - Abnormal; Notable for the following components:   CO2 19 (*)    Glucose, Bld 211 (*)    BUN 29 (*)    Creatinine, Ser 1.46 (*)    Calcium 8.6 (*)    GFR, Estimated 39 (*)    All other components within normal limits  URINALYSIS, ROUTINE W REFLEX MICROSCOPIC - Abnormal; Notable for the following components:   Color, Urine YELLOW (*)    APPearance HAZY (*)    Specific Gravity, Urine 1.033 (*)    Ketones, ur 5 (*)    Protein, ur 30 (*)    Leukocytes,Ua SMALL (*)    All other components within normal limits  LIPASE, BLOOD  HIV ANTIBODY (ROUTINE TESTING W REFLEX)  TROPONIN I (HIGH SENSITIVITY)  TROPONIN I (HIGH SENSITIVITY)     EKG  ED ECG REPORT I, Ezme Duch J, the attending physician, personally viewed and interpreted this ECG.   Date: 01/03/2024  EKG Time: 0201  Rate:  89  Rhythm: normal sinus rhythm  Axis: Normal  Intervals:none  ST&T Change: Nonspecific    RADIOLOGY I have independently visualized interpreted patient's imaging studies as well as noted the radiology interpretation:  CT abdomen/pelvis: Distended stomach with hyperdense debris  Chest x-ray: No acute cardiopulmonary process  KUB: Satisfactory NG tube placement  Official radiology report(s): DG Abd 2 Views Result Date: 01/03/2024 CLINICAL DATA:  68 year old female with abdominal pain, enteric tube placement. EXAM: ABDOMEN - 2 VIEW COMPARISON:  CT Abdomen and Pelvis 0333 hours today. FINDINGS: Portable AP upright view of the chest at 0557 hours. Supine views of the abdomen at 0602 hours. Enteric tube has been placed into the stomach, side hole at the level of the gastric fundus. Underlying gastric bypass changes better demonstrated by CT. Stable bowel gas pattern since that time. Stable cholecystectomy clips. Excreted IV contrast in the urinary bladder. Lungs and mediastinal contours appear stable, no confluent pulmonary opacity. Stable visualized osseous structures. IMPRESSION: 1. Satisfactory enteric tube placement into the stomach. 2. Underlying gastric bypass changes as demonstrated by CT. Stable bowel gas pattern. 3. No acute cardiopulmonary abnormality. Electronically Signed   By: VEAR Hurst M.D.   On: 01/03/2024 06:19   CT ABDOMEN PELVIS W CONTRAST Result Date: 01/03/2024 CLINICAL DATA:  Upper abdominal pain, nausea EXAM: CT ABDOMEN AND PELVIS WITH CONTRAST TECHNIQUE: Multidetector CT imaging of the abdomen and pelvis was performed using the standard protocol following bolus administration of intravenous contrast. RADIATION DOSE REDUCTION: This exam was performed according to the departmental dose-optimization program which includes automated exposure control, adjustment of the mA and/or kV according to patient size and/or use of iterative reconstruction technique. CONTRAST:  80mL OMNIPAQUE   IOHEXOL  300 MG/ML  SOLN COMPARISON:  12/13/2014 FINDINGS: Lower chest: Mild compressive atelectasis in the medial bilateral lower lobes. Hepatobiliary: Liver is within normal limits. Status post cholecystectomy. No intrahepatic or extrahepatic dilatation. Pancreas: Within normal limits. Spleen: Within normal limits. Adrenals/Urinary Tract: Adrenal glands are within normal limits. Right kidney is within normal limits. Two left upper pole renal cysts measuring up to 5.0 cm (series 2/image 30), measuring simple fluid density, benign (Bosniak I). No follow-up is recommended. No hydronephrosis. Bladder is within normal limits. Stomach/Bowel: Status post gastric bypass. Distended excluded stomach with hyperdense debris. No evidence of bowel obstruction. Normal appendix (series 2/image 55). Left colonic diverticulosis, without evidence of diverticulitis. Vascular/Lymphatic: No evidence of abdominal aortic aneurysm. No suspicious abdominopelvic lymphadenopathy. Reproductive: Status post hysterectomy. No adnexal masses. Other: Small volume upper abdominal ascites. Musculoskeletal: Degenerative changes of the visualized thoracolumbar spine. Grade 2 spondylolisthesis at L5-S1. IMPRESSION: Status post gastric bypass.  No evidence of bowel obstruction. Distended excluded stomach with hyperdense debris. Consider endoscopic evaluation of the excluded stomach, as clinically warranted. Small volume upper abdominal ascites. Additional ancillary findings as above. Electronically Signed   By: Pinkie Pebbles M.D.   On: 01/03/2024 03:51   DG Chest Port 1 View Result Date: 01/03/2024 CLINICAL DATA:  Abdominal pain. EXAM: PORTABLE CHEST 1 VIEW COMPARISON:  None Available. FINDINGS: The heart size and mediastinal contours are within normal limits. Both lungs are clear. A radiopaque fusion plate and screws are seen overlying the cervical spine. Multilevel degenerative changes are noted throughout the thoracic spine. IMPRESSION: No  active cardiopulmonary disease. Electronically Signed   By: Suzen Dials M.D.   On: 01/03/2024 02:39     PROCEDURES:  Critical Care performed: Yes, see critical care procedure note(s)  CRITICAL CARE Performed by: ROBINETTE VERMELL PARAS   Total critical care time: 45 minutes  Critical care time was exclusive of separately billable procedures and treating other patients.  Critical care was necessary to treat or prevent imminent or life-threatening deterioration.  Critical care  was time spent personally by me on the following activities: development of treatment plan with patient and/or surrogate as well as nursing, discussions with consultants, evaluation of patient's response to treatment, examination of patient, obtaining history from patient or surrogate, ordering and performing treatments and interventions, ordering and review of laboratory studies, ordering and review of radiographic studies, pulse oximetry and re-evaluation of patient's condition.   SABRA1-3 Lead EKG Interpretation  Performed by: Robinette Vermell PARAS, MD Authorized by: Robinette Vermell PARAS, MD     Interpretation: normal     ECG rate:  90   ECG rate assessment: normal     Rhythm: sinus rhythm     Ectopy: none     Conduction: normal   Comments:     Patient placed on cardiac monitor to evaluate for arrhythmias    MEDICATIONS ORDERED IN ED: Medications  HYDROcodone -acetaminophen  (NORCO/VICODIN) 5-325 MG per tablet 1 tablet (has no administration in time range)  buPROPion  (WELLBUTRIN  XL) 24 hr tablet 300 mg (has no administration in time range)  omeprazole  (PRILOSEC  OTC) EC tablet 20 mg (has no administration in time range)  gabapentin  (NEURONTIN ) capsule 300 mg (has no administration in time range)  topiramate  (TOPAMAX ) tablet 50 mg (has no administration in time range)  enoxaparin  (LOVENOX ) injection 45 mg (has no administration in time range)  0.9 %  sodium chloride  infusion ( Intravenous New Bag/Given 01/03/24 0604)   acetaminophen  (TYLENOL ) tablet 650 mg (has no administration in time range)    Or  acetaminophen  (TYLENOL ) suppository 650 mg (has no administration in time range)  traZODone  (DESYREL ) tablet 25 mg (has no administration in time range)  magnesium  hydroxide (MILK OF MAGNESIA) suspension 30 mL (has no administration in time range)  ondansetron  (ZOFRAN ) tablet 4 mg (has no administration in time range)    Or  ondansetron  (ZOFRAN ) injection 4 mg (has no administration in time range)  sodium chloride  0.9 % bolus 1,000 mL (1,000 mLs Intravenous New Bag/Given 01/03/24 0248)  ondansetron  (ZOFRAN ) injection 4 mg (4 mg Intravenous Given 01/03/24 0249)  HYDROmorphone  (DILAUDID ) injection 0.5 mg (0.5 mg Intravenous Given 01/03/24 0249)  famotidine  (PEPCID ) IVPB 20 mg premix (0 mg Intravenous Stopped 01/03/24 0442)  iohexol  (OMNIPAQUE ) 300 MG/ML solution 80 mL (80 mLs Intravenous Contrast Given 01/03/24 0327)  HYDROmorphone  (DILAUDID ) injection 0.5 mg (0.5 mg Intravenous Given 01/03/24 0543)  ondansetron  (ZOFRAN ) injection 4 mg (4 mg Intravenous Given 01/03/24 0543)  benzocaine  (HURRICAINE) 20 % mouth spray 1 Application (1 Application Mouth/Throat Given 01/03/24 0543)     IMPRESSION / MDM / ASSESSMENT AND PLAN / ED COURSE  I reviewed the triage vital signs and the nursing notes.                             68 year old female presenting with abdominal pain and nausea. Differential diagnosis includes, but is not limited to, biliary disease (biliary colic, acute cholecystitis, cholangitis, choledocholithiasis, etc), intrathoracic causes for epigastric abdominal pain including ACS, gastritis, duodenitis, pancreatitis, small bowel or large bowel obstruction, abdominal aortic aneurysm, hernia, and ulcer(s).  I have personally reviewed patient's records and note a nephrology office visit on 10/10/2019 for for follow-up stage III chronic kidney disease.  Patient's presentation is most consistent with acute  presentation with potential threat to life or bodily function.  The patient is on the cardiac monitor to evaluate for evidence of arrhythmia and/or significant heart rate changes.  Will obtain lab work, UA.  Initiate IV  fluid hydration, IV Dilaudid  for pain, IV Zofran  for nausea.  Keep NPO.  Will reassess.  Clinical Course as of 01/03/24 0656  Mon Jan 03, 2024  0429 Updated patient and son on all laboratory and imaging results.  Patient reports constipation times several days.  Concern CT scan indicative of ileus/early developing SBO.  Patient agreeable to NG tube.  Will redosed medications and consult hospitalist services for evaluation and admission. [JS]    Clinical Course User Index [JS] Robinette Vermell PARAS, MD     FINAL CLINICAL IMPRESSION(S) / ED DIAGNOSES   Final diagnoses:  Generalized abdominal pain  Nausea  Ileus (HCC)     Rx / DC Orders   ED Discharge Orders     None        Note:  This document was prepared using Dragon voice recognition software and may include unintentional dictation errors.   Justo Hengel J, MD 01/03/24 [phone]

## 2024-01-03 NOTE — Progress Notes (Signed)
 PHARMACIST - PHYSICIAN COMMUNICATION  CONCERNING:  Enoxaparin  (Lovenox ) for DVT Prophylaxis    RECOMMENDATION: Patient was prescribed enoxaprin 40mg  q24 hours for VTE prophylaxis.   Filed Weights   01/03/24 0210  Weight: 91.6 kg (202 lb)    Body mass index is 35.78 kg/m.  Estimated Creatinine Clearance: 40.2 mL/min (A) (by C-G formula based on SCr of 1.46 mg/dL (H)).   Based on [facility] policy patient is candidate for enoxaparin  0.5mg /kg TBW SQ every 24 hours based on BMI being >30.  DESCRIPTION: Pharmacy has adjusted enoxaparin  dose per [facility] policy.  Patient is now receiving enoxaparin  0.5 mg/kg every 24 hours   Rankin CANDIE Dills, PharmD, [facility] 01/03/2024 5:52 AM

## 2024-01-03 NOTE — Assessment & Plan Note (Addendum)
Continue iv PPI

## 2024-01-03 NOTE — Consult Note (Signed)
 Helen Copping, MD [facility]  [address], [address] [place], [place] [address] Phone: [phone] Fax : [phone]  Consultation  Referring Provider:     Dr. Lawence Primary Care Physician:  Lenon Layman ORN, MD Primary Gastroenterologist: Sampson         Reason for Consultation:     Distended stomach  Date of Admission:  01/03/2024 Date of Consultation:  01/03/2024         HPI:   [name] is a 68 y.o. female who is status post gastric bypass with a Roux-en-Y who had a report of abdominal pain that started 2 days ago with increasing severity.  She reported that she was having some nausea after that. The patient reports that she had not been able to eat anything the evening prior to being admitted.  She also reports that she is not passing any gas or moving her bowels.  The patient has been on Ozempic which was started in November and increased in mid December.  The patient had a CT scan of the abdomen that showed:  IMPRESSION: Status post gastric bypass.  No evidence of bowel obstruction.   Distended excluded stomach with hyperdense debris. Consider endoscopic evaluation of the excluded stomach, as clinically warranted.   Small volume upper abdominal ascites.  The patient was also noted to have a hemoglobin of 9.9 on admission with a year ago having a hemoglobin of 10.5.  Her liver enzymes were normal.  The patient had an NG tube placed.  I was consulted for possible EGD due to the recommendation by radiology for endoscopic evaluation.  The patient had been followed by Dr. Dessa in the past for her colonoscopies.  In 2018 the patient was noted to have a endoscopy with an obvious gastric bypass at that time.  The patient had surgery consulted for her distended stomach.  The patient was also noted to have an increased white cell count at 11.6.  Past Medical History:  Diagnosis Date  . Arthritis   . Chicken pox   . Depression   . Heartburn   . History of blood  transfusion   . History of hemorrhoids   . History of kidney stones   . IDA (iron  deficiency anemia)   . Kidney stones   . Morbid obesity (HCC)    s/p Gastric bypass    Past Surgical History:  Procedure Laterality Date  . ABDOMINAL HYSTERECTOMY  1997  . CERVICAL SPINE SURGERY  2009   Fusion  . CESAREAN SECTION  1987  . CHOLECYSTECTOMY    . COLONOSCOPY  04/22/2007  . COLONOSCOPY WITH PROPOFOL  N/A 11/30/2017   Procedure: COLONOSCOPY WITH PROPOFOL ;  Surgeon: Dessa Reyes ORN, MD;  Location: [facility] ENDOSCOPY;  Service: Endoscopy;  Laterality: N/A;  . ESOPHAGOGASTRODUODENOSCOPY (EGD) WITH PROPOFOL  N/A 11/30/2017   Procedure: ESOPHAGOGASTRODUODENOSCOPY (EGD) WITH PROPOFOL ;  Surgeon: Dessa Reyes ORN, MD;  Location: [facility] ENDOSCOPY;  Service: Endoscopy;  Laterality: N/A;  . GASTRIC BYPASS  2007  . HERNIA REPAIR  2011  . KNEE ARTHROSCOPY WITH MEDIAL MENISECTOMY Right 01/15/2022   Procedure: Right knee partial medial and lateral meniscectomies and loose body removal;  Surgeon: Kathlynn Sharper, MD;  Location: [facility] ORS;  Service: Orthopedics;  Laterality: Right;  . OOPHORECTOMY    . TONSILLECTOMY AND ADENOIDECTOMY    . 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    Prior to Admission medications   Medication Sig Start Date End Date Taking?  Authorizing Provider  acetaminophen  (TYLENOL ) 500 MG tablet Take 500 mg by mouth every 6 (six) hours as needed.   Yes [provider]  ergocalciferol  (VITAMIN D2) 1.25 MG (50000 UT) capsule Take 50,000 Units by mouth once a week. 10/14/23 10/13/24 Yes [provider]  gabapentin  (NEURONTIN ) 300 MG capsule Take 300 mg by mouth at bedtime as needed (pain). 12/10/21  Yes [provider]  losartan (COZAAR) 25 MG tablet Take 25 mg by mouth daily. 03/05/23  Yes [provider]  omeprazole  (PRILOSEC  OTC) 20 MG tablet Take 20 mg by mouth daily as needed (acid reflux).   Yes [provider]   Semaglutide-Weight Management 0.25 MG/0.5ML SOAJ Inject 0.25 mg into the skin once a week. 11/09/23  Yes [provider]  buPROPion  (WELLBUTRIN  XL) 300 MG 24 hr tablet Take 300 mg by mouth daily. Patient not taking: Reported on 01/03/2024 12/24/21   [provider]  HYDROcodone -acetaminophen  (NORCO) 5-325 MG tablet Take 1 tablet by mouth every 6 (six) hours as needed for moderate pain. Patient not taking: Reported on 01/03/2024 01/15/22   Kathlynn Sharper, MD  topiramate  (TOPAMAX ) 50 MG tablet Take 50 mg by mouth 2 (two) times daily. Patient not taking: Reported on 01/03/2024 12/24/21   [provider]    Family History  Problem Relation Age of Onset  . Arthritis Mother   . Arthritis Father   . Lung cancer Father   . Arthritis Maternal Grandmother   . Kidney disease Maternal Grandmother   . Depression Maternal Grandmother   . Breast cancer Sister 23       three times  . Colon cancer Sister 84     Social History   Tobacco Use  . Smoking status: Never  . Smokeless tobacco: Never  Substance Use Topics  . Alcohol use: Yes    Alcohol/week: 0.0 - 1.0 standard drinks of alcohol  . Drug use: No    Allergies as of 01/03/2024 - Review Complete 01/03/2024  Allergen Reaction Noted  . Keflex [cephalexin] Swelling 12/29/2021  . Penicillins Swelling 05/27/2015  . Sulfa antibiotics Swelling 12/29/2021    Review of Systems:    All systems reviewed and negative except where noted in HPI.   Physical Exam:  Vital signs in last 24 hours: Temp:  [97.6 F (36.4 C)] 97.6 F (36.4 C) (01/13 0208) Pulse Rate:  [48-97] 94 (01/13 1300) Resp:  [13-28] 14 (01/13 1300) BP: (119-133)/(78-98) 125/78 (01/13 1300) SpO2:  [90 %-100 %] 95 % (01/13 1300) Weight:  [91.6 kg] 91.6 kg (01/13 0210)   General:   Pleasant, cooperative in NAD Head:  Normocephalic and atraumatic. Eyes:   No icterus.   Conjunctiva pink. PERRLA. Ears:  Normal auditory acuity. Neck:  Supple; no masses or  thyroidomegaly Lungs: Respirations even and unlabored. Lungs clear to auscultation bilaterally.   No wheezes, crackles, or rhonchi.  Heart:  Regular rate and rhythm;  Without murmur, clicks, rubs or gallops Abdomen:  Soft, nondistended, diffusely tender.  Decreased bowel sounds. No appreciable masses or hepatomegaly.  No rebound or guarding.  Rectal:  Not performed. Msk:  Symmetrical without gross deformities.   Extremities:  Without edema, cyanosis or clubbing. Neurologic:  Alert and oriented x3;  grossly normal neurologically. Skin:  Intact without significant lesions or rashes. Cervical Nodes:  No significant cervical adenopathy. Psych:  Alert and cooperative. Normal affect.  LAB RESULTS: Recent Labs    01/03/24 0203  WBC 11.6*  HGB 9.9*  HCT 32.7*  PLT  416*   BMET Recent Labs    01/03/24 0203  NA 138  K 4.2  CL 105  CO2 19*  GLUCOSE 211*  BUN 29*  CREATININE 1.46*  CALCIUM 8.6*   LFT Recent Labs    01/03/24 0203  PROT 6.8  ALBUMIN 3.7  AST 20  ALT 10  ALKPHOS 70  BILITOT 0.7   PT/INR No results for input(s): LABPROT, INR in the last 72 hours.  STUDIES: DG Abd 2 Views Result Date: 01/03/2024 CLINICAL DATA:  69 year old female with abdominal pain, enteric tube placement. EXAM: ABDOMEN - 2 VIEW COMPARISON:  CT Abdomen and Pelvis 0333 hours today. FINDINGS: Portable AP upright view of the chest at 0557 hours. Supine views of the abdomen at 0602 hours. Enteric tube has been placed into the stomach, side hole at the level of the gastric fundus. Underlying gastric bypass changes better demonstrated by CT. Stable bowel gas pattern since that time. Stable cholecystectomy clips. Excreted IV contrast in the urinary bladder. Lungs and mediastinal contours appear stable, no confluent pulmonary opacity. Stable visualized osseous structures. IMPRESSION: 1. Satisfactory enteric tube placement into the stomach. 2. Underlying gastric bypass changes as demonstrated by CT.  Stable bowel gas pattern. 3. No acute cardiopulmonary abnormality. Electronically Signed   By: VEAR Hurst M.D.   On: 01/03/2024 06:19   CT ABDOMEN PELVIS W CONTRAST Result Date: 01/03/2024 CLINICAL DATA:  Upper abdominal pain, nausea EXAM: CT ABDOMEN AND PELVIS WITH CONTRAST TECHNIQUE: Multidetector CT imaging of the abdomen and pelvis was performed using the standard protocol following bolus administration of intravenous contrast. RADIATION DOSE REDUCTION: This exam was performed according to the departmental dose-optimization program which includes automated exposure control, adjustment of the mA and/or kV according to patient size and/or use of iterative reconstruction technique. CONTRAST:  80mL OMNIPAQUE  IOHEXOL  300 MG/ML  SOLN COMPARISON:  12/13/2014 FINDINGS: Lower chest: Mild compressive atelectasis in the medial bilateral lower lobes. Hepatobiliary: Liver is within normal limits. Status post cholecystectomy. No intrahepatic or extrahepatic dilatation. Pancreas: Within normal limits. Spleen: Within normal limits. Adrenals/Urinary Tract: Adrenal glands are within normal limits. Right kidney is within normal limits. Two left upper pole renal cysts measuring up to 5.0 cm (series 2/image 30), measuring simple fluid density, benign (Bosniak I). No follow-up is recommended. No hydronephrosis. Bladder is within normal limits. Stomach/Bowel: Status post gastric bypass. Distended excluded stomach with hyperdense debris. No evidence of bowel obstruction. Normal appendix (series 2/image 55). Left colonic diverticulosis, without evidence of diverticulitis. Vascular/Lymphatic: No evidence of abdominal aortic aneurysm. No suspicious abdominopelvic lymphadenopathy. Reproductive: Status post hysterectomy. No adnexal masses. Other: Small volume upper abdominal ascites. Musculoskeletal: Degenerative changes of the visualized thoracolumbar spine. Grade 2 spondylolisthesis at L5-S1. IMPRESSION: Status post gastric bypass.  No  evidence of bowel obstruction. Distended excluded stomach with hyperdense debris. Consider endoscopic evaluation of the excluded stomach, as clinically warranted. Small volume upper abdominal ascites. Additional ancillary findings as above. Electronically Signed   By: Pinkie Pebbles M.D.   On: 01/03/2024 03:51   DG Chest Port 1 View Result Date: 01/03/2024 CLINICAL DATA:  Abdominal pain. EXAM: PORTABLE CHEST 1 VIEW COMPARISON:  None Available. FINDINGS: The heart size and mediastinal contours are within normal limits. Both lungs are clear. A radiopaque fusion plate and screws are seen overlying the cervical spine. Multilevel degenerative changes are noted throughout the thoracic spine. IMPRESSION: No active cardiopulmonary disease. Electronically Signed   By: Suzen Dials M.D.   On: 01/03/2024 02:39  Impression / Plan:   Assessment: Principal Problem:   Ileus (HCC) Active Problems:   Dehydration   Dyslipidemia   Depression   GERD without esophagitis   Stage 3b chronic kidney disease (CKD) (HCC)   LARUEN RISSER is a 68 y.o. y/o female with an ileus on x-ray with a distended remnant stomach.  I was consulted for a possible upper endoscopy as it was recommended by radiology after reading the CT scan.  Plan:  The patient will not be set up for any endoscopic procedures to try to get to the stomach due to the fact that the Roux limb is likely over 100 cm and it is not feasible to get to the remnant stomach and a retrograde fashion.  The gastric contents as noted by surgery may be related to a fistula although blood can also appear similarly.  The patient now has an NG tube in place for decompression although it is unlikely to decompress the stomach.  I would recommend monitoring the patient's ileus but do not recommend any GI intervention at this time.  The patient and her son have been explained the plan and agree with it.  Thank you for involving me in the care of this patient.       LOS: 0 days   Helen Copping, MD, [facility] 01/03/2024, 2:41 PM,  Pager [phone] 7am-5pm  Check AMION for 5pm -7am coverage and on weekends   Note: This dictation was prepared with Dragon dictation along with smaller phrase technology. Any transcriptional errors that result from this process are unintentional.

## 2024-01-03 NOTE — ED Notes (Signed)
 Minimal discharge from the NG tube.

## 2024-01-03 NOTE — ED Notes (Signed)
 Attempted to obtain a CBG on the pt and pt fingers are cold to the touch and unable to allow enough blood for the strip. RN notified

## 2024-01-03 NOTE — Consult Note (Signed)
 Subjective:   CC: ileus?  HPI:  [name] is a 68 y.o. female who was consulted by Mansy for issue above.  Symptoms were first noted 1 day ago. Pain is sharp, confined to the epigastric area, without radiation.  Associated with nause, no emesis, exacerbated by nothing specific.  Last BM couple days ago.    Recent use of ozempic started about a month ago, no issues.     Past Medical History:  has a past medical history of Arthritis, Chicken pox, Depression, Heartburn, History of blood transfusion, History of hemorrhoids, History of kidney stones, IDA (iron  deficiency anemia), Kidney stones, and Morbid obesity (HCC).  Past Surgical History:  Past Surgical History:  Procedure Laterality Date   ABDOMINAL HYSTERECTOMY  1997   CERVICAL SPINE SURGERY  2009   Fusion   CESAREAN SECTION  1987   CHOLECYSTECTOMY     COLONOSCOPY  04/22/2007   COLONOSCOPY WITH PROPOFOL  N/A 11/30/2017   Procedure: COLONOSCOPY WITH PROPOFOL ;  Surgeon: Dessa Reyes ORN, MD;  Location: [facility] END[facility];  Service: Endoscopy;  Laterality: N/A;   ESOPHAGOGASTRODUODENOSCOPY (EGD) WITH PROPOFOL  N/A 11/30/2017   Procedure: ESOPHAGOGASTRODUODENOSCOPY (EGD) WITH PROPOFOL ;  Surgeon: Dessa Reyes ORN, MD;  Location: [facility] END[facility];  Service: Endoscopy;  Laterality: N/A;   GASTRIC BYPASS  2007   HERNIA REPAIR  2011   KNEE ARTHROSCOPY WITH MEDIAL MENISECTOMY Right 01/15/2022   Procedure: Right knee partial medial and lateral meniscectomies and loose body removal;  Surgeon: Kathlynn Sharper, MD;  Location: [facility] ORS;  Service: Orthopedics;  Laterality: Right;   OOPHORECTOMY     TONSILLECTOMY AND ADENOIDECTOMY     vulvar cysts     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    Family History: family history includes Arthritis in her father, maternal grandmother, and mother; Breast cancer (age of onset: 7) in her sister; Colon cancer (age of onset: 49) in her sister; Depression in her maternal grandmother; Kidney disease  in her maternal grandmother; Lung cancer in her father.  Social History:  reports that she has never smoked. She has never used smokeless tobacco. She reports current alcohol use. She reports that she does not use drugs.  Current Medications:  Prior to Admission medications   Medication Sig Start Date End Date Taking? Authorizing Provider  acetaminophen  (TYLENOL ) 500 MG tablet Take 500 mg by mouth every 6 (six) hours as needed.   Yes [provider]  ergocalciferol  (VITAMIN D2) 1.25 MG (50000 UT) capsule Take 50,000 Units by mouth once a week. 10/14/23 10/13/24 Yes [provider]  gabapentin  (NEURONTIN ) 300 MG capsule Take 300 mg by mouth at bedtime as needed (pain). 12/10/21  Yes [provider]  losartan (COZAAR) 25 MG tablet Take 25 mg by mouth daily. 03/05/23  Yes [provider]  omeprazole  (PRILOSEC  OTC) 20 MG tablet Take 20 mg by mouth daily as needed (acid reflux).   Yes [provider]  Semaglutide-Weight Management 0.25 MG/0.5ML SOAJ Inject 0.25 mg into the skin once a week. 11/09/23  Yes [provider]  buPROPion  (WELLBUTRIN  XL) 300 MG 24 hr tablet Take 300 mg by mouth daily. Patient not taking: Reported on 01/03/2024 12/24/21   [provider]  HYDROcodone -acetaminophen  (NORCO) 5-325 MG tablet Take 1 tablet by mouth every 6 (six) hours as needed for moderate pain. Patient not taking: Reported on 01/03/2024 01/15/22   Kathlynn Sharper, MD  topiramate  (TOPAMAX ) 50 MG tablet Take 50 mg by mouth 2 (two) times daily. Patient not taking: Reported on  01/03/2024 12/24/21   [provider]    Allergies:  Allergies as of 01/03/2024 - Review Complete 01/03/2024  Allergen Reaction Noted   Keflex [cephalexin] Swelling 12/29/2021   Penicillins Swelling 05/27/2015   Sulfa antibiotics Swelling 12/29/2021    ROS:  General: Denies weight loss, weight gain, fatigue, fevers, chills, and night sweats. Eyes: Denies blurry vision,  double vision, eye pain, itchy eyes, and tearing. Ears: Denies hearing loss, earache, and ringing in ears. Nose: Denies sinus pain, congestion, infections, runny nose, and nosebleeds. Mouth/throat: Denies hoarseness, sore throat, bleeding gums, and difficulty swallowing. Heart: Denies chest pain, palpitations, racing heart, irregular heartbeat, leg pain or swelling, and decreased activity tolerance. Respiratory: Denies breathing difficulty, shortness of breath, wheezing, cough, and sputum. GI: Denies change in appetite, heartburn, nausea, vomiting, constipation, diarrhea, and blood in stool. GU: Denies difficulty urinating, pain with urinating, urgency, frequency, blood in urine. Musculoskeletal: Denies joint stiffness, pain, swelling, muscle weakness. Skin: Denies rash, itching, mass, tumors, sores, and boils Neurologic: Denies headache, fainting, dizziness, seizures, numbness, and tingling. Psychiatric: Denies depression, anxiety, difficulty sleeping, and memory loss. Endocrine: Denies heat or cold intolerance, and increased thirst or urination. Blood/lymph: Denies easy bruising, easy bruising, and swollen glands     Objective:     BP 122/85   Pulse 93   Temp 97.6 F (36.4 C) (Oral)   Resp 20   Ht 5' 3 (1.6 m)   Wt 91.6 kg   SpO2 100%   BMI 35.78 kg/m   Constitutional :  alert, cooperative, appears stated age, and no distress  Lymphatics/Throat:  no asymmetry, masses, or scars  Respiratory:  clear to auscultation bilaterally  Cardiovascular:  regular rate and rhythm  Gastrointestinal: Soft, no guarding, TTP epigastric, LUQ area .   Musculoskeletal: Steady movement  Skin: Cool and moist  Psychiatric: Normal affect, non-agitated, not confused       LABS:     Latest Ref Rng & Units 01/03/2024    2:03 AM 01/07/2022    1:09 PM 12/17/2017    8:23 AM  CMP  Glucose 70 - 99 mg/dL 788  89  82   BUN 8 - 23 mg/dL 29  12  13    Creatinine 0.44 - 1.00 mg/dL 8.53  8.53  9.07    Sodium 135 - 145 mmol/L 138  139  145   Potassium 3.5 - 5.1 mmol/L 4.2  4.0  3.9   Chloride 98 - 111 mmol/L 105  112  107   CO2 22 - 32 mmol/L 19  22    Calcium 8.9 - 10.3 mg/dL 8.6  8.9  9.1   Total Protein 6.5 - 8.1 g/dL 6.8   6.5   Total Bilirubin 0.0 - 1.2 mg/dL 0.7   0.4   Alkaline Phos 38 - 126 U/L 70   73   AST 15 - 41 U/L 20   22   ALT 0 - 44 U/L 10   9       Latest Ref Rng & Units 01/03/2024    2:03 AM 01/07/2022    1:09 PM 12/17/2017    8:23 AM  CBC  WBC 4.0 - 10.5 K/uL 11.6  4.9  4.3   Hemoglobin 12.0 - 15.0 g/dL 9.9  89.4  86.9   Hematocrit 36.0 - 46.0 % 32.7  33.8  39.8   Platelets 150 - 400 K/uL 416  309  246     RADS: Narrative & Impression  CLINICAL DATA:  Upper abdominal  pain, nausea   EXAM: CT ABDOMEN AND PELVIS WITH CONTRAST   TECHNIQUE: Multidetector CT imaging of the abdomen and pelvis was performed using the standard protocol following bolus administration of intravenous contrast.   RADIATION DOSE REDUCTION: This exam was performed according to the departmental dose-optimization program which includes automated exposure control, adjustment of the mA and/or kV according to patient size and/or use of iterative reconstruction technique.   CONTRAST:  80mL OMNIPAQUE  IOHEXOL  300 MG/ML  SOLN   COMPARISON:  12/13/2014   FINDINGS: Lower chest: Mild compressive atelectasis in the medial bilateral lower lobes.   Hepatobiliary: Liver is within normal limits.   Status post cholecystectomy. No intrahepatic or extrahepatic dilatation.   Pancreas: Within normal limits.   Spleen: Within normal limits.   Adrenals/Urinary Tract: Adrenal glands are within normal limits.   Right kidney is within normal limits.   Two left upper pole renal cysts measuring up to 5.0 cm (series 2/image 30), measuring simple fluid density, benign (Bosniak I). No follow-up is recommended.   No hydronephrosis.   Bladder is within normal limits.   Stomach/Bowel: Status  post gastric bypass.   Distended excluded stomach with hyperdense debris.   No evidence of bowel obstruction.   Normal appendix (series 2/image 55).   Left colonic diverticulosis, without evidence of diverticulitis.   Vascular/Lymphatic: No evidence of abdominal aortic aneurysm.   No suspicious abdominopelvic lymphadenopathy.   Reproductive: Status post hysterectomy.   No adnexal masses.   Other: Small volume upper abdominal ascites.   Musculoskeletal: Degenerative changes of the visualized thoracolumbar spine. Grade 2 spondylolisthesis at L5-S1.   IMPRESSION: Status post gastric bypass.  No evidence of bowel obstruction.   Distended excluded stomach with hyperdense debris. Consider endoscopic evaluation of the excluded stomach, as clinically warranted.   Small volume upper abdominal ascites.   Additional ancillary findings as above.     Electronically Signed   By: Pinkie Pebbles M.D.   On: 01/03/2024 03:51     Assessment:   Abdominal pain, nausea.  CT report as noted above.  Plan:   Source of symptoms likely distended remnant stomach.  The distention extends through the stomach into the duodenum down to the enteral enteral anastomosis based on my read of the CT scan.  At this point, no concerns for possible strictures is causing this distention, source could possibly be from recent Ozempic use?  No obvious signs of internal hernias on CT either...  Recommend continued monitoring off Ozempic and symptomatic management for now.  Endoscopy may or may not benefit on identifying possible gastrogastric fistulas that could be contributing to the remnant distention as well.  Will defer to GI service regarding any endoscopic intervention. labs/images/medications/previous chart entries reviewed personally and relevant changes/updates noted above.

## 2024-01-03 NOTE — Assessment & Plan Note (Deleted)
-   This is manifested by her azotemia. - Will be hydrated with IV normal saline and will follow BMP.

## 2024-01-03 NOTE — Assessment & Plan Note (Addendum)
-   Hold statin 

## 2024-01-03 NOTE — H&P (Signed)
 [place]   PATIENT NAME: [name]    MR#:  [id number]  DATE OF BIRTH:  1956-10-11  DATE OF ADMISSION:  01/03/2024  PRIMARY CARE PHYSICIAN: Lenon Layman ORN, MD   Patient is coming from: Home  REQUESTING/REFERRING PHYSICIAN: Robinette Rasher, MD  CHIEF COMPLAINT:   Chief Complaint  Patient presents with  . Abdominal Pain    HISTORY OF PRESENT ILLNESS:  [name] is a 68 y.o. Caucasian female with medical history significant for osteoarthritis, depression, obesity status post Roux-en-Y gastric bypass, herniorrhaphy, hysterectomy and cholecystectomy and urolithiasis, who presented to the emergency room with acute onset of generalized abdominal pain mainly in the upper abdomen with associated heartburn that started yesterday with abdominal distention.  Since 5 PM she has not been able to eat anything.  She felt the urge to defecate but could not and started having dry heaves with diaphoresis and nausea.  She has been having generalized weakness.   No measured fever or chills.  No chest pain or palpitations.  No cough or wheezing or dyspnea.  No dysuria, oliguria or hematuria or flank pain.  No bleeding diathesis.  She admitted to recently increasing her dose of Ozempic.  She started Ozempic 0.25 mg in November and increased it by mid December to 0.5 mg per his PCP.  ED Course: When the patient came to the ER BP was 125/98 with heart rate of 48 and later 88 with otherwise normal vital signs.  Labs revealed CO2 of 19 and a BUN of 29 with creatinine 1.46 similar to previous creatinine, blood glucose of 211, calcium with 8.6 with otherwise unremarkable CMP.  CBC showed mild leukocytosis of 11.6 with neutrophilia as well as anemia slightly worse than previous levels with hemoglobin 9.9 hematocrit 32.7 and thrombocytosis of 416. EKG as reviewed by me : EKG showed normal sinus rhythm with a rate of 89 with probable left atrial enlargement and poor progression with T wave inversion  anteroseptally. Imaging: Portable chest ray showed no acute cardiopulmonary disease. Abdominal pelvic CT scan with contrast revealed the following:  Status post gastric bypass.  No evidence of bowel obstruction.   Distended excluded stomach with hyperdense debris. Consider endoscopic evaluation of the excluded stomach, as clinically warranted.   Small volume upper abdominal ascites.  The patient was given 20 mg of IV Pepcid , 1 L bolus of IV normal saline and 0.5 mg of IV Dilaudid .  She will be admitted to a medical bed for further evaluation and management. PAST MEDICAL HISTORY:   Past Medical History:  Diagnosis Date  . Arthritis   . Chicken pox   . Depression   . Heartburn   . History of blood transfusion   . History of hemorrhoids   . History of kidney stones   . IDA (iron  deficiency anemia)   . Kidney stones   . Morbid obesity (HCC)    s/p Gastric bypass    PAST SURGICAL HISTORY:   Past Surgical History:  Procedure Laterality Date  . ABDOMINAL HYSTERECTOMY  1997  . CERVICAL SPINE SURGERY  2009   Fusion  . CESAREAN SECTION  1987  . CHOLECYSTECTOMY    . COLONOSCOPY  04/22/2007  . COLONOSCOPY WITH PROPOFOL  N/A 11/30/2017   Procedure: COLONOSCOPY WITH PROPOFOL ;  Surgeon: Dessa Reyes ORN, MD;  Location: [facility] ENDOSCOPY;  Service: Endoscopy;  Laterality: N/A;  . ESOPHAGOGASTRODUODENOSCOPY (EGD) WITH PROPOFOL  N/A 11/30/2017   Procedure: ESOPHAGOGASTRODUODENOSCOPY (EGD) WITH PROPOFOL ;  Surgeon: Dessa Reyes ORN,  MD;  Location: [facility] ENDOSCOPY;  Service: Endoscopy;  Laterality: N/A;  . GASTRIC BYPASS  2007  . HERNIA REPAIR  2011  . KNEE ARTHROSCOPY WITH MEDIAL MENISECTOMY Right 01/15/2022   Procedure: Right knee partial medial and lateral meniscectomies and loose body removal;  Surgeon: Kathlynn Sharper, MD;  Location: [facility] ORS;  Service: Orthopedics;  Laterality: Right;  . OOPHORECTOMY    . TONSILLECTOMY AND ADENOIDECTOMY    . vulvar cysts     Patient reports surgery for  vulvar cysts/fistulas.    SOCIAL HISTORY:   Social History   Tobacco Use  . Smoking status: Never  . Smokeless tobacco: Never  Substance Use Topics  . Alcohol use: Yes    Alcohol/week: 0.0 - 1.0 standard drinks of alcohol    FAMILY HISTORY:   Family History  Problem Relation Age of Onset  . Arthritis Mother   . Arthritis Father   . Lung cancer Father   . Arthritis Maternal Grandmother   . Kidney disease Maternal Grandmother   . Depression Maternal Grandmother   . Breast cancer Sister 57       three times  . Colon cancer Sister 70    DRUG ALLERGIES:   Allergies  Allergen Reactions  . Keflex [Cephalexin] Swelling    Swelling of fingers  . Penicillins Swelling    Swelling in fingers  . Sulfa Antibiotics Swelling    Swelling in fingers    REVIEW OF SYSTEMS:   ROS As per history of present illness. All pertinent systems were reviewed above. Constitutional, HEENT, cardiovascular, respiratory, GI, GU, musculoskeletal, neuro, psychiatric, endocrine, integumentary and hematologic systems were reviewed and are otherwise negative/unremarkable except for positive findings mentioned above in the HPI.   MEDICATIONS AT HOME:   Prior to Admission medications   Medication Sig Start Date End Date Taking? Authorizing Provider  buPROPion  (WELLBUTRIN  XL) 300 MG 24 hr tablet Take 300 mg by mouth daily. 12/24/21   [provider]  gabapentin  (NEURONTIN ) 300 MG capsule Take 300 mg by mouth at bedtime as needed (pain). 12/10/21   [provider]  HYDROcodone -acetaminophen  (NORCO) 5-325 MG tablet Take 1 tablet by mouth every 6 (six) hours as needed for moderate pain. 01/15/22   Kathlynn Sharper, MD  omeprazole  (PRILOSEC  OTC) 20 MG tablet Take 20 mg by mouth daily as needed (acid reflux).    [provider]  topiramate  (TOPAMAX ) 50 MG tablet Take 50 mg by mouth 2 (two) times daily. 12/24/21   [provider]      VITAL SIGNS:  Blood pressure 119/80, pulse  97, temperature 97.6 F (36.4 C), temperature source Oral, resp. rate (!) 21, height 5' 3 (1.6 m), weight 91.6 kg, SpO2 99%.  PHYSICAL EXAMINATION:  Physical Exam  GENERAL:  68 y.o.-year-old Caucasian female patient lying in the bed with no acute distress.  EYES: Pupils equal, round, reactive to light and accommodation. No scleral icterus. Extraocular muscles intact.  HEENT: Head atraumatic, normocephalic. Oropharynx and nasopharynx clear.  NECK:  Supple, no jugular venous distention. No thyroid  enlargement, no tenderness.  LUNGS: Normal breath sounds bilaterally, no wheezing, rales,rhonchi or crepitation. No use of accessory muscles of respiration.  CARDIOVASCULAR: Regular rate and rhythm, S1, S2 normal. No murmurs, rubs, or gallops.  ABDOMEN: Soft, nondistended, with generalized abdominal tenderness without rebound tenderness guarding or rigidity.  Significantly diminished bowel sounds.  No organomegaly or mass.  EXTREMITIES: No pedal edema, cyanosis, or clubbing.  NEUROLOGIC: Cranial nerves II through XII are intact. Muscle strength  5/5 in all extremities. Sensation intact. Gait not checked.  PSYCHIATRIC: The patient is alert and oriented x 3.  Normal affect and good eye contact. SKIN: No obvious rash, lesion, or ulcer.   LABORATORY PANEL:   CBC Recent Labs  Lab 01/03/24 0203  WBC 11.6*  HGB 9.9*  HCT 32.7*  PLT 416*   ------------------------------------------------------------------------------------------------------------------  Chemistries  Recent Labs  Lab 01/03/24 0203  NA 138  K 4.2  CL 105  CO2 19*  GLUCOSE 211*  BUN 29*  CREATININE 1.46*  CALCIUM 8.6*  AST 20  ALT 10  ALKPHOS 70  BILITOT 0.7   ------------------------------------------------------------------------------------------------------------------  Cardiac Enzymes No results for input(s): TROPONINI in the last 168  hours. ------------------------------------------------------------------------------------------------------------------  RADIOLOGY:  DG Abd 2 Views Result Date: 01/03/2024 CLINICAL DATA:  68 year old female with abdominal pain, enteric tube placement. EXAM: ABDOMEN - 2 VIEW COMPARISON:  CT Abdomen and Pelvis 0333 hours today. FINDINGS: Portable AP upright view of the chest at 0557 hours. Supine views of the abdomen at 0602 hours. Enteric tube has been placed into the stomach, side hole at the level of the gastric fundus. Underlying gastric bypass changes better demonstrated by CT. Stable bowel gas pattern since that time. Stable cholecystectomy clips. Excreted IV contrast in the urinary bladder. Lungs and mediastinal contours appear stable, no confluent pulmonary opacity. Stable visualized osseous structures. IMPRESSION: 1. Satisfactory enteric tube placement into the stomach. 2. Underlying gastric bypass changes as demonstrated by CT. Stable bowel gas pattern. 3. No acute cardiopulmonary abnormality. Electronically Signed   By: VEAR Hurst M.D.   On: 01/03/2024 06:19   CT ABDOMEN PELVIS W CONTRAST Result Date: 01/03/2024 CLINICAL DATA:  Upper abdominal pain, nausea EXAM: CT ABDOMEN AND PELVIS WITH CONTRAST TECHNIQUE: Multidetector CT imaging of the abdomen and pelvis was performed using the standard protocol following bolus administration of intravenous contrast. RADIATION DOSE REDUCTION: This exam was performed according to the departmental dose-optimization program which includes automated exposure control, adjustment of the mA and/or kV according to patient size and/or use of iterative reconstruction technique. CONTRAST:  80mL OMNIPAQUE  IOHEXOL  300 MG/ML  SOLN COMPARISON:  12/13/2014 FINDINGS: Lower chest: Mild compressive atelectasis in the medial bilateral lower lobes. Hepatobiliary: Liver is within normal limits. Status post cholecystectomy. No intrahepatic or extrahepatic dilatation. Pancreas: Within  normal limits. Spleen: Within normal limits. Adrenals/Urinary Tract: Adrenal glands are within normal limits. Right kidney is within normal limits. Two left upper pole renal cysts measuring up to 5.0 cm (series 2/image 30), measuring simple fluid density, benign (Bosniak I). No follow-up is recommended. No hydronephrosis. Bladder is within normal limits. Stomach/Bowel: Status post gastric bypass. Distended excluded stomach with hyperdense debris. No evidence of bowel obstruction. Normal appendix (series 2/image 55). Left colonic diverticulosis, without evidence of diverticulitis. Vascular/Lymphatic: No evidence of abdominal aortic aneurysm. No suspicious abdominopelvic lymphadenopathy. Reproductive: Status post hysterectomy. No adnexal masses. Other: Small volume upper abdominal ascites. Musculoskeletal: Degenerative changes of the visualized thoracolumbar spine. Grade 2 spondylolisthesis at L5-S1. IMPRESSION: Status post gastric bypass.  No evidence of bowel obstruction. Distended excluded stomach with hyperdense debris. Consider endoscopic evaluation of the excluded stomach, as clinically warranted. Small volume upper abdominal ascites. Additional ancillary findings as above. Electronically Signed   By: Pinkie Pebbles M.D.   On: 01/03/2024 03:51   DG Chest Port 1 View Result Date: 01/03/2024 CLINICAL DATA:  Abdominal pain. EXAM: PORTABLE CHEST 1 VIEW COMPARISON:  None Available. FINDINGS: The heart size and mediastinal contours are within normal limits. Both lungs  are clear. A radiopaque fusion plate and screws are seen overlying the cervical spine. Multilevel degenerative changes are noted throughout the thoracic spine. IMPRESSION: No active cardiopulmonary disease. Electronically Signed   By: Suzen Dials M.D.   On: 01/03/2024 02:39      IMPRESSION AND PLAN:  Assessment and Plan: * Ileus (HCC) - The patient will be admitted to a medical-surgical bed. - Will keep.  Her n.p.o. except for  medications. - Pain management will be provided. - She will be hydrated with IV normal saline. - Will optimize her electrolytes. - We will repeat two-view abdomen x-ray. - NG tube will be placed. - General Surgery consult will be obtained. - I notified Dr. Tye about the patient. - Given distended stomach and possible need for EGD, GI consult will be obtained. - I notified Dr. Therisa about the patient and discussed that with Dr. Jinny who will see the patient.  Dehydration - This is manifested by her azotemia. - Will be hydrated with IV normal saline and will follow BMP.  Dyslipidemia - We will continue statin therapy.  Depression - Her depression diagnosis is remote.  She has been on Wellbutrin  XL mainly for weight loss but has stopped it.  Stage 3b chronic kidney disease (CKD) (HCC) - She has a fairly stable creatinine  GERD without esophagitis - We will continue PPI therapy.   DVT prophylaxis: Lovenox .  Advanced Care Planning:  Code Status: full code.  Family Communication:  The plan of care was discussed in details with the patient (and family). I answered all questions. The patient agreed to proceed with the above mentioned plan. Further management will depend upon hospital course. Disposition Plan: Back to previous home environment Consults called: General Surgery All the records are reviewed and case discussed with ED provider.  Status is: Inpatient  At the time of the admission, it appears that the appropriate admission status for this patient is inpatient.  This is judged to be reasonable and necessary in order to provide the required intensity of service to ensure the patient's safety given the presenting symptoms, physical exam findings and initial radiographic and laboratory data in the context of comorbid conditions.  The patient requires inpatient status due to high intensity of service, high risk of further deterioration and high frequency of surveillance  required.  I certify that at the time of admission, it is my clinical judgment that the patient will require inpatient hospital care extending more than 2 midnights.                            Dispo: The patient is from: Home              Anticipated d/c is to: Home              Patient currently is not medically stable to d/c.              Difficult to place patient: No  [place] DELENA Peaches M.D on 01/03/2024 at 7:00 AM  [facility]   From 7 PM-7 AM, contact night-coverage [email or web address]  CC: Primary care physician; Lenon Layman ORN, MD

## 2024-01-03 NOTE — Assessment & Plan Note (Deleted)
-   She has a fairly stable creatinine

## 2024-01-03 NOTE — Assessment & Plan Note (Deleted)
-   Her depression diagnosis is remote.  She has been on Wellbutrin XL mainly for weight loss but has stopped it.

## 2024-01-04 ENCOUNTER — Encounter: Payer: Self-pay | Admitting: Family Medicine

## 2024-01-04 ENCOUNTER — Encounter (HOSPITAL_COMMUNITY): Payer: Self-pay

## 2024-01-04 ENCOUNTER — Inpatient Hospital Stay: Payer: Medicare Other

## 2024-01-04 DIAGNOSIS — K567 Ileus, unspecified: Secondary | ICD-10-CM | POA: Diagnosis not present

## 2024-01-04 LAB — CBC WITH DIFFERENTIAL/PLATELET
Abs Immature Granulocytes: 0.05 10*3/uL (ref 0.00–0.07)
Basophils Absolute: 0 10*3/uL (ref 0.0–0.1)
Basophils Relative: 0 %
Eosinophils Absolute: 0 10*3/uL (ref 0.0–0.5)
Eosinophils Relative: 0 %
HCT: 23.4 % — ABNORMAL LOW (ref 36.0–46.0)
Hemoglobin: 7.4 g/dL — ABNORMAL LOW (ref 12.0–15.0)
Immature Granulocytes: 1 %
Lymphocytes Relative: 8 %
Lymphs Abs: 0.7 10*3/uL (ref 0.7–4.0)
MCH: 28.5 pg (ref 26.0–34.0)
MCHC: 31.6 g/dL (ref 30.0–36.0)
MCV: 90 fL (ref 80.0–100.0)
Monocytes Absolute: 0.9 10*3/uL (ref 0.1–1.0)
Monocytes Relative: 10 %
Neutro Abs: 7.4 10*3/uL (ref 1.7–7.7)
Neutrophils Relative %: 81 %
Platelets: 271 10*3/uL (ref 150–400)
RBC: 2.6 MIL/uL — ABNORMAL LOW (ref 3.87–5.11)
RDW: 14.3 % (ref 11.5–15.5)
WBC: 9.1 10*3/uL (ref 4.0–10.5)
nRBC: 0 % (ref 0.0–0.2)

## 2024-01-04 LAB — COMPREHENSIVE METABOLIC PANEL
ALT: 7 U/L (ref 0–44)
AST: 11 U/L — ABNORMAL LOW (ref 15–41)
Albumin: 2.6 g/dL — ABNORMAL LOW (ref 3.5–5.0)
Alkaline Phosphatase: 52 U/L (ref 38–126)
Anion gap: 7 (ref 5–15)
BUN: 27 mg/dL — ABNORMAL HIGH (ref 8–23)
CO2: 22 mmol/L (ref 22–32)
Calcium: 7.9 mg/dL — ABNORMAL LOW (ref 8.9–10.3)
Chloride: 111 mmol/L (ref 98–111)
Creatinine, Ser: 1.04 mg/dL — ABNORMAL HIGH (ref 0.44–1.00)
GFR, Estimated: 59 mL/min — ABNORMAL LOW (ref >60)
Glucose, Bld: 105 mg/dL — ABNORMAL HIGH (ref 70–99)
Potassium: 4.1 mmol/L (ref 3.5–5.1)
Sodium: 140 mmol/L (ref 135–145)
Total Bilirubin: 0.5 mg/dL (ref 0.0–1.2)
Total Protein: 5.3 g/dL — ABNORMAL LOW (ref 6.5–8.1)

## 2024-01-04 LAB — GLUCOSE, CAPILLARY
Glucose-Capillary: 95 mg/dL (ref 70–99)
Glucose-Capillary: 94 mg/dL (ref 70–99)

## 2024-01-04 LAB — HIV ANTIBODY (ROUTINE TESTING W REFLEX): HIV Screen 4th Generation wRfx: NONREACTIVE

## 2024-01-04 LAB — HEMOGLOBIN AND HEMATOCRIT, BLOOD
HCT: 25.4 % — ABNORMAL LOW (ref 36.0–46.0)
Hemoglobin: 8.1 g/dL — ABNORMAL LOW (ref 12.0–15.0)

## 2024-01-04 LAB — PHOSPHORUS: Phosphorus: 3.5 mg/dL (ref 2.5–4.6)

## 2024-01-04 LAB — MAGNESIUM: Magnesium: 1.9 mg/dL (ref 1.7–2.4)

## 2024-01-04 MED ORDER — IOHEXOL 350 MG/ML SOLN
100.0000 mL | Freq: Once | INTRAVENOUS | Status: AC | PRN
  Administered 2024-01-04: 100 mL via INTRAVENOUS

## 2024-01-04 NOTE — Plan of Care (Signed)
  Problem: Education: Goal: Knowledge of General Education information will improve Description: Including pain rating scale, medication(s)/side effects and non-pharmacologic comfort measures 01/04/2024 0141 by [place], RN Outcome: Progressing 01/04/2024 0139 by [place], RN Outcome: Progressing   Problem: Clinical Measurements: Goal: Will remain free from infection Outcome: Progressing   Problem: Nutrition: Goal: Adequate nutrition will be maintained Outcome: Progressing   Problem: Coping: Goal: Level of anxiety will decrease Outcome: Progressing   Problem: Pain Management: Goal: General experience of comfort will improve Outcome: Progressing   Problem: Safety: Goal: Ability to remain free from injury will improve Outcome: Progressing   Problem: Skin Integrity: Goal: Risk for impaired skin integrity will decrease Outcome: Progressing

## 2024-01-04 NOTE — Plan of Care (Signed)
   Problem: Education: Goal: Knowledge of General Education information will improve Description: Including pain rating scale, medication(s)/side effects and non-pharmacologic comfort measures Outcome: Progressing   Problem: Clinical Measurements: Goal: Ability to maintain clinical measurements within normal limits will improve Outcome: Progressing Goal: Will remain free from infection Outcome: Progressing

## 2024-01-04 NOTE — Progress Notes (Signed)
 Helen Copping, MD [facility]   [address] [place], [place] [address] Phone: [phone] Fax : [phone]   Subjective: The patient continues to be uncomfortable due to the NG tube in her nose and continued abdominal pain.  The patient had dropped her hemoglobin significantly overnight without any sign of any GI bleeding.  The patient was seen by surgery who recommended the patient be transferred to a tertiary care center due to the debris found in the stomach which may be blood in light of the significant hemoglobin drop.   Objective: Vital signs in last 24 hours: Vitals:   01/03/24 1500 01/03/24 1900 01/04/24 0404 01/04/24 0738  BP: 125/84 130/76 122/80 124/88  Pulse: 95 98 (!) 106 100  Resp: 14 15 18 18   Temp:   99.7 F (37.6 C) 99.5 F (37.5 C)  TempSrc:   Oral Oral  SpO2: 97%  92% 94%  Weight:      Height:       Weight change:   Intake/Output Summary (Last 24 hours) at 01/04/2024 1524 Last data filed at 01/04/2024 0900 Gross per 24 hour  Intake 2099.77 ml  Output 0 ml  Net 2099.77 ml     Exam: General: Patient laying in bed in no apparent distress although uncomfortable with the NG tube in place.  She is tolerating sips of water   Lab Results: @LABTEST2 @ Micro Results: No results found for this or any previous visit (from the past 240 hours). Studies/Results: CT ANGIO GI BLEED Result Date: 01/04/2024 CLINICAL DATA:  Concern for upper GI bleed. EXAM: CTA ABDOMEN AND PELVIS WITHOUT AND WITH CONTRAST TECHNIQUE: Multidetector CT imaging of the abdomen and pelvis was performed using the standard protocol during bolus administration of intravenous contrast. Multiplanar reconstructed images and MIPs were obtained and reviewed to evaluate the vascular anatomy. RADIATION DOSE REDUCTION: This exam was performed according to the departmental dose-optimization program which includes automated exposure control, adjustment of the mA and/or kV according to patient size  and/or use of iterative reconstruction technique. CONTRAST:  OMNIPAQUE  IOHEXOL  350 MG/ML SOLN COMPARISON:  CT abdomen pelvis dated 01/03/2024. FINDINGS: VASCULAR Aorta: Normal caliber aorta without aneurysm, dissection, vasculitis or significant stenosis. Celiac: Patent without evidence of aneurysm, dissection, vasculitis or significant stenosis. SMA: Patent without evidence of aneurysm, dissection, vasculitis or significant stenosis. Renals: Both renal arteries are patent without evidence of aneurysm, dissection, vasculitis, fibromuscular dysplasia or significant stenosis. IMA: Patent without evidence of aneurysm, dissection, vasculitis or significant stenosis. Inflow: Patent without evidence of aneurysm, dissection, vasculitis or significant stenosis. Proximal Outflow: The visualized proximal outflow is patent. Veins: The IVC is unremarkable. The SMV, splenic vein, and main portal vein are patent. No portal venous gas. Review of the MIP images confirms the above findings. NON-VASCULAR Lower chest: There are bibasilar atelectasis. There is coronary vascular calcification. No intra-abdominal free air. Small ascites. There is diffuse infiltration of the upper mesentery and surrounding the stomach. Hepatobiliary: The liver is unremarkable.  Cholecystectomy. Pancreas: Unremarkable. No pancreatic ductal dilatation or surrounding inflammatory changes. Spleen: Normal in size without focal abnormality. Adrenals/Urinary Tract: The adrenal glands are unremarkable. Vascular calcification versus possible punctate nonobstructing left renal upper pole calculus. There is no hydronephrosis or obstructing stone on either side. Left renal cysts measure up to 4.5 cm in the upper pole. The visualized ureters appear unremarkable. The urinary bladder is grossly unremarkable. 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 An enteric tube has been placed with tip in the  proximal jejunum of the Roux limb.  There is distended excluded portion of the stomach with heterogeneous and high attenuating content. No evidence of active GI bleed. There is sigmoid diverticulosis. No bowel obstruction. The appendix is unremarkable as visualized. Lymphatic: No adenopathy. Reproductive: Hysterectomy.  No suspicious adnexal mass. Other: Midline vertical anterior pelvic wall incisional scar. A ventral hernia repair mesh noted. Musculoskeletal: Degenerative changes of the spine. No acute osseous pathology. Bilateral L5 pars defects with grade 1 anterolisthesis. IMPRESSION: 1. No evidence of active GI bleed. 2. Postsurgical changes of Roux-en-Y gastric bypass. Distended excluded portion of the stomach with heterogeneous and high attenuating content similar to prior CT. 3. Sigmoid diverticulosis. No bowel obstruction. Electronically Signed   By: Vanetta Chou M.D.   On: 01/04/2024 12:28   DG Abd 2 Views Result Date: 01/03/2024 CLINICAL DATA:  68 year old female with abdominal pain, enteric tube placement. EXAM: ABDOMEN - 2 VIEW COMPARISON:  CT Abdomen and Pelvis 0333 hours today. FINDINGS: Portable AP upright view of the chest at 0557 hours. Supine views of the abdomen at 0602 hours. Enteric tube has been placed into the stomach, side hole at the level of the gastric fundus. Underlying gastric bypass changes better demonstrated by CT. Stable bowel gas pattern since that time. Stable cholecystectomy clips. Excreted IV contrast in the urinary bladder. Lungs and mediastinal contours appear stable, no confluent pulmonary opacity. Stable visualized osseous structures. IMPRESSION: 1. Satisfactory enteric tube placement into the stomach. 2. Underlying gastric bypass changes as demonstrated by CT. Stable bowel gas pattern. 3. No acute cardiopulmonary abnormality. Electronically Signed   By: VEAR Hurst M.D.   On: 01/03/2024 06:19   CT ABDOMEN PELVIS W CONTRAST Result Date: 01/03/2024 CLINICAL DATA:  Upper abdominal pain, nausea EXAM: CT  ABDOMEN AND PELVIS WITH CONTRAST TECHNIQUE: Multidetector CT imaging of the abdomen and pelvis was performed using the standard protocol following bolus administration of intravenous contrast. RADIATION DOSE REDUCTION: This exam was performed according to the departmental dose-optimization program which includes automated exposure control, adjustment of the mA and/or kV according to patient size and/or use of iterative reconstruction technique. CONTRAST:  80mL OMNIPAQUE  IOHEXOL  300 MG/ML  SOLN COMPARISON:  12/13/2014 FINDINGS: Lower chest: Mild compressive atelectasis in the medial bilateral lower lobes. Hepatobiliary: Liver is within normal limits. Status post cholecystectomy. No intrahepatic or extrahepatic dilatation. Pancreas: Within normal limits. Spleen: Within normal limits. Adrenals/Urinary Tract: Adrenal glands are within normal limits. Right kidney is within normal limits. Two left upper pole renal cysts measuring up to 5.0 cm (series 2/image 30), measuring simple fluid density, benign (Bosniak I). No follow-up is recommended. No hydronephrosis. Bladder is within normal limits. Stomach/Bowel: Status post gastric bypass. Distended excluded stomach with hyperdense debris. No evidence of bowel obstruction. Normal appendix (series 2/image 55). Left colonic diverticulosis, without evidence of diverticulitis. Vascular/Lymphatic: No evidence of abdominal aortic aneurysm. No suspicious abdominopelvic lymphadenopathy. Reproductive: Status post hysterectomy. No adnexal masses. Other: Small volume upper abdominal ascites. Musculoskeletal: Degenerative changes of the visualized thoracolumbar spine. Grade 2 spondylolisthesis at L5-S1. IMPRESSION: Status post gastric bypass.  No evidence of bowel obstruction. Distended excluded stomach with hyperdense debris. Consider endoscopic evaluation of the excluded stomach, as clinically warranted. Small volume upper abdominal ascites. Additional ancillary findings as above.  Electronically Signed   By: Pinkie Pebbles M.D.   On: 01/03/2024 03:51   DG Chest Port 1 View Result Date: 01/03/2024 CLINICAL DATA:  Abdominal pain. EXAM: PORTABLE CHEST 1 VIEW COMPARISON:  None Available. FINDINGS: The heart size  and mediastinal contours are within normal limits. Both lungs are clear. A radiopaque fusion plate and screws are seen overlying the cervical spine. Multilevel degenerative changes are noted throughout the thoracic spine. IMPRESSION: No active cardiopulmonary disease. Electronically Signed   By: Suzen Dials M.D.   On: 01/03/2024 02:39   Medications: I have reviewed the patient's current medications. 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  The NG tube has not produced any sign of any GI bleeding from her upper GI tract at the present time.  There is also been no sign of any lower GI bleeding.  The patient went from 7.4 to 8.1 after a transfusion.  There is nothing further to do from a GI point of view since the remnant stomach is not accessible with the scopes at this hospital.  I agree with transferring to a tertiary care center.  The CT angiography was negative for any acute GI bleed.  The patient and her family have been explained the plan and agree with it.   LOS: 1 day   Helen Jinny HAMMANS 01/04/2024, 3:24 PM Pager [phone] 7am-5pm   Check AMION for 5pm -7am coverage and on weekends

## 2024-01-04 NOTE — Progress Notes (Signed)
 PROGRESS NOTE    [name]  FMW:[id number] DOB: 08-11-1956 DOA: 01/03/2024 PCP: Lenon Layman ORN, MD  Chief Complaint  Patient presents with   Abdominal Pain    Hospital Course:  [name] is 68 y.o. female with osteoarthritis, depression, obesity status post Roux-en-Y gastric bypass, prior hysterectomy, prior cholecystectomy, prior hernia repair, who presents to the ED complaining of abdominal pain.  Patient also reported inability to eat due to nausea/vomiting, generalized weakness, vomiting.  She did report recently increasing her dose of Ozempic from 0.25-0.5. In the ED she was found to be hemodynamically stable, creatinine 1.46 which appears close to baseline.  WBC revealed mild leukocytosis of 11.6 with neutrophilia and anemia with hemoglobin of 9.9, thrombocytosis of 416.  CT scan revealed history of gastric bypass, no evidence of bowel obstruction.  It did also reveal distended excluded stomach with hyperdense debris.  Subjective: No acute events overnight.  Continues to experience abdominal pain and bloating today.  She is requesting ice chips. Hemoglobin has dropped to 7.5 overnight.  No overt signs of GI bleed.   Objective: Vitals:   01/03/24 1500 01/03/24 1900 01/04/24 0404 01/04/24 0738  BP: 125/84 130/76 122/80 124/88  Pulse: 95 98 (!) 106 100  Resp: 14 15 18 18   Temp:   99.7 F (37.6 C) 99.5 F (37.5 C)  TempSrc:   Oral Oral  SpO2: 97%  92% 94%  Weight:      Height:        Intake/Output Summary (Last 24 hours) at 01/04/2024 0746 Last data filed at 01/04/2024 0300 Gross per 24 hour  Intake 3099.77 ml  Output --  Net 3099.77 ml   Filed Weights   01/03/24 0210  Weight: 91.6 kg    Examination: General exam: Appears calm and comfortable, NAD  Respiratory system: No work of breathing, symmetric chest wall expansion Cardiovascular system: S1 & S2 heard, RRR.  Gastrointestinal system: Abdomen diffusely tender to palpation, nondistended, no bruising  or ecchymosis, midline surgical scar Neuro: Alert and oriented. No focal neurological deficits. Extremities: Symmetric, expected ROM Skin: No rashes, lesions Psychiatry: Demonstrates appropriate judgement and insight. Mood & affect appropriate for situation.   Assessment & Plan:  Principal Problem:   Ileus (HCC) Active Problems:   Dehydration   Dyslipidemia   Depression   GERD without esophagitis   Stage 3b chronic kidney disease (CKD) (HCC)    Ileus Distended excluded stomach - In setting of multiple prior abdominal surgeries including hysterectomy, cholecystectomy, Roux-en-Y gastric bypass, hernia repair - Complicated by distended excluded stomach --Unclear cause of extended excluded stomach.  Could be secondary to gastroparesis prompted by Ozempic.  There is also concern for gastrogastric fistula as given the debris seen in the excluded stomach, though this could also be blood. -- Gastroenterology was consulted for possible endoscopic intervention however excluded stomach is likely out of the reach of endoscope (>100cm) If endoscopic evaluation becomes necessary patient will likely need to transfer to another facility for for double-balloon enteroscopy.  Has been accepted clinically at [facility] but is still pending administrative bed at this time.  [facility] acceptance per general surgeon Dr. Talitha (Discussed directly with Dr. Tye) - For now we will proceed with observation and monitoring of ileus - NG tube not currently hooked up to suction, general surgery has requested to remain in place for now - Ice chips - Continue IV hydration - Optimize electrolytes  Acute anemia - Baseline hemoglobin appears close to 10, down to 7.4 this a.m.  has dropped 2.5d/dL since admission - Some of this may be hemodilutional given she has been on IV fluids. -- Repeat H&H increasing, now 8.1. -- No overt signs of bleeding -- CTA bleed scan without obvious source of bleeding. Otherwise plan is as above to  transfer to [facility]  Dehydration -  anorexia prior to arrival - Continue with IV fluids - Trend CMP  Dyslipidemia - Continue statin  Depression - Resume home meds  CKD stage IIIb - Appears to be at baseline - Will continue to trend CMP - Avoid nephrotoxic meds - Renally dose when needed  GERD without esophagitis - Continue PPI therapy  History of cholecystectomy History of Roux-en-Y bypass History of hernia repair History of hysterectomy - High risk for SBO, no current sign of obstruction - Monitor electrolytes closely, high risk for malnutrition given history of Roux-en-Y bypass  OSA - no longer wear CPAP at night  DVT prophylaxis: lovenox    Code Status: Full Code Family Communication: Directly with patient Disposition:  Status is: Inpatient, pending transfer    Consultants:  General Surgery: Dr Tye   Treatment Team:  Consulting Physician: Jinny Carmine, MD Consulting Physician: Tye Millet, DO  Procedures:    Antimicrobials:  Anti-infectives (From admission, onward)    None       Data Reviewed: I have personally reviewed following labs and imaging studies CBC: Recent Labs  Lab 01/03/24 0203 01/04/24 0604  WBC 11.6* 9.1  NEUTROABS 10.0* 7.4  HGB 9.9* 7.4*  HCT 32.7* 23.4*  MCV 94.8 90.0  PLT 416* 271   Basic Metabolic Panel: Recent Labs  Lab 01/03/24 0203 01/04/24 0604  NA 138 140  K 4.2 4.1  CL 105 111  CO2 19* 22  GLUCOSE 211* 105*  BUN 29* 27*  CREATININE 1.46* 1.04*  CALCIUM 8.6* 7.9*  MG  --  1.9  PHOS  --  3.5   GFR: Estimated Creatinine Clearance: 56.4 mL/min (A) (by C-G formula based on SCr of 1.04 mg/dL (H)). Liver Function Tests: Recent Labs  Lab 01/03/24 0203 01/04/24 0604  AST 20 11*  ALT 10 7  ALKPHOS 70 52  BILITOT 0.7 0.5  PROT 6.8 5.3*  ALBUMIN 3.7 2.6*   CBG: No results for input(s): GLUCAP in the last 168 hours.  No results found for this or any previous visit (from the past 240 hours).    Radiology Studies: DG Abd 2 Views Result Date: 01/03/2024 CLINICAL DATA:  68 year old female with abdominal pain, enteric tube placement. EXAM: ABDOMEN - 2 VIEW COMPARISON:  CT Abdomen and Pelvis 0333 hours today. FINDINGS: Portable AP upright view of the chest at 0557 hours. Supine views of the abdomen at 0602 hours. Enteric tube has been placed into the stomach, side hole at the level of the gastric fundus. Underlying gastric bypass changes better demonstrated by CT. Stable bowel gas pattern since that time. Stable cholecystectomy clips. Excreted IV contrast in the urinary bladder. Lungs and mediastinal contours appear stable, no confluent pulmonary opacity. Stable visualized osseous structures. IMPRESSION: 1. Satisfactory enteric tube placement into the stomach. 2. Underlying gastric bypass changes as demonstrated by CT. Stable bowel gas pattern. 3. No acute cardiopulmonary abnormality. Electronically Signed   By: VEAR Hurst M.D.   On: 01/03/2024 06:19   CT ABDOMEN PELVIS W CONTRAST Result Date: 01/03/2024 CLINICAL DATA:  Upper abdominal pain, nausea EXAM: CT ABDOMEN AND PELVIS WITH CONTRAST TECHNIQUE: Multidetector CT imaging of the abdomen and pelvis was performed using the standard protocol following  bolus administration of intravenous contrast. RADIATION DOSE REDUCTION: This exam was performed according to the departmental dose-optimization program which includes automated exposure control, adjustment of the mA and/or kV according to patient size and/or use of iterative reconstruction technique. CONTRAST:  80mL OMNIPAQUE  IOHEXOL  300 MG/ML  SOLN COMPARISON:  12/13/2014 FINDINGS: Lower chest: Mild compressive atelectasis in the medial bilateral lower lobes. Hepatobiliary: Liver is within normal limits. Status post cholecystectomy. No intrahepatic or extrahepatic dilatation. Pancreas: Within normal limits. Spleen: Within normal limits. Adrenals/Urinary Tract: Adrenal glands are within normal limits. Right  kidney is within normal limits. Two left upper pole renal cysts measuring up to 5.0 cm (series 2/image 30), measuring simple fluid density, benign (Bosniak I). No follow-up is recommended. No hydronephrosis. Bladder is within normal limits. Stomach/Bowel: Status post gastric bypass. Distended excluded stomach with hyperdense debris. No evidence of bowel obstruction. Normal appendix (series 2/image 55). Left colonic diverticulosis, without evidence of diverticulitis. Vascular/Lymphatic: No evidence of abdominal aortic aneurysm. No suspicious abdominopelvic lymphadenopathy. Reproductive: Status post hysterectomy. No adnexal masses. Other: Small volume upper abdominal ascites. Musculoskeletal: Degenerative changes of the visualized thoracolumbar spine. Grade 2 spondylolisthesis at L5-S1. IMPRESSION: Status post gastric bypass.  No evidence of bowel obstruction. Distended excluded stomach with hyperdense debris. Consider endoscopic evaluation of the excluded stomach, as clinically warranted. Small volume upper abdominal ascites. Additional ancillary findings as above. Electronically Signed   By: Pinkie Pebbles M.D.   On: 01/03/2024 03:51   DG Chest Port 1 View Result Date: 01/03/2024 CLINICAL DATA:  Abdominal pain. EXAM: PORTABLE CHEST 1 VIEW COMPARISON:  None Available. FINDINGS: The heart size and mediastinal contours are within normal limits. Both lungs are clear. A radiopaque fusion plate and screws are seen overlying the cervical spine. Multilevel degenerative changes are noted throughout the thoracic spine. IMPRESSION: No active cardiopulmonary disease. Electronically Signed   By: Suzen Dials M.D.   On: 01/03/2024 02:39    Scheduled Meds:  enoxaparin  (LOVENOX ) injection  45 mg Subcutaneous Q24H   pantoprazole  (PROTONIX ) IV  40 mg Intravenous Q12H   [START ON 01/05/2024] Vitamin D  (Ergocalciferol )  50,000 Units Oral Weekly   Continuous Infusions:     LOS: 1 day    Time spent:   55min  Tymara Saur, DO [facility]  To contact the attending physician between 7A-7P please use Epic Chat. To contact the covering physician during after hours 7P-7A, please review Amion.   01/04/2024, 7:46 AM   *This document has been created with the assistance of dictation software. Please excuse typographical errors. *

## 2024-01-04 NOTE — Progress Notes (Signed)
 PT WAS ORDERED A CPAP FOR QHS, PT DOES NOT USE A CPAP MACHINE AT HOME SINCE HER WEIGHT LOSS SURGERY AND REFUSED A MACHINE HERE FOR THIS STAY.

## 2024-01-04 NOTE — Progress Notes (Signed)
 Subjective:  CC: [name] is a 68 y.o. female  Hospital stay day 1,   ileus versus GI bleed  HPI: No acute events reported overnight.  Patient continues to have pain in the epigastric region.  Hemoglobin did drop overnight.  Denies any melena or hematochezia.  ROS:  General: Denies weight loss, weight gain, fatigue, fevers, chills, and night sweats. Heart: Denies chest pain, palpitations, racing heart, irregular heartbeat, leg pain or swelling, and decreased activity tolerance. Respiratory: Denies breathing difficulty, shortness of breath, wheezing, cough, and sputum. GI: Denies change in appetite, heartburn, nausea, vomiting, constipation, diarrhea, and blood in stool. GU: Denies difficulty urinating, pain with urinating, urgency, frequency, blood in urine.   Objective:   Temp:  [99.5 F (37.5 C)-99.7 F (37.6 C)] 99.5 F (37.5 C) (01/14 0738) Pulse Rate:  [95-106] 100 (01/14 0738) Resp:  [14-18] 18 (01/14 0738) BP: (122-130)/(76-88) 124/88 (01/14 0738) SpO2:  [92 %-97 %] 94 % (01/14 0738)     Height: 5' 3 (160 cm) Weight: 91.6 kg BMI (Calculated): 35.79   Intake/Output this shift:   Intake/Output Summary (Last 24 hours) at 01/04/2024 1406 Last data filed at 01/04/2024 0900 Gross per 24 hour  Intake 2099.77 ml  Output 0 ml  Net 2099.77 ml    Constitutional :  alert, cooperative, appears stated age, and no distress  Respiratory:  clear to auscultation bilaterally  Cardiovascular:  regular rate and rhythm  Gastrointestinal: Soft, no guarding, focal tenderness to palpation epigastric region .   Skin: Cool and moist.   Psychiatric: Normal affect, non-agitated, not confused       LABS:     Latest Ref Rng & Units 01/04/2024    6:04 AM 01/03/2024    2:03 AM 01/07/2022    1:09 PM  CMP  Glucose 70 - 99 mg/dL 894  788  89   BUN 8 - 23 mg/dL 27  29  12    Creatinine 0.44 - 1.00 mg/dL 8.95  8.53  8.53   Sodium 135 - 145 mmol/L 140  138  139   Potassium 3.5 - 5.1 mmol/L  4.1  4.2  4.0   Chloride 98 - 111 mmol/L 111  105  112   CO2 22 - 32 mmol/L 22  19  22    Calcium 8.9 - 10.3 mg/dL 7.9  8.6  8.9   Total Protein 6.5 - 8.1 g/dL 5.3  6.8    Total Bilirubin 0.0 - 1.2 mg/dL 0.5  0.7    Alkaline Phos 38 - 126 U/L 52  70    AST 15 - 41 U/L 11  20    ALT 0 - 44 U/L 7  10        Latest Ref Rng & Units 01/04/2024    6:04 AM 01/03/2024    2:03 AM 01/07/2022    1:09 PM  CBC  WBC 4.0 - 10.5 K/uL 9.1  11.6  4.9   Hemoglobin 12.0 - 15.0 g/dL 7.4  9.9  89.4   Hematocrit 36.0 - 46.0 % 23.4  32.7  33.8   Platelets 150 - 400 K/uL 271  416  309     RADS: CLINICAL DATA:  Concern for upper GI bleed.   EXAM: CTA ABDOMEN AND PELVIS WITHOUT AND WITH CONTRAST   TECHNIQUE: Multidetector CT imaging of the abdomen and pelvis was performed using the standard protocol during bolus administration of intravenous contrast. Multiplanar reconstructed images and MIPs were obtained and reviewed to evaluate the vascular  anatomy.   RADIATION DOSE REDUCTION: This exam was performed according to the departmental dose-optimization program which includes automated exposure control, adjustment of the mA and/or kV according to patient size and/or use of iterative reconstruction technique.   CONTRAST:  OMNIPAQUE  IOHEXOL  350 MG/ML SOLN   COMPARISON:  CT abdomen pelvis dated 01/03/2024.   FINDINGS: VASCULAR   Aorta: Normal caliber aorta without aneurysm, dissection, vasculitis or significant stenosis.   Celiac: Patent without evidence of aneurysm, dissection, vasculitis or significant stenosis.   SMA: Patent without evidence of aneurysm, dissection, vasculitis or significant stenosis.   Renals: Both renal arteries are patent without evidence of aneurysm, dissection, vasculitis, fibromuscular dysplasia or significant stenosis.   IMA: Patent without evidence of aneurysm, dissection, vasculitis or significant stenosis.   Inflow: Patent without evidence of aneurysm,  dissection, vasculitis or significant stenosis.   Proximal Outflow: The visualized proximal outflow is patent.   Veins: The IVC is unremarkable. The SMV, splenic vein, and main portal vein are patent. No portal venous gas.   Review of the MIP images confirms the above findings.   NON-VASCULAR   Lower chest: There are bibasilar atelectasis. There is coronary vascular calcification.   No intra-abdominal free air. Small ascites. There is diffuse infiltration of the upper mesentery and surrounding the stomach.   Hepatobiliary: The liver is unremarkable.  Cholecystectomy.   Pancreas: Unremarkable. No pancreatic ductal dilatation or surrounding inflammatory changes.   Spleen: Normal in size without focal abnormality.   Adrenals/Urinary Tract: The adrenal glands are unremarkable. Vascular calcification versus possible punctate nonobstructing left renal upper pole calculus. There is no hydronephrosis or obstructing stone on either side. Left renal cysts measure up to 4.5 cm in the upper pole. The visualized ureters appear unremarkable. The urinary bladder is grossly unremarkable.   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 An enteric tube has been placed with tip in the proximal jejunum of the Roux limb. There is distended excluded portion of the stomach with heterogeneous and high attenuating content. No evidence of active GI bleed. There is sigmoid diverticulosis. No bowel obstruction. The appendix is unremarkable as visualized.   Lymphatic: No adenopathy.   Reproductive: Hysterectomy.  No suspicious adnexal mass.   Other: Midline vertical anterior pelvic wall incisional scar. A ventral hernia repair mesh noted.   Musculoskeletal: Degenerative changes of the spine. No acute osseous pathology. Bilateral L5 pars defects with grade 1 anterolisthesis.   IMPRESSION: 1. No evidence of active GI bleed. 2. Postsurgical changes of Roux-en-Y gastric  bypass. Distended excluded portion of the stomach with heterogeneous and high attenuating content similar to prior CT. 3. Sigmoid diverticulosis. No bowel obstruction.     Electronically Signed   By: Vanetta Chou M.D.   On: 01/04/2024 12:28 Assessment:   Ileus versus acute GI bleed.  With drop in hemoglobin and increased tachycardia CT angiogram was ordered to ensure the bypass stomach contents were not old hematoma and patient continued to have an active GI bleed into the bypass limb.  CTA as above fortunately negative for active bleeding but will continue to trend hemoglobin in the meantime and monitor for signs of GI bleeding such as melena.  Case was also discussed with [facility] and [facility], [facility] accepted transfer patient for further evaluation of this persistent debris noted in her bypass stomach.  Pending transfer once bed is available.  Will continue supportive care in the meantime.  Case discussed with family and patient they are all in agreement.  Admitting  hospitalist updated as well.  labs/images/medications/previous chart entries reviewed personally and relevant changes/updates noted above.

## 2024-01-05 ENCOUNTER — Inpatient Hospital Stay: Payer: Medicare Other

## 2024-01-05 ENCOUNTER — Encounter: Payer: Self-pay | Admitting: Family Medicine

## 2024-01-05 DIAGNOSIS — N179 Acute kidney failure, unspecified: Secondary | ICD-10-CM

## 2024-01-05 DIAGNOSIS — K567 Ileus, unspecified: Secondary | ICD-10-CM | POA: Diagnosis not present

## 2024-01-05 DIAGNOSIS — E669 Obesity, unspecified: Secondary | ICD-10-CM | POA: Insufficient documentation

## 2024-01-05 DIAGNOSIS — E785 Hyperlipidemia, unspecified: Secondary | ICD-10-CM | POA: Diagnosis not present

## 2024-01-05 DIAGNOSIS — D62 Acute posthemorrhagic anemia: Secondary | ICD-10-CM | POA: Diagnosis not present

## 2024-01-05 DIAGNOSIS — K219 Gastro-esophageal reflux disease without esophagitis: Secondary | ICD-10-CM

## 2024-01-05 DIAGNOSIS — I10 Essential (primary) hypertension: Secondary | ICD-10-CM | POA: Insufficient documentation

## 2024-01-05 LAB — CBC WITH DIFFERENTIAL/PLATELET
Abs Immature Granulocytes: 0.06 10*3/uL (ref 0.00–0.07)
Basophils Absolute: 0 10*3/uL (ref 0.0–0.1)
Basophils Relative: 0 %
Eosinophils Absolute: 0.1 10*3/uL (ref 0.0–0.5)
Eosinophils Relative: 2 %
HCT: 22.3 % — ABNORMAL LOW (ref 36.0–46.0)
Hemoglobin: 7.2 g/dL — ABNORMAL LOW (ref 12.0–15.0)
Immature Granulocytes: 1 %
Lymphocytes Relative: 8 %
Lymphs Abs: 0.7 10*3/uL (ref 0.7–4.0)
MCH: 28.9 pg (ref 26.0–34.0)
MCHC: 32.3 g/dL (ref 30.0–36.0)
MCV: 89.6 fL (ref 80.0–100.0)
Monocytes Absolute: 0.8 10*3/uL (ref 0.1–1.0)
Monocytes Relative: 9 %
Neutro Abs: 7.7 10*3/uL (ref 1.7–7.7)
Neutrophils Relative %: 80 %
Platelets: 276 10*3/uL (ref 150–400)
RBC: 2.49 MIL/uL — ABNORMAL LOW (ref 3.87–5.11)
RDW: 14.3 % (ref 11.5–15.5)
WBC: 9.5 10*3/uL (ref 4.0–10.5)
nRBC: 0 % (ref 0.0–0.2)

## 2024-01-05 LAB — COMPREHENSIVE METABOLIC PANEL
ALT: 6 U/L (ref 0–44)
AST: 10 U/L — ABNORMAL LOW (ref 15–41)
Albumin: 2.5 g/dL — ABNORMAL LOW (ref 3.5–5.0)
Alkaline Phosphatase: 53 U/L (ref 38–126)
Anion gap: 8 (ref 5–15)
BUN: 23 mg/dL (ref 8–23)
CO2: 21 mmol/L — ABNORMAL LOW (ref 22–32)
Calcium: 8.1 mg/dL — ABNORMAL LOW (ref 8.9–10.3)
Chloride: 110 mmol/L (ref 98–111)
Creatinine, Ser: 0.89 mg/dL (ref 0.44–1.00)
GFR, Estimated: >60 mL/min (ref >60)
Glucose, Bld: 92 mg/dL (ref 70–99)
Potassium: 3.8 mmol/L (ref 3.5–5.1)
Sodium: 139 mmol/L (ref 135–145)
Total Bilirubin: 0.7 mg/dL (ref 0.0–1.2)
Total Protein: 5.3 g/dL — ABNORMAL LOW (ref 6.5–8.1)

## 2024-01-05 LAB — PREPARE RBC (CROSSMATCH)

## 2024-01-05 LAB — HEMOGLOBIN: Hemoglobin: 9.2 g/dL — ABNORMAL LOW (ref 12.0–15.0)

## 2024-01-05 LAB — MAGNESIUM: Magnesium: 1.9 mg/dL (ref 1.7–2.4)

## 2024-01-05 LAB — PHOSPHORUS: Phosphorus: 2.6 mg/dL (ref 2.5–4.6)

## 2024-01-05 LAB — ABO/RH: ABO/RH(D): O POS

## 2024-01-05 MED ORDER — SODIUM CHLORIDE 0.9% IV SOLUTION: Freq: Once | INTRAVENOUS | Status: AC

## 2024-01-05 MED ORDER — TECHNETIUM TC 99M-LABELED RED BLOOD CELLS IV KIT
20.0000 | PACK | Freq: Once | INTRAVENOUS | Status: AC | PRN
  Administered 2024-01-05: 22.19 via INTRAVENOUS

## 2024-01-05 NOTE — Assessment & Plan Note (Addendum)
Class II.  BMI 35.77

## 2024-01-05 NOTE — Assessment & Plan Note (Signed)
Hold losartan

## 2024-01-05 NOTE — Consult Note (Addendum)
 Chief Complaint: Patient was seen in consultation today for GI bleed  Referring Physician(s): Dr. Marnee Sink  Supervising Physician: Marland Silvas  Patient Status: [facility] - In-pt  History of Present Illness: [name] is a 68 y.o. 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  She developed acute onset abdominal pain on the morning of 01/03/24 and presented to [facility] ED for evaluation. Hgb 13.0 at baseline, 10.5 on presentation. CTA showed distended excluded stomach with hyperdense debris.  Repeat CTA Abd Pelv GI bleed protocol performed yesterday and again showed: 1. No evidence of active GI bleed. 2. Postsurgical changes of Roux-en-Y gastric bypass. Distended excluded portion of the stomach with heterogeneous and high attenuating content similar to prior CT. 3. Sigmoid diverticulosis. No bowel obstruction.  Due to complexity with prior history of REY, patient has been accepted in transfer to a tertiary care facility [facility]) however remains without bed placement and further work-up ongoing while pending.  IR consulted for possible angiogram with embolization.  Case reviewed by Dr. Marlena Sima who recommends a RBC tagged NM study for ongoing identification of source.   Patient assessed at bedside.  She confirms history as above.  She has been NPO this AM.  She is fully aware of plans for ongoing diagnostic work-up and possible procedure should source of bleed be identified.  She is agreeable to procedures as needed.    Past Medical History:  Diagnosis Date   Arthritis    Chicken pox    Depression    Heartburn    History of blood transfusion    History of hemorrhoids    History of kidney stones    IDA (iron  deficiency anemia)    Kidney stones    Morbid obesity (HCC)    s/p Gastric bypass    Past Surgical History:  Procedure Laterality Date   ABDOMINAL HYSTERECTOMY   1997   CERVICAL SPINE SURGERY  2009   Fusion   CESAREAN SECTION  1987   CHOLECYSTECTOMY     COLONOSCOPY  04/22/2007   COLONOSCOPY WITH PROPOFOL  N/A 11/30/2017   Procedure: COLONOSCOPY WITH PROPOFOL ;  Surgeon: Marshall Skeeter, MD;  Location: [facility] ENDOSCOPY;  Service: Endoscopy;  Laterality: N/A;   ESOPHAGOGASTRODUODENOSCOPY (EGD) WITH PROPOFOL  N/A 11/30/2017   Procedure: ESOPHAGOGASTRODUODENOSCOPY (EGD) WITH PROPOFOL ;  Surgeon: Marshall Skeeter, MD;  Location: [facility] ENDOSCOPY;  Service: Endoscopy;  Laterality: N/A;   GASTRIC BYPASS  2007   HERNIA REPAIR  2011   KNEE ARTHROSCOPY WITH MEDIAL MENISECTOMY Right 01/15/2022   Procedure: Right knee partial medial and lateral meniscectomies and loose body removal;  Surgeon: Molli Angelucci, MD;  Location: [facility] ORS;  Service: Orthopedics;  Laterality: Right;   OOPHORECTOMY     TONSILLECTOMY AND ADENOIDECTOMY     vulvar cysts     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    Allergies: Keflex [cephalexin], Penicillins, and Sulfa antibiotics  Medications: Prior to Admission medications   Medication Sig Start Date End Date Taking? Authorizing Provider  acetaminophen  (TYLENOL ) 500 MG tablet Take 500 mg by mouth every 6 (six) hours as needed.   Yes [provider]  ergocalciferol  (VITAMIN D2) 1.25 MG (50000 UT) capsule Take 50,000 Units by mouth once a week. 10/14/23 10/13/24 Yes [provider]  gabapentin  (NEURONTIN ) 300 MG capsule Take 300 mg by mouth at bedtime as needed (pain). 12/10/21  Yes [provider]  losartan (COZAAR) 25 MG tablet  Take 25 mg by mouth daily. 03/05/23  Yes [provider]  omeprazole  (PRILOSEC  OTC) 20 MG tablet Take 20 mg by mouth daily as needed (acid reflux).   Yes [provider]  Semaglutide-Weight Management 0.25 MG/0.5ML SOAJ Inject 0.25 mg into the skin once a week. 11/09/23  Yes [provider]  buPROPion  (WELLBUTRIN  XL) 300 MG 24 hr tablet Take 300 mg  by mouth daily. Patient not taking: Reported on 01/03/2024 12/24/21   [provider]  HYDROcodone -acetaminophen  (NORCO) 5-325 MG tablet Take 1 tablet by mouth every 6 (six) hours as needed for moderate pain. Patient not taking: Reported on 01/03/2024 01/15/22   Molli Angelucci, MD  topiramate  (TOPAMAX ) 50 MG tablet Take 50 mg by mouth 2 (two) times daily. Patient not taking: Reported on 01/03/2024 12/24/21   [provider]     Family History  Problem Relation Age of Onset   Arthritis Mother    Arthritis Father    Lung cancer Father    Arthritis Maternal Grandmother    Kidney disease Maternal Grandmother    Depression Maternal Grandmother    Breast cancer Sister 83       three times   Colon cancer Sister 28    Social History   Socioeconomic History   Marital status: Widowed    Spouse name: Not on file   Number of children: Not on file   Years of education: Not on file   Highest education level: Not on file  Occupational History   Not on file  Tobacco Use   Smoking status: Never   Smokeless tobacco: Never  Substance and Sexual Activity   Alcohol use: Yes    Alcohol/week: 0.0 - 1.0 standard drinks of alcohol   Drug use: No   Sexual activity: Not Currently  Other Topics Concern   Not on file  Social History Narrative   Not on file   Social Drivers of Health   Financial Resource Strain: Low Risk  (09/17/2023)   Received from [facility]   Overall Financial Resource Strain (CARDIA)    Difficulty of Paying Living Expenses: Not hard at all  Food Insecurity: No Food Insecurity (01/03/2024)   Hunger Vital Sign    Worried About Running Out of Food in the Last Year: Never true    Ran Out of Food in the Last Year: Never true  Transportation Needs: No Transportation Needs (01/03/2024)   PRAPARE - Administrator, Civil Service (Medical): No    Lack of Transportation (Non-Medical): No  Physical Activity: Not on file  Stress: Not on file   Social Connections: Moderately Isolated (01/03/2024)   Social Connection and Isolation Panel [NHANES]    Frequency of Communication with Friends and Family: More than three times a week    Frequency of Social Gatherings with Friends and Family: Twice a week    Attends Religious Services: 1 to 4 times per year    Active Member of [employer] or Organizations: No    Attends Banker Meetings: Never    Marital Status: Widowed     Review of Systems: A 12 point ROS discussed and pertinent positives are indicated in the HPI above.  All other systems are negative.  Review of Systems  Constitutional:  Negative for fatigue and fever.  Respiratory:  Negative for cough and shortness of breath.   Cardiovascular:  Negative for chest pain.  Gastrointestinal:  Positive for abdominal distention and abdominal pain. Negative for blood  in stool, diarrhea and vomiting.  Musculoskeletal:  Negative for back pain.  Psychiatric/Behavioral:  Negative for behavioral problems and confusion.     Vital Signs: BP (!) 153/91 (BP Location: Right Arm)   Pulse 92   Temp 98.9 F (37.2 C)   Resp 19   Ht 5\' 3"  (1.6 m)   Wt 202 lb (91.6 kg)   SpO2 94%   BMI 35.78 kg/m   Physical Exam Vitals and nursing note reviewed.  Constitutional:      General: She is not in acute distress.    Appearance: She is well-developed. She is not ill-appearing.  HENT:     Mouth/Throat:     Mouth: Mucous membranes are moist.     Pharynx: Oropharynx is clear.  Cardiovascular:     Rate and Rhythm: Normal rate and regular rhythm.  Pulmonary:     Effort: Pulmonary effort is normal. No respiratory distress.     Breath sounds: Normal breath sounds.  Abdominal:     General: Bowel sounds are absent. There is no distension.     Tenderness: There is abdominal tenderness (LUQ).  Neurological:     General: No focal deficit present.     Mental Status: She is alert and oriented to person, place, and time.  Psychiatric:         Mood and Affect: Mood normal.        Behavior: Behavior normal.      MD Evaluation Airway: WNL Heart: WNL Abdomen: WNL Chest/ Lungs: WNL ASA  Classification: 3 Mallampati/Airway Score: Two   Imaging: CT ANGIO GI BLEED Result Date: 01/04/2024 CLINICAL DATA:  Concern for upper GI bleed. EXAM: CTA ABDOMEN AND PELVIS WITHOUT AND WITH CONTRAST TECHNIQUE: Multidetector CT imaging of the abdomen and pelvis was performed using the standard protocol during bolus administration of intravenous contrast. Multiplanar reconstructed images and MIPs were obtained and reviewed to evaluate the vascular anatomy. RADIATION DOSE REDUCTION: This exam was performed according to the departmental dose-optimization program which includes automated exposure control, adjustment of the mA and/or kV according to patient size and/or use of iterative reconstruction technique. CONTRAST:  OMNIPAQUE  IOHEXOL  350 MG/ML SOLN COMPARISON:  CT abdomen pelvis dated 01/03/2024. FINDINGS: VASCULAR Aorta: Normal caliber aorta without aneurysm, dissection, vasculitis or significant stenosis. Celiac: Patent without evidence of aneurysm, dissection, vasculitis or significant stenosis. SMA: Patent without evidence of aneurysm, dissection, vasculitis or significant stenosis. Renals: Both renal arteries are patent without evidence of aneurysm, dissection, vasculitis, fibromuscular dysplasia or significant stenosis. IMA: Patent without evidence of aneurysm, dissection, vasculitis or significant stenosis. Inflow: Patent without evidence of aneurysm, dissection, vasculitis or significant stenosis. Proximal Outflow: The visualized proximal outflow is patent. Veins: The IVC is unremarkable. The SMV, splenic vein, and main portal vein are patent. No portal venous gas. Review of the MIP images confirms the above findings. NON-VASCULAR Lower chest: There are bibasilar atelectasis. There is coronary vascular calcification. No intra-abdominal free air.  Small ascites. There is diffuse infiltration of the upper mesentery and surrounding the stomach. Hepatobiliary: The liver is unremarkable.  Cholecystectomy. Pancreas: Unremarkable. No pancreatic ductal dilatation or surrounding inflammatory changes. Spleen: Normal in size without focal abnormality. Adrenals/Urinary Tract: The adrenal glands are unremarkable. Vascular calcification versus possible punctate nonobstructing left renal upper pole calculus. There is no hydronephrosis or obstructing stone on either side. Left renal cysts measure up to 4.5 cm in the upper pole. The visualized ureters appear unremarkable. The urinary bladder is grossly unremarkable. Stomach/Bowel: There is postsurgical changes of  Roux-en-Y gastric bypass. An enteric tube has been placed with tip in the proximal jejunum of the Roux limb. There is distended excluded portion of the stomach with heterogeneous and high attenuating content. No evidence of active GI bleed. There is sigmoid diverticulosis. No bowel obstruction. The appendix is unremarkable as visualized. Lymphatic: No adenopathy. Reproductive: Hysterectomy.  No suspicious adnexal mass. Other: Midline vertical anterior pelvic wall incisional scar. A ventral hernia repair mesh noted. Musculoskeletal: Degenerative changes of the spine. No acute osseous pathology. Bilateral L5 pars defects with grade 1 anterolisthesis. IMPRESSION: 1. No evidence of active GI bleed. 2. Postsurgical changes of Roux-en-Y gastric bypass. Distended excluded portion of the stomach with heterogeneous and high attenuating content similar to prior CT. 3. Sigmoid diverticulosis. No bowel obstruction. Electronically Signed   By: Angus Bark M.D.   On: 01/04/2024 12:28   DG Abd 2 Views Result Date: 01/03/2024 CLINICAL DATA:  68 year old female with abdominal pain, enteric tube placement. EXAM: ABDOMEN - 2 VIEW COMPARISON:  CT Abdomen and Pelvis 0333 hours today. FINDINGS: Portable AP upright view of the  chest at 0557 hours. Supine views of the abdomen at 0602 hours. Enteric tube has been placed into the stomach, side hole at the level of the gastric fundus. Underlying gastric bypass changes better demonstrated by CT. Stable bowel gas pattern since that time. Stable cholecystectomy clips. Excreted IV contrast in the urinary bladder. Lungs and mediastinal contours appear stable, no confluent pulmonary opacity. Stable visualized osseous structures. IMPRESSION: 1. Satisfactory enteric tube placement into the stomach. 2. Underlying gastric bypass changes as demonstrated by CT. Stable bowel gas pattern. 3. No acute cardiopulmonary abnormality. Electronically Signed   By: Marlise Simpers M.D.   On: 01/03/2024 06:19   CT ABDOMEN PELVIS W CONTRAST Result Date: 01/03/2024 CLINICAL DATA:  Upper abdominal pain, nausea EXAM: CT ABDOMEN AND PELVIS WITH CONTRAST TECHNIQUE: Multidetector CT imaging of the abdomen and pelvis was performed using the standard protocol following bolus administration of intravenous contrast. RADIATION DOSE REDUCTION: This exam was performed according to the departmental dose-optimization program which includes automated exposure control, adjustment of the mA and/or kV according to patient size and/or use of iterative reconstruction technique. CONTRAST:  80mL OMNIPAQUE  IOHEXOL  300 MG/ML  SOLN COMPARISON:  12/13/2014 FINDINGS: Lower chest: Mild compressive atelectasis in the medial bilateral lower lobes. Hepatobiliary: Liver is within normal limits. Status post cholecystectomy. No intrahepatic or extrahepatic dilatation. Pancreas: Within normal limits. Spleen: Within normal limits. Adrenals/Urinary Tract: Adrenal glands are within normal limits. Right kidney is within normal limits. Two left upper pole renal cysts measuring up to 5.0 cm (series 2/image 30), measuring simple fluid density, benign (Bosniak I). No follow-up is recommended. No hydronephrosis. Bladder is within normal limits. Stomach/Bowel: Status  post gastric bypass. Distended excluded stomach with hyperdense debris. No evidence of bowel obstruction. Normal appendix (series 2/image 55). Left colonic diverticulosis, without evidence of diverticulitis. Vascular/Lymphatic: No evidence of abdominal aortic aneurysm. No suspicious abdominopelvic lymphadenopathy. Reproductive: Status post hysterectomy. No adnexal masses. Other: Small volume upper abdominal ascites. Musculoskeletal: Degenerative changes of the visualized thoracolumbar spine. Grade 2 spondylolisthesis at L5-S1. IMPRESSION: Status post gastric bypass.  No evidence of bowel obstruction. Distended excluded stomach with hyperdense debris. Consider endoscopic evaluation of the excluded stomach, as clinically warranted. Small volume upper abdominal ascites. Additional ancillary findings as above. Electronically Signed   By: Zadie Herter M.D.   On: 01/03/2024 03:51   DG Chest Port 1 View Result Date: 01/03/2024 CLINICAL DATA:  Abdominal pain. EXAM:  PORTABLE CHEST 1 VIEW COMPARISON:  None Available. FINDINGS: The heart size and mediastinal contours are within normal limits. Both lungs are clear. A radiopaque fusion plate and screws are seen overlying the cervical spine. Multilevel degenerative changes are noted throughout the thoracic spine. IMPRESSION: No active cardiopulmonary disease. Electronically Signed   By: Virgle Grime M.D.   On: 01/03/2024 02:39    Labs:  CBC: Recent Labs    01/03/24 0203 01/04/24 0604 01/04/24 1414 01/05/24 0507  WBC 11.6* 9.1  --  9.5  HGB 9.9* 7.4* 8.1* 7.2*  HCT 32.7* 23.4* 25.4* 22.3*  PLT 416* 271  --  276    COAGS: No results for input(s): "INR", "APTT" in the last 8760 hours.  BMP: Recent Labs    01/03/24 0203 01/04/24 0604 01/05/24 0507  NA 138 140 139  K 4.2 4.1 3.8  CL 105 111 110  CO2 19* 22 21*  GLUCOSE 211* 105* 92  BUN 29* 27* 23  CALCIUM 8.6* 7.9* 8.1*  CREATININE 1.46* 1.04* 0.89  GFRNONAA 39* 59* >60    LIVER  FUNCTION TESTS: Recent Labs    01/03/24 0203 01/04/24 0604 01/05/24 0507  BILITOT 0.7 0.5 0.7  AST 20 11* 10*  ALT 10 7 6   ALKPHOS 70 52 53  PROT 6.8 5.3* 5.3*  ALBUMIN 3.7 2.6* 2.5*    TUMOR MARKERS: No results for input(s): "AFPTM", "CEA", "CA199", "CHROMGRNA" in the last 8760 hours.  Assessment and Plan: Abdominal pain in the setting of prior REY gastric bypass in 2007 Patient admitted with acute onset abdominal pain.  CT imaging showed concern for debris in the gastric remnant which may represent a GI bleed.   This is further supported by her hemodynamic instability showing declining Hgb, now 7.2 with plans for 1u PRBC today.  Thus far CT Abd with contrast negative for active extravasation x2.   Patient with plans to transfer to tertiary care center, however no beds available/delayed and diagnostic work-up continues at present.   Plan is for RBC tagged NM study today with IR available for intervention should study show positive bleed.  Patient aware that possible embolization dependent on results of the study.  She is fully aware of the complexity of her case and is agreeable to imaging and/or procedures as warranted by medical team.  She elects for intervention if positive study and has signed consent reflecting such.   She is NPO.   She does have renal dysfunction at baseline, but thus far without signs of further impact on renal function with contrast scans x2. She is aware of plans to use contrast with angiogram if we proceed.   The Risks and benefits of embolization were discussed with the patient including, but not limited to bleeding, infection, vascular injury, post operative pain, or contrast induced renal failure.  This procedure involves the use of X-rays and because of the nature of the planned procedure, it is possible that we will have prolonged use of X-ray fluoroscopy.  Potential radiation risks to you include (but are not limited to) the following: - A  slightly elevated risk for cancer several years later in life. This risk is typically less than 0.5% percent. This risk is low in comparison to the normal incidence of human cancer, which is 33% for women and 50% for men according to the [employer] Society. - Radiation induced injury can include skin redness, resembling a rash, tissue breakdown / ulcers and hair loss (which can be temporary or permanent).  The likelihood of either of these occurring depends on the difficulty of the procedure and whether you are sensitive to radiation due to previous procedures, disease, or genetic conditions.   IF your procedure requires a prolonged use of radiation, you will be notified and given written instructions for further action.  It is your responsibility to monitor the irradiated area for the 2 weeks following the procedure and to notify your physician if you are concerned that you have suffered a radiation induced injury.    All of the patient's questions were answered, patient is agreeable to proceed. Consent signed and in chart.  Advance Care Plan: The advanced care plan/surrogate decision maker was discussed at the time of visit and documented in the medical record.     Thank you for this interesting consult.  I greatly enjoyed meeting [name] and look forward to participating in their care.  A copy of this report was sent to the requesting provider on this date.  Electronically Signed: Wayne Brunker Sue-Ellen Fareedah Mahler, PA 01/05/2024, 9:21 AM   I spent a total of 40 Minutes    in face to face in clinical consultation, greater than 50% of which was counseling/coordinating care for abdominal pain.

## 2024-01-05 NOTE — Progress Notes (Signed)
 Subjective:  CC: [name] is a 68 y.o. female  Hospital stay day 2,   ileus versus GI bleed  HPI: No acute events reported overnight.  Patient continues to have pain in the epigastric region, unchanged.  Hemoglobin did drop again overnight, but denies any melena or hematochezia.  Passing flatus.  ROS:  General: Denies weight loss, weight gain, fatigue, fevers, chills, and night sweats. Heart: Denies chest pain, palpitations, racing heart, irregular heartbeat, leg pain or swelling, and decreased activity tolerance. Respiratory: Denies breathing difficulty, shortness of breath, wheezing, cough, and sputum. GI: Denies change in appetite, heartburn, nausea, vomiting, constipation, diarrhea, and blood in stool. GU: Denies difficulty urinating, pain with urinating, urgency, frequency, blood in urine.   Objective:   Temp:  [98.6 F (37 C)-100.2 F (37.9 C)] 98.9 F (37.2 C) (01/15 0906) Pulse Rate:  [92-110] 105 (01/15 1044) Resp:  [16-19] 19 (01/15 0906) BP: (129-153)/(72-95) 129/72 (01/15 1044) SpO2:  [93 %-99 %] 93 % (01/15 1044)     Height: 5\' 3"  (160 cm) Weight: 91.6 kg BMI (Calculated): 35.79   Intake/Output this shift:  No intake or output data in the 24 hours ending 01/05/24 1046   Constitutional :  alert, cooperative, appears stated age, and no distress  Respiratory:  clear to auscultation bilaterally  Cardiovascular:  regular rate and rhythm  Gastrointestinal: Soft, no guarding, focal tenderness to palpation epigastric region unchanged. .   Skin: Cool and moist.   Psychiatric: Normal affect, non-agitated, not confused       LABS:     Latest Ref Rng & Units 01/05/2024    5:07 AM 01/04/2024    6:04 AM 01/03/2024    2:03 AM  CMP  Glucose 70 - 99 mg/dL 92  161  096   BUN 8 - 23 mg/dL 23  27  29    Creatinine 0.44 - 1.00 mg/dL 0.45  4.09  8.11   Sodium 135 - 145 mmol/L 139  140  138   Potassium 3.5 - 5.1 mmol/L 3.8  4.1  4.2   Chloride 98 - 111 mmol/L 110  111  105    CO2 22 - 32 mmol/L 21  22  19    Calcium 8.9 - 10.3 mg/dL 8.1  7.9  8.6   Total Protein 6.5 - 8.1 g/dL 5.3  5.3  6.8   Total Bilirubin 0.0 - 1.2 mg/dL 0.7  0.5  0.7   Alkaline Phos 38 - 126 U/L 53  52  70   AST 15 - 41 U/L 10  11  20    ALT 0 - 44 U/L 6  7  10        Latest Ref Rng & Units 01/05/2024    5:07 AM 01/04/2024    2:14 PM 01/04/2024    6:04 AM  CBC  WBC 4.0 - 10.5 K/uL 9.5   9.1   Hemoglobin 12.0 - 15.0 g/dL 7.2  8.1  7.4   Hematocrit 36.0 - 46.0 % 22.3  25.4  23.4   Platelets 150 - 400 K/uL 276   271     RADS: CLINICAL DATA:  Concern for upper GI bleed.   EXAM: CTA ABDOMEN AND PELVIS WITHOUT AND WITH CONTRAST   TECHNIQUE: Multidetector CT imaging of the abdomen and pelvis was performed using the standard protocol during bolus administration of intravenous contrast. Multiplanar reconstructed images and MIPs were obtained and reviewed to evaluate the vascular anatomy.   RADIATION DOSE REDUCTION: This exam was performed according  to the departmental dose-optimization program which includes automated exposure control, adjustment of the mA and/or kV according to patient size and/or use of iterative reconstruction technique.   CONTRAST:  OMNIPAQUE  IOHEXOL  350 MG/ML SOLN   COMPARISON:  CT abdomen pelvis dated 01/03/2024.   FINDINGS: VASCULAR   Aorta: Normal caliber aorta without aneurysm, dissection, vasculitis or significant stenosis.   Celiac: Patent without evidence of aneurysm, dissection, vasculitis or significant stenosis.   SMA: Patent without evidence of aneurysm, dissection, vasculitis or significant stenosis.   Renals: Both renal arteries are patent without evidence of aneurysm, dissection, vasculitis, fibromuscular dysplasia or significant stenosis.   IMA: Patent without evidence of aneurysm, dissection, vasculitis or significant stenosis.   Inflow: Patent without evidence of aneurysm, dissection, vasculitis or significant stenosis.    Proximal Outflow: The visualized proximal outflow is patent.   Veins: The IVC is unremarkable. The SMV, splenic vein, and main portal vein are patent. No portal venous gas.   Review of the MIP images confirms the above findings.   NON-VASCULAR   Lower chest: There are bibasilar atelectasis. There is coronary vascular calcification.   No intra-abdominal free air. Small ascites. There is diffuse infiltration of the upper mesentery and surrounding the stomach.   Hepatobiliary: The liver is unremarkable.  Cholecystectomy.   Pancreas: Unremarkable. No pancreatic ductal dilatation or surrounding inflammatory changes.   Spleen: Normal in size without focal abnormality.   Adrenals/Urinary Tract: The adrenal glands are unremarkable. Vascular calcification versus possible punctate nonobstructing left renal upper pole calculus. There is no hydronephrosis or obstructing stone on either side. Left renal cysts measure up to 4.5 cm in the upper pole. The visualized ureters appear unremarkable. The urinary bladder is grossly unremarkable.   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 An enteric tube has been placed with tip in the proximal jejunum of the Roux limb. There is distended excluded portion of the stomach with heterogeneous and high attenuating content. No evidence of active GI bleed. There is sigmoid diverticulosis. No bowel obstruction. The appendix is unremarkable as visualized.   Lymphatic: No adenopathy.   Reproductive: Hysterectomy.  No suspicious adnexal mass.   Other: Midline vertical anterior pelvic wall incisional scar. A ventral hernia repair mesh noted.   Musculoskeletal: Degenerative changes of the spine. No acute osseous pathology. Bilateral L5 pars defects with grade 1 anterolisthesis.   IMPRESSION: 1. No evidence of active GI bleed. 2. 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 3. Sigmoid diverticulosis. No bowel obstruction.     Electronically Signed   By: Angus Bark M.D.   On: 01/04/2024 12:28 Assessment:   Likely GI bleed into bypassed portion of stomach and duodenum.  Hemoglobin drop noted again this a.m.  Patient is pending transfusion as well as a GI bleeding scan after discussion with interventional radiology.  While awaiting transfer for surgical management to do, if bleeding scan shows area of concern, interventional radiology may be able to isolate bleeding vessel amenable to embolization.  Discussed case with patient and she verbalized understanding.  Patient will still need a evaluation by [facility] surgery team to further investigate causes of persistent distention of the stomach even if the GI bleeding can be localized via IR approach.  Discussed the case with the [facility] surgery team this morning as well.  They also recommended possible endoscopic evaluation if IR is not an option.  They requested we completely  rule out the more typical causes of GI bleed such as a marginal ulcer in the pouch remnant.  Recommendation discussed with GI provider and he will be available if needed. labs/images/medications/previous chart entries reviewed personally and relevant changes/updates noted above.

## 2024-01-05 NOTE — Plan of Care (Signed)
  Problem: Education: Goal: Knowledge of General Education information will improve Description Including pain rating scale, medication(s)/side effects and non-pharmacologic comfort measures Outcome: Progressing   Problem: Clinical Measurements: Goal: Ability to maintain clinical measurements within normal limits will improve Outcome: Progressing   Problem: Clinical Measurements: Goal: Diagnostic test results will improve Outcome: Progressing   Problem: Activity: Goal: Risk for activity intolerance will decrease Outcome: Progressing   Problem: Coping: Goal: Level of anxiety will decrease Outcome: Progressing

## 2024-01-05 NOTE — Assessment & Plan Note (Addendum)
Creatinine 1.46 on presentation and down to 0.91

## 2024-01-05 NOTE — Progress Notes (Signed)
 Progress Note   Patient: [name] UYQ:[id number] DOB: Feb 14, 1956 DOA: 01/03/2024     2 DOS: the patient was seen and examined on 01/05/2024   Brief hospital course: 68 year old female with osteoarthritis, depression, obesity.  She has a history of Roux-en-Y gastric bypass.  Patient presented to the emergency room complaining of abdominal pain.  Patient also had nausea vomiting and generalized weakness.  She is on outpatient Ozempic and recently increased the dose.  Hemoglobin on presentation 9.9.  CT scan on presentation showed status post gastric bypass, no evidence of bowel obstruction, distended excluded stomach with hyperdense debris.  1/14.  CT scan for GI bleed was negative for GI bleed.  Still showed distended excluded portion of the stomach with heterogeneous and high attenuating content similar to prior CT scan. 1/15.  Bleeding scan negative.  Hemoglobin dropped down to 7.2.  1 unit of packed red blood cells transfused.  Benefits and risk of transfusion explained to patient.  Endoscopy will be set up for tomorrow.  If no source of bleeding seen may end up needing transfer to [facility].  Assessment and Plan: * Acute blood loss anemia Today's hemoglobin dipped down to 7.2.  Transfuse 1 unit of packed red blood cells.  Benefits and risk of transfusion explained.  Bleeding scan negative.  Endoscopy will be set up for tomorrow.  Patient on IV Protonix  twice daily.  Bleeding may be in the excluded stomach and may end up needing transfer to [facility] for further evaluation.  AKI (acute kidney injury) (HCC) Creatinine 1.46 on presentation and down to 0.89.  Ileus (HCC) Clear liquid diet.  Patient has not passed any blood.  Dyslipidemia Hold statin  Essential hypertension Hold losartan.  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  BMI 35.78  GERD without esophagitis Continue PPI        Subjective: Patient seen this morning.  Not feeling too well.  Hemoglobin went down to 7.2.  Blood transfusion  ordered.  Admitted with abdominal pain and found to have dropping hemoglobin.  Physical Exam: Vitals:   01/05/24 1130 01/05/24 1131 01/05/24 1346 01/05/24 1410  BP: 127/79 127/79 130/85 132/84  Pulse: 99 93    Resp: 16 16    Temp:      TempSrc:      SpO2: 94% 95%    Weight:      Height:       Physical Exam HENT:     Head: Normocephalic.     Mouth/Throat:     Pharynx: No oropharyngeal exudate.  Eyes:     General: Lids are normal.     Comments: Pale conjunctiva  Cardiovascular:     Rate and Rhythm: Normal rate and regular rhythm.     Heart sounds: Normal heart sounds, S1 normal and S2 normal.  Pulmonary:     Breath sounds: No decreased breath sounds, wheezing, rhonchi or rales.  Abdominal:     Palpations: Abdomen is soft.     Tenderness: There is abdominal tenderness in the epigastric area.  Musculoskeletal:     Right lower leg: No swelling.     Left lower leg: No swelling.  Skin:    General: Skin is warm.     Findings: No rash.  Neurological:     Mental Status: She is alert and oriented to person, place, and time.     Data Reviewed: Bleeding scan negative, creatinine 0.89, hemoglobin 7.2  Family Communication: Updated son on the phone  Disposition: Status is: Inpatient Remains inpatient appropriate  because: Bleeding scan negative today.  EGD will be done tomorrow morning then if that is negative may end up being transferred to [facility].  Transfusing a unit of blood today.  Planned Discharge Destination: To be determined    Time spent: 28 minutes  Author: Verla Glaze, MD 01/05/2024 4:47 PM  For on call review [email or web address].

## 2024-01-05 NOTE — Plan of Care (Signed)

## 2024-01-05 NOTE — Discharge Instructions (Signed)
 Your nurse navigator, Cleotilde Spadaccini, can be reached at [phone]

## 2024-01-05 NOTE — Hospital Course (Addendum)
68 year old female with osteoarthritis, depression, obesity.  She has a history of Roux-en-Y gastric bypass.  Patient presented to the emergency room complaining of abdominal pain.  Patient also had nausea vomiting and generalized weakness.  She is on outpatient Ozempic and recently increased the dose.  Hemoglobin on presentation 9.9.  CT scan on presentation showed status post gastric bypass, no evidence of bowel obstruction, distended excluded stomach with hyperdense debris.  1/14.  CT scan for GI bleed was negative for GI bleed.  Still showed distended excluded portion of the stomach with heterogeneous and high attenuating content similar to prior CT scan. 1/15.  Bleeding scan negative.  Hemoglobin dropped down to 7.2.  1 unit of packed red blood cells transfused.  Benefits and risk of transfusion explained to patient.  Endoscopy will be set up for tomorrow.  If no source of bleeding seen may end up needing transfer to [facility].  Evening hemoglobin 9.2. 1/16.  Morning hemoglobin 9.1.  Patient did have melena overnight.  EGD did not see any source of bleeding.  Bleeding likely from the excluded stomach.  Patient will transfer to [facility] once bed available.

## 2024-01-05 NOTE — Assessment & Plan Note (Addendum)
Yesterday's hemoglobin dipped down to 7.2.  Transfused 1 unit of packed red blood cells with hemoglobin coming up to 9.2 last night and stable at 9.1 this morning. Patient on IV Protonix twice daily.  EGD did not show any source of bleeding.  Bleeding likely in the excluded stomach and patient had an episode of melena last night.

## 2024-01-06 ENCOUNTER — Inpatient Hospital Stay: Payer: Medicare Other | Admitting: Certified Registered Nurse Anesthetist

## 2024-01-06 ENCOUNTER — Encounter
Admission: EM | Disposition: A | Discharge status: Other Acute Inpatient Hospital | Payer: Self-pay | Source: Home / Self Care | Attending: Family Medicine

## 2024-01-06 ENCOUNTER — Encounter: Payer: Self-pay | Admitting: Family Medicine

## 2024-01-06 DIAGNOSIS — K922 Gastrointestinal hemorrhage, unspecified: Secondary | ICD-10-CM | POA: Diagnosis not present

## 2024-01-06 DIAGNOSIS — D62 Acute posthemorrhagic anemia: Secondary | ICD-10-CM | POA: Diagnosis not present

## 2024-01-06 DIAGNOSIS — K567 Ileus, unspecified: Secondary | ICD-10-CM | POA: Diagnosis not present

## 2024-01-06 DIAGNOSIS — N179 Acute kidney failure, unspecified: Secondary | ICD-10-CM | POA: Diagnosis not present

## 2024-01-06 HISTORY — PX: ESOPHAGOGASTRODUODENOSCOPY (EGD) WITH PROPOFOL: SHX5813

## 2024-01-06 LAB — CBC WITH DIFFERENTIAL/PLATELET
Abs Immature Granulocytes: 0.07 10*3/uL (ref 0.00–0.07)
Basophils Absolute: 0 10*3/uL (ref 0.0–0.1)
Basophils Relative: 0 %
Eosinophils Absolute: 0.3 10*3/uL (ref 0.0–0.5)
Eosinophils Relative: 4 %
HCT: 28 % — ABNORMAL LOW (ref 36.0–46.0)
Hemoglobin: 9.1 g/dL — ABNORMAL LOW (ref 12.0–15.0)
Immature Granulocytes: 1 %
Lymphocytes Relative: 11 %
Lymphs Abs: 1 10*3/uL (ref 0.7–4.0)
MCH: 28.8 pg (ref 26.0–34.0)
MCHC: 32.5 g/dL (ref 30.0–36.0)
MCV: 88.6 fL (ref 80.0–100.0)
Monocytes Absolute: 0.9 10*3/uL (ref 0.1–1.0)
Monocytes Relative: 10 %
Neutro Abs: 6.8 10*3/uL (ref 1.7–7.7)
Neutrophils Relative %: 74 %
Platelets: 321 10*3/uL (ref 150–400)
RBC: 3.16 MIL/uL — ABNORMAL LOW (ref 3.87–5.11)
RDW: 14.8 % (ref 11.5–15.5)
WBC: 9.1 10*3/uL (ref 4.0–10.5)
nRBC: 0 % (ref 0.0–0.2)

## 2024-01-06 LAB — COMPREHENSIVE METABOLIC PANEL
ALT: 8 U/L (ref 0–44)
AST: 13 U/L — ABNORMAL LOW (ref 15–41)
Albumin: 2.6 g/dL — ABNORMAL LOW (ref 3.5–5.0)
Alkaline Phosphatase: 83 U/L (ref 38–126)
Anion gap: 6 (ref 5–15)
BUN: 18 mg/dL (ref 8–23)
CO2: 23 mmol/L (ref 22–32)
Calcium: 8.1 mg/dL — ABNORMAL LOW (ref 8.9–10.3)
Chloride: 106 mmol/L (ref 98–111)
Creatinine, Ser: 0.91 mg/dL (ref 0.44–1.00)
GFR, Estimated: >60 mL/min (ref >60)
Glucose, Bld: 83 mg/dL (ref 70–99)
Potassium: 4 mmol/L (ref 3.5–5.1)
Sodium: 135 mmol/L (ref 135–145)
Total Bilirubin: 0.9 mg/dL (ref 0.0–1.2)
Total Protein: 5.7 g/dL — ABNORMAL LOW (ref 6.5–8.1)

## 2024-01-06 LAB — BPAM RBC
Blood Product Expiration Date: 202502102359
ISSUE DATE / TIME: 202501151059
Unit Type and Rh: 5100

## 2024-01-06 LAB — TYPE AND SCREEN
ABO/RH(D): O POS
Antibody Screen: NEGATIVE
Unit division: 0

## 2024-01-06 LAB — MAGNESIUM: Magnesium: 1.9 mg/dL (ref 1.7–2.4)

## 2024-01-06 LAB — PHOSPHORUS: Phosphorus: 2.5 mg/dL (ref 2.5–4.6)

## 2024-01-06 SURGERY — ESOPHAGOGASTRODUODENOSCOPY (EGD) WITH PROPOFOL
Anesthesia: General

## 2024-01-06 MED ORDER — LIDOCAINE HCL (CARDIAC) PF 100 MG/5ML IV SOSY
PREFILLED_SYRINGE | INTRAVENOUS | Status: DC | PRN
  Administered 2024-01-06: 100 mg via INTRAVENOUS

## 2024-01-06 MED ORDER — TRAZODONE HCL 50 MG PO TABS: 25.0000 mg | ORAL_TABLET | Freq: Every evening | ORAL | Status: DC | PRN

## 2024-01-06 MED ORDER — MAGNESIUM HYDROXIDE 400 MG/5ML PO SUSP: 30.0000 mL | Freq: Every day | ORAL | 0 refills | Status: DC | PRN

## 2024-01-06 MED ORDER — ONDANSETRON HCL 4 MG/2ML IJ SOLN: 4.0000 mg | Freq: Four times a day (QID) | INTRAMUSCULAR | Status: DC | PRN

## 2024-01-06 MED ORDER — PROPOFOL 10 MG/ML IV BOLUS
INTRAVENOUS | Status: DC | PRN
  Administered 2024-01-06: 50 mg via INTRAVENOUS

## 2024-01-06 MED ORDER — ONDANSETRON HCL 4 MG PO TABS: 4.0000 mg | ORAL_TABLET | Freq: Four times a day (QID) | ORAL | Status: DC | PRN

## 2024-01-06 MED ORDER — PROPOFOL 500 MG/50ML IV EMUL
INTRAVENOUS | Status: DC | PRN
  Administered 2024-01-06: 140 ug/kg/min via INTRAVENOUS

## 2024-01-06 MED ORDER — SODIUM CHLORIDE 0.9 % IV SOLN: INTRAVENOUS | Status: DC

## 2024-01-06 MED ORDER — PANTOPRAZOLE SODIUM 40 MG IV SOLR: 40.0000 mg | Freq: Two times a day (BID) | INTRAVENOUS | Status: DC

## 2024-01-06 NOTE — Plan of Care (Signed)
  Problem: Education: Goal: Knowledge of General Education information will improve Description: Including pain rating scale, medication(s)/side effects and non-pharmacologic comfort measures Outcome: Progressing   Problem: Clinical Measurements: Goal: Ability to maintain clinical measurements within normal limits will improve Outcome: Progressing   Problem: Activity: Goal: Risk for activity intolerance will decrease Outcome: Progressing   Problem: Nutrition: Goal: Adequate nutrition will be maintained Outcome: Progressing   Problem: Coping: Goal: Level of anxiety will decrease Outcome: Progressing   Problem: Pain Management: Goal: General experience of comfort will improve Outcome: Progressing   Problem: Safety: Goal: Ability to remain free from injury will improve Outcome: Progressing   Problem: Skin Integrity: Goal: Risk for impaired skin integrity will decrease Outcome: Progressing

## 2024-01-06 NOTE — Plan of Care (Signed)

## 2024-01-06 NOTE — Progress Notes (Signed)
Patient came back from procedure via bed in stable condition.

## 2024-01-06 NOTE — Discharge Summary (Signed)
Physician Discharge Summary   Patient: [name] MRN: [id number] DOB: Nov 01, 1956  Admit date:     01/03/2024  Discharge date: 01/06/24  Discharge Physician: Alford Highland   PCP: Lauro Regulus, MD   Recommendations at discharge:   Transfer to [facility] when bed available.  Discharge Diagnoses: Principal Problem:   Acute blood loss anemia Active Problems:   Ileus (HCC)   AKI (acute kidney injury) (HCC)   Dyslipidemia   Depression   GERD without esophagitis   Obesity (BMI 30-39.9)   Essential hypertension    Hospital Course: 68 year old female with osteoarthritis, depression, obesity.  She has a history of Roux-en-Y gastric bypass.  Patient presented to the emergency room complaining of abdominal pain.  Patient also had nausea vomiting and generalized weakness.  She is on outpatient Ozempic and recently increased the dose.  Hemoglobin on presentation 9.9.  CT scan on presentation showed status post gastric bypass, no evidence of bowel obstruction, distended excluded stomach with hyperdense debris.  1/14.  CT scan for GI bleed was negative for GI bleed.  Still showed distended excluded portion of the stomach with heterogeneous and high attenuating content similar to prior CT scan. 1/15.  Bleeding scan negative.  Hemoglobin dropped down to 7.2.  1 unit of packed red blood cells transfused.  Benefits and risk of transfusion explained to patient.  Endoscopy will be set up for tomorrow.  If no source of bleeding seen may end up needing transfer to [facility].  Evening hemoglobin 9.2. 1/16.  Morning hemoglobin 9.1.  Patient did have melena overnight.  EGD did not see any source of bleeding.  Bleeding likely from the excluded stomach.  Patient will transfer to [facility] once bed available.   Assessment and Plan: * Acute blood loss anemia Yesterday's hemoglobin dipped down to 7.2.  Transfused 1 unit of packed red blood cells with hemoglobin coming up to 9.2 last night and stable at 9.1 this  morning. Patient on IV Protonix twice daily.  EGD did not show any source of bleeding.  Bleeding likely in the excluded stomach and patient had an episode of melena last night.  AKI (acute kidney injury) (HCC) Creatinine 1.46 on presentation and down to 0.91  Ileus (HCC) Improved.  Patient had a bowel movement.  Continue clear liquid diet.   Dyslipidemia Hold statin  Essential hypertension Hold losartan.  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  BMI 35.77  GERD without esophagitis Continue iv PPI         Consultants: General Surgery, gastroenterology, interventional radiology Procedures performed: EGD Disposition: Transfer to [facility] when bed available Diet recommendation:  Clear liquid diet DISCHARGE MEDICATION: Allergies as of 01/06/2024       Reactions   Keflex [cephalexin] Swelling   Swelling of fingers   Penicillins Swelling   Swelling in fingers   Sulfa Antibiotics Swelling   Swelling in fingers        Medication List     STOP taking these medications    buPROPion 300 MG 24 hr tablet Commonly known as: WELLBUTRIN XL   HYDROcodone-acetaminophen 5-325 MG tablet Commonly known as: Norco   losartan 25 MG tablet Commonly known as: COZAAR   omeprazole 20 MG tablet Commonly known as: PRILOSEC OTC   Semaglutide-Weight Management 0.25 MG/0.5ML Soaj   topiramate 50 MG tablet Commonly known as: TOPAMAX       TAKE these medications    acetaminophen 500 MG tablet Commonly known as: TYLENOL Take 500 mg by mouth every 6 (  six) hours as needed.   ergocalciferol 1.25 MG (50000 UT) capsule Commonly known as: VITAMIN D2 Take 50,000 Units by mouth once a week.   gabapentin 300 MG capsule Commonly known as: NEURONTIN Take 300 mg by mouth at bedtime as needed (pain).   magnesium hydroxide 400 MG/5ML suspension Commonly known as: MILK OF MAGNESIA Take 30 mLs by mouth daily as needed for mild constipation.   ondansetron 4 MG tablet Commonly known as:  ZOFRAN Take 1 tablet (4 mg total) by mouth every 6 (six) hours as needed for nausea.   ondansetron 4 MG/2ML Soln injection Commonly known as: ZOFRAN Inject 2 mLs (4 mg total) into the vein every 6 (six) hours as needed for nausea.   pantoprazole 40 MG injection Commonly known as: PROTONIX Inject 40 mg into the vein every 12 (twelve) hours.   traZODone 50 MG tablet Commonly known as: DESYREL Take 0.5 tablets (25 mg total) by mouth at bedtime as needed for sleep.        Follow-up Information     Lauro Regulus, MD Follow up.   Specialty: Internal Medicine Why: Hospital follow up Contact information: [address] [facility] [place] [address] [phone]                Discharge Exam: Ceasar Mons Weights   01/03/24 0210 01/06/24 1316  Weight: 91.6 kg 91.6 kg   Physical Exam HENT:     Head: Normocephalic.     Mouth/Throat:     Pharynx: No oropharyngeal exudate.  Eyes:     General: Lids are normal.     Conjunctiva/sclera: Conjunctivae normal.  Cardiovascular:     Rate and Rhythm: Normal rate and regular rhythm.     Heart sounds: Normal heart sounds, S1 normal and S2 normal.  Pulmonary:     Breath sounds: No decreased breath sounds, wheezing, rhonchi or rales.  Abdominal:     Palpations: Abdomen is soft.     Tenderness: There is abdominal tenderness in the epigastric area.  Musculoskeletal:     Right lower leg: No swelling.     Left lower leg: No swelling.  Skin:    General: Skin is warm.     Findings: No rash.  Neurological:     Mental Status: She is alert and oriented to person, place, and time.      Condition at discharge: stable  The results of significant diagnostics from this hospitalization (including imaging, microbiology, ancillary and laboratory) are listed below for reference.   Imaging Studies: NM GI Blood Loss Result Date: 01/05/2024 CLINICAL DATA:  Epigastric pain. History of gastric bypass with high  attenuation content distending the excluded portion of the stomach. Negative CTA for bleed. EXAM: NUCLEAR MEDICINE GASTROINTESTINAL BLEEDING SCAN TECHNIQUE: Sequential abdominal images were obtained over 2 hours following intravenous administration of Tc-49m labeled red blood cells. RADIOPHARMACEUTICALS:  22.19 mCi Tc-53m pertechnetate in-vitro labeled red cells. COMPARISON:  CT from previous day FINDINGS: Physiologic distribution of radiopharmaceutical. No active GI bleed. IMPRESSION: Negative tagged red blood cell study for GI bleed. Electronically Signed   By: Corlis Leak M.D.   On: 01/05/2024 16:16   CT ANGIO GI BLEED Result Date: 01/04/2024 CLINICAL DATA:  Concern for upper GI bleed. EXAM: CTA ABDOMEN AND PELVIS WITHOUT AND WITH CONTRAST TECHNIQUE: Multidetector CT imaging of the abdomen and pelvis was performed using the standard protocol during bolus administration of intravenous contrast. Multiplanar reconstructed images and MIPs were obtained and reviewed to evaluate  the vascular anatomy. RADIATION DOSE REDUCTION: This exam was performed according to the departmental dose-optimization program which includes automated exposure control, adjustment of the mA and/or kV according to patient size and/or use of iterative reconstruction technique. CONTRAST:  OMNIPAQUE IOHEXOL 350 MG/ML SOLN COMPARISON:  CT abdomen pelvis dated 01/03/2024. FINDINGS: VASCULAR Aorta: Normal caliber aorta without aneurysm, dissection, vasculitis or significant stenosis. Celiac: Patent without evidence of aneurysm, dissection, vasculitis or significant stenosis. SMA: Patent without evidence of aneurysm, dissection, vasculitis or significant stenosis. Renals: Both renal arteries are patent without evidence of aneurysm, dissection, vasculitis, fibromuscular dysplasia or significant stenosis. IMA: Patent without evidence of aneurysm, dissection, vasculitis or significant stenosis. Inflow: Patent without evidence of aneurysm,  dissection, vasculitis or significant stenosis. Proximal Outflow: The visualized proximal outflow is patent. Veins: The IVC is unremarkable. The SMV, splenic vein, and main portal vein are patent. No portal venous gas. Review of the MIP images confirms the above findings. NON-VASCULAR Lower chest: There are bibasilar atelectasis. There is coronary vascular calcification. No intra-abdominal free air. Small ascites. There is diffuse infiltration of the upper mesentery and surrounding the stomach. Hepatobiliary: The liver is unremarkable.  Cholecystectomy. Pancreas: Unremarkable. No pancreatic ductal dilatation or surrounding inflammatory changes. Spleen: Normal in size without focal abnormality. Adrenals/Urinary Tract: The adrenal glands are unremarkable. Vascular calcification versus possible punctate nonobstructing left renal upper pole calculus. There is no hydronephrosis or obstructing stone on either side. Left renal cysts measure up to 4.5 cm in the upper pole. The visualized ureters appear unremarkable. The urinary bladder is grossly unremarkable. 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 An enteric tube has been placed with tip in the proximal jejunum of the Roux limb. There is distended excluded portion of the stomach with heterogeneous and high attenuating content. No evidence of active GI bleed. There is sigmoid diverticulosis. No bowel obstruction. The appendix is unremarkable as visualized. Lymphatic: No adenopathy. Reproductive: Hysterectomy.  No suspicious adnexal mass. Other: Midline vertical anterior pelvic wall incisional scar. A ventral hernia repair mesh noted. Musculoskeletal: Degenerative changes of the spine. No acute osseous pathology. Bilateral L5 pars defects with grade 1 anterolisthesis. IMPRESSION: 1. No evidence of active GI bleed. 2. Postsurgical changes of Roux-en-Y gastric bypass. Distended excluded portion of the stomach with heterogeneous and high  attenuating content similar to prior CT. 3. Sigmoid diverticulosis. No bowel obstruction. Electronically Signed   By: Elgie Collard M.D.   On: 01/04/2024 12:28   DG Abd 2 Views Result Date: 01/03/2024 CLINICAL DATA:  68 year old female with abdominal pain, enteric tube placement. EXAM: ABDOMEN - 2 VIEW COMPARISON:  CT Abdomen and Pelvis 0333 hours today. FINDINGS: Portable AP upright view of the chest at 0557 hours. Supine views of the abdomen at 0602 hours. Enteric tube has been placed into the stomach, side hole at the level of the gastric fundus. Underlying gastric bypass changes better demonstrated by CT. Stable bowel gas pattern since that time. Stable cholecystectomy clips. Excreted IV contrast in the urinary bladder. Lungs and mediastinal contours appear stable, no confluent pulmonary opacity. Stable visualized osseous structures. IMPRESSION: 1. Satisfactory enteric tube placement into the stomach. 2. Underlying gastric bypass changes as demonstrated by CT. Stable bowel gas pattern. 3. No acute cardiopulmonary abnormality. Electronically Signed   By: Odessa Fleming M.D.   On: 01/03/2024 06:19   CT ABDOMEN PELVIS W CONTRAST Result Date: 01/03/2024 CLINICAL DATA:  Upper abdominal pain, nausea EXAM: CT ABDOMEN AND PELVIS WITH CONTRAST TECHNIQUE: Multidetector CT imaging of the  abdomen and pelvis was performed using the standard protocol following bolus administration of intravenous contrast. RADIATION DOSE REDUCTION: This exam was performed according to the departmental dose-optimization program which includes automated exposure control, adjustment of the mA and/or kV according to patient size and/or use of iterative reconstruction technique. CONTRAST:  80mL OMNIPAQUE IOHEXOL 300 MG/ML  SOLN COMPARISON:  12/13/2014 FINDINGS: Lower chest: Mild compressive atelectasis in the medial bilateral lower lobes. Hepatobiliary: Liver is within normal limits. Status post cholecystectomy. No intrahepatic or extrahepatic  dilatation. Pancreas: Within normal limits. Spleen: Within normal limits. Adrenals/Urinary Tract: Adrenal glands are within normal limits. Right kidney is within normal limits. Two left upper pole renal cysts measuring up to 5.0 cm (series 2/image 30), measuring simple fluid density, benign (Bosniak I). No follow-up is recommended. No hydronephrosis. Bladder is within normal limits. Stomach/Bowel: Status post gastric bypass. Distended excluded stomach with hyperdense debris. No evidence of bowel obstruction. Normal appendix (series 2/image 55). Left colonic diverticulosis, without evidence of diverticulitis. Vascular/Lymphatic: No evidence of abdominal aortic aneurysm. No suspicious abdominopelvic lymphadenopathy. Reproductive: Status post hysterectomy. No adnexal masses. Other: Small volume upper abdominal ascites. Musculoskeletal: Degenerative changes of the visualized thoracolumbar spine. Grade 2 spondylolisthesis at L5-S1. IMPRESSION: Status post gastric bypass.  No evidence of bowel obstruction. Distended excluded stomach with hyperdense debris. Consider endoscopic evaluation of the excluded stomach, as clinically warranted. Small volume upper abdominal ascites. Additional ancillary findings as above. Electronically Signed   By: Charline Bills M.D.   On: 01/03/2024 03:51   DG Chest Port 1 View Result Date: 01/03/2024 CLINICAL DATA:  Abdominal pain. EXAM: PORTABLE CHEST 1 VIEW COMPARISON:  None Available. FINDINGS: The heart size and mediastinal contours are within normal limits. Both lungs are clear. A radiopaque fusion plate and screws are seen overlying the cervical spine. Multilevel degenerative changes are noted throughout the thoracic spine. IMPRESSION: No active cardiopulmonary disease. Electronically Signed   By: Aram Candela M.D.   On: 01/03/2024 02:39    Microbiology: Results for orders placed or performed during the hospital encounter of 12/31/10  Surgical pcr screen     Status: None    Collection Time: 12/31/10 12:34 PM  Result Value Ref Range Status   MRSA, PCR NEGATIVE NEGATIVE Final   Staphylococcus aureus  NEGATIVE Final    NEGATIVE        The Xpert SA Assay (FDA approved for NASAL specimens only), is one component of a comprehensive surveillance program.  It is not intended to diagnose infection nor to guide or monitor treatment.    Labs: CBC: Recent Labs  Lab 01/03/24 0203 01/04/24 0604 01/04/24 1414 01/05/24 0507 01/05/24 1804 01/06/24 0433  WBC 11.6* 9.1  --  9.5  --  9.1  NEUTROABS 10.0* 7.4  --  7.7  --  6.8  HGB 9.9* 7.4* 8.1* 7.2* 9.2* 9.1*  HCT 32.7* 23.4* 25.4* 22.3*  --  28.0*  MCV 94.8 90.0  --  89.6  --  88.6  PLT 416* 271  --  276  --  321   Basic Metabolic Panel: Recent Labs  Lab 01/03/24 0203 01/04/24 0604 01/05/24 0507 01/06/24 0433  NA 138 140 139 135  K 4.2 4.1 3.8 4.0  CL 105 111 110 106  CO2 19* 22 21* 23  GLUCOSE 211* 105* 92 83  BUN 29* 27* 23 18  CREATININE 1.46* 1.04* 0.89 0.91  CALCIUM 8.6* 7.9* 8.1* 8.1*  MG  --  1.9 1.9 1.9  PHOS  --  3.5  2.6 2.5   Liver Function Tests: Recent Labs  Lab 01/03/24 0203 01/04/24 0604 01/05/24 0507 01/06/24 0433  AST 20 11* 10* 13*  ALT 10 7 6 8   ALKPHOS 70 52 53 83  BILITOT 0.7 0.5 0.7 0.9  PROT 6.8 5.3* 5.3* 5.7*  ALBUMIN 3.7 2.6* 2.5* 2.6*   CBG: Recent Labs  Lab 01/04/24 1207 01/04/24 1607  GLUCAP 95 94    Discharge time spent: greater than 30 minutes.  Signed: Alford Highland, MD [facility] 01/06/2024

## 2024-01-06 NOTE — Anesthesia Procedure Notes (Signed)
Date/Time: 01/06/2024 1:50 PM  Performed by: Malva Cogan, CRNAPre-anesthesia Checklist: Patient identified, Emergency Drugs available, Suction available, Patient being monitored and Timeout performed Patient Re-evaluated:Patient Re-evaluated prior to induction Oxygen Delivery Method: Nasal cannula Induction Type: IV induction Airway Equipment and Method: Bite block Placement Confirmation: CO2 detector and positive ETCO2

## 2024-01-06 NOTE — Anesthesia Postprocedure Evaluation (Signed)
Anesthesia Post Note  Patient: [name]  Procedure(s) Performed: ESOPHAGOGASTRODUODENOSCOPY (EGD) WITH PROPOFOL  Patient location during evaluation: PACU Anesthesia Type: General Level of consciousness: awake and alert Pain management: pain level controlled Vital Signs Assessment: post-procedure vital signs reviewed and stable Respiratory status: spontaneous breathing, nonlabored ventilation and respiratory function stable Cardiovascular status: blood pressure returned to baseline and stable Postop Assessment: no apparent nausea or vomiting Anesthetic complications: no   No notable events documented.   Last Vitals:  Vitals:   01/06/24 1421 01/06/24 1447  BP: (!) 124/58 137/74  Pulse:    Resp:    Temp: (!) 36.1 C   SpO2:      Last Pain:  Vitals:   01/06/24 1421  TempSrc: Temporal  PainSc: 0-No pain                 Foye Deer

## 2024-01-06 NOTE — Progress Notes (Signed)
The patient had an upper endoscopy with the scope passed as far as he could go into the small bowel without any sign of any old blood or fresh blood.  There is no ulcerations or marginal ulcers seen at the anastomosis from her Roux-en-Y.  The patient tolerated the procedure well.  Nothing further to do from a GI point of view since the remnant stomach cannot be reached with any liquid we have at this hospital.  I have discussed the case with surgery and the hospitalist about transferring the patient to a tertiary care center.  I will sign off.  Please call if any further GI concerns or questions.  We would like to thank you for the opportunity to participate in the care of [name].

## 2024-01-06 NOTE — Anesthesia Preprocedure Evaluation (Addendum)
Anesthesia Evaluation  Patient identified by MRN, date of birth, ID band Patient awake    Reviewed: Allergy & Precautions, H&P , NPO status , Patient's Chart, lab work & pertinent test results  Airway Mallampati: II  TM Distance: >3 FB Neck ROM: full    Dental no notable dental hx.    Pulmonary neg pulmonary ROS   Pulmonary exam normal        Cardiovascular hypertension,  Rhythm:Irregular Rate:Normal  Pt has an irregular pulse today but she denies shortness of breath, chest pain, leg swelling. We discussed the potential need for an EKG.    Neuro/Psych  PSYCHIATRIC DISORDERS      negative neurological ROS     GI/Hepatic Neg liver ROS,GERD  ,,  Endo/Other  negative endocrine ROS    Renal/GU negative Renal ROS  negative genitourinary   Musculoskeletal   Abdominal  (+)  Abdomen: soft. NG tube in place since Monday. Pt denies abdominal bloating, distension, nausea.   Peds  Hematology  (+) Blood dyscrasia, anemia   Anesthesia Other Findings 1 prbc 01/05/2024 Pt is s/p Gastric bypass and has Moderate melena from Likely GI bleed into bypassed portion of stomach and duodenum.  Past Medical History: No date: Arthritis No date: Chicken pox No date: Depression No date: Heartburn No date: History of blood transfusion No date: History of hemorrhoids No date: History of kidney stones No date: IDA (iron deficiency anemia) No date: Kidney stones No date: Morbid obesity (HCC)     Comment:  s/p Gastric bypass  Past Surgical History: 1997: ABDOMINAL HYSTERECTOMY 2009: CERVICAL SPINE SURGERY     Comment:  Fusion 1987: CESAREAN SECTION No date: CHOLECYSTECTOMY 04/22/2007: COLONOSCOPY 11/30/2017: COLONOSCOPY WITH PROPOFOL; N/A     Comment:  Procedure: COLONOSCOPY WITH PROPOFOL;  Surgeon: [name], MD;  Location: [facility] ENDOSCOPY;  Service:               Endoscopy;  Laterality: N/A; 11/30/2017:  ESOPHAGOGASTRODUODENOSCOPY (EGD) WITH PROPOFOL; N/A     Comment:  Procedure: ESOPHAGOGASTRODUODENOSCOPY (EGD) WITH               PROPOFOL;  Surgeon: [name], MD;  Location:               [facility] ENDOSCOPY;  Service: Endoscopy;  Laterality: N/A; 2007: GASTRIC BYPASS 2011: HERNIA REPAIR 01/15/2022: KNEE ARTHROSCOPY WITH MEDIAL MENISECTOMY; Right     Comment:  Procedure: Right knee partial medial and lateral               meniscectomies and loose body removal;  Surgeon: Kennedy Bucker, MD;  Location: [facility] ORS;  Service: Orthopedics;               Laterality: Right; No date: OOPHORECTOMY No date: TONSILLECTOMY AND ADENOIDECTOMY No date: vulvar cysts     Comment:  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  BMI    Body Mass Index: 35.78 kg/m      Reproductive/Obstetrics negative OB ROS                             Anesthesia Physical Anesthesia Plan  ASA: 3  Anesthesia Plan: General   Post-op Pain Management: Minimal or no pain anticipated   Induction: Intravenous  PONV Risk Score and Plan: Propofol infusion  and TIVA  Airway Management Planned: Natural Airway  Additional Equipment:   Intra-op Plan:   Post-operative Plan:   Informed Consent: I have reviewed the patients History and Physical, chart, labs and discussed the procedure including the risks, benefits and alternatives for the proposed anesthesia with the patient or authorized representative who has indicated his/her understanding and acceptance.     Dental Advisory Given  Plan Discussed with: CRNA and Surgeon  Anesthesia Plan Comments:         Anesthesia Quick Evaluation

## 2024-01-06 NOTE — Progress Notes (Signed)
Interventional Radiology Brief Note:  NM study negative yesterday for active GI bleed.  Hgb stable this AM after unit of PRBC.  Angiogram deferred at this time.  No planned procedure in IR at this time.   Loyce Dys, MS RD PA-C

## 2024-01-06 NOTE — Care Management Important Message (Signed)
Important Message  Patient Details  Name: [name] MRN: [id number] Date of Birth: July 02, 1956   Important Message Given:  Yes - Medicare IM     Cristela Blue, CMA 01/06/2024, 10:31 AM

## 2024-01-06 NOTE — Progress Notes (Signed)
Subjective:  CC: [name] is a 68 y.o. female  Hospital stay day 3,   ileus versus GI bleed  HPI: Moderate melena noted last night.  Tolerated clears.    ROS:  General: Denies weight loss, weight gain, fatigue, fevers, chills, and night sweats. Heart: Denies chest pain, palpitations, racing heart, irregular heartbeat, leg pain or swelling, and decreased activity tolerance. Respiratory: Denies breathing difficulty, shortness of breath, wheezing, cough, and sputum. GI: Denies change in appetite, heartburn, nausea, vomiting, constipation, diarrhea, and blood in stool. GU: Denies difficulty urinating, pain with urinating, urgency, frequency, blood in urine.   Objective:   Temp:  [98.1 F (36.7 C)-99.6 F (37.6 C)] 99.6 F (37.6 C) (01/16 0440) Pulse Rate:  [81-105] 93 (01/16 0440) Resp:  [16-19] 17 (01/16 0440) BP: (120-157)/(72-110) 120/80 (01/16 0440) SpO2:  [92 %-99 %] 98 % (01/16 0440)     Height: 5\' 3"  (160 cm) Weight: 91.6 kg BMI (Calculated): 35.79   Intake/Output this shift:   Intake/Output Summary (Last 24 hours) at 01/06/2024 0738 Last data filed at 01/05/2024 1500 Gross per 24 hour  Intake 450 ml  Output --  Net 450 ml     Constitutional :  alert, cooperative, appears stated age, and no distress  Respiratory:  clear to auscultation bilaterally  Cardiovascular:  regular rate and rhythm  Gastrointestinal: Soft, no guarding, focal tenderness to palpation epigastric region unchanged. .   Skin: Cool and moist.   Psychiatric: Normal affect, non-agitated, not confused       LABS:     Latest Ref Rng & Units 01/06/2024    4:33 AM 01/05/2024    5:07 AM 01/04/2024    6:04 AM  CMP  Glucose 70 - 99 mg/dL 83  92  161   BUN 8 - 23 mg/dL 18  23  27    Creatinine 0.44 - 1.00 mg/dL 0.96  0.45  4.09   Sodium 135 - 145 mmol/L 135  139  140   Potassium 3.5 - 5.1 mmol/L 4.0  3.8  4.1   Chloride 98 - 111 mmol/L 106  110  111   CO2 22 - 32 mmol/L 23  21  22    Calcium 8.9 -  10.3 mg/dL 8.1  8.1  7.9   Total Protein 6.5 - 8.1 g/dL 5.7  5.3  5.3   Total Bilirubin 0.0 - 1.2 mg/dL 0.9  0.7  0.5   Alkaline Phos 38 - 126 U/L 83  53  52   AST 15 - 41 U/L 13  10  11    ALT 0 - 44 U/L 8  6  7        Latest Ref Rng & Units 01/06/2024    4:33 AM 01/05/2024    6:04 PM 01/05/2024    5:07 AM  CBC  WBC 4.0 - 10.5 K/uL 9.1   9.5   Hemoglobin 12.0 - 15.0 g/dL 9.1  9.2  7.2   Hematocrit 36.0 - 46.0 % 28.0   22.3   Platelets 150 - 400 K/uL 321   276     RADS: CLINICAL DATA:  Epigastric pain. History of gastric bypass with high attenuation content distending the excluded portion of the stomach. Negative CTA for bleed.   EXAM: NUCLEAR MEDICINE GASTROINTESTINAL BLEEDING SCAN   TECHNIQUE: Sequential abdominal images were obtained over 2 hours following intravenous administration of Tc-54m labeled red blood cells.   RADIOPHARMACEUTICALS:  22.19 mCi Tc-35m pertechnetate in-vitro labeled red cells.   COMPARISON:  CT from previous day   FINDINGS: Physiologic distribution of radiopharmaceutical. No active GI bleed.   IMPRESSION: Negative tagged red blood cell study for GI bleed.     Electronically Signed   By: Corlis Leak M.D.   On: 01/05/2024 16:16   Assessment:   Likely GI bleed into bypassed portion of stomach and duodenum.  Hgb stable.  Melena likely from previous bleeding episode.  Pending EGD today per GI.  Will continue to monitor and wait transfer to [facility]   labs/images/medications/previous chart entries reviewed personally and relevant changes/updates noted above.

## 2024-01-06 NOTE — Transfer of Care (Signed)
Immediate Anesthesia Transfer of Care Note  Patient: [name]  Procedure(s) Performed: ESOPHAGOGASTRODUODENOSCOPY (EGD) WITH PROPOFOL  Patient Location: PACU  Anesthesia Type:General  Level of Consciousness: drowsy  Airway & Oxygen Therapy: Patient Spontanous Breathing and Patient connected to nasal cannula oxygen  Post-op Assessment: Report given to RN and Post -op Vital signs reviewed and stable  Post vital signs: Reviewed and stable  Last Vitals:  Vitals Value Taken Time  BP 124/58 01/06/24 1421  Temp    Pulse 80 01/06/24 1423  Resp 17 01/06/24 1423  SpO2 94 % 01/06/24 1423  Vitals shown include unfiled device data.  Last Pain:  Vitals:   01/06/24 1316  TempSrc: Temporal  PainSc: 7       Patients Stated Pain Goal: 0 (01/05/24 2134)  Complications: No notable events documented.

## 2024-01-06 NOTE — Op Note (Signed)
[facility] Patient Name: [name] Procedure Date: 01/06/2024 1:45 PM MRN: [id number] Account #: [id number] Date of Birth: [birth year] Admit Type: Outpatient Age: 68 Room: [facility] ENDO ROOM 4 Gender: Female Note Status: Finalized Instrument Name: Patton Salles Endoscope [id number] Procedure:             Upper GI endoscopy Indications:           Acute post hemorrhagic anemia Providers:             Midge Minium MD, MD Referring MD:          Marya Amsler. Dareen Piano MD, MD (Referring MD) Medicines:             Propofol per Anesthesia Complications:         No immediate complications. Procedure:             Pre-Anesthesia Assessment:                        - Prior to the procedure, a History and Physical was                         performed, and patient medications and allergies were                         reviewed. The patient's tolerance of previous                         anesthesia was also reviewed. The risks and benefits                         of the procedure and the sedation options and risks                         were discussed with the patient. All questions were                         answered, and informed consent was obtained. Prior                         Anticoagulants: The patient has taken no anticoagulant                         or antiplatelet agents. ASA Grade Assessment: II - A                         patient with mild systemic disease. After reviewing                         the risks and benefits, the patient was deemed in                         satisfactory condition to undergo the procedure.                        After obtaining informed consent, the endoscope was                         passed under direct vision. Throughout the procedure,  the patient's blood pressure, pulse, and oxygen                         saturations were monitored continuously. The Endoscope                         was introduced through  the mouth, and advanced to the                         jejunum. The upper GI endoscopy was accomplished                         without difficulty. The patient tolerated the                         procedure well. Findings:      The examined esophagus was normal.      Evidence of a Roux-en-Y gastrojejunostomy was found. The gastrojejunal       anastomosis was characterized by healthy appearing mucosa. This was       traversed. The pouch-to-jejunum limb was characterized by healthy       appearing mucosa. The jejunojejunal anastomosis was characterized by       healthy appearing mucosa.      The examined jejunum was normal. Impression:            - Normal esophagus.                        - Roux-en-Y gastrojejunostomy with gastrojejunal                         anastomosis characterized by healthy appearing mucosa.                        - Normal examined jejunum.                        - No specimens collected. Recommendation:        - Return patient to hospital ward for ongoing care.                        - Clear liquid diet.                        - Continue present medications.                        - Consider transfer to [facility] for further care. Procedure Code(s):     --- Professional ---                        [phone], Esophagogastroduodenoscopy, flexible,                         transoral; diagnostic, including collection of                         specimen(s) by brushing or washing, when performed                         (separate procedure) Diagnosis Code(s):     ---  Professional ---                        D62, Acute posthemorrhagic anemia                        Z98.0, Intestinal bypass and anastomosis status CPT copyright 2022 American Medical Association. All rights reserved. The codes documented in this report are preliminary and upon coder review may  be revised to meet current compliance requirements. Midge Minium MD, MD 01/06/2024 2:20:53 PM This report has been signed  electronically. Number of Addenda: 0 Note Initiated On: 01/06/2024 1:45 PM Estimated Blood Loss:  Estimated blood loss: none.      [facility]

## 2024-01-06 NOTE — Progress Notes (Signed)
Patient sent for procedure via bed in stable condition.

## 2024-01-06 NOTE — Progress Notes (Signed)
Heart rate has decreased from the 110's to now 97-105, and now also regular per monitor.

## 2024-01-07 ENCOUNTER — Encounter: Payer: Self-pay | Admitting: Gastroenterology

## 2024-01-07 NOTE — Progress Notes (Signed)
Patient is being transferred to [facility] via carelink team. Report was called to the admitting Nurse sierra on unit 1800. Patietn going to bed 8129. Assisted to pack her personal belongings. Cell phone and charge in her bag. IV remain intact.

## 2024-01-07 NOTE — Plan of Care (Signed)
  Problem: Education: Goal: Knowledge of General Education information will improve Description: Including pain rating scale, medication(s)/side effects and non-pharmacologic comfort measures Outcome: Progressing   Problem: Health Behavior/Discharge Planning: Goal: Ability to manage health-related needs will improve Outcome: Progressing   Problem: Activity: Goal: Risk for activity intolerance will decrease Outcome: Progressing   Problem: Nutrition: Goal: Adequate nutrition will be maintained Outcome: Progressing   Problem: Coping: Goal: Level of anxiety will decrease Outcome: Progressing   Problem: Elimination: Goal: Will not experience complications related to bowel motility Outcome: Progressing   Problem: Pain Management: Goal: General experience of comfort will improve Outcome: Progressing   Problem: Safety: Goal: Ability to remain free from injury will improve Outcome: Progressing   Problem: Skin Integrity: Goal: Risk for impaired skin integrity will decrease Outcome: Progressing

## 2024-06-14 ENCOUNTER — Inpatient Hospital Stay

## 2024-06-14 ENCOUNTER — Encounter: Payer: Self-pay | Admitting: Internal Medicine

## 2024-06-14 ENCOUNTER — Inpatient Hospital Stay: Attending: Internal Medicine | Admitting: Internal Medicine

## 2024-06-14 VITALS — BP 124/88 | HR 65 | Temp 95.6°F | Resp 12 | Ht 63.0 in | Wt 205.4 lb

## 2024-06-14 DIAGNOSIS — E538 Deficiency of other specified B group vitamins: Secondary | ICD-10-CM | POA: Diagnosis not present

## 2024-06-14 DIAGNOSIS — Z803 Family history of malignant neoplasm of breast: Secondary | ICD-10-CM | POA: Insufficient documentation

## 2024-06-14 DIAGNOSIS — D509 Iron deficiency anemia, unspecified: Secondary | ICD-10-CM | POA: Diagnosis present

## 2024-06-14 DIAGNOSIS — Z800 Family history of malignant neoplasm of digestive organs: Secondary | ICD-10-CM | POA: Insufficient documentation

## 2024-06-14 DIAGNOSIS — Z801 Family history of malignant neoplasm of trachea, bronchus and lung: Secondary | ICD-10-CM | POA: Diagnosis not present

## 2024-06-14 DIAGNOSIS — D649 Anemia, unspecified: Secondary | ICD-10-CM | POA: Insufficient documentation

## 2024-06-14 DIAGNOSIS — N183 Chronic kidney disease, stage 3 unspecified: Secondary | ICD-10-CM | POA: Diagnosis not present

## 2024-06-14 DIAGNOSIS — E559 Vitamin D deficiency, unspecified: Secondary | ICD-10-CM | POA: Insufficient documentation

## 2024-06-14 NOTE — Assessment & Plan Note (Addendum)
#   Anemia- Hb-symptomatic.  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  Recommend barimelt+ iron  [over-the-counter medication/can buy online]. 2 a day- under the tongue.    Discussed regarding IV iron  infusion/Venofer . Discussed the potential acute infusion reactions with IV iron ; which are quite rare.  Patient understands the risk; will proceed with infusions. No prior infusions.   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     # vit D def- on ergo weekly as nephrology; along with barimelt. Will repeat vit D levels.  # CKD- III-on advil [Dr.Korrapati]- stable.   Thank you Dr. Lenon for allowing me to participate in the care of your pleasant patient. Please do not hesitate to contact me with questions or concerns in the interim.  # DISPOSITION: Friday preference # NO labs today # weekly venofer  x 4- if possible this friday # follow up 2 months - MD; labs- cbc/bmp;b12 ; vit D levels;  possible venofer - Dr.B

## 2024-06-14 NOTE — Progress Notes (Signed)
 Fatigue/weakness: YES Dyspena: NO Light headedness: NO Blood in stool: NOT SINCE GI BLEED  Pt has had labs from Dr. Lenon at [facility] and nephrology Dr. Fonda everywhere).

## 2024-06-14 NOTE — Patient Instructions (Signed)
Recommend barimelt+ iron [over-the-counter medication/can buy online].  2 a day- under the tongue. These pills need to dissolve under the tongue/and should be absorbed into your bloodstream.

## 2024-06-14 NOTE — Progress Notes (Signed)
 [facility] CONSULT NOTE  Patient Care Team: Lenon Layman ORN, MD as PCP - General (Internal Medicine) Dessa, Reyes ORN, MD (General Surgery) Rennie Cindy SAUNDERS, MD as Consulting Physician (Oncology)  CHIEF COMPLAINTS/PURPOSE OF CONSULTATION: ANEMIA  2025 01/10/2024 01/10/2024              Ferritin 6 Low    -- -- 47 -- Load older lab results  Folate (Folic Acid ) -- -- 10.1 -- --   Iron  -- 25 Low    -- -- 12 Low      Total Iron  Binding Capacity (TIBC) -- 518.0 High    -- -- 256 Low      Percent Transferrin Saturation -- -- -- -- 5 Low      Transferrin -- 370.0 High    -- -- --   % Saturation -- 5         HEMATOLOGY HISTORY  # ANEMIA[Hb; MCV-platelets- WBC; Iron  sat; ferritin;  GFR- CT/US - ;   #  Symptomatic Iron  deficiency Anemia, patient intolerant of oral iron , also 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 (labs on 02/26/15 reported hemoglobin 10.4, MCV 77, platelets 258, WBC 6400, 64% neutrophils, 18% lymphocytes, 15% monocytes, 2% eosinophils, 1% basophils, serum iron  low at 20, TIBC 428, TIBC very low at 5%)   2. B12 deficiency - diagnosed June 2016.  HISTORY OF PRESENTING ILLNESS: Patient ambulating-independently.  Alone    [name] 68 y.o.  female pleasant patient with Hx of gastric by pass; and Hx of GIB of remnant ulcer [JAN 2025]; CKD stage III is  been referred to us  for further evaluation of anemia.  Patient complains of shortness of breath with exertion.  Also complains of excessive fatigue.  Patient not on oral iron  because of intolerance.  She is on vitamin D  and also B12.   Review of Systems  Constitutional:  Positive for malaise/fatigue. Negative for chills, diaphoresis, fever and weight loss.  HENT:  Negative for nosebleeds and sore throat.   Eyes:  Negative for double vision.  Respiratory:  Negative for cough, hemoptysis, sputum production, shortness of breath and wheezing.    Cardiovascular:  Negative for chest pain, palpitations, orthopnea and leg swelling.  Gastrointestinal:  Negative for abdominal pain, blood in stool, constipation, diarrhea, heartburn, melena, nausea and vomiting.  Genitourinary:  Negative for dysuria, frequency and urgency.  Musculoskeletal:  Negative for back pain and joint pain.  Skin: Negative.  Negative for itching and rash.  Neurological:  Negative for dizziness, tingling, focal weakness, weakness and headaches.  Endo/Heme/Allergies:  Does not bruise/bleed easily.  Psychiatric/Behavioral:  Negative for depression. The patient is not nervous/anxious and does not have insomnia.      MEDICAL HISTORY:  Past Medical History:  Diagnosis Date   Arthritis    Chicken pox    Depression    Heartburn    History of blood transfusion    History of hemorrhoids    History of kidney stones    IDA (iron  deficiency anemia)    Kidney stones    Morbid obesity (HCC)    s/p Gastric bypass    SURGICAL HISTORY: Past Surgical History:  Procedure Laterality Date   ABDOMINAL HYSTERECTOMY  1997   CERVICAL SPINE SURGERY  2009   Fusion   CESAREAN SECTION  1987   CHOLECYSTECTOMY     COLONOSCOPY  04/22/2007   COLONOSCOPY WITH PROPOFOL  N/A 11/30/2017   Procedure: COLONOSCOPY WITH PROPOFOL ;  Surgeon: Dessa,  Reyes ORN, MD;  Location: [facility] ENDOSCOPY;  Service: Endoscopy;  Laterality: N/A;   ESOPHAGOGASTRODUODENOSCOPY (EGD) WITH PROPOFOL  N/A 11/30/2017   Procedure: ESOPHAGOGASTRODUODENOSCOPY (EGD) WITH PROPOFOL ;  Surgeon: Dessa Reyes ORN, MD;  Location: [facility] ENDOSCOPY;  Service: Endoscopy;  Laterality: N/A;   ESOPHAGOGASTRODUODENOSCOPY (EGD) WITH PROPOFOL  N/A 01/06/2024   Procedure: ESOPHAGOGASTRODUODENOSCOPY (EGD) WITH PROPOFOL ;  Surgeon: Jinny Carmine, MD;  Location: [facility] ENDOSCOPY;  Service: Endoscopy;  Laterality: N/A;   GASTRIC BYPASS  2007   HERNIA REPAIR  2011   KNEE ARTHROSCOPY WITH MEDIAL MENISECTOMY Right 01/15/2022   Procedure: Right knee  partial medial and lateral meniscectomies and loose body removal;  Surgeon: Kathlynn Sharper, MD;  Location: [facility] ORS;  Service: Orthopedics;  Laterality: Right;   OOPHORECTOMY     TONSILLECTOMY AND ADENOIDECTOMY     vulvar cysts     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    SOCIAL HISTORY: Social History   Socioeconomic History   Marital status: Widowed    Spouse name: Not on file   Number of children: Not on file   Years of education: Not on file   Highest education level: Not on file  Occupational History   Not on file  Tobacco Use   Smoking status: Never   Smokeless tobacco: Never  Vaping Use   Vaping status: Never Used  Substance and Sexual Activity   Alcohol use: Yes    Alcohol/week: 0.0 - 1.0 standard drinks of alcohol   Drug use: No   Sexual activity: Not Currently  Other Topics Concern   Not on file  Social History Narrative   Not on file   Social Drivers of Health   Financial Resource Strain: Low Risk  (09/17/2023)   Received from [facility]   Overall Financial Resource Strain (CARDIA)    Difficulty of Paying Living Expenses: Not hard at all  Food Insecurity: No Food Insecurity (06/14/2024)   Hunger Vital Sign    Worried About Running Out of Food in the Last Year: Never true    Ran Out of Food in the Last Year: Never true  Transportation Needs: No Transportation Needs (01/03/2024)   PRAPARE - Administrator, Civil Service (Medical): No    Lack of Transportation (Non-Medical): No  Physical Activity: Not on file  Stress: Not on file  Social Connections: Moderately Isolated (01/03/2024)   Social Connection and Isolation Panel    Frequency of Communication with Friends and Family: More than three times a week    Frequency of Social Gatherings with Friends and Family: Twice a week    Attends Religious Services: 1 to 4 times per year    Active Member of [employer] or Organizations: No    Attends Banker Meetings:  Never    Marital Status: Widowed  Intimate Partner Violence: Not At Risk (06/14/2024)   Humiliation, Afraid, Rape, and Kick questionnaire    Fear of Current or Ex-Partner: No    Emotionally Abused: No    Physically Abused: No    Sexually Abused: No    FAMILY HISTORY: Family History  Problem Relation Age of Onset   Arthritis Mother    Arthritis Father    Lung cancer Father    Arthritis Maternal Grandmother    Kidney disease Maternal Grandmother    Depression Maternal Grandmother    Breast cancer Sister 74       three times   Colon cancer Sister 51    ALLERGIES:  is allergic to  keflex [cephalexin], penicillins, and sulfa antibiotics.  MEDICATIONS:  Current Outpatient Medications  Medication Sig Dispense Refill   acetaminophen  (TYLENOL ) 500 MG tablet Take 500 mg by mouth every 6 (six) hours as needed.     buPROPion  (WELLBUTRIN  XL) 300 MG 24 hr tablet Take 1 tablet by mouth daily.     ergocalciferol  (VITAMIN D2) 1.25 MG (50000 UT) capsule Take 50,000 Units by mouth once a week.     ferrous sulfate 325 (65 FE) MG tablet Take 325 mg by mouth daily with breakfast.     gabapentin  (NEURONTIN ) 300 MG capsule Take 300 mg by mouth at bedtime as needed (pain).     losartan (COZAAR) 25 MG tablet Take 25 mg by mouth daily.     topiramate  (TOPAMAX ) 50 MG tablet Take 1 tablet by mouth 2 (two) times daily.     No current facility-administered medications for this visit.     PHYSICAL EXAMINATION:   Vitals:   06/14/24 1102  BP: 124/88  Pulse: 65  Resp: 12  Temp: (!) 95.6 F (35.3 C)  SpO2: 100%   Filed Weights   06/14/24 1102  Weight: 205 lb 6.4 oz (93.2 kg)    Physical Exam Vitals and nursing note reviewed.  HENT:     Head: Normocephalic and atraumatic.     Mouth/Throat:     Pharynx: Oropharynx is clear.   Eyes:     Extraocular Movements: Extraocular movements intact.     Pupils: Pupils are equal, round, and reactive to light.    Cardiovascular:     Rate and Rhythm:  Normal rate and regular rhythm.  Pulmonary:     Comments: Decreased breath sounds bilaterally.  Abdominal:     Palpations: Abdomen is soft.   Musculoskeletal:        General: Normal range of motion.     Cervical back: Normal range of motion.   Skin:    General: Skin is warm.   Neurological:     General: No focal deficit present.     Mental Status: She is alert and oriented to person, place, and time.   Psychiatric:        Behavior: Behavior normal.        Judgment: Judgment normal.      LABORATORY DATA:  I have reviewed the data as listed Lab Results  Component Value Date   WBC 9.1 01/06/2024   HGB 9.1 (L) 01/06/2024   HCT 28.0 (L) 01/06/2024   MCV 88.6 01/06/2024   PLT 321 01/06/2024   Recent Labs    01/04/24 0604 01/05/24 0507 01/06/24 0433  NA 140 139 135  K 4.1 3.8 4.0  CL 111 110 106  CO2 22 21* 23  GLUCOSE 105* 92 83  BUN 27* 23 18  CREATININE 1.04* 0.89 0.91  CALCIUM 7.9* 8.1* 8.1*  GFRNONAA 59* >60 >60  PROT 5.3* 5.3* 5.7*  ALBUMIN 2.6* 2.5* 2.6*  AST 11* 10* 13*  ALT 7 6 8   ALKPHOS 52 53 83  BILITOT 0.5 0.7 0.9     No results found.  ASSESSMENT & PLAN:   Symptomatic anemia # Anemia- Hb-symptomatic.  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  Recommend barimelt+ iron  [over-the-counter medication/can buy online]. 2 a day- under the tongue.    Discussed regarding IV iron  infusion/Venofer . Discussed the potential acute infusion reactions with IV iron ; which are quite rare.  Patient understands the  risk; will proceed with infusions. No prior infusions.   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     # vit D def- on ergo weekly as nephrology; along with barimelt. Will repeat vit D levels.  # CKD- III-on advil [Dr.Korrapati]-  stable.   Thank you Dr. Lenon for allowing me to participate in the care of your pleasant patient. Please do not hesitate to contact me with questions or concerns in the interim.  # DISPOSITION: Friday preference # NO labs today # weekly venofer  x 4- if possible this friday # follow up 2 months - MD; labs- cbc/bmp;b12 ; vit D levels;  possible venofer - Dr.B    All questions were answered. The patient knows to call the clinic with any problems, questions or concerns.    Cindy JONELLE Joe, MD 06/14/2024 12:16 PM

## 2024-06-16 ENCOUNTER — Inpatient Hospital Stay

## 2024-06-16 VITALS — BP 109/73 | HR 65 | Temp 97.1°F | Resp 18

## 2024-06-16 DIAGNOSIS — D509 Iron deficiency anemia, unspecified: Secondary | ICD-10-CM | POA: Diagnosis not present

## 2024-06-16 DIAGNOSIS — D508 Other iron deficiency anemias: Secondary | ICD-10-CM

## 2024-06-16 MED ORDER — SODIUM CHLORIDE 0.9% FLUSH
10.0000 mL | Freq: Once | INTRAVENOUS | Status: AC | PRN
  Administered 2024-06-16: 10 mL
  Filled 2024-06-16: qty 10

## 2024-06-16 MED ORDER — IRON SUCROSE 20 MG/ML IV SOLN
200.0000 mg | Freq: Once | INTRAVENOUS | Status: AC
  Administered 2024-06-16: 200 mg via INTRAVENOUS
  Filled 2024-06-16: qty 10

## 2024-06-30 ENCOUNTER — Inpatient Hospital Stay: Attending: Internal Medicine

## 2024-06-30 VITALS — BP 149/82 | HR 90 | Temp 97.2°F | Resp 18

## 2024-06-30 DIAGNOSIS — D508 Other iron deficiency anemias: Secondary | ICD-10-CM

## 2024-06-30 DIAGNOSIS — D509 Iron deficiency anemia, unspecified: Secondary | ICD-10-CM | POA: Insufficient documentation

## 2024-06-30 MED ORDER — IRON SUCROSE 20 MG/ML IV SOLN
200.0000 mg | Freq: Once | INTRAVENOUS | Status: AC
  Administered 2024-06-30: 200 mg via INTRAVENOUS

## 2024-06-30 NOTE — Patient Instructions (Signed)

## 2024-07-07 ENCOUNTER — Inpatient Hospital Stay

## 2024-07-07 VITALS — BP 123/89 | HR 63 | Temp 95.0°F | Resp 18

## 2024-07-07 DIAGNOSIS — D508 Other iron deficiency anemias: Secondary | ICD-10-CM

## 2024-07-07 DIAGNOSIS — D509 Iron deficiency anemia, unspecified: Secondary | ICD-10-CM | POA: Diagnosis not present

## 2024-07-07 MED ORDER — SODIUM CHLORIDE 0.9% FLUSH
10.0000 mL | Freq: Once | INTRAVENOUS | Status: AC | PRN
  Administered 2024-07-07: 10 mL
  Filled 2024-07-07: qty 10

## 2024-07-07 MED ORDER — IRON SUCROSE 20 MG/ML IV SOLN
200.0000 mg | Freq: Once | INTRAVENOUS | Status: AC
  Administered 2024-07-07: 200 mg via INTRAVENOUS
  Filled 2024-07-07: qty 10

## 2024-07-14 ENCOUNTER — Inpatient Hospital Stay

## 2024-07-14 VITALS — BP 135/91 | HR 71 | Temp 95.0°F | Resp 18

## 2024-07-14 DIAGNOSIS — D509 Iron deficiency anemia, unspecified: Secondary | ICD-10-CM | POA: Diagnosis not present

## 2024-07-14 DIAGNOSIS — D508 Other iron deficiency anemias: Secondary | ICD-10-CM

## 2024-07-14 MED ORDER — SODIUM CHLORIDE 0.9% FLUSH
10.0000 mL | Freq: Once | INTRAVENOUS | Status: AC | PRN
  Administered 2024-07-14: 10 mL
  Filled 2024-07-14: qty 10

## 2024-07-14 MED ORDER — IRON SUCROSE 20 MG/ML IV SOLN
200.0000 mg | Freq: Once | INTRAVENOUS | Status: AC
  Administered 2024-07-14: 200 mg via INTRAVENOUS
  Filled 2024-07-14: qty 10

## 2024-08-23 ENCOUNTER — Encounter: Payer: Self-pay | Admitting: Ophthalmology

## 2024-08-28 NOTE — Discharge Instructions (Signed)

## 2024-08-30 ENCOUNTER — Ambulatory Visit: Payer: Self-pay | Admitting: Anesthesiology

## 2024-08-30 ENCOUNTER — Encounter
Admission: RE | Disposition: A | Discharge status: Home / Self Care | Payer: Self-pay | Source: Home / Self Care | Attending: Ophthalmology

## 2024-08-30 ENCOUNTER — Other Ambulatory Visit: Payer: Self-pay

## 2024-08-30 ENCOUNTER — Ambulatory Visit
Admission: RE | Admit: 2024-08-30 | Discharge: 2024-08-30 | Disposition: A | Discharge status: Home / Self Care | Attending: Ophthalmology | Admitting: Ophthalmology

## 2024-08-30 ENCOUNTER — Encounter: Payer: Self-pay | Admitting: Ophthalmology

## 2024-08-30 DIAGNOSIS — K219 Gastro-esophageal reflux disease without esophagitis: Secondary | ICD-10-CM | POA: Diagnosis not present

## 2024-08-30 DIAGNOSIS — D759 Disease of blood and blood-forming organs, unspecified: Secondary | ICD-10-CM | POA: Insufficient documentation

## 2024-08-30 DIAGNOSIS — F32A Depression, unspecified: Secondary | ICD-10-CM | POA: Diagnosis not present

## 2024-08-30 DIAGNOSIS — I10 Essential (primary) hypertension: Secondary | ICD-10-CM | POA: Diagnosis not present

## 2024-08-30 DIAGNOSIS — H2512 Age-related nuclear cataract, left eye: Secondary | ICD-10-CM | POA: Diagnosis present

## 2024-08-30 DIAGNOSIS — F411 Generalized anxiety disorder: Secondary | ICD-10-CM | POA: Diagnosis not present

## 2024-08-30 DIAGNOSIS — Z8711 Personal history of peptic ulcer disease: Secondary | ICD-10-CM | POA: Diagnosis not present

## 2024-08-30 DIAGNOSIS — D649 Anemia, unspecified: Secondary | ICD-10-CM | POA: Diagnosis not present

## 2024-08-30 DIAGNOSIS — H2511 Age-related nuclear cataract, right eye: Secondary | ICD-10-CM | POA: Insufficient documentation

## 2024-08-30 HISTORY — DX: Essential (primary) hypertension: I10

## 2024-08-30 HISTORY — DX: Anemia, unspecified: D64.9

## 2024-08-30 HISTORY — DX: Generalized anxiety disorder: F41.1

## 2024-08-30 HISTORY — DX: Obesity, unspecified: E66.9

## 2024-08-30 HISTORY — PX: CATARACT EXTRACTION W/PHACO: SHX586

## 2024-08-30 HISTORY — DX: Bariatric surgery status: Z98.84

## 2024-08-30 HISTORY — DX: Acute gastric ulcer with hemorrhage: K25.0

## 2024-08-30 SURGERY — PHACOEMULSIFICATION, CATARACT, WITH IOL INSERTION
Anesthesia: Monitor Anesthesia Care | Site: Eye | Laterality: Right

## 2024-08-30 MED ORDER — SIGHTPATH DOSE#1 NA HYALUR & NA CHOND-NA HYALUR IO KIT
PACK | INTRAOCULAR | Status: DC | PRN
  Administered 2024-08-30: 1 via OPHTHALMIC

## 2024-08-30 MED ORDER — FENTANYL CITRATE (PF) 100 MCG/2ML IJ SOLN
INTRAMUSCULAR | Status: DC | PRN
  Administered 2024-08-30: 50 ug via INTRAVENOUS

## 2024-08-30 MED ORDER — LACTATED RINGERS IV SOLN
INTRAVENOUS | Status: DC
Start: 2024-08-30 — End: 2024-08-30

## 2024-08-30 MED ORDER — BRIMONIDINE TARTRATE-TIMOLOL 0.2-0.5 % OP SOLN
OPHTHALMIC | Status: DC | PRN
  Administered 2024-08-30: 1 [drp] via OPHTHALMIC

## 2024-08-30 MED ORDER — ARMC OPHTHALMIC DILATING DROPS
OPHTHALMIC | Status: AC
  Filled 2024-08-30: qty 0.5

## 2024-08-30 MED ORDER — SIGHTPATH DOSE#1 BSS IO SOLN
INTRAOCULAR | Status: DC | PRN
  Administered 2024-08-30: 75 mL via OPHTHALMIC

## 2024-08-30 MED ORDER — SIGHTPATH DOSE#1 BSS IO SOLN
INTRAOCULAR | Status: DC | PRN
  Administered 2024-08-30: 15 mL via INTRAOCULAR

## 2024-08-30 MED ORDER — LIDOCAINE HCL (PF) 2 % IJ SOLN
INTRAOCULAR | Status: DC | PRN
  Administered 2024-08-30: 2 mL

## 2024-08-30 MED ORDER — TETRACAINE HCL 0.5 % OP SOLN
1.0000 [drp] | OPHTHALMIC | Status: DC | PRN
  Administered 2024-08-30 (×3): 1 [drp] via OPHTHALMIC

## 2024-08-30 MED ORDER — ARMC OPHTHALMIC DILATING DROPS
1.0000 | OPHTHALMIC | Status: DC | PRN
  Administered 2024-08-30 (×3): 1 via OPHTHALMIC

## 2024-08-30 MED ORDER — FENTANYL CITRATE (PF) 100 MCG/2ML IJ SOLN
INTRAMUSCULAR | Status: AC
  Filled 2024-08-30: qty 2

## 2024-08-30 MED ORDER — TETRACAINE HCL 0.5 % OP SOLN
OPHTHALMIC | Status: AC
  Filled 2024-08-30: qty 4

## 2024-08-30 MED ORDER — MIDAZOLAM HCL 2 MG/2ML IJ SOLN
INTRAMUSCULAR | Status: AC
  Filled 2024-08-30: qty 2

## 2024-08-30 MED ORDER — MIDAZOLAM HCL 2 MG/2ML IJ SOLN
INTRAMUSCULAR | Status: DC | PRN
  Administered 2024-08-30: 2 mg via INTRAVENOUS

## 2024-08-30 MED ORDER — MOXIFLOXACIN HCL 0.5 % OP SOLN
OPHTHALMIC | Status: DC | PRN
  Administered 2024-08-30: .2 mL via OPHTHALMIC

## 2024-08-30 SURGICAL SUPPLY — 8 items
FEE CATARACT SUITE SIGHTPATH (MISCELLANEOUS) ×2 IMPLANT
GLOVE BIOGEL PI IND STRL 8 (GLOVE) ×2 IMPLANT
GLOVE SURG LX STRL 7.5 STRW (GLOVE) ×2 IMPLANT
GLOVE SURG SYN 6.5 PF PI BL (GLOVE) ×2 IMPLANT
LENS IOL TECNIS EYHANCE 19.0 (Intraocular Lens) IMPLANT
NDL FILTER BLUNT 18X1 1/2 (NEEDLE) ×2 IMPLANT
NEEDLE FILTER BLUNT 18X1 1/2 (NEEDLE) ×1 IMPLANT
SYR 3ML LL SCALE MARK (SYRINGE) ×2 IMPLANT

## 2024-08-30 NOTE — Op Note (Signed)
 LOCATION:  [facility]   PREOPERATIVE DIAGNOSIS:    Nuclear sclerotic cataract right eye. H25.11   POSTOPERATIVE DIAGNOSIS:  Nuclear sclerotic cataract right eye.     PROCEDURE:  Phacoemusification with posterior chamber intraocular lens placement of the right eye   ULTRASOUND TIME: Procedure(s): PHACOEMULSIFICATION, CATARACT, WITH IOL INSERTION 2.85 00:29.9 (Right)  LENS:   Implant Name Type Inv. Item Serial No. Manufacturer Lot No. LRB No. Used Action  LENS IOL TECNIS EYHANCE 19.0 - [id number] Intraocular Lens LENS IOL TECNIS EYHANCE 19.0 [id number] [employer]  Right 1 Implanted         SURGEON:  Dene FABIENE Etienne, MD   ANESTHESIA:  Topical with tetracaine  drops and 2% Xylocaine  jelly, augmented with 1% preservative-free intracameral lidocaine .    COMPLICATIONS:  None.   DESCRIPTION OF PROCEDURE:  The patient was identified in the holding room and transported to the operating room and placed in the supine position under the operating microscope.  The right eye was identified as the operative eye and it was prepped and draped in the usual sterile ophthalmic fashion.   A 1 millimeter clear-corneal paracentesis was made at the 12:00 position.  0.5 ml of preservative-free 1% lidocaine  was injected into the anterior chamber. The anterior chamber was filled with Viscoat viscoelastic.  A 2.4 millimeter keratome was used to make a near-clear corneal incision at the 9:00 position.  A curvilinear capsulorrhexis was made with a cystotome and capsulorrhexis forceps.  Balanced salt solution was used to hydrodissect and hydrodelineate the nucleus.   Phacoemulsification was then used in stop and chop fashion to remove the lens nucleus and epinucleus.  The remaining cortex was then removed using the irrigation and aspiration handpiece. Provisc was then placed into the capsular bag to distend it for lens placement.  A lens was then injected into the capsular bag.  The remaining  viscoelastic was aspirated.   Wounds were hydrated with balanced salt solution.  The anterior chamber was inflated to a physiologic pressure with balanced salt solution.  No wound leaks were noted. Vigamox  0.2 ml of a 1mg  per ml solution was injected into the anterior chamber for a dose of 0.2 mg of intracameral antibiotic at the completion of the case.   Timolol  and Brimonidine  drops were applied to the eye.  The patient was taken to the recovery room in stable condition without complications of anesthesia or surgery.   [name] 08/30/2024, 10:06 AM

## 2024-08-30 NOTE — Anesthesia Postprocedure Evaluation (Signed)
 Anesthesia Post Note  Patient: [name]  Procedure(s) Performed: PHACOEMULSIFICATION, CATARACT, WITH IOL INSERTION 2.85 00:29.9 (Right: Eye)  Patient location during evaluation: PACU Anesthesia Type: MAC Level of consciousness: awake and alert Pain management: pain level controlled Vital Signs Assessment: post-procedure vital signs reviewed and stable Respiratory status: spontaneous breathing, nonlabored ventilation, respiratory function stable and patient connected to nasal cannula oxygen Cardiovascular status: stable and blood pressure returned to baseline Postop Assessment: no apparent nausea or vomiting Anesthetic complications: no   No notable events documented.   Last Vitals:  Vitals:   08/30/24 1008 08/30/24 1013  BP: (!) 110/99 126/83  Pulse: 73 71  Resp: 12 13  Temp: (!) 36.3 C (!) 36.3 C  SpO2: 100% 100%    Last Pain:  Vitals:   08/30/24 1013  TempSrc:   PainSc: 0-No pain                 Keyara Ent C Byan Poplaski

## 2024-08-30 NOTE — Anesthesia Preprocedure Evaluation (Addendum)
 Anesthesia Evaluation  Patient identified by MRN, date of birth, ID band Patient awake    Reviewed: Allergy & Precautions, H&P , NPO status , Patient's Chart, lab work & pertinent test results  Airway Mallampati: II   Neck ROM: Full    Dental no notable dental hx. (+) Caps Has some caps but none in front :   Pulmonary neg pulmonary ROS   Pulmonary exam normal breath sounds clear to auscultation       Cardiovascular hypertension, Normal cardiovascular exam Rhythm:Regular Rate:Normal     Neuro/Psych  PSYCHIATRIC DISORDERS Anxiety Depression    negative neurological ROS  negative psych ROS   GI/Hepatic negative GI ROS, Neg liver ROS, PUD,GERD  ,,  Endo/Other  negative endocrine ROS    Renal/GU Renal diseasenegative Renal ROS  negative genitourinary   Musculoskeletal negative musculoskeletal ROS (+) Arthritis ,    Abdominal   Peds negative pediatric ROS (+)  Hematology negative hematology ROS (+) Blood dyscrasia, anemia   Anesthesia Other Findings IDA (iron  deficiency anemia)  History of hemorrhoids Depression HTN Arthritis  Kidney stones Morbid obesity Heartburn History of blood transfusion  History of kidney stones S/P gastric bypass  Acute gastric ulcer with hemorrhage Generalized anxiety disorder  Obesity (BMI 30-39.9) Anemia  Bariatric surgery status HTN   Reproductive/Obstetrics negative OB ROS                              Anesthesia Physical Anesthesia Plan  ASA: 2  Anesthesia Plan: MAC   Post-op Pain Management:    Induction: Intravenous  PONV Risk Score and Plan:   Airway Management Planned: Natural Airway and Nasal Cannula  Additional Equipment:   Intra-op Plan:   Post-operative Plan:   Informed Consent: I have reviewed the patients History and Physical, chart, labs and discussed the procedure including the risks, benefits and alternatives for the  proposed anesthesia with the patient or authorized representative who has indicated his/her understanding and acceptance.     Dental Advisory Given  Plan Discussed with: Anesthesiologist, CRNA and Surgeon  Anesthesia Plan Comments: (Patient consented for risks of anesthesia including but not limited to:  - adverse reactions to medications - damage to eyes, teeth, lips or other oral mucosa - nerve damage due to positioning  - sore throat or hoarseness - Damage to heart, brain, nerves, lungs, other parts of body or loss of life  Patient voiced understanding and assent.)         Anesthesia Quick Evaluation

## 2024-08-30 NOTE — H&P (Signed)
 [facility]   Primary Care Physician:  Lenon Layman ORN, MD Ophthalmologist: Dr. Dene Etienne  Pre-Procedure History & Physical: HPI:  ASIANAE MINKLER is a 68 y.o. female here for ophthalmic surgery.   Past Medical History:  Diagnosis Date   Acute gastric ulcer with hemorrhage    Anemia    Arthritis    Bariatric surgery status    Chicken pox    Depression    Generalized anxiety disorder    Heartburn    History of blood transfusion    History of hemorrhoids    History of kidney stones    Hypertension    IDA (iron  deficiency anemia)    Kidney stones    Morbid obesity (HCC)    s/p Gastric bypass   Obesity (BMI 30-39.9)    S/P gastric bypass     Past Surgical History:  Procedure Laterality Date   ABDOMINAL HYSTERECTOMY  1997   CERVICAL SPINE SURGERY  2009   Fusion   CESAREAN SECTION  1987   CHOLECYSTECTOMY     COLONOSCOPY  04/22/2007   COLONOSCOPY WITH PROPOFOL  N/A 11/30/2017   Procedure: COLONOSCOPY WITH PROPOFOL ;  Surgeon: Dessa Reyes ORN, MD;  Location: [facility];  Service: Endoscopy;  Laterality: N/A;   ESOPHAGOGASTRODUODENOSCOPY (EGD) WITH PROPOFOL  N/A 11/30/2017   Procedure: ESOPHAGOGASTRODUODENOSCOPY (EGD) WITH PROPOFOL ;  Surgeon: Dessa Reyes ORN, MD;  Location: [facility];  Service: Endoscopy;  Laterality: N/A;   ESOPHAGOGASTRODUODENOSCOPY (EGD) WITH PROPOFOL  N/A 01/06/2024   Procedure: ESOPHAGOGASTRODUODENOSCOPY (EGD) WITH PROPOFOL ;  Surgeon: Jinny Carmine, MD;  Location: [facility];  Service: Endoscopy;  Laterality: N/A;   GASTRIC BYPASS  2007   HERNIA REPAIR  2011   KNEE ARTHROSCOPY WITH MEDIAL MENISECTOMY Right 01/15/2022   Procedure: Right knee partial medial and lateral meniscectomies and loose body removal;  Surgeon: Kathlynn Sharper, MD;  Location: [facility];  Service: Orthopedics;  Laterality: Right;   OOPHORECTOMY     TONSILLECTOMY AND ADENOIDECTOMY     vulvar cysts     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     Prior to Admission medications   Medication Sig Start Date End Date Taking? Authorizing Provider  acetaminophen  (TYLENOL ) 500 MG tablet Take 500 mg by mouth every 6 (six) hours as needed.   Yes [provider]  buPROPion  (WELLBUTRIN  XL) 300 MG 24 hr tablet Take 1 tablet by mouth daily. 02/24/24  Yes [provider]  ergocalciferol  (VITAMIN D2) 1.25 MG (50000 UT) capsule Take 50,000 Units by mouth once a week. 10/14/23 10/13/24 Yes [provider]  ferrous sulfate 325 (65 FE) MG tablet Take 325 mg by mouth daily with breakfast.   Yes [provider]  gabapentin  (NEURONTIN ) 300 MG capsule Take 300 mg by mouth at bedtime as needed (pain). 12/10/21  Yes [provider]  losartan (COZAAR) 25 MG tablet Take 25 mg by mouth daily. 01/11/24  Yes [provider]  topiramate  (TOPAMAX ) 50 MG tablet Take 1 tablet by mouth 2 (two) times daily. 02/24/24  Yes [provider]    Allergies as of 08/15/2024 - Review Complete 06/30/2024  Allergen Reaction Noted   Keflex [cephalexin] Swelling 12/29/2021   Penicillins Swelling 05/27/2015   Sulfa antibiotics Swelling 12/29/2021    Family History  Problem Relation Age of Onset   Arthritis Mother    Arthritis Father    Lung cancer Father    Arthritis Maternal Grandmother    Kidney disease Maternal Grandmother    Depression Maternal Grandmother  Breast cancer Sister 68       three times   Colon cancer Sister 28    Social History   Socioeconomic History   Marital status: Widowed    Spouse name: Not on file   Number of children: Not on file   Years of education: Not on file   Highest education level: Not on file  Occupational History   Not on file  Tobacco Use   Smoking status: Never   Smokeless tobacco: Never  Vaping Use   Vaping status: Never Used  Substance and Sexual Activity   Alcohol use: Yes    Alcohol/week: 0.0 - 1.0 standard drinks of alcohol   Drug use: No   Sexual  activity: Not Currently  Other Topics Concern   Not on file  Social History Narrative   Not on file   Social Drivers of Health   Financial Resource Strain: Low Risk  (06/30/2024)   Received from [facility]   Overall Financial Resource Strain (CARDIA)    Difficulty of Paying Living Expenses: Not hard at all  Food Insecurity: No Food Insecurity (06/30/2024)   Received from [facility]   Hunger Vital Sign    Within the past 12 months, you worried that your food would run out before you got the money to buy more.: Never true    Within the past 12 months, the food you bought just didn't last and you didn't have money to get more.: Never true  Transportation Needs: No Transportation Needs (06/30/2024)   Received from [facility] - Transportation    In the past 12 months, has lack of transportation kept you from medical appointments or from getting medications?: No    Lack of Transportation (Non-Medical): No  Physical Activity: Not on file  Stress: Not on file  Social Connections: Moderately Isolated (01/03/2024)   Social Connection and Isolation Panel    Frequency of Communication with Friends and Family: More than three times a week    Frequency of Social Gatherings with Friends and Family: Twice a week    Attends Religious Services: 1 to 4 times per year    Active Member of [employer] or Organizations: No    Attends Banker Meetings: Never    Marital Status: Widowed  Intimate Partner Violence: Not At Risk (06/14/2024)   Humiliation, Afraid, Rape, and Kick questionnaire    Fear of Current or Ex-Partner: No    Emotionally Abused: No    Physically Abused: No    Sexually Abused: No    Review of Systems: See HPI, otherwise negative ROS  Physical Exam: BP (!) 142/81   Pulse 70   Temp (!) 97.2 F (36.2 C) (Temporal)   Resp (!) 23   Ht 5' 3 (1.6 m)   Wt 94.9 kg   SpO2 100%   BMI 37.06 kg/m  General:    Alert,  pleasant and cooperative in NAD Head:  Normocephalic and atraumatic. Lungs:  Clear to auscultation.    Heart:  Regular rate and rhythm.   Impression/Plan: Montie LITTIE Hurst is here for ophthalmic surgery.  Risks, benefits, limitations, and alternatives regarding ophthalmic surgery have been reviewed with the patient.  Questions have been answered.  All parties agreeable.   MITTIE GASKIN, MD  08/30/2024, 9:21 AM

## 2024-08-30 NOTE — Transfer of Care (Signed)
 Immediate Anesthesia Transfer of Care Note  Patient: [name]  Procedure(s) Performed: PHACOEMULSIFICATION, CATARACT, WITH IOL INSERTION 2.85 00:29.9 (Right: Eye)  Patient Location: PACU  Anesthesia Type: MAC  Level of Consciousness: awake, alert  and patient cooperative  Airway and Oxygen Therapy: Patient Spontanous Breathing and Patient connected to supplemental oxygen  Post-op Assessment: Post-op Vital signs reviewed, Patient's Cardiovascular Status Stable, Respiratory Function Stable, Patent Airway and No signs of Nausea or vomiting  Post-op Vital Signs: Reviewed and stable  Complications: No notable events documented.

## 2024-09-06 NOTE — Anesthesia Preprocedure Evaluation (Addendum)
 Anesthesia Evaluation  Patient identified by MRN, date of birth, ID band Patient awake    Reviewed: Allergy & Precautions, H&P , NPO status , Patient's Chart, lab work & pertinent test results  Airway Mallampati: II  TM Distance: >3 FB Neck ROM: Full    Dental no notable dental hx.    Pulmonary neg pulmonary ROS   Pulmonary exam normal breath sounds clear to auscultation       Cardiovascular hypertension, negative cardio ROS Normal cardiovascular exam Rhythm:Regular Rate:Normal     Neuro/Psych  PSYCHIATRIC DISORDERS Anxiety Depression    negative neurological ROS  negative psych ROS   GI/Hepatic negative GI ROS, Neg liver ROS, PUD,GERD  ,,  Endo/Other  negative endocrine ROS    Renal/GU Renal diseasenegative Renal ROS  negative genitourinary   Musculoskeletal negative musculoskeletal ROS (+) Arthritis ,    Abdominal   Peds negative pediatric ROS (+)  Hematology negative hematology ROS (+) Blood dyscrasia, anemia   Anesthesia Other Findings Previous cataract surgery 08-30-24 Dr. Ola   IDA (iron  deficiency anemia)  History of hemorrhoids Chicken pox  Depression Arthritis  Kidney stones Morbid obesity (HCC)  Heartburn History of blood transfusion  History of kidney stones S/P gastric bypass  Acute gastric ulcer with hemorrhage Generalized anxiety disorder Obesity (BMI 30-39.9) Anemia  Bariatric surgery status Hypertension     Reproductive/Obstetrics negative OB ROS                              Anesthesia Physical Anesthesia Plan  ASA: 2  Anesthesia Plan: MAC   Post-op Pain Management:    Induction: Intravenous  PONV Risk Score and Plan:   Airway Management Planned: Natural Airway and Nasal Cannula  Additional Equipment:   Intra-op Plan:   Post-operative Plan:   Informed Consent: I have reviewed the patients History and Physical, chart, labs and discussed  the procedure including the risks, benefits and alternatives for the proposed anesthesia with the patient or authorized representative who has indicated his/her understanding and acceptance.     Dental Advisory Given  Plan Discussed with: Anesthesiologist, CRNA and Surgeon  Anesthesia Plan Comments: (Patient consented for risks of anesthesia including but not limited to:  - adverse reactions to medications - damage to eyes, teeth, lips or other oral mucosa - nerve damage due to positioning  - sore throat or hoarseness - Damage to heart, brain, nerves, lungs, other parts of body or loss of life  Patient voiced understanding and assent.)         Anesthesia Quick Evaluation

## 2024-09-07 ENCOUNTER — Telehealth: Payer: Self-pay | Admitting: Internal Medicine

## 2024-09-07 ENCOUNTER — Encounter: Payer: Self-pay | Admitting: Internal Medicine

## 2024-09-07 NOTE — Telephone Encounter (Signed)
 Pt called and requested appt change from 10/3 to a later date due to cataract surgery on 10/1 - changed appts w/pt based on MD schedule since he will be out of town - sent appt reminder in mail per pt request - [facility]

## 2024-09-07 NOTE — Telephone Encounter (Signed)
 Pt called this morning and left a vm wanting to rs her appts. I called the pt back, no answer so I left a vm for her to rerturn my call so we can get her appts rs.

## 2024-09-18 NOTE — Discharge Instructions (Signed)

## 2024-09-20 ENCOUNTER — Ambulatory Visit: Payer: Self-pay | Admitting: Anesthesiology

## 2024-09-20 ENCOUNTER — Encounter: Payer: Self-pay | Admitting: Ophthalmology

## 2024-09-20 ENCOUNTER — Encounter
Admission: RE | Disposition: A | Discharge status: Home / Self Care | Payer: Self-pay | Source: Home / Self Care | Attending: Ophthalmology

## 2024-09-20 ENCOUNTER — Ambulatory Visit
Admission: RE | Admit: 2024-09-20 | Discharge: 2024-09-20 | Disposition: A | Discharge status: Home / Self Care | Attending: Ophthalmology | Admitting: Ophthalmology

## 2024-09-20 ENCOUNTER — Other Ambulatory Visit: Payer: Self-pay

## 2024-09-20 DIAGNOSIS — Z8711 Personal history of peptic ulcer disease: Secondary | ICD-10-CM | POA: Insufficient documentation

## 2024-09-20 DIAGNOSIS — D649 Anemia, unspecified: Secondary | ICD-10-CM | POA: Insufficient documentation

## 2024-09-20 DIAGNOSIS — F419 Anxiety disorder, unspecified: Secondary | ICD-10-CM | POA: Diagnosis not present

## 2024-09-20 DIAGNOSIS — M1990 Unspecified osteoarthritis, unspecified site: Secondary | ICD-10-CM | POA: Diagnosis not present

## 2024-09-20 DIAGNOSIS — H2512 Age-related nuclear cataract, left eye: Secondary | ICD-10-CM | POA: Insufficient documentation

## 2024-09-20 DIAGNOSIS — F32A Depression, unspecified: Secondary | ICD-10-CM | POA: Diagnosis not present

## 2024-09-20 DIAGNOSIS — I10 Essential (primary) hypertension: Secondary | ICD-10-CM | POA: Diagnosis not present

## 2024-09-20 DIAGNOSIS — H2511 Age-related nuclear cataract, right eye: Secondary | ICD-10-CM | POA: Diagnosis present

## 2024-09-20 HISTORY — PX: CATARACT EXTRACTION W/PHACO: SHX586

## 2024-09-20 SURGERY — PHACOEMULSIFICATION, CATARACT, WITH IOL INSERTION
Anesthesia: Monitor Anesthesia Care | Site: Eye | Laterality: Left

## 2024-09-20 MED ORDER — SIGHTPATH DOSE#1 BSS IO SOLN
INTRAOCULAR | Status: DC | PRN
  Administered 2024-09-20: 41 mL via OPHTHALMIC

## 2024-09-20 MED ORDER — SIGHTPATH DOSE#1 BSS IO SOLN
INTRAOCULAR | Status: DC | PRN
  Administered 2024-09-20: 15 mL via INTRAOCULAR

## 2024-09-20 MED ORDER — ARMC OPHTHALMIC DILATING DROPS
OPHTHALMIC | Status: AC
  Filled 2024-09-20: qty 0.5

## 2024-09-20 MED ORDER — ARMC OPHTHALMIC DILATING DROPS
1.0000 | OPHTHALMIC | Status: DC | PRN
  Administered 2024-09-20 (×3): 1 via OPHTHALMIC

## 2024-09-20 MED ORDER — ACETAMINOPHEN 325 MG PO TABS
ORAL_TABLET | ORAL | Status: AC
  Filled 2024-09-20: qty 2

## 2024-09-20 MED ORDER — LIDOCAINE HCL (PF) 2 % IJ SOLN
INTRAOCULAR | Status: DC | PRN
  Administered 2024-09-20: 2 mL

## 2024-09-20 MED ORDER — LACTATED RINGERS IV SOLN: INTRAVENOUS | Status: DC

## 2024-09-20 MED ORDER — SIGHTPATH DOSE#1 NA HYALUR & NA CHOND-NA HYALUR IO KIT
PACK | INTRAOCULAR | Status: DC | PRN
  Administered 2024-09-20: 1 via OPHTHALMIC

## 2024-09-20 MED ORDER — FENTANYL CITRATE (PF) 100 MCG/2ML IJ SOLN
INTRAMUSCULAR | Status: AC
  Filled 2024-09-20: qty 2

## 2024-09-20 MED ORDER — TETRACAINE HCL 0.5 % OP SOLN
OPHTHALMIC | Status: AC
  Filled 2024-09-20: qty 4

## 2024-09-20 MED ORDER — TETRACAINE HCL 0.5 % OP SOLN
1.0000 [drp] | OPHTHALMIC | Status: DC | PRN
  Administered 2024-09-20 (×3): 1 [drp] via OPHTHALMIC

## 2024-09-20 MED ORDER — MIDAZOLAM HCL 2 MG/2ML IJ SOLN
INTRAMUSCULAR | Status: AC
  Filled 2024-09-20: qty 2

## 2024-09-20 MED ORDER — FENTANYL CITRATE (PF) 100 MCG/2ML IJ SOLN
INTRAMUSCULAR | Status: DC | PRN
  Administered 2024-09-20: 50 ug via INTRAVENOUS

## 2024-09-20 MED ORDER — MOXIFLOXACIN HCL 0.5 % OP SOLN
OPHTHALMIC | Status: DC | PRN
  Administered 2024-09-20: .2 mL via OPHTHALMIC

## 2024-09-20 MED ORDER — BRIMONIDINE TARTRATE-TIMOLOL 0.2-0.5 % OP SOLN
OPHTHALMIC | Status: DC | PRN
  Administered 2024-09-20: 1 [drp] via OPHTHALMIC

## 2024-09-20 MED ORDER — MIDAZOLAM HCL 2 MG/2ML IJ SOLN
INTRAMUSCULAR | Status: DC | PRN
  Administered 2024-09-20: 2 mg via INTRAVENOUS

## 2024-09-20 MED ORDER — ACETAMINOPHEN 325 MG PO TABS
650.0000 mg | ORAL_TABLET | Freq: Four times a day (QID) | ORAL | Status: DC | PRN
  Administered 2024-09-20: 650 mg via ORAL

## 2024-09-20 MED ORDER — ACETAMINOPHEN 325 MG PO TABS
ORAL_TABLET | ORAL | Status: AC
  Filled 2024-09-20: qty 1

## 2024-09-20 SURGICAL SUPPLY — 8 items
FEE CATARACT SUITE SIGHTPATH (MISCELLANEOUS) ×2 IMPLANT
GLOVE BIOGEL PI IND STRL 8 (GLOVE) ×2 IMPLANT
GLOVE SURG LX STRL 7.5 STRW (GLOVE) ×2 IMPLANT
GLOVE SURG SYN 6.5 PF PI BL (GLOVE) ×2 IMPLANT
LENS IOL TECNIS EYHANCE 18.0 (Intraocular Lens) IMPLANT
NDL FILTER BLUNT 18X1 1/2 (NEEDLE) ×2 IMPLANT
NEEDLE FILTER BLUNT 18X1 1/2 (NEEDLE) ×1 IMPLANT
SYR 3ML LL SCALE MARK (SYRINGE) ×2 IMPLANT

## 2024-09-20 NOTE — H&P (Signed)
 [facility]   Primary Care Physician:  Lenon Layman ORN, MD Ophthalmologist: Dr. Dene Etienne  Pre-Procedure History & Physical: HPI:  [name] is a 68 y.o. female here for ophthalmic surgery.   Past Medical History:  Diagnosis Date   Acute gastric ulcer with hemorrhage    Anemia    Arthritis    Bariatric surgery status    Chicken pox    Depression    Generalized anxiety disorder    Heartburn    History of blood transfusion    History of hemorrhoids    History of kidney stones    Hypertension    IDA (iron  deficiency anemia)    Kidney stones    Morbid obesity (HCC)    s/p Gastric bypass   Obesity (BMI 30-39.9)    S/P gastric bypass     Past Surgical History:  Procedure Laterality Date   ABDOMINAL HYSTERECTOMY  1997   CATARACT EXTRACTION W/PHACO Right 08/30/2024   Procedure: PHACOEMULSIFICATION, CATARACT, WITH IOL INSERTION 2.85 00:29.9;  Surgeon: Etienne Dene, MD;  Location: [facility];  Service: Ophthalmology;  Laterality: Right;   CERVICAL SPINE SURGERY  2009   Fusion   CESAREAN SECTION  1987   CHOLECYSTECTOMY     COLONOSCOPY  04/22/2007   COLONOSCOPY WITH PROPOFOL  N/A 11/30/2017   Procedure: COLONOSCOPY WITH PROPOFOL ;  Surgeon: Dessa Reyes ORN, MD;  Location: [facility];  Service: Endoscopy;  Laterality: N/A;   ESOPHAGOGASTRODUODENOSCOPY (EGD) WITH PROPOFOL  N/A 11/30/2017   Procedure: ESOPHAGOGASTRODUODENOSCOPY (EGD) WITH PROPOFOL ;  Surgeon: Dessa Reyes ORN, MD;  Location: [facility];  Service: Endoscopy;  Laterality: N/A;   ESOPHAGOGASTRODUODENOSCOPY (EGD) WITH PROPOFOL  N/A 01/06/2024   Procedure: ESOPHAGOGASTRODUODENOSCOPY (EGD) WITH PROPOFOL ;  Surgeon: Jinny Carmine, MD;  Location: [facility];  Service: Endoscopy;  Laterality: N/A;   GASTRIC BYPASS  2007   HERNIA REPAIR  2011   KNEE ARTHROSCOPY WITH MEDIAL MENISECTOMY Right 01/15/2022   Procedure: Right knee partial medial and lateral meniscectomies and  loose body removal;  Surgeon: Kathlynn Sharper, MD;  Location: [facility];  Service: Orthopedics;  Laterality: Right;   OOPHORECTOMY     TONSILLECTOMY AND ADENOIDECTOMY     vulvar cysts     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    Prior to Admission medications   Medication Sig Start Date End Date Taking? Authorizing Provider  acetaminophen  (TYLENOL ) 500 MG tablet Take 500 mg by mouth every 6 (six) hours as needed.   Yes [provider]  buPROPion  (WELLBUTRIN  XL) 300 MG 24 hr tablet Take 1 tablet by mouth daily. 02/24/24  Yes [provider]  ergocalciferol  (VITAMIN D2) 1.25 MG (50000 UT) capsule Take 50,000 Units by mouth once a week. 10/14/23 10/13/24 Yes [provider]  ferrous sulfate 325 (65 FE) MG tablet Take 325 mg by mouth daily with breakfast.   Yes [provider]  gabapentin  (NEURONTIN ) 300 MG capsule Take 300 mg by mouth at bedtime as needed (pain). 12/10/21  Yes [provider]  losartan (COZAAR) 25 MG tablet Take 25 mg by mouth daily. 01/11/24  Yes [provider]  topiramate  (TOPAMAX ) 50 MG tablet Take 1 tablet by mouth 2 (two) times daily. 02/24/24  Yes [provider]    Allergies as of 08/15/2024 - Review Complete 06/30/2024  Allergen Reaction Noted   Keflex [cephalexin] Swelling 12/29/2021   Penicillins Swelling 05/27/2015   Sulfa antibiotics Swelling 12/29/2021    Family History  Problem Relation Age of Onset   Arthritis  Mother    Arthritis Father    Lung cancer Father    Arthritis Maternal Grandmother    Kidney disease Maternal Grandmother    Depression Maternal Grandmother    Breast cancer Sister 89       three times   Colon cancer Sister 50    Social History   Socioeconomic History   Marital status: Widowed    Spouse name: Not on file   Number of children: Not on file   Years of education: Not on file   Highest education level: Not on file  Occupational History   Not on file   Tobacco Use   Smoking status: Never   Smokeless tobacco: Never  Vaping Use   Vaping status: Never Used  Substance and Sexual Activity   Alcohol use: Yes    Alcohol/week: 0.0 - 1.0 standard drinks of alcohol   Drug use: No   Sexual activity: Not Currently  Other Topics Concern   Not on file  Social History Narrative   Not on file   Social Drivers of Health   Financial Resource Strain: Low Risk  (06/30/2024)   Received from [facility]   Overall Financial Resource Strain (CARDIA)    Difficulty of Paying Living Expenses: Not hard at all  Food Insecurity: No Food Insecurity (06/30/2024)   Received from [facility]   Hunger Vital Sign    Within the past 12 months, you worried that your food would run out before you got the money to buy more.: Never true    Within the past 12 months, the food you bought just didn't last and you didn't have money to get more.: Never true  Transportation Needs: No Transportation Needs (06/30/2024)   Received from [facility] - Transportation    In the past 12 months, has lack of transportation kept you from medical appointments or from getting medications?: No    Lack of Transportation (Non-Medical): No  Physical Activity: Not on file  Stress: Not on file  Social Connections: Moderately Isolated (01/03/2024)   Social Connection and Isolation Panel    Frequency of Communication with Friends and Family: More than three times a week    Frequency of Social Gatherings with Friends and Family: Twice a week    Attends Religious Services: 1 to 4 times per year    Active Member of [employer] or Organizations: No    Attends Banker Meetings: Never    Marital Status: Widowed  Intimate Partner Violence: Not At Risk (06/14/2024)   Humiliation, Afraid, Rape, and Kick questionnaire    Fear of Current or Ex-Partner: No    Emotionally Abused: No    Physically Abused: No    Sexually Abused: No     Review of Systems: See HPI, otherwise negative ROS  Physical Exam: There were no vitals taken for this visit. General:   Alert,  pleasant and cooperative in NAD Head:  Normocephalic and atraumatic. Lungs:  Clear to auscultation.    Heart:  Regular rate and rhythm.   Impression/Plan: [name] is here for ophthalmic surgery.  Risks, benefits, limitations, and alternatives regarding ophthalmic surgery have been reviewed with the patient.  Questions have been answered.  All parties agreeable.   MITTIE GASKIN, MD  09/20/2024, 7:59 AM

## 2024-09-20 NOTE — Anesthesia Postprocedure Evaluation (Signed)
 Anesthesia Post Note  Patient: RHAYNE CHATWIN  Procedure(s) Performed: PHACOEMULSIFICATION, CATARACT, WITH IOL INSERTION 3.26 00:24.8 (Left: Eye)  Patient location during evaluation: PACU Anesthesia Type: MAC Level of consciousness: awake and alert Pain management: pain level controlled Vital Signs Assessment: post-procedure vital signs reviewed and stable Respiratory status: spontaneous breathing, nonlabored ventilation, respiratory function stable and patient connected to nasal cannula oxygen Cardiovascular status: stable and blood pressure returned to baseline Postop Assessment: no apparent nausea or vomiting Anesthetic complications: no   No notable events documented.   Last Vitals:  Vitals:   09/20/24 0910 09/20/24 0914  BP: 129/89 117/75  Pulse: 70 70  Resp: 15 15  Temp:  (!) 36.2 C  SpO2: 99% 100%    Last Pain:  Vitals:   09/20/24 0914  TempSrc:   PainSc: 5                  Shareen Capwell C Media Pizzini

## 2024-09-20 NOTE — Op Note (Signed)
 OPERATIVE NOTE  [name] [id number] 09/20/2024   PREOPERATIVE DIAGNOSIS:  Nuclear sclerotic cataract left eye. H25.12   POSTOPERATIVE DIAGNOSIS:    Nuclear sclerotic cataract left eye.     PROCEDURE:  Phacoemusification with posterior chamber intraocular lens placement of the left eye  Ultrasound time: Procedure(s): PHACOEMULSIFICATION, CATARACT, WITH IOL INSERTION 3.26 00:24.8 (Left)  LENS:   Implant Name Type Inv. Item Serial No. Manufacturer Lot No. LRB No. Used Action  LENS IOL TECNIS EYHANCE 18.0 - [id number] Intraocular Lens LENS IOL TECNIS EYHANCE 18.0 [id number] [employer]  Left 1 Implanted      SURGEON:  Dene FABIENE Etienne, MD   ANESTHESIA:  Topical with tetracaine  drops and 2% Xylocaine  jelly, augmented with 1% preservative-free intracameral lidocaine .    COMPLICATIONS:  None.   DESCRIPTION OF PROCEDURE:  The patient was identified in the holding room and transported to the operating room and placed in the supine position under the operating microscope.  The left eye was identified as the operative eye and it was prepped and draped in the usual sterile ophthalmic fashion.   A 1 millimeter clear-corneal paracentesis was made at the 1:30 position.  0.5 ml of preservative-free 1% lidocaine  was injected into the anterior chamber.  The anterior chamber was filled with Viscoat viscoelastic.  A 2.4 millimeter keratome was used to make a near-clear corneal incision at the 10:30 position.  .  A curvilinear capsulorrhexis was made with a cystotome and capsulorrhexis forceps.  Balanced salt solution was used to hydrodissect and hydrodelineate the nucleus.   Phacoemulsification was then used in stop and chop fashion to remove the lens nucleus and epinucleus.  The remaining cortex was then removed using the irrigation and aspiration handpiece. Provisc was then placed into the capsular bag to distend it for lens placement.  A lens was then injected into the capsular bag.  The  remaining viscoelastic was aspirated.   Wounds were hydrated with balanced salt solution.  The anterior chamber was inflated to a physiologic pressure with balanced salt solution.  No wound leaks were noted. Vigamox  0.2 ml of a 1mg  per ml solution was injected into the anterior chamber for a dose of 0.2 mg of intracameral antibiotic at the completion of the case.   Timolol  and Brimonidine  drops were applied to the eye.  The patient was taken to the recovery room in stable condition without complications of anesthesia or surgery.  [name] 09/20/2024, 9:07 AM

## 2024-09-20 NOTE — Transfer of Care (Signed)
 Immediate Anesthesia Transfer of Care Note  Patient: [name]  Procedure(s) Performed: PHACOEMULSIFICATION, CATARACT, WITH IOL INSERTION 3.26 00:24.8 (Left: Eye)  Patient Location: PACU  Anesthesia Type: MAC  Level of Consciousness: awake, alert  and patient cooperative  Airway and Oxygen Therapy: Patient Spontanous Breathing   Post-op Assessment: Post-op Vital signs reviewed, Patient's Cardiovascular Status Stable, Respiratory Function Stable, Patent Airway and No signs of Nausea or vomiting  Post-op Vital Signs: Reviewed and stable  Complications: No notable events documented.

## 2024-09-22 ENCOUNTER — Ambulatory Visit: Admitting: Internal Medicine

## 2024-09-22 ENCOUNTER — Ambulatory Visit

## 2024-09-22 ENCOUNTER — Other Ambulatory Visit

## 2024-10-27 ENCOUNTER — Inpatient Hospital Stay

## 2024-10-27 ENCOUNTER — Encounter: Payer: Self-pay | Admitting: Internal Medicine

## 2024-10-27 ENCOUNTER — Inpatient Hospital Stay: Attending: Internal Medicine

## 2024-10-27 ENCOUNTER — Inpatient Hospital Stay: Admitting: Internal Medicine

## 2024-10-27 DIAGNOSIS — D649 Anemia, unspecified: Secondary | ICD-10-CM

## 2024-10-27 DIAGNOSIS — D631 Anemia in chronic kidney disease: Secondary | ICD-10-CM | POA: Insufficient documentation

## 2024-10-27 DIAGNOSIS — D500 Iron deficiency anemia secondary to blood loss (chronic): Secondary | ICD-10-CM | POA: Diagnosis present

## 2024-10-27 DIAGNOSIS — Z801 Family history of malignant neoplasm of trachea, bronchus and lung: Secondary | ICD-10-CM | POA: Diagnosis not present

## 2024-10-27 DIAGNOSIS — N920 Excessive and frequent menstruation with regular cycle: Secondary | ICD-10-CM | POA: Insufficient documentation

## 2024-10-27 DIAGNOSIS — E559 Vitamin D deficiency, unspecified: Secondary | ICD-10-CM | POA: Diagnosis not present

## 2024-10-27 DIAGNOSIS — K909 Intestinal malabsorption, unspecified: Secondary | ICD-10-CM | POA: Diagnosis not present

## 2024-10-27 DIAGNOSIS — N183 Chronic kidney disease, stage 3 unspecified: Secondary | ICD-10-CM | POA: Insufficient documentation

## 2024-10-27 DIAGNOSIS — Z9884 Bariatric surgery status: Secondary | ICD-10-CM | POA: Insufficient documentation

## 2024-10-27 LAB — CBC WITH DIFFERENTIAL (CANCER CENTER ONLY)
Abs Immature Granulocytes: 0.01 K/uL (ref 0.00–0.07)
Basophils Absolute: 0.1 K/uL (ref 0.0–0.1)
Basophils Relative: 1 %
Eosinophils Absolute: 0.1 K/uL (ref 0.0–0.5)
Eosinophils Relative: 3 %
HCT: 37.9 % (ref 36.0–46.0)
Hemoglobin: 11.9 g/dL — ABNORMAL LOW (ref 12.0–15.0)
Immature Granulocytes: 0 %
Lymphocytes Relative: 33 %
Lymphs Abs: 1.3 K/uL (ref 0.7–4.0)
MCH: 30.6 pg (ref 26.0–34.0)
MCHC: 31.4 g/dL (ref 30.0–36.0)
MCV: 97.4 fL (ref 80.0–100.0)
Monocytes Absolute: 0.3 K/uL (ref 0.1–1.0)
Monocytes Relative: 9 %
Neutro Abs: 2 K/uL (ref 1.7–7.7)
Neutrophils Relative %: 54 %
Platelet Count: 233 K/uL (ref 150–400)
RBC: 3.89 MIL/uL (ref 3.87–5.11)
RDW: 13.9 % (ref 11.5–15.5)
WBC Count: 3.8 K/uL — ABNORMAL LOW (ref 4.0–10.5)
nRBC: 0 % (ref 0.0–0.2)

## 2024-10-27 LAB — BASIC METABOLIC PANEL WITH GFR
Anion gap: 8 (ref 5–15)
BUN: 24 mg/dL — ABNORMAL HIGH (ref 8–23)
CO2: 22 mmol/L (ref 22–32)
Calcium: 8.7 mg/dL — ABNORMAL LOW (ref 8.9–10.3)
Chloride: 107 mmol/L (ref 98–111)
Creatinine, Ser: 1.2 mg/dL — ABNORMAL HIGH (ref 0.44–1.00)
GFR, Estimated: 49 mL/min — ABNORMAL LOW (ref >60)
Glucose, Bld: 115 mg/dL — ABNORMAL HIGH (ref 70–99)
Potassium: 4 mmol/L (ref 3.5–5.1)
Sodium: 137 mmol/L (ref 135–145)

## 2024-10-27 LAB — VITAMIN D 25 HYDROXY (VIT D DEFICIENCY, FRACTURES): Vit D, 25-Hydroxy: 51.82 ng/mL (ref 30–100)

## 2024-10-27 NOTE — Assessment & Plan Note (Addendum)
#   Anemia- Hb-symptomatic.  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  Recommend compliance with barimelt+ iron  [over-the-counter medication/can buy online]. 2 a day- under the tongue.   # s/p Venofer - x4-continue maintenance Venofer .  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     # vit D def- on ergo weekly as nephrology; along with barimelt. Will repeat vit D levels.  # CKD- III-on advil [Dr.Korrapati]- stable.   # DISPOSITION: Friday preference # HOLD venofer  day pt pref # start venofer  next week-  # monthly venofer  x 3 more- # follow up 4  months - MD; labs- cbc/bmp;b12 ; vit D levels;  possible venofer - Dr.B

## 2024-10-27 NOTE — Progress Notes (Signed)
 [facility] CONSULT NOTE  Patient Care Team: Lenon Layman ORN, MD as PCP - General (Internal Medicine) Dessa, Reyes ORN, MD (General Surgery) Rennie Cindy SAUNDERS, MD as Consulting Physician (Oncology)  CHIEF COMPLAINTS/PURPOSE OF CONSULTATION: ANEMIA  2025 01/10/2024 01/10/2024              Ferritin 6 Low    -- -- 47 -- Load older lab results  Folate (Folic Acid ) -- -- 10.1 -- --   Iron  -- 25 Low    -- -- 12 Low      Total Iron  Binding Capacity (TIBC) -- 518.0 High    -- -- 256 Low      Percent Transferrin Saturation -- -- -- -- 5 Low      Transferrin -- 370.0 High    -- -- --   % Saturation -- 5         HEMATOLOGY HISTORY  # ANEMIA[Hb; MCV-platelets- WBC; Iron  sat; ferritin;  GFR- CT/US - ;   #  Symptomatic Iron  deficiency Anemia, patient intolerant of oral iron , also 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 (labs on 02/26/15 reported hemoglobin 10.4, MCV 77, platelets 258, WBC 6400, 64% neutrophils, 18% lymphocytes, 15% monocytes, 2% eosinophils, 1% basophils, serum iron  low at 20, TIBC 428, TIBC very low at 5%)   2. B12 deficiency - diagnosed June 2016.  HISTORY OF PRESENTING ILLNESS: Patient ambulating-independently.  Alone    [name] 68 y.o.  female pleasant patient with Hx of gastric by pass; and Hx of GIB of remnant ulcer [JAN 2025]; CKD stage III is  been referred to us  for further evaluation of anemia.  Patient has noted improvement on iron  infusions.  However her energy is not back to where she wants it to be.  No blood in stools or black or stools.  She is not very compliant with her sublingual iron  supplementation.   Review of Systems  Constitutional:  Positive for malaise/fatigue. Negative for chills, diaphoresis, fever and weight loss.  HENT:  Negative for nosebleeds and sore throat.   Eyes:  Negative for double vision.  Respiratory:  Negative for cough, hemoptysis, sputum production,  shortness of breath and wheezing.   Cardiovascular:  Negative for chest pain, palpitations, orthopnea and leg swelling.  Gastrointestinal:  Negative for abdominal pain, blood in stool, constipation, diarrhea, heartburn, melena, nausea and vomiting.  Genitourinary:  Negative for dysuria, frequency and urgency.  Musculoskeletal:  Negative for back pain and joint pain.  Skin: Negative.  Negative for itching and rash.  Neurological:  Negative for dizziness, tingling, focal weakness, weakness and headaches.  Endo/Heme/Allergies:  Does not bruise/bleed easily.  Psychiatric/Behavioral:  Negative for depression. The patient is not nervous/anxious and does not have insomnia.      MEDICAL HISTORY:  Past Medical History:  Diagnosis Date   Acute gastric ulcer with hemorrhage    Anemia    Arthritis    Bariatric surgery status    Chicken pox    Depression    Generalized anxiety disorder    Heartburn    History of blood transfusion    History of hemorrhoids    History of kidney stones    Hypertension    IDA (iron  deficiency anemia)    Kidney stones    Morbid obesity (HCC)    s/p Gastric bypass   Obesity (BMI 30-39.9)    S/P gastric bypass     SURGICAL HISTORY: Past Surgical History:  Procedure Laterality  Date   ABDOMINAL HYSTERECTOMY  1997   CATARACT EXTRACTION W/PHACO Right 08/30/2024   Procedure: PHACOEMULSIFICATION, CATARACT, WITH IOL INSERTION 2.85 00:29.9;  Surgeon: Mittie Gaskin, MD;  Location[facility];  Service: Ophthalmology;  Laterality: Right;   CATARACT EXTRACTION W/PHACO Left 09/20/2024   Procedure: PHACOEMULSIFICATION, CATARACT, WITH IOL INSERTION 3.26 00:24.8;  Surgeon: Mittie Gaskin, MD;  Location: [facility];  Service: Ophthalmology;  Laterality: Left;   CERVICAL SPINE SURGERY  2009   Fusion   CESAREAN SECTION  1987   CHOLECYSTECTOMY     COLONOSCOPY  04/22/2007   COLONOSCOPY WITH PROPOFOL  N/A 11/30/2017   Procedure: COLONOSCOPY WITH  PROPOFOL ;  Surgeon: Dessa Reyes ORN, MD;  Location: [facility];  Service: Endoscopy;  Laterality: N/A;   ESOPHAGOGASTRODUODENOSCOPY (EGD) WITH PROPOFOL  N/A 11/30/2017   Procedure: ESOPHAGOGASTRODUODENOSCOPY (EGD) WITH PROPOFOL ;  Surgeon: Dessa Reyes ORN, MD;  Location: [facility];  Service: Endoscopy;  Laterality: N/A;   ESOPHAGOGASTRODUODENOSCOPY (EGD) WITH PROPOFOL  N/A 01/06/2024   Procedure: ESOPHAGOGASTRODUODENOSCOPY (EGD) WITH PROPOFOL ;  Surgeon: Jinny Carmine, MD;  Location: [facility];  Service: Endoscopy;  Laterality: N/A;   GASTRIC BYPASS  2007   HERNIA REPAIR  2011   KNEE ARTHROSCOPY WITH MEDIAL MENISECTOMY Right 01/15/2022   Procedure: Right knee partial medial and lateral meniscectomies and loose body removal;  Surgeon: Kathlynn Sharper, MD;  Location: [facility];  Service: Orthopedics;  Laterality: Right;   OOPHORECTOMY     TONSILLECTOMY AND ADENOIDECTOMY     vulvar cysts     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    SOCIAL HISTORY: Social History   Socioeconomic History   Marital status: Widowed    Spouse name: Not on file   Number of children: Not on file   Years of education: Not on file   Highest education level: Not on file  Occupational History   Not on file  Tobacco Use   Smoking status: Never   Smokeless tobacco: Never  Vaping Use   Vaping status: Never Used  Substance and Sexual Activity   Alcohol use: Yes    Alcohol/week: 0.0 - 1.0 standard drinks of alcohol   Drug use: No   Sexual activity: Not Currently  Other Topics Concern   Not on file  Social History Narrative   Not on file   Social Drivers of Health   Financial Resource Strain: Low Risk  (10/16/2024)   Received [facility]   Overall Financial Resource Strain (CARDIA)    Difficulty of Paying Living Expenses: Not hard at all  Food Insecurity: No Food Insecurity (10/16/2024)   Received from [facility]   Hunger Vital Sign     Within the past 12 months, you worried that your food would run out before you got the money to buy more.: Never true    Within the past 12 months, the food you bought just didn't last and you didn't have money to get more.: Never true  Transportation Needs: No Transportation Needs (10/16/2024)   Received from [facility] - Transportation    In the past 12 months, has lack of transportation kept you from medical appointments or from getting medications?: No    Lack of Transportation (Non-Medical): No  Physical Activity: Not on file  Stress: Not on file  Social Connections: Moderately Isolated (01/03/2024)   Social Connection and Isolation Panel    Frequency of Communication with Friends and Family: More than three times a week  Frequency of Social Gatherings with Friends and Family: Twice a week    Attends Religious Services: 1 to 4 times per year    Active Member of [employer] or Organizations: No    Attends Banker Meetings: Never    Marital Status: Widowed  Intimate Partner Violence: Not At Risk (06/14/2024)   Humiliation, Afraid, Rape, and Kick questionnaire    Fear of Current or Ex-Partner: No    Emotionally Abused: No    Physically Abused: No    Sexually Abused: No    FAMILY HISTORY: Family History  Problem Relation Age of Onset   Arthritis Mother    Arthritis Father    Lung cancer Father    Arthritis Maternal Grandmother    Kidney disease Maternal Grandmother    Depression Maternal Grandmother    Breast cancer Sister 53       three times   Colon cancer Sister 51    ALLERGIES:  is allergic to keflex [cephalexin], penicillins, sulfa antibiotics, and tramadol.  MEDICATIONS:  Current Outpatient Medications  Medication Sig Dispense Refill   acetaminophen  (TYLENOL ) 500 MG tablet Take 500 mg by mouth every 6 (six) hours as needed.     buPROPion  (WELLBUTRIN  XL) 300 MG 24 hr tablet Take 1 tablet by mouth daily.     Cholecalciferol 1.25  MG (50000 UT) TABS Take by mouth.     gabapentin  (NEURONTIN ) 300 MG capsule Take 300 mg by mouth at bedtime as needed (pain).     losartan (COZAAR) 25 MG tablet Take 25 mg by mouth daily.     Multiple Vitamins-Iron  (MULTI-VITAMIN/IRON  PO) Take 1 tablet by mouth daily.     topiramate  (TOPAMAX ) 50 MG tablet Take 1 tablet by mouth 2 (two) times daily.     No current facility-administered medications for this visit.     PHYSICAL EXAMINATION:   Vitals:   10/27/24 1312 10/27/24 1326  BP: (!) 136/91 131/89  Pulse: 72   Resp: 15   Temp: (!) 95 F (35 C)   SpO2: 100%    Filed Weights   10/27/24 1312  Weight: 218 lb 12.8 oz (99.2 kg)    Physical Exam Vitals and nursing note reviewed.  HENT:     Head: Normocephalic and atraumatic.     Mouth/Throat:     Pharynx: Oropharynx is clear.  Eyes:     Extraocular Movements: Extraocular movements intact.     Pupils: Pupils are equal, round, and reactive to light.  Cardiovascular:     Rate and Rhythm: Normal rate and regular rhythm.  Pulmonary:     Comments: Decreased breath sounds bilaterally.  Abdominal:     Palpations: Abdomen is soft.  Musculoskeletal:        General: Normal range of motion.     Cervical back: Normal range of motion.  Skin:    General: Skin is warm.  Neurological:     General: No focal deficit present.     Mental Status: She is alert and oriented to person, place, and time.  Psychiatric:        Behavior: Behavior normal.        Judgment: Judgment normal.      LABORATORY DATA:  I have reviewed the data as listed Lab Results  Component Value Date   WBC 3.8 (L) 10/27/2024   HGB 11.9 (L) 10/27/2024   HCT 37.9 10/27/2024   MCV 97.4 10/27/2024   PLT 233 10/27/2024   Recent Labs    01/04/24 0604 01/05/24  0507 01/06/24 0433 10/27/24 1247  NA 140 139 135 137  K 4.1 3.8 4.0 4.0  CL 111 110 106 107  CO2 22 21* 23 22  GLUCOSE 105* 92 83 115*  BUN 27* 23 18 24*  CREATININE 1.04* 0.89 0.91 1.20*   CALCIUM 7.9* 8.1* 8.1* 8.7*  GFRNONAA 59* >60 >60 49*  PROT 5.3* 5.3* 5.7*  --   ALBUMIN 2.6* 2.5* 2.6*  --   AST 11* 10* 13*  --   ALT 7 6 8   --   ALKPHOS 52 53 83  --   BILITOT 0.5 0.7 0.9  --      No results found.  ASSESSMENT & PLAN:   Symptomatic anemia # Anemia- Hb-symptomatic.  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  Recommend compliance with barimelt+ iron  [over-the-counter medication/can buy online]. 2 a day- under the tongue.   # s/p Venofer - x4-continue maintenance Venofer .  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     # vit D def- on ergo weekly as nephrology; along with barimelt. Will repeat vit D levels.  # CKD- III-on advil [Dr.Korrapati]- stable.   # DISPOSITION: Friday preference # HOLD venofer  day pt pref # start venofer  next week-  # monthly venofer  x 3 more- # follow up 4  months - MD; labs- cbc/bmp;b12 ; vit D levels;  possible venofer - Dr.B    All questions were answered. The patient knows to call the clinic with any problems, questions or concerns.    Cindy JONELLE Joe, MD 10/29/2024 4:23 PM

## 2024-10-27 NOTE — Progress Notes (Signed)
 Patient has complaints of fatigue today, reports this is a daily issue.

## 2024-10-29 ENCOUNTER — Encounter: Payer: Self-pay | Admitting: Internal Medicine

## 2024-11-03 ENCOUNTER — Inpatient Hospital Stay

## 2024-11-03 VITALS — BP 115/70 | HR 80 | Temp 97.0°F

## 2024-11-03 DIAGNOSIS — N920 Excessive and frequent menstruation with regular cycle: Secondary | ICD-10-CM | POA: Diagnosis not present

## 2024-11-03 DIAGNOSIS — D508 Other iron deficiency anemias: Secondary | ICD-10-CM

## 2024-11-03 MED ORDER — IRON SUCROSE 20 MG/ML IV SOLN
200.0000 mg | Freq: Once | INTRAVENOUS | Status: AC
  Administered 2024-11-03: 200 mg via INTRAVENOUS
  Filled 2024-11-03: qty 10

## 2024-11-03 NOTE — Patient Instructions (Signed)

## 2024-12-29 ENCOUNTER — Inpatient Hospital Stay: Attending: Internal Medicine

## 2024-12-29 VITALS — BP 124/71 | HR 81 | Temp 97.6°F

## 2024-12-29 DIAGNOSIS — D500 Iron deficiency anemia secondary to blood loss (chronic): Secondary | ICD-10-CM | POA: Diagnosis present

## 2024-12-29 DIAGNOSIS — N920 Excessive and frequent menstruation with regular cycle: Secondary | ICD-10-CM | POA: Insufficient documentation

## 2024-12-29 DIAGNOSIS — D508 Other iron deficiency anemias: Secondary | ICD-10-CM

## 2024-12-29 MED ORDER — IRON SUCROSE 20 MG/ML IV SOLN
200.0000 mg | Freq: Once | INTRAVENOUS | Status: AC
  Administered 2024-12-29: 200 mg via INTRAVENOUS
  Filled 2024-12-29: qty 10

## 2025-01-26 ENCOUNTER — Inpatient Hospital Stay: Attending: Internal Medicine

## 2025-01-26 VITALS — BP 130/96 | HR 75 | Temp 95.0°F | Resp 19

## 2025-01-26 DIAGNOSIS — D508 Other iron deficiency anemias: Secondary | ICD-10-CM

## 2025-01-26 MED ORDER — SODIUM CHLORIDE 0.9% FLUSH
10.0000 mL | Freq: Once | INTRAVENOUS | Status: AC | PRN
  Administered 2025-01-26: 10 mL
  Filled 2025-01-26: qty 10

## 2025-01-26 MED ORDER — IRON SUCROSE 20 MG/ML IV SOLN
200.0000 mg | Freq: Once | INTRAVENOUS | Status: AC
  Administered 2025-01-26: 200 mg via INTRAVENOUS

## 2025-02-23 ENCOUNTER — Inpatient Hospital Stay

## 2025-02-23 ENCOUNTER — Inpatient Hospital Stay: Admitting: Internal Medicine
